# Patient Record
Sex: Female | Born: 1975 | Race: Black or African American | Hispanic: No | Marital: Single | Smoking: Current every day smoker
Health system: Southern US, Community
[De-identification: ages and names within clinical notes are randomized; demographics above are authoritative.]

## PROBLEM LIST (undated history)

## (undated) DIAGNOSIS — F99 Mental disorder, not otherwise specified: Secondary | ICD-10-CM

## (undated) DIAGNOSIS — F329 Major depressive disorder, single episode, unspecified: Secondary | ICD-10-CM

## (undated) DIAGNOSIS — I10 Essential (primary) hypertension: Secondary | ICD-10-CM

## (undated) DIAGNOSIS — F32A Depression, unspecified: Secondary | ICD-10-CM

## (undated) DIAGNOSIS — E7800 Pure hypercholesterolemia, unspecified: Secondary | ICD-10-CM

## (undated) DIAGNOSIS — E119 Type 2 diabetes mellitus without complications: Secondary | ICD-10-CM

## (undated) DIAGNOSIS — Z9119 Patient's noncompliance with other medical treatment and regimen: Secondary | ICD-10-CM

## (undated) DIAGNOSIS — Z91199 Patient's noncompliance with other medical treatment and regimen due to unspecified reason: Secondary | ICD-10-CM

---

## 2013-02-08 ENCOUNTER — Encounter (HOSPITAL_COMMUNITY): Payer: Self-pay | Admitting: *Deleted

## 2013-02-08 ENCOUNTER — Inpatient Hospital Stay (HOSPITAL_COMMUNITY)
Admission: AD | Admit: 2013-02-08 | Discharge: 2013-02-16 | DRG: 896 | Disposition: A | Discharge status: Home / Self Care | Payer: Self-pay | Source: Intra-hospital | Attending: Psychiatry | Admitting: Psychiatry

## 2013-02-08 ENCOUNTER — Emergency Department (HOSPITAL_COMMUNITY)
Admission: EM | Admit: 2013-02-08 | Discharge: 2013-02-08 | Disposition: A | Discharge status: Other Acute Inpatient Hospital | Payer: Self-pay | Attending: Emergency Medicine | Admitting: Emergency Medicine

## 2013-02-08 ENCOUNTER — Encounter (HOSPITAL_COMMUNITY): Payer: Self-pay | Admitting: Emergency Medicine

## 2013-02-08 DIAGNOSIS — F10939 Alcohol use, unspecified with withdrawal, unspecified: Principal | ICD-10-CM | POA: Diagnosis present

## 2013-02-08 DIAGNOSIS — Z5987 Material hardship due to limited financial resources, not elsewhere classified: Secondary | ICD-10-CM

## 2013-02-08 DIAGNOSIS — R4589 Other symptoms and signs involving emotional state: Secondary | ICD-10-CM | POA: Diagnosis present

## 2013-02-08 DIAGNOSIS — F17200 Nicotine dependence, unspecified, uncomplicated: Secondary | ICD-10-CM | POA: Insufficient documentation

## 2013-02-08 DIAGNOSIS — Z794 Long term (current) use of insulin: Secondary | ICD-10-CM | POA: Insufficient documentation

## 2013-02-08 DIAGNOSIS — F411 Generalized anxiety disorder: Secondary | ICD-10-CM | POA: Insufficient documentation

## 2013-02-08 DIAGNOSIS — F3289 Other specified depressive episodes: Secondary | ICD-10-CM | POA: Insufficient documentation

## 2013-02-08 DIAGNOSIS — I10 Essential (primary) hypertension: Secondary | ICD-10-CM | POA: Diagnosis present

## 2013-02-08 DIAGNOSIS — R450 Nervousness: Secondary | ICD-10-CM | POA: Insufficient documentation

## 2013-02-08 DIAGNOSIS — R079 Chest pain, unspecified: Secondary | ICD-10-CM | POA: Diagnosis present

## 2013-02-08 DIAGNOSIS — Z79899 Other long term (current) drug therapy: Secondary | ICD-10-CM | POA: Insufficient documentation

## 2013-02-08 DIAGNOSIS — Z598 Other problems related to housing and economic circumstances: Secondary | ICD-10-CM

## 2013-02-08 DIAGNOSIS — F1020 Alcohol dependence, uncomplicated: Secondary | ICD-10-CM | POA: Diagnosis present

## 2013-02-08 DIAGNOSIS — E1010 Type 1 diabetes mellitus with ketoacidosis without coma: Secondary | ICD-10-CM | POA: Diagnosis present

## 2013-02-08 DIAGNOSIS — R45851 Suicidal ideations: Secondary | ICD-10-CM | POA: Insufficient documentation

## 2013-02-08 DIAGNOSIS — R0602 Shortness of breath: Secondary | ICD-10-CM | POA: Insufficient documentation

## 2013-02-08 DIAGNOSIS — F332 Major depressive disorder, recurrent severe without psychotic features: Secondary | ICD-10-CM | POA: Diagnosis present

## 2013-02-08 DIAGNOSIS — F4310 Post-traumatic stress disorder, unspecified: Secondary | ICD-10-CM | POA: Diagnosis present

## 2013-02-08 DIAGNOSIS — F329 Major depressive disorder, single episode, unspecified: Secondary | ICD-10-CM | POA: Insufficient documentation

## 2013-02-08 DIAGNOSIS — F419 Anxiety disorder, unspecified: Secondary | ICD-10-CM | POA: Diagnosis present

## 2013-02-08 DIAGNOSIS — E119 Type 2 diabetes mellitus without complications: Secondary | ICD-10-CM | POA: Insufficient documentation

## 2013-02-08 DIAGNOSIS — F32A Depression, unspecified: Secondary | ICD-10-CM | POA: Diagnosis present

## 2013-02-08 DIAGNOSIS — F4312 Post-traumatic stress disorder, chronic: Secondary | ICD-10-CM | POA: Diagnosis present

## 2013-02-08 DIAGNOSIS — F609 Personality disorder, unspecified: Secondary | ICD-10-CM | POA: Diagnosis present

## 2013-02-08 DIAGNOSIS — R197 Diarrhea, unspecified: Secondary | ICD-10-CM | POA: Diagnosis present

## 2013-02-08 DIAGNOSIS — IMO0001 Reserved for inherently not codable concepts without codable children: Secondary | ICD-10-CM | POA: Insufficient documentation

## 2013-02-08 DIAGNOSIS — R0789 Other chest pain: Secondary | ICD-10-CM | POA: Insufficient documentation

## 2013-02-08 DIAGNOSIS — F10239 Alcohol dependence with withdrawal, unspecified: Principal | ICD-10-CM | POA: Diagnosis present

## 2013-02-08 DIAGNOSIS — R51 Headache: Secondary | ICD-10-CM | POA: Insufficient documentation

## 2013-02-08 DIAGNOSIS — F1994 Other psychoactive substance use, unspecified with psychoactive substance-induced mood disorder: Secondary | ICD-10-CM | POA: Diagnosis present

## 2013-02-08 DIAGNOSIS — N61 Mastitis without abscess: Secondary | ICD-10-CM | POA: Diagnosis present

## 2013-02-08 DIAGNOSIS — K044 Acute apical periodontitis of pulpal origin: Secondary | ICD-10-CM | POA: Diagnosis present

## 2013-02-08 DIAGNOSIS — G479 Sleep disorder, unspecified: Secondary | ICD-10-CM | POA: Insufficient documentation

## 2013-02-08 HISTORY — DX: Type 2 diabetes mellitus without complications: E11.9

## 2013-02-08 HISTORY — DX: Mental disorder, not otherwise specified: F99

## 2013-02-08 HISTORY — DX: Major depressive disorder, single episode, unspecified: F32.9

## 2013-02-08 HISTORY — DX: Depression, unspecified: F32.A

## 2013-02-08 HISTORY — DX: Essential (primary) hypertension: I10

## 2013-02-08 LAB — COMPREHENSIVE METABOLIC PANEL
ALT: 26 U/L (ref 0–35)
AST: 20 U/L (ref 0–37)
Albumin: 3.5 g/dL (ref 3.5–5.2)
Alkaline Phosphatase: 78 U/L (ref 39–117)
BUN: 5 mg/dL — ABNORMAL LOW (ref 6–23)
CO2: 20 mEq/L (ref 19–32)
Calcium: 9.3 mg/dL (ref 8.4–10.5)
Chloride: 101 mEq/L (ref 96–112)
Creatinine, Ser: 0.41 mg/dL — ABNORMAL LOW (ref 0.50–1.10)
GFR calc Af Amer: >90 mL/min (ref >90)
GFR calc non Af Amer: >90 mL/min (ref >90)
Glucose, Bld: 161 mg/dL — ABNORMAL HIGH (ref 70–99)
Potassium: 3.9 mEq/L (ref 3.5–5.1)
Sodium: 137 mEq/L (ref 135–145)
Total Bilirubin: <0.1 mg/dL — ABNORMAL LOW (ref 0.3–1.2)
Total Protein: 7.3 g/dL (ref 6.0–8.3)

## 2013-02-08 LAB — CBC WITH DIFFERENTIAL/PLATELET
Basophils Absolute: 0.1 10*3/uL (ref 0.0–0.1)
Basophils Relative: 2 % — ABNORMAL HIGH (ref 0–1)
Eosinophils Absolute: 0.2 10*3/uL (ref 0.0–0.7)
Eosinophils Relative: 3 % (ref 0–5)
HCT: 30.2 % — ABNORMAL LOW (ref 36.0–46.0)
Hemoglobin: 9.7 g/dL — ABNORMAL LOW (ref 12.0–15.0)
Lymphocytes Relative: 39 % (ref 12–46)
Lymphs Abs: 2.7 10*3/uL (ref 0.7–4.0)
MCH: 21.8 pg — ABNORMAL LOW (ref 26.0–34.0)
MCHC: 32.1 g/dL (ref 30.0–36.0)
MCV: 68 fL — ABNORMAL LOW (ref 78.0–100.0)
Monocytes Absolute: 0.5 10*3/uL (ref 0.1–1.0)
Monocytes Relative: 7 % (ref 3–12)
Neutro Abs: 3.3 10*3/uL (ref 1.7–7.7)
Neutrophils Relative %: 49 % (ref 43–77)
Platelets: 523 10*3/uL — ABNORMAL HIGH (ref 150–400)
RBC: 4.44 MIL/uL (ref 3.87–5.11)
RDW: 16.7 % — ABNORMAL HIGH (ref 11.5–15.5)
WBC: 6.8 10*3/uL (ref 4.0–10.5)

## 2013-02-08 LAB — ETHANOL: Alcohol, Ethyl (B): 96 mg/dL — ABNORMAL HIGH (ref 0–11)

## 2013-02-08 LAB — GLUCOSE, CAPILLARY
Glucose-Capillary: 156 mg/dL — ABNORMAL HIGH (ref 70–99)
Glucose-Capillary: 208 mg/dL — ABNORMAL HIGH (ref 70–99)
Glucose-Capillary: 278 mg/dL — ABNORMAL HIGH (ref 70–99)

## 2013-02-08 LAB — POCT I-STAT TROPONIN I: Troponin i, poc: 0.01 ng/mL (ref 0.00–0.08)

## 2013-02-08 MED ORDER — INSULIN GLARGINE 100 UNIT/ML ~~LOC~~ SOLN: 40.0000 [IU] | Freq: Two times a day (BID) | SUBCUTANEOUS | Status: DC

## 2013-02-08 MED ORDER — ONDANSETRON HCL 4 MG PO TABS: 4.0000 mg | ORAL_TABLET | Freq: Three times a day (TID) | ORAL | Status: DC | PRN

## 2013-02-08 MED ORDER — ONDANSETRON 4 MG PO TBDP: 4.0000 mg | ORAL_TABLET | Freq: Four times a day (QID) | ORAL | Status: AC | PRN

## 2013-02-08 MED ORDER — VITAMIN B-1 100 MG PO TABS
100.0000 mg | ORAL_TABLET | Freq: Every day | ORAL | Status: DC
  Administered 2013-02-09 – 2013-02-16 (×8): 100 mg via ORAL
  Filled 2013-02-08 (×9): qty 1

## 2013-02-08 MED ORDER — FLUOXETINE HCL 40 MG PO CAPS: 40.0000 mg | ORAL_CAPSULE | Freq: Every day | ORAL | Status: DC

## 2013-02-08 MED ORDER — CHLORDIAZEPOXIDE HCL 25 MG PO CAPS
50.0000 mg | ORAL_CAPSULE | Freq: Once | ORAL | Status: AC
  Administered 2013-02-08: 50 mg via ORAL
  Filled 2013-02-08: qty 2

## 2013-02-08 MED ORDER — CHLORDIAZEPOXIDE HCL 25 MG PO CAPS
25.0000 mg | ORAL_CAPSULE | Freq: Every day | ORAL | Status: AC
  Administered 2013-02-13: 25 mg via ORAL
  Filled 2013-02-08: qty 1

## 2013-02-08 MED ORDER — IBUPROFEN 600 MG PO TABS
600.0000 mg | ORAL_TABLET | ORAL | Status: DC | PRN
  Administered 2013-02-09: 600 mg via ORAL
  Filled 2013-02-08: qty 1

## 2013-02-08 MED ORDER — HYDROXYZINE HCL 25 MG PO TABS
25.0000 mg | ORAL_TABLET | Freq: Four times a day (QID) | ORAL | Status: AC | PRN
  Administered 2013-02-09 – 2013-02-11 (×6): 25 mg via ORAL

## 2013-02-08 MED ORDER — ZOLPIDEM TARTRATE 5 MG PO TABS: 5.0000 mg | ORAL_TABLET | Freq: Every evening | ORAL | Status: DC | PRN

## 2013-02-08 MED ORDER — INSULIN GLARGINE 100 UNIT/ML ~~LOC~~ SOLN
40.0000 [IU] | Freq: Every day | SUBCUTANEOUS | Status: DC
  Administered 2013-02-09 – 2013-02-13 (×4): 40 [IU] via SUBCUTANEOUS

## 2013-02-08 MED ORDER — SULFAMETHOXAZOLE-TMP DS 800-160 MG PO TABS
1.0000 | ORAL_TABLET | Freq: Two times a day (BID) | ORAL | Status: DC
Start: 2013-02-08 — End: 2013-02-09
  Administered 2013-02-08 – 2013-02-09 (×2): 1 via ORAL
  Filled 2013-02-08 (×7): qty 1

## 2013-02-08 MED ORDER — LOPERAMIDE HCL 2 MG PO CAPS
2.0000 mg | ORAL_CAPSULE | ORAL | Status: AC | PRN
  Administered 2013-02-09: 4 mg via ORAL
  Administered 2013-02-09: 2 mg via ORAL

## 2013-02-08 MED ORDER — LISINOPRIL 10 MG PO TABS
5.0000 mg | ORAL_TABLET | Freq: Every day | ORAL | Status: DC
  Administered 2013-02-08: 5 mg via ORAL
  Filled 2013-02-08: qty 0.5

## 2013-02-08 MED ORDER — CHLORDIAZEPOXIDE HCL 25 MG PO CAPS
25.0000 mg | ORAL_CAPSULE | Freq: Four times a day (QID) | ORAL | Status: AC
  Administered 2013-02-09 – 2013-02-10 (×6): 25 mg via ORAL
  Filled 2013-02-08 (×6): qty 1

## 2013-02-08 MED ORDER — CHLORDIAZEPOXIDE HCL 25 MG PO CAPS
25.0000 mg | ORAL_CAPSULE | Freq: Four times a day (QID) | ORAL | Status: AC | PRN
  Administered 2013-02-09 – 2013-02-11 (×4): 25 mg via ORAL
  Filled 2013-02-08 (×3): qty 1

## 2013-02-08 MED ORDER — CHLORDIAZEPOXIDE HCL 25 MG PO CAPS
25.0000 mg | ORAL_CAPSULE | Freq: Three times a day (TID) | ORAL | Status: AC
  Administered 2013-02-10 – 2013-02-11 (×3): 25 mg via ORAL
  Filled 2013-02-08 (×3): qty 1

## 2013-02-08 MED ORDER — TRAZODONE HCL 50 MG PO TABS
50.0000 mg | ORAL_TABLET | Freq: Every evening | ORAL | Status: DC | PRN
  Administered 2013-02-08 – 2013-02-09 (×2): 50 mg via ORAL
  Filled 2013-02-08 (×2): qty 1

## 2013-02-08 MED ORDER — INSULIN ASPART 100 UNIT/ML ~~LOC~~ SOLN
0.0000 [IU] | Freq: Three times a day (TID) | SUBCUTANEOUS | Status: DC
  Administered 2013-02-09: 11 [IU] via SUBCUTANEOUS
  Administered 2013-02-09: 4 [IU] via SUBCUTANEOUS
  Administered 2013-02-09: 11 [IU] via SUBCUTANEOUS
  Administered 2013-02-10: 7 [IU] via SUBCUTANEOUS
  Administered 2013-02-10: 11 [IU] via SUBCUTANEOUS
  Administered 2013-02-11: 3 [IU] via SUBCUTANEOUS
  Administered 2013-02-11: 15 [IU] via SUBCUTANEOUS
  Administered 2013-02-12: 11 [IU] via SUBCUTANEOUS
  Administered 2013-02-12: 7 [IU] via SUBCUTANEOUS
  Administered 2013-02-12: 3 [IU] via SUBCUTANEOUS
  Administered 2013-02-13: 10 [IU] via SUBCUTANEOUS
  Administered 2013-02-13 (×2): 4 [IU] via SUBCUTANEOUS
  Administered 2013-02-13 – 2013-02-14 (×2): 11 [IU] via SUBCUTANEOUS
  Administered 2013-02-14: 4 [IU] via SUBCUTANEOUS
  Administered 2013-02-14 – 2013-02-15 (×2): 11 [IU] via SUBCUTANEOUS
  Administered 2013-02-15: 15 [IU] via SUBCUTANEOUS
  Administered 2013-02-15: 11 [IU] via SUBCUTANEOUS
  Administered 2013-02-16: 7 [IU] via SUBCUTANEOUS
  Administered 2013-02-16: 11 [IU] via SUBCUTANEOUS
  Filled 2013-02-08: qty 0.2

## 2013-02-08 MED ORDER — GABAPENTIN 300 MG PO CAPS
900.0000 mg | ORAL_CAPSULE | Freq: Three times a day (TID) | ORAL | Status: DC
  Administered 2013-02-08 (×2): 900 mg via ORAL
  Filled 2013-02-08 (×3): qty 3

## 2013-02-08 MED ORDER — HYDROXYZINE HCL 25 MG PO TABS
50.0000 mg | ORAL_TABLET | Freq: Four times a day (QID) | ORAL | Status: DC | PRN
  Administered 2013-02-08: 50 mg via ORAL

## 2013-02-08 MED ORDER — MAGNESIUM HYDROXIDE 400 MG/5ML PO SUSP: 30.0000 mL | Freq: Every day | ORAL | Status: DC | PRN

## 2013-02-08 MED ORDER — HYDROXYZINE HCL 50 MG PO TABS
100.0000 mg | ORAL_TABLET | Freq: Two times a day (BID) | ORAL | Status: DC
  Administered 2013-02-08: 100 mg via ORAL
  Filled 2013-02-08 (×4): qty 2

## 2013-02-08 MED ORDER — INSULIN GLARGINE 100 UNIT/ML ~~LOC~~ SOLN
44.0000 [IU] | Freq: Every day | SUBCUTANEOUS | Status: DC
  Administered 2013-02-08 – 2013-02-15 (×8): 44 [IU] via SUBCUTANEOUS
  Filled 2013-02-08 (×2): qty 10

## 2013-02-08 MED ORDER — INSULIN GLARGINE 100 UNIT/ML ~~LOC~~ SOLN
44.0000 [IU] | Freq: Every day | SUBCUTANEOUS | Status: DC
  Filled 2013-02-08: qty 0.44

## 2013-02-08 MED ORDER — HYDROXYZINE HCL 50 MG PO TABS
50.0000 mg | ORAL_TABLET | ORAL | Status: DC | PRN
  Administered 2013-02-08: 50 mg via ORAL

## 2013-02-08 MED ORDER — GABAPENTIN 600 MG PO TABS: 900.0000 mg | ORAL_TABLET | Freq: Three times a day (TID) | ORAL | Status: DC

## 2013-02-08 MED ORDER — SULFAMETHOXAZOLE-TRIMETHOPRIM 800-160 MG PO TABS: 1.0000 | ORAL_TABLET | Freq: Two times a day (BID) | ORAL | Status: DC

## 2013-02-08 MED ORDER — ACETAMINOPHEN 325 MG PO TABS: 650.0000 mg | ORAL_TABLET | Freq: Four times a day (QID) | ORAL | Status: DC | PRN

## 2013-02-08 MED ORDER — LISINOPRIL 5 MG PO TABS
5.0000 mg | ORAL_TABLET | Freq: Every day | ORAL | Status: DC
  Administered 2013-02-09 – 2013-02-16 (×8): 5 mg via ORAL
  Filled 2013-02-08 (×9): qty 1

## 2013-02-08 MED ORDER — ALUM & MAG HYDROXIDE-SIMETH 200-200-20 MG/5ML PO SUSP
30.0000 mL | ORAL | Status: DC | PRN
  Administered 2013-02-11 – 2013-02-15 (×4): 30 mL via ORAL

## 2013-02-08 MED ORDER — THIAMINE HCL 100 MG/ML IJ SOLN: 100.0000 mg | Freq: Once | INTRAMUSCULAR | Status: DC

## 2013-02-08 MED ORDER — GABAPENTIN 300 MG PO CAPS
900.0000 mg | ORAL_CAPSULE | Freq: Three times a day (TID) | ORAL | Status: DC
  Administered 2013-02-08 – 2013-02-09 (×3): 900 mg via ORAL
  Filled 2013-02-08 (×8): qty 3

## 2013-02-08 MED ORDER — FLUOXETINE HCL 20 MG PO CAPS
40.0000 mg | ORAL_CAPSULE | Freq: Every day | ORAL | Status: AC
  Administered 2013-02-09 – 2013-02-15 (×7): 40 mg via ORAL
  Filled 2013-02-08 (×8): qty 2

## 2013-02-08 MED ORDER — ACETAMINOPHEN 325 MG PO TABS
650.0000 mg | ORAL_TABLET | ORAL | Status: DC | PRN
  Administered 2013-02-08: 650 mg via ORAL
  Filled 2013-02-08: qty 2

## 2013-02-08 MED ORDER — PNEUMOCOCCAL VAC POLYVALENT 25 MCG/0.5ML IJ INJ
0.5000 mL | INJECTION | INTRAMUSCULAR | Status: AC
  Administered 2013-02-09: 0.5 mL via INTRAMUSCULAR

## 2013-02-08 MED ORDER — ADULT MULTIVITAMIN W/MINERALS CH
1.0000 | ORAL_TABLET | Freq: Every day | ORAL | Status: DC
  Administered 2013-02-09 – 2013-02-16 (×8): 1 via ORAL
  Filled 2013-02-08 (×9): qty 1

## 2013-02-08 MED ORDER — INSULIN ASPART 100 UNIT/ML ~~LOC~~ SOLN: 40.0000 [IU] | Freq: Two times a day (BID) | SUBCUTANEOUS | Status: DC

## 2013-02-08 MED ORDER — NICOTINE 21 MG/24HR TD PT24
21.0000 mg | MEDICATED_PATCH | Freq: Every day | TRANSDERMAL | Status: DC
  Filled 2013-02-08: qty 14

## 2013-02-08 MED ORDER — METFORMIN HCL 500 MG PO TABS
1000.0000 mg | ORAL_TABLET | Freq: Two times a day (BID) | ORAL | Status: DC
  Administered 2013-02-09 – 2013-02-16 (×15): 1000 mg via ORAL
  Filled 2013-02-08 (×17): qty 2

## 2013-02-08 MED ORDER — METFORMIN HCL 500 MG PO TABS
1000.0000 mg | ORAL_TABLET | Freq: Two times a day (BID) | ORAL | Status: DC
  Administered 2013-02-08: 1000 mg via ORAL
  Filled 2013-02-08 (×2): qty 2

## 2013-02-08 MED ORDER — TRAMADOL HCL 50 MG PO TABS
50.0000 mg | ORAL_TABLET | Freq: Once | ORAL | Status: AC
  Administered 2013-02-08: 50 mg via ORAL
  Filled 2013-02-08: qty 1

## 2013-02-08 MED ORDER — CHLORDIAZEPOXIDE HCL 25 MG PO CAPS
25.0000 mg | ORAL_CAPSULE | ORAL | Status: AC
  Administered 2013-02-11 – 2013-02-12 (×2): 25 mg via ORAL
  Filled 2013-02-08 (×2): qty 1

## 2013-02-08 MED ORDER — INSULIN GLARGINE 100 UNIT/ML ~~LOC~~ SOLN
40.0000 [IU] | Freq: Every day | SUBCUTANEOUS | Status: DC
  Administered 2013-02-08: 40 [IU] via SUBCUTANEOUS
  Filled 2013-02-08: qty 0.4

## 2013-02-08 MED ORDER — LORAZEPAM 1 MG PO TABS
1.0000 mg | ORAL_TABLET | Freq: Three times a day (TID) | ORAL | Status: DC | PRN
  Administered 2013-02-08: 1 mg via ORAL
  Filled 2013-02-08: qty 1

## 2013-02-08 MED ORDER — FLUOXETINE HCL 20 MG PO CAPS
40.0000 mg | ORAL_CAPSULE | Freq: Every day | ORAL | Status: DC
  Administered 2013-02-08: 40 mg via ORAL
  Filled 2013-02-08 (×2): qty 2
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
## 2013-02-08 NOTE — Tx Team (Signed)
Initial Interdisciplinary Treatment Plan  PATIENT STRENGTHS: (choose at least two) Ability for insight Average or above average intelligence Capable of independent living Motivation for treatment/growth  PATIENT STRESSORS: Health problems Substance abuse   PROBLEM LIST: Problem List/Patient Goals Date to be addressed Date deferred Reason deferred Estimated date of resolution  Depression 02/08/13     Substance Abuse 02/08/13                                                DISCHARGE CRITERIA:  Ability to meet basic life and health needs Improved stabilization in mood, thinking, and/or behavior Motivation to continue treatment in a less acute level of care Withdrawal symptoms are absent or subacute and managed without 24-hour nursing intervention  PRELIMINARY DISCHARGE PLAN: Attend aftercare/continuing care group Placement in alternative living arrangements  PATIENT/FAMIILY INVOLVEMENT: This treatment plan has been presented to and reviewed with the patient, Carolyn Ayala, and/or family member, .  The patient and family have been given the opportunity to ask questions and make suggestions.  Chamaine Stankus, [place] 02/08/2013, 8:47 PM
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
## 2013-02-08 NOTE — ED Notes (Signed)
House Coverage aware of need for sitter.  

## 2013-02-08 NOTE — ED Notes (Addendum)
Tele Assessment completed at this time. Awaiting plan of care info from ACT team. Sitter remains at bedside.

## 2013-02-08 NOTE — Progress Notes (Signed)
Adult Psychoeducational Group Note  Date:  02/08/2013 Time:  10:00 PM  Group Topic/Focus:  NA  Participation Level:  Minimal  Participation Quality:  Appropriate  Affect:  Appropriate  Cognitive:  Alert  Insight: Appropriate  Engagement in Group:  Engaged  Modes of Intervention:  Discussion  Additional Comments:    Carolyn Ayala 02/08/2013, 10:00 PM

## 2013-02-08 NOTE — ED Notes (Signed)
Carb modified diet tray ordered.  

## 2013-02-08 NOTE — ED Provider Notes (Signed)
CSN: [id number]     Arrival date & time 02/08/13  0007 History   First MD Initiated Contact with Patient 02/08/13 0154     Chief Complaint  Patient presents with  . Chest Pain  . Suicidal   (Consider location/radiation/quality/duration/timing/severity/associated sxs/prior Treatment) HPI Comments: Patient with new diagnosis of diabetes, is very anxious, stating she doesn't know, what she's doing with this.  She also has suffered with depression for a while.  She's had no hospitalizations for depression.  Tonight, sense with vague chest discomfort, fleeting episode of shortness of breath.  Denies any nausea, diaphoresis at this, time.  She's complaining of headache, and increasing depression.  She, states she was switched from Zoloft and Wellbutrin to Prozac, but she does not feel that this is working.   He saw and within the last 1-2 months.  She has no local physician  Patient is a 37 y.o. female presenting with chest pain. The history is provided by the patient.  Chest Pain Pain location:  Substernal area Pain quality: dull   Pain radiates to:  Does not radiate Pain radiates to the back: no   Pain severity:  No pain Onset quality:  Unable to specify Timing:  Unable to specify Progression:  Unable to specify Chronicity:  Recurrent Relieved by:  Nothing Ineffective treatments:  None tried Associated symptoms: headache   Associated symptoms: no cough, no fever, no nausea, no shortness of breath and not vomiting     Past Medical History  Diagnosis Date  . Hypertension   . Diabetes mellitus without complication    Past Surgical History  Procedure Laterality Date  . Cesarean section     No family history on file. History  Substance Use Topics  . Smoking status: Current Every Day Smoker  . Smokeless tobacco: Not on file  . Alcohol Use: Yes   OB History   Grav Para Term Preterm Abortions TAB SAB Ect Mult Living                 Review of Systems  Constitutional: Negative for  fever and chills.  Respiratory: Negative for cough and shortness of breath.   Cardiovascular: Positive for chest pain.  Gastrointestinal: Negative for nausea and vomiting.  Genitourinary: Negative for dysuria.  Neurological: Positive for headaches.  Psychiatric/Behavioral: Positive for sleep disturbance. The patient is nervous/anxious.   All other systems reviewed and are negative.    Allergies  Review of patient's allergies indicates no known allergies.  Home Medications   Current Outpatient Rx  Name  Route  Sig  Dispense  Refill  . FLUoxetine (PROZAC) 40 MG capsule   Oral   Take 40 mg by mouth daily.         Marland Kitchen gabapentin (NEURONTIN) 600 MG tablet   Oral   Take 900 mg by mouth 3 (three) times daily.         . hydrOXYzine (VISTARIL) 100 MG capsule   Oral   Take 100 mg by mouth 2 (two) times daily.         . insulin glargine (LANTUS) 100 UNIT/ML injection   Subcutaneous   Inject 40-44 Units into the skin 2 (two) times daily. Injects 40 units every morning and injects 44 units every night at bedtime.         . insulin lispro (HUMALOG) 100 UNIT/ML injection   Subcutaneous   Inject into the skin 3 (three) times daily before meals.         Marland Kitchen lisinopril (  PRINIVIL,ZESTRIL) 5 MG tablet   Oral   Take 5 mg by mouth daily.         . metFORMIN (GLUCOPHAGE) 500 MG tablet   Oral   Take 1,000 mg by mouth 2 (two) times daily with a meal.         . traMADol (ULTRAM) 50 MG tablet   Oral   Take 50 mg by mouth every 6 (six) hours as needed for pain.          BP 118/84  Pulse 109  Temp(Src) 98.4 F (36.9 C) (Oral)  Resp 16  SpO2 100%  LMP 01/31/2013 Physical Exam  Nursing note and vitals reviewed. Constitutional: She is oriented to person, place, and time. She appears well-developed and well-nourished.  HENT:  Head: Normocephalic.  Eyes: Pupils are equal, round, and reactive to light.  Neck: Normal range of motion.  Cardiovascular: Normal rate and regular  rhythm.   Pulmonary/Chest: Effort normal and breath sounds normal.  Neurological: She is alert and oriented to person, place, and time.  Skin: Skin is warm.  Psychiatric: Her speech is normal and behavior is normal. Her mood appears anxious. Cognition and memory are normal. She expresses inappropriate judgment. She exhibits a depressed mood. She expresses suicidal ideation. She expresses no suicidal plans.    ED Course  Procedures (including critical care time) Labs Review Labs Reviewed  GLUCOSE, CAPILLARY - Abnormal; Notable for the following:    Glucose-Capillary 156 (*)    All other components within normal limits  ETHANOL - Abnormal; Notable for the following:    Alcohol, Ethyl (B) 96 (*)    All other components within normal limits  CBC WITH DIFFERENTIAL - Abnormal; Notable for the following:    Hemoglobin 9.7 (*)    HCT 30.2 (*)    MCV 68.0 (*)    MCH 21.8 (*)    RDW 16.7 (*)    Platelets 523 (*)    Basophils Relative 2 (*)    All other components within normal limits  COMPREHENSIVE METABOLIC PANEL - Abnormal; Notable for the following:    Glucose, Bld 161 (*)    BUN 5 (*)    Creatinine, Ser 0.41 (*)    Total Bilirubin <0.1 (*)    All other components within normal limits  URINE RAPID DRUG SCREEN (HOSP PERFORMED)  POCT I-STAT TROPONIN I   Imaging Review No results found.  MDM  No diagnosis found.   will have patient evaluated by psych   Arman Filter, NP 02/08/13 [phone]

## 2013-02-08 NOTE — ED Notes (Signed)
The pt c/o a headache med given

## 2013-02-08 NOTE — ED Provider Notes (Signed)
4:15 PM Pt accepted to [facility] by Dr. Dub Mikes. Pt continues to appear well. Will transfer.     Junius Argyle, MD 02/08/13 [phone]

## 2013-02-08 NOTE — ED Notes (Signed)
The pt is asleep and is hard to arrouse.  Angry when asked to get to the bed from the stretcher.  She was moved from pod d to rm 21 in pod c.  Not able to get very much information from here

## 2013-02-08 NOTE — ED Notes (Signed)
Patient states, " my father abandoned me when I was a child and I am having issues with it."  Patient also mentioned being newly diagnosed Diabetes and she is having trouble dealing with having to take insulin on daily basis.  Patient states, " don't want to take insulin for the rest of my life.  I was born here and this came here to die."  Patient mentions being admitted in a hospital recently for DKA.

## 2013-02-08 NOTE — ED Notes (Signed)
Pt is aware of the risks of not getting the EKG, pt still refused EKG

## 2013-02-08 NOTE — ED Notes (Signed)
PT. LEFT ER AFTER REGISTERING  STATED SHE WILL COME BACK AFTER GOING TO THE PARKING LOT.

## 2013-02-08 NOTE — BH Assessment (Signed)
[facility] Assessment Progress Note  Update:  Called [facility], spoke with Amy, pt's nurse @ [phone] and scheduled tele assessment for 0830.

## 2013-02-08 NOTE — BH Assessment (Signed)
[facility] Assessment Progress Note Update:  Pt accepted by Verne Spurr, PA, to Dr. Dub Mikes to bed at [facility] to bed 301-1.  Julieanne Cotton at [facility],  Updated as well as other Mining engineer.  Called pt's nurse, Melissa, @ [phone] to inform her.  [facility] staff to arrange transport to [facility] via security, as pt is voluntary.  [name] nurse is also going to update EDP Romeo Apple at [facility].

## 2013-02-08 NOTE — ED Provider Notes (Signed)
Medical screening examination/treatment/procedure(s) were performed by non-physician practitioner and as supervising physician I was immediately available for consultation/collaboration.  Sunnie Nielsen, MD 02/08/13 [phone]

## 2013-02-08 NOTE — ED Notes (Signed)
Tele-assessment ongoing at this time. 

## 2013-02-08 NOTE — ED Notes (Signed)
Pt up to the br sitter at there bedside

## 2013-02-08 NOTE — ED Notes (Signed)
PT. REPORTS MID CHEST PAIN ONSET THIS EVENING WITH SOB , DENIES NAUSEA OR DIAPHORESIS , PT. ALSO REPORTED SUICIDAL IDEATION -= PLANS TO OVER DOSE ON INSULIN . CHARGE NURSE NOTIFIED FOR SITTER . PAPER SCRUBS GIVEN TO PT. WILL CALL SECURITY TO WAND PT.

## 2013-02-08 NOTE — ED Notes (Signed)
Patient name called X2 w/ no answer.

## 2013-02-08 NOTE — ED Notes (Signed)
Patient received meal tray 

## 2013-02-08 NOTE — ED Notes (Signed)
CHARGE NURSE NOTIFIED BY NURSE FIRST FOR SITTER , PAPER SCRUBS GIVEN TO PT. SECURITY NOTIFIED TO WAND PT.

## 2013-02-09 DIAGNOSIS — F332 Major depressive disorder, recurrent severe without psychotic features: Secondary | ICD-10-CM

## 2013-02-09 DIAGNOSIS — F1994 Other psychoactive substance use, unspecified with psychoactive substance-induced mood disorder: Secondary | ICD-10-CM

## 2013-02-09 DIAGNOSIS — F4310 Post-traumatic stress disorder, unspecified: Secondary | ICD-10-CM

## 2013-02-09 DIAGNOSIS — F1010 Alcohol abuse, uncomplicated: Secondary | ICD-10-CM

## 2013-02-09 LAB — GLUCOSE, CAPILLARY
Glucose-Capillary: 277 mg/dL — ABNORMAL HIGH (ref 70–99)
Glucose-Capillary: 185 mg/dL — ABNORMAL HIGH (ref 70–99)
Glucose-Capillary: 300 mg/dL — ABNORMAL HIGH (ref 70–99)
Glucose-Capillary: 106 mg/dL — ABNORMAL HIGH (ref 70–99)

## 2013-02-09 LAB — HEMOGLOBIN A1C
Hgb A1c MFr Bld: 10 % — ABNORMAL HIGH (ref <5.7)
Mean Plasma Glucose: 240 mg/dL — ABNORMAL HIGH (ref <117)

## 2013-02-09 MED ORDER — HYDROCORTISONE 1 % EX CREA
TOPICAL_CREAM | Freq: Three times a day (TID) | CUTANEOUS | Status: DC
  Administered 2013-02-09 – 2013-02-10 (×3): 1 via TOPICAL
  Administered 2013-02-13 – 2013-02-16 (×6): via TOPICAL
  Filled 2013-02-09: qty 1.5
  Filled 2013-02-09: qty 28

## 2013-02-09 MED ORDER — GLUCERNA SHAKE PO LIQD
237.0000 mL | Freq: Three times a day (TID) | ORAL | Status: DC
  Administered 2013-02-09 – 2013-02-16 (×15): 237 mL via ORAL
  Filled 2013-02-09: qty 237

## 2013-02-09 MED ORDER — MIRTAZAPINE 15 MG PO TABS
15.0000 mg | ORAL_TABLET | Freq: Every day | ORAL | Status: DC
  Administered 2013-02-09 – 2013-02-10 (×2): 15 mg via ORAL
  Filled 2013-02-09 (×4): qty 1

## 2013-02-09 MED ORDER — GABAPENTIN 400 MG PO CAPS
1000.0000 mg | ORAL_CAPSULE | Freq: Three times a day (TID) | ORAL | Status: DC
  Administered 2013-02-09 – 2013-02-15 (×17): 1000 mg via ORAL
  Filled 2013-02-09 (×21): qty 2

## 2013-02-09 MED ORDER — AMOXICILLIN-POT CLAVULANATE 500-125 MG PO TABS
1.0000 | ORAL_TABLET | Freq: Two times a day (BID) | ORAL | Status: DC
  Administered 2013-02-09 – 2013-02-16 (×14): 500 mg via ORAL
  Filled 2013-02-09 (×8): qty 1
  Filled 2013-02-09: qty 8
  Filled 2013-02-09: qty 1
  Filled 2013-02-09: qty 8
  Filled 2013-02-09 (×6): qty 1

## 2013-02-09 MED ORDER — IBUPROFEN 600 MG PO TABS
600.0000 mg | ORAL_TABLET | Freq: Three times a day (TID) | ORAL | Status: DC
  Administered 2013-02-09 – 2013-02-10 (×4): 600 mg via ORAL
  Filled 2013-02-09 (×8): qty 1

## 2013-02-09 MED ORDER — DIPHENHYDRAMINE HCL 12.5 MG/5ML PO ELIX
12.5000 mg | ORAL_SOLUTION | Freq: Three times a day (TID) | ORAL | Status: DC | PRN
  Administered 2013-02-09 – 2013-02-14 (×8): 12.5 mg via ORAL
  Filled 2013-02-09 (×2): qty 5

## 2013-02-09 MED ORDER — BENZOCAINE 10 % MT GEL: Freq: Four times a day (QID) | OROMUCOSAL | Status: DC | PRN

## 2013-02-09 NOTE — H&P (Addendum)
  Pt was seen by me today and I agree with the key elements documented in H&P.    Will start augmentin for mastitis. Will increase neurontin for pain.

## 2013-02-09 NOTE — Progress Notes (Signed)
Patient ID: Carolyn Ayala, female   DOB: 1975-12-10, 37 y.o.   MRN: [id number] D)  Was rather irritable this evening after admission,  Multiple c/o's, feeling anxious, had gone all day taking only one ativan in the ED, began demanding something for her anxiety and detox.  Orders obtained and started on the librium protocol, stated her gums were sore and throbbing, refused tylenol, wanted tramadol or something stronger, but agreed to try motrin if order could be obtained. Became more pleasant once orders were obtained and librium protocol was started, with 50 mg loading dose. CBG was 208, had been eating cookies.  Asked if she was familiar with diet and nutrition with diabetes, stated she had never had any teaching.    Denies thoughts of self harm, able to contract for safety.   A)  Will continue to monitor for safety, continue POC, request consult for diabetic teaching R)  Safety maintained.   Addendum:  02:00  Pt woke up after being asleep and having a diarrhea stool in bed.   Bed was cleaned, pt took quick bath, imodium given as well as additional librium and repeat trazodone.  Pt requested clothing as she only had the paper scrubs she was wearing.  Some clothing obtained from the clothes closet, as pt refused to wear gowns.

## 2013-02-09 NOTE — Progress Notes (Signed)
D   Pt has been pleasant and appropriate  She was somewhat irritable first thing this morning but has calmed after taking detox medications   She reports feeling depressed since she has recently found out she has diabetes  She was eating ice cream for snack and it has been reported that she tends to eat lots of carbs  Pt said she does not know how to eat for her diabetes and requested information about the disease in general   She attended groups but mostly slept  She interacts well with others and can have loose boundaries at times   She has not had any more diarrhea A   Verbal support given  Medications administered and effectiveness monitored  Q 15 min checks  Provided handouts for pt regarding general information about her disease and dietary options   Ordered diabetes consult R   Pt safe at present
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
## 2013-02-09 NOTE — Progress Notes (Signed)
Adult Psychoeducational Group Note  Date:  02/09/2013 Time:  3:53 PM  Group Topic/Focus:  Healthy Communication:   The focus of this group is to discuss communication, barriers to communication, as well as healthy ways to communicate with others.  Participation Level:  Minimal  Participation Quality:  Attentive and Resistant  Affect:  Appropriate  Cognitive:  Appropriate  Insight: Improving  Engagement in Group:  Resistant  Modes of Intervention:  Activity and Discussion  Additional Comments:  Pt refused to participate in activity.   Tora Perches N 02/09/2013, 3:53 PM

## 2013-02-09 NOTE — [facility] Group Notes (Addendum)
[facility] Group Notes:  (Clinical Social Work)  02/09/2013     10-11AM  Summary of Progress/Problems:   The main focus of today's process group was for the patient to identify ways in which they have in the past sabotaged their own recovery. Motivational Interviewing was utilized to ask the group members what they get out of their substance use, and what reasons they may have for wanting to change.  The Stages of Change were explained using a handout, and patients identified where they currently are with regard to stages of change.  The patient expressed that she self sabotages with alcohol because she has such deep anger, and wants to forget her pain.  However, she believes that her 9yo son is suffering because of her addiction and she wants to get on medications and return to a normal life.  She is in the Preparation stage.  Type of Therapy:  Group Therapy - Process   Participation Level:  Active  Participation Quality:  Attentive and Sharing  Affect:  Appropriate  Cognitive:  Alert and Appropriate  Insight:  Developing/Improving  Engagement in Therapy:  Engaged  Modes of Intervention:  Education, Support and Processing, Motivational Interviewing  Ambrose Mantle, LCSW 02/09/2013, 12:27 PM
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
## 2013-02-09 NOTE — [facility] Group Notes (Signed)
[facility] Group Notes:  (Nursing/MHT/Case Management/Adjunct)  Date:  02/09/2013  Time:  1:06 PM  Type of Therapy:  Psychoeducational Skills  Participation Level:  Active  Participation Quality:  Appropriate  Affect:  Appropriate  Cognitive:  Appropriate  Insight:  Appropriate  Engagement in Group:  Engaged  Modes of Intervention:  Problem-solving  Summary of Progress/Problems: Pt attended self inventory group, and engaged in treatment. Pt reported in group that she was having a much better day, and that she had no concerns.   Jacquelyne Balint Shanta 02/09/2013, 1:06 PM
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
## 2013-02-09 NOTE — Progress Notes (Signed)
Psychoeducational Group Note  Date:  02/09/2013 Time:  0945 am  Group Topic/Focus:  Identifying Needs:   The focus of this group is to help patients identify their personal needs that have been historically problematic and identify healthy behaviors to address their needs.  Participation Level:  Minimal  Participation Quality:  Inattentive  Affect:  Flat  Cognitive:  Alert  Insight:  Limited  Engagement in Group:  Limited  Additional Comments:    Andrena Mews 02/09/2013,3:46 PM

## 2013-02-10 DIAGNOSIS — F331 Major depressive disorder, recurrent, moderate: Secondary | ICD-10-CM

## 2013-02-10 DIAGNOSIS — F411 Generalized anxiety disorder: Secondary | ICD-10-CM

## 2013-02-10 LAB — GLUCOSE, CAPILLARY
Glucose-Capillary: 217 mg/dL — ABNORMAL HIGH (ref 70–99)
Glucose-Capillary: 94 mg/dL (ref 70–99)
Glucose-Capillary: 283 mg/dL — ABNORMAL HIGH (ref 70–99)
Glucose-Capillary: 82 mg/dL (ref 70–99)

## 2013-02-10 MED ORDER — IBUPROFEN 800 MG PO TABS
800.0000 mg | ORAL_TABLET | Freq: Three times a day (TID) | ORAL | Status: DC
  Administered 2013-02-10 – 2013-02-16 (×19): 800 mg via ORAL
  Filled 2013-02-10 (×23): qty 1

## 2013-02-10 MED ORDER — NALTREXONE HCL 50 MG PO TABS
50.0000 mg | ORAL_TABLET | Freq: Every day | ORAL | Status: DC
  Administered 2013-02-10 – 2013-02-16 (×6): 50 mg via ORAL
  Filled 2013-02-10 (×6): qty 1
  Filled 2013-02-10: qty 14
  Filled 2013-02-10 (×2): qty 1

## 2013-02-10 NOTE — Progress Notes (Signed)
[facility] Group Notes:  (Nursing/MHT/Case Management/Adjunct)  Date:  02/09/2013   Time:  2100 Type of Therapy:  wrap up group  Participation Level:  Minimal  Participation Quality:  Appropriate and Supportive  Affect:  Appropriate  Cognitive:  Appropriate  Insight:  Appropriate  Engagement in Group:  Limited  Modes of Intervention:  Clarification, Education and Support  Summary of Progress/Problems:  Carolyn Ayala 02/10/2013, 3:37 AM

## 2013-02-10 NOTE — Progress Notes (Signed)
Patient ID: Carolyn Ayala, female   DOB: [birth year], 37 y.o.   MRN: [id number] D: Patient complaining of withdrawal symptoms.  Patient was admin. Librium prn X1.  She continues to have mouth pain due to missing teeth.  Patient has been pleasant and cooperative.  She exhibits med seeking behavior. She denies any SI/HI/AVH. A: Continue to monitor medication management and MD orders.  Safety checks completed every 15 minutes per protocol. R: Patient's behavior has been appropriate.
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
## 2013-02-10 NOTE — [facility] Group Notes (Signed)
[facility] Group Notes:  (Clinical Social Work)  02/10/2013  10:00-11:00AM  Summary of Progress/Problems:   The main focus of today's process group was to   identify the patient's current support system and decide on other supports that can be put in place.  The picture on workbook was used to discuss why additional supports are needed, and a hand-out was distributed with four definitions/levels of support, then used to talk about how patients have given and received all different kinds of support.  An emphasis was placed on using counselor, doctor, therapy groups, 12-step groups, and problem-specific support groups to expand supports.  The patient was present in group but did not contribute to discussion, appeared to not be listening, and even talked out loud as though to the door at one point.  Type of Therapy:  Process Group with Motivational Interviewing  Participation Level:  Minimal  Participation Quality:  Inattentive  Affect:  Depressed and Flat  Cognitive:  Alert  Insight:  Lacking  Engagement in Therapy:  Lacking  Modes of Intervention:   Education, Support and Processing, Activity  Ambrose Mantle, LCSW 02/10/2013, 12:59 PM

## 2013-02-10 NOTE — Progress Notes (Signed)
[facility] MD Progress Note  02/10/2013 10:12 AM Carolyn Ayala  MRN:  [id number] Subjective:  Carolyn Ayala reports that she is feeling much better today as she was able to sleep through the night on Remeron. She endorses continued withdrawal symptoms including tremors, as well as cravings for alcohol. She is willing to take a medication to help reduce cravings. She denies any suicidal or homicidal ideation. She denies any auditory or visual hallucinations.  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 Hypertension   . Diabetes mellitus without complication   . Mental disorder   . Depression     ADL's:  Intact  Sleep: Good  Appetite:  Good  Suicidal Ideation:  Patient denies any thought, plan, or intent Homicidal Ideation:  Patient denies any thought, plan, or intent AEB (as evidenced by):  Psychiatric Specialty Exam: Review of Systems  Constitutional: Negative.   HENT: Negative.   Eyes: Negative.   Respiratory: Negative.   Cardiovascular: Negative.   Gastrointestinal: Negative.   Genitourinary: Negative.   Musculoskeletal: Negative.   Neurological: Negative.   Endo/Heme/Allergies: Negative.     Blood pressure 115/78, pulse 111, temperature 97.8 F (36.6 C), temperature source Oral, resp. rate 18, height 5\' 2"  (1.575 m), weight 71.215 kg (157 lb), last menstrual period 01/31/2013, SpO2 100.00%.Body mass index is 28.71 kg/(m^2).  General Appearance: Casual  Eye Contact::  Good  Speech:  Clear and Coherent  Volume:  Normal  Mood:   Anxious and Dysphoric  Affect:  Congruent  Thought Process:  Linear  Orientation:  Full (Time, Place, and Person)  Thought Content:  WDL  Suicidal Thoughts:  No  Homicidal Thoughts:  No  Memory:  Immediate;   Fair Recent;   Fair Remote;   Fair  Judgement:  Impaired  Insight:  Lacking  Psychomotor Activity:  Normal  Concentration:  Good  Recall:  Good  Akathisia:  No  Handed:    AIMS (if indicated):     Assets:  Communication Skills Desire for Improvement  Sleep:  Number of Hours: 5   Current Medications: Current Facility-Administered Medications  Medication Dose Route Frequency Provider Last Rate Last Dose  . acetaminophen (TYLENOL) tablet 650 mg  650 mg Oral Q6H PRN Court Joy, PA-C      . alum & mag hydroxide-simeth (MAALOX/MYLANTA) 200-200-20 MG/5ML suspension 30 mL  30 mL Oral Q4H PRN Court Joy, PA-C      . amoxicillin-clavulanate (AUGMENTIN) 500-125 MG per tablet 500 mg  1 tablet Oral BID [name], MD   500 mg at 02/10/13 0753  . benzocaine (ORAJEL) 10 % mucosal gel   Mouth/Throat QID PRN Jorje Guild, PA-C      . chlordiazePOXIDE (LIBRIUM) capsule 25 mg  25 mg Oral Q6H PRN Court Joy, PA-C   25 mg at 02/10/13 1005  . chlordiazePOXIDE (LIBRIUM) capsule 25 mg  25 mg Oral TID Court Joy, PA-C       Followed by  . [START ON 02/11/2013] chlordiazePOXIDE (LIBRIUM) capsule 25 mg  25 mg Oral BH-qamhs Court Joy, PA-C       Followed by  . [START ON  02/13/2013] chlordiazePOXIDE (LIBRIUM) capsule 25 mg  25 mg Oral Daily Court Joy, PA-C      . diphenhydrAMINE (BENADRYL) 12.5 MG/5ML elixir 12.5 mg  12.5 mg Oral Q8H PRN Jorje Guild, PA-C   12.5 mg at 02/09/13 2321  . feeding supplement (GLUCERNA SHAKE) liquid 237 mL  237 mL Oral TID BM Jorje Guild, PA-C   237 mL at 02/10/13 1006  . FLUoxetine (PROZAC) capsule 40 mg  40 mg Oral Daily Court Joy, PA-C   40 mg at 02/10/13 0754  . gabapentin (NEURONTIN) capsule 1,000 mg  1,000 mg Oral TID [name], MD   1,000  mg at 02/10/13 0753  . hydrocortisone cream 1 %   Topical TID Jorje Guild, PA-C   1 application at 02/09/13 1715  . hydrOXYzine (ATARAX/VISTARIL) tablet 25 mg  25 mg Oral Q6H PRN Court Joy, PA-C   25 mg at 02/10/13 0755  . ibuprofen (ADVIL,MOTRIN) tablet 800 mg  800 mg Oral TID Jorje Guild, PA-C      . insulin aspart (novoLOG) injection 0-20 Units  0-20 Units Subcutaneous TID WC Court Joy, PA-C   7 Units at 02/10/13 0703  . insulin glargine (LANTUS) injection 40 Units  40 Units Subcutaneous Daily Court Joy, PA-C   40 Units at 02/10/13 0801   And  . insulin glargine (LANTUS) injection 44 Units  44 Units Subcutaneous QHS Court Joy, PA-C   44 Units at 02/09/13 2315  . lisinopril (PRINIVIL,ZESTRIL) tablet 5 mg  5 mg Oral Daily Court Joy, PA-C   5 mg at 02/10/13 0754  . loperamide (IMODIUM) capsule 2-4 mg  2-4 mg Oral PRN Court Joy, PA-C   2 mg at 02/09/13 1610  . magnesium hydroxide (MILK OF MAGNESIA) suspension 30 mL  30 mL Oral Daily PRN Court Joy, PA-C      . metFORMIN (GLUCOPHAGE) tablet 1,000 mg  1,000 mg Oral BID WC Court Joy, PA-C   1,000 mg at 02/10/13 0754  . mirtazapine (REMERON) tablet 15 mg  15 mg Oral QHS Jorje Guild, PA-C   15 mg at 02/09/13 2113  . multivitamin with minerals tablet 1 tablet  1 tablet Oral Daily Court Joy, PA-C   1 tablet at 02/10/13 9604  . naltrexone (DEPADE) tablet 50 mg  50 mg Oral Daily Jorje Guild, PA-C      . ondansetron (ZOFRAN-ODT) disintegrating tablet 4 mg  4 mg Oral Q6H PRN Court Joy, PA-C      . thiamine (B-1) injection 100 mg  100 mg Intramuscular Once Court Joy, PA-C      . thiamine (VITAMIN B-1) tablet 100 mg  100 mg Oral Daily Court Joy, PA-C   100 mg at 02/10/13 5409    Lab Results:  Results for orders placed during the hospital encounter of 02/08/13 (from the past 48 hour(s))  GLUCOSE, CAPILLARY     Status: Abnormal   Collection Time    02/08/13 10:36 PM      Result Value Range    Glucose-Capillary 208 (*) 70 - 99 mg/dL  GLUCOSE, CAPILLARY     Status: Abnormal   Collection Time    02/09/13  6:29 AM      Result Value Range   Glucose-Capillary 277 (*) 70 - 99 mg/dL  HEMOGLOBIN W1X     Status: Abnormal   Collection Time    02/09/13  6:30 AM      Result  Value Range   Hemoglobin A1C 10.0 (*) <5.7 %   Comment: (NOTE)                                                                               According to the ADA Clinical Practice Recommendations for 2011, when     HbA1c is used as a screening test:      >=6.5%   Diagnostic of Diabetes Mellitus               (if abnormal result is confirmed)     5.7-6.4%   Increased risk of developing Diabetes Mellitus     References:Diagnosis and Classification of Diabetes Mellitus,Diabetes     Care,2011,34(Suppl 1):S62-S69 and Standards of Medical Care in             Diabetes - 2011,Diabetes Care,2011,34 (Suppl 1):S11-S61.   Mean Plasma Glucose 240 (*) <117 mg/dL   Comment: Performed at [facility]  GLUCOSE, CAPILLARY     Status: Abnormal   Collection Time    02/09/13 11:57 AM      Result Value Range   Glucose-Capillary 185 (*) 70 - 99 mg/dL   Comment 1 Notify RN    GLUCOSE, CAPILLARY     Status: Abnormal   Collection Time    02/09/13  4:43 PM      Result Value Range   Glucose-Capillary 300 (*) 70 - 99 mg/dL   Comment 1 Notify RN     Comment 2 Documented in Chart    GLUCOSE, CAPILLARY     Status: Abnormal   Collection Time    02/09/13  9:32 PM      Result Value Range   Glucose-Capillary 106 (*) 70 - 99 mg/dL   Comment 1 Notify RN     Comment 2 Documented in Chart      Physical Findings: AIMS: Facial and Oral Movements Muscles of Facial Expression: None, normal Lips and Perioral Area: None, normal Jaw: None, normal Tongue: None, normal,Extremity Movements Upper (arms, wrists, hands, fingers): None, normal Lower (legs, knees, ankles, toes): None, normal, Trunk Movements Neck, shoulders, hips: None,  normal, Overall Severity Severity of abnormal movements (highest score from questions above): None, normal Incapacitation due to abnormal movements: None, normal Patient's awareness of abnormal movements (rate only patient's report): No Awareness, Dental Status Current problems with teeth and/or dentures?: Yes Does patient usually wear dentures?: No  CIWA:  CIWA-Ar Total: 11 COWS:     Treatment Plan Summary: Daily contact with patient to assess and evaluate symptoms and progress in treatment Medication management  Plan: We will continue her current plan of care and initiate naltrexone 50 mg daily to target her cravings for alcohol. She has a pending diabetic consult to discuss diet.  Medical Decision Making Problem Points:  Established problem, stable/improving (1) and Review of psycho-social stressors (1) Data Points:  Review or order clinical lab tests (1) Review of medication regiment & side effects (2) Review of new medications or change in dosage (2)  I certify that inpatient services furnished can reasonably be expected to improve the patient's condition.   Claxton Levitz 02/10/2013, 10:12 AM

## 2013-02-10 NOTE — Progress Notes (Signed)
NUTRITION ASSESSMENT  Pt identified as at risk on the Malnutrition Screen Tool  INTERVENTION: 1. Educated patient on the importance of nutrition and encouraged intake of food and beverages.   2. Discussed weight goals. 3. Supplements: MVI and thiamine daily, Glucerna shake tid 4.  Educated patient on DM diet and Consistent carbs.  Provided information on the plate method and cho counting.  Patient able to verbalize.  Teach back method used.  Patient would benefit from outpatient DM class.  NUTRITION DIAGNOSIS: Food and nutrition related knowledge deficit related to no prior need AEB newly dx diabetes.  Goal: Patient to verbalize guidelines for diabetic diet.  Monitor:  For questions.  Assessment:  Patient admitted with SI with plan to OD on her insulin. MDD, Personality disorder, PTSD.   Drinks 8 40 oz beers daily.  Separated, lives with niece, unemployed.    Met with patient who is overwhelmed with DM and other issues in her life.  Discussed how she took her insulin at home.  At another point in the conversation patient stated that someone told her that if she ate better and exercised a lot she could quit taking her insulin and diabetes meds.  Patient verbalized how sick she was on admit with new DM.  Then after discussion patient verbalized that she needed to continue taking all of her meds until a point that an MD tells her that she does not need to.    Fair intake of meals currently.   37 y.o. female  Height: Ht Readings from Last 1 Encounters:  02/08/13 5\' 2"  (1.575 m)    Weight: Wt Readings from Last 1 Encounters:  02/08/13 157 lb (71.215 kg)    Weight Hx: Wt Readings from Last 10 Encounters:  02/08/13 157 lb (71.215 kg)    BMI:  Body mass index is 28.71 kg/(m^2). Pt meets criteria for overweight based on current BMI.  Estimated Nutritional Needs: Kcal: 25-30 kcal/kg Protein: > 1 gram protein/kg Fluid: 1 ml/kcal  Diet Order: Carb Control Pt is also offered  choice of unit snacks mid-morning and mid-afternoon.  Pt is eating as desired.    Lab results and medications reviewed.   Oran Rein, RD, LDN Clinical Inpatient Dietitian Pager:  [phone] Weekend and after hours pager:  [phone]

## 2013-02-10 NOTE — [facility] Group Notes (Signed)
[facility] Group Notes:  (Nursing/MHT/Case Management/Adjunct)  Date:  02/10/2013  Time:  10:29 AM  Type of Therapy: Psychoeducational Skills  Participation Level: Active  Participation Quality: Appropriate  Affect: Appropriate  Cognitive: Appropriate  Insight: Appropriate  Engagement in Group: Engaged  Modes of Intervention: Problem-solving  Summary of Progress/Problems: Pt attended self inventory group, and engaged in treatment.  Avalie Oconnor Shanta 02/10/2013, 10:29 AM 

## 2013-02-10 NOTE — Progress Notes (Signed)
Patient ID: Carolyn Ayala, female   DOB: 07-10-1975, 37 y.o.   MRN: [id number] D)  Has been up to the med window several times this evening, med seeking, even though she seems drowsy.  Was eating corn chips and other snacks, offered her a salad instead, which she accepted.  Has been arguing with roommate, both of them seem somewhat irritable.  Pt had wanted to sleep in quiet room but someone was already sleeping there, went back to her room and trying to avoid interaction.  States is doing a little better with detox,  still feeling anxious, but no further diarrhea this evening.  Pain in gums seems to be usually around a 5 today, improving,  Antibiotic was changed to augmentin, order was obtained to d/c bactrim as pt had blamed it for the problems last night with diarrhea.  Hoping to get a better night's sleep with remeron tonight. A)  Will continue to monitor for safety, continue POC  R)  Safety maintained.

## 2013-02-11 DIAGNOSIS — F39 Unspecified mood [affective] disorder: Secondary | ICD-10-CM

## 2013-02-11 DIAGNOSIS — F411 Generalized anxiety disorder: Secondary | ICD-10-CM

## 2013-02-11 LAB — GLUCOSE, CAPILLARY
Glucose-Capillary: 71 mg/dL (ref 70–99)
Glucose-Capillary: 141 mg/dL — ABNORMAL HIGH (ref 70–99)
Glucose-Capillary: 342 mg/dL — ABNORMAL HIGH (ref 70–99)
Glucose-Capillary: 186 mg/dL — ABNORMAL HIGH (ref 70–99)

## 2013-02-11 MED ORDER — MIRTAZAPINE 30 MG PO TABS
30.0000 mg | ORAL_TABLET | Freq: Every day | ORAL | Status: DC
  Administered 2013-02-11 – 2013-02-15 (×5): 30 mg via ORAL
  Filled 2013-02-11 (×3): qty 1
  Filled 2013-02-11: qty 14
  Filled 2013-02-11 (×2): qty 1

## 2013-02-11 MED ORDER — HYDROCERIN EX CREA
TOPICAL_CREAM | Freq: Two times a day (BID) | CUTANEOUS | Status: DC
  Administered 2013-02-11 – 2013-02-16 (×4): via TOPICAL
  Filled 2013-02-11 (×2): qty 113

## 2013-02-11 NOTE — Progress Notes (Signed)
Patient ID: Carolyn Ayala, female   DOB: 1975/09/03, 37 y.o.   MRN: [id number] [facility] has been up and to group at times she is disruptive verbally in group and to peers. Peers do not want to be around her. She denies withdrawal symptoms. She refused her 8AM insulin d/t cbg being low.

## 2013-02-11 NOTE — Progress Notes (Signed)
Patient did attend the first half of the evening speaker AA meeting.  

## 2013-02-11 NOTE — Progress Notes (Signed)
Adult Psychoeducational Group Note  Date:  02/11/2013 Time:  6:35 PM  Group Topic/Focus:  Dimensions of Wellness:   The focus of this group is to introduce the topic of wellness and discuss the role each dimension of wellness plays in total health.  Participation Level:  Minimal  Participation Quality:  Attentive  Affect:  Appropriate  Cognitive:  Oriented  Insight: Lacking  Engagement in Group:  Improving  Modes of Intervention:  Education, Socialization and Support  Additional Comments:    Reynolds Bowl 02/11/2013, 6:35 PM
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
## 2013-02-12 LAB — GLUCOSE, CAPILLARY
Glucose-Capillary: 223 mg/dL — ABNORMAL HIGH (ref 70–99)
Glucose-Capillary: 131 mg/dL — ABNORMAL HIGH (ref 70–99)
Glucose-Capillary: 260 mg/dL — ABNORMAL HIGH (ref 70–99)
Glucose-Capillary: 232 mg/dL — ABNORMAL HIGH (ref 70–99)
Glucose-Capillary: 405 mg/dL — ABNORMAL HIGH (ref 70–99)

## 2013-02-12 MED ORDER — BACITRACIN-NEOMYCIN-POLYMYXIN OINTMENT TUBE
TOPICAL_OINTMENT | Freq: Two times a day (BID) | CUTANEOUS | Status: DC
  Administered 2013-02-13 – 2013-02-16 (×4): via TOPICAL
  Filled 2013-02-12: qty 15

## 2013-02-12 MED ORDER — HYDROXYZINE HCL 50 MG PO TABS
50.0000 mg | ORAL_TABLET | Freq: Four times a day (QID) | ORAL | Status: DC | PRN
  Administered 2013-02-12 – 2013-02-13 (×2): 50 mg via ORAL

## 2013-02-12 MED ORDER — INSULIN ASPART 100 UNIT/ML ~~LOC~~ SOLN
4.0000 [IU] | Freq: Three times a day (TID) | SUBCUTANEOUS | Status: DC
  Administered 2013-02-12 – 2013-02-16 (×12): 4 [IU] via SUBCUTANEOUS

## 2013-02-12 MED ORDER — INSULIN ASPART 100 UNIT/ML ~~LOC~~ SOLN
10.0000 [IU] | Freq: Once | SUBCUTANEOUS | Status: AC
  Administered 2013-02-12: 10 [IU] via SUBCUTANEOUS

## 2013-02-12 MED ORDER — HYDROXYZINE HCL 25 MG PO TABS
25.0000 mg | ORAL_TABLET | Freq: Four times a day (QID) | ORAL | Status: DC | PRN
  Administered 2013-02-12 (×2): 25 mg via ORAL

## 2013-02-12 NOTE — Progress Notes (Signed)
The focus of this group is to educate the patient on the purpose and policies of crisis stabilization and provide a format to answer questions about their admission.  The group details unit policies and expectations of patients while admitted. Patient attended the 0900 group and was engaging.

## 2013-02-12 NOTE — Progress Notes (Signed)
Patient resting quietly with eyes closed. Respirations even and unlabored. No distress noted, Q 15 minute check continues as ordered to maintain safety.  

## 2013-02-12 NOTE — Progress Notes (Signed)
Adult Psychoeducational Group Note  Date:  02/12/2013 Time:  2:36 PM  Group Topic/Focus:  Recovery Goals:   The focus of this group is to identify appropriate goals for recovery and establish a plan to achieve them.  Participation Level:  Did Not Attend  Cathlean Cower 02/12/2013, 2:36 PM

## 2013-02-12 NOTE — Progress Notes (Signed)
D:  Patient up and visible in the milieu most of the shift.  Has attended some groups, but is not always able to participate or pay attention to what is going on.  She has been argumentative and intrusive at times, especially during group time.  She continues to endorse high levels of depression and hopelessness.  She also endorses intermittent suicidal thoughts.  She is able to commit to seek out staff if feeling unsafe.   A:  Medications given as per schedule.  Encouraged patient to attend groups and to be respectful of others.   R:  Mostly cooperative, but argumentative at times.  Interacting with staff.  Smiling and joking with peers.  Declined Glucerna at 1400 stating she wanted a real snack like the rest of the patients.

## 2013-02-12 NOTE — Progress Notes (Addendum)
Inpatient Diabetes Program Recommendations  AACE/ADA: New Consensus Statement on Inpatient Glycemic Control (2013)  Target Ranges:  Prepandial:   less than 140 mg/dL      Peak postprandial:   less than 180 mg/dL (1-2 hours)      Critically ill patients:  140 - 180 mg/dL   Reason for Visit: Uncontrolled Diabetes  Results for JENTRY, MCQUEARY (MRN [id number]) as of 02/12/2013 12:11  Ref. Range 02/09/2013 06:30  Hemoglobin A1C Latest Range: <5.7 % 10.0 (H)  Results for BRIAH, NARY (MRN [id number]) as of 02/12/2013 12:11  Ref. Range 02/10/2013 06:02 02/10/2013 11:59 02/10/2013 17:02 02/10/2013 20:57 02/11/2013 06:08 02/11/2013 11:39 02/11/2013 16:49 02/11/2013 21:39 02/12/2013 06:04 02/12/2013 11:22  Glucose-Capillary Latest Range: 70-99 mg/dL 664 (H) 94 403 (H) 82 71 141 (H) 342 (H) 186 (H) 131 (H) 260 (H)  Results for ALANYS, GODINO (MRN [id number]) as of 02/12/2013 12:11  Ref. Range 02/08/2013 00:40  Sodium Latest Range: 135-145 mEq/L 137  Potassium Latest Range: 3.5-5.1 mEq/L 3.9  Chloride Latest Range: 96-112 mEq/L 101  CO2 Latest Range: 19-32 mEq/L 20  BUN Latest Range: 6-23 mg/dL 5 (L)  Creatinine Latest Range: 0.50-1.10 mg/dL 8.75 (L)  Calcium Latest Range: 8.4-10.5 mg/dL 9.3  GFR calc non Af Amer Latest Range: >90 mL/min >90  GFR calc Af Amer Latest Range: >90 mL/min >90  Glucose Latest Range: 70-99 mg/dL 643 (H)   Needs meal coverage insulin.  Inpatient Diabetes Program Recommendations Correction (SSI): Add HS coverage Insulin - Meal Coverage: Would benefit from addition of meal coverage insulin - Novolog 4 units tidwc  HgbA1C: 10.0% - uncontrolled Outpatient Referral: Will order OP Diabetes Education consult for uncontrolled DM  Note: Pt states she needs a glucose meter, but had just told me that she does check her blood sugars. Pt said she doesn't really know if she has Medicaid or not. When asked if she had trouble getting her insulin, she said "Its's $300." Told her that Medicaid only  charges 3 -4 dollar copay.  Pt was unhappy with me that I could not sit down and talk about diabetes in general. Told her that my focus was to make recommendations on her insulin regimen and I would gladly refer her to OP Diabetes Education at [facility]. Encouraged her to view diabetes videos on pt ed channel and stressed importance of f/u with PCP for management of DM.  Pt seems manipulative and was not happy with my response. Discussed with RN to give care notes on DM. RN to put in Case Management Consult for obtaining PCP for diabetes management and pt also requests explanation of insurance benefits. Discussed HgbA1C results, goal for HgbA1C and how diet affects her glucose levels.  Consider addition of meal coverage insulin to better control blood sugar excursions.  Thank you. Ailene Ards, RD, LDN, CDE Inpatient Diabetes Coordinator [phone]   Recommend [facility] and [facility] for PCP.
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
## 2013-02-12 NOTE — [facility] Group Notes (Signed)
[facility] LCSW Group Therapy  02/12/2013 1:45 PM  Type of Therapy:  Group Therapy  Participation Level:  Minimal  Participation Quality:  Inattentive  Affect:  Irritable  Cognitive:  Lacking  Insight:  Lacking  Engagement in Therapy:  Limited  Modes of Intervention:  Discussion, Education, Exploration, Socialization and Support  Summary of Progress/Problems: [facility] Speaker came to talk about his personal journey with substance abuse and addiction. The pt processed ways by which to relate to the speaker. MHA speaker provided handouts and educational information pertaining to groups and services offered by the [facility]. Carolyn Ayala was disengaged and inattentive throughout today's group. She whispered to another group member until asked to be quiet. Carolyn Ayala left group after about 15 minutes and did not return. She demonstrates limited insight and does not show progress in the group setting AEB her inability to focus and actively participate in group. Carolyn Ayala demonstrates limited social skills AEB her inability to identify when she is disturbing others.    Smart, Calani Gick 02/12/2013, 1:45 PM

## 2013-02-13 DIAGNOSIS — F411 Generalized anxiety disorder: Secondary | ICD-10-CM | POA: Diagnosis present

## 2013-02-13 DIAGNOSIS — F4312 Post-traumatic stress disorder, chronic: Secondary | ICD-10-CM | POA: Diagnosis present

## 2013-02-13 LAB — GLUCOSE, CAPILLARY
Glucose-Capillary: 226 mg/dL — ABNORMAL HIGH (ref 70–99)
Glucose-Capillary: 156 mg/dL — ABNORMAL HIGH (ref 70–99)
Glucose-Capillary: 170 mg/dL — ABNORMAL HIGH (ref 70–99)
Glucose-Capillary: 252 mg/dL — ABNORMAL HIGH (ref 70–99)
Glucose-Capillary: 314 mg/dL — ABNORMAL HIGH (ref 70–99)

## 2013-02-13 MED ORDER — LOPERAMIDE HCL 2 MG PO CAPS
2.0000 mg | ORAL_CAPSULE | ORAL | Status: DC | PRN
  Administered 2013-02-13 – 2013-02-14 (×2): 2 mg via ORAL

## 2013-02-13 MED ORDER — INSULIN GLARGINE 100 UNIT/ML ~~LOC~~ SOLN
46.0000 [IU] | Freq: Every day | SUBCUTANEOUS | Status: DC
  Administered 2013-02-14 – 2013-02-16 (×3): 46 [IU] via SUBCUTANEOUS

## 2013-02-13 MED ORDER — INSULIN ASPART 100 UNIT/ML ~~LOC~~ SOLN
10.0000 [IU] | Freq: Once | SUBCUTANEOUS | Status: AC
  Administered 2013-02-13: 10 [IU] via SUBCUTANEOUS

## 2013-02-13 MED ORDER — HYDROXYZINE HCL 50 MG PO TABS
75.0000 mg | ORAL_TABLET | Freq: Four times a day (QID) | ORAL | Status: DC | PRN
  Administered 2013-02-13 – 2013-02-14 (×3): 75 mg via ORAL
  Filled 2013-02-13: qty 1
  Filled 2013-02-13: qty 3

## 2013-02-13 MED ORDER — ACETAMINOPHEN 500 MG PO TABS
1000.0000 mg | ORAL_TABLET | Freq: Four times a day (QID) | ORAL | Status: DC | PRN
  Administered 2013-02-13 – 2013-02-15 (×2): 1000 mg via ORAL
  Filled 2013-02-13 (×2): qty 2

## 2013-02-13 NOTE — Progress Notes (Signed)
Recreation Therapy Notes  Date: 09.02.2014 Time: 3:00pm Location: 300 Hall Dayroom  Group Topic: Communication, Team Building, Problem Solving  Goal Area(s) Addresses:  Patient will effectively work with peer towards shared goal.  Patient will identify skill used to make activity successful.  Patient will identify how skills used during activity can be used to reach post d/c goals.   Behavioral Response: Engaged in discussion, Disengaged in activity, Disoriented, Attention Seeking.   Intervention: Problem Solving Activity  Activity: Landing Pad. Working as a team patients were given 12 plastic drinking straws and a length of masking tape. Using the supplies provided patients were asked to build a freestanding landing pad to catch a golf ball being dropped from approximately 4 feet.    Education: Psychiatric nurse, Building control surveyor.   Education Outcome: Needs additional education.   Clinical Observations/Feedback: Patient actively participated in opening discussion, openly talking about her alcoholism and how it has effected her life. As group transitioned into activity patient stated she would "like to sit out this one" and opted not to participate in building team landing pad. Patient remained in group space, filling out a form. Patient playfully spoke to female peers, in a childlike, attention seeking manner. Patient did not contribute to wrap up discussion and it is unclear if she was actively listening as she continued to fill out paperwork.  As group wrapped up and LRT was leaving patient stated she always enjoys her groups, however patient has not been seen by this LRT. LRT let patient know she has not previous seen her, however patient stated she was sure that she has participated in recreation therapy groups previously.   Marykay Lex Daleen Steinhaus, LRT/CTRS  Serene Kopf L 02/13/2013 8:15 AM

## 2013-02-13 NOTE — BHH Group Notes (Signed)
[facility] [employer] Group Therapy  02/13/2013 , 1:29 PM    Type of Therapy:  [employer] Presentation  Participation Level:  Active  Participation Quality:  Attentive  Affect:  Blunted  Cognitive:  Oriented  Insight:  Limited  Engagement in Therapy:  None  Modes of Intervention:  Discussion, Education and Socialization  Summary of Progress/Problems:  Onalee Hua from [employer] came to present his recovery story and play the guitar.  Stayed for the first 20 minutes.  Got up and left.  She later returned, and was very appreciative of the guitar playing, letting him know through her applause and her comments.  Daryel Gerald B 02/13/2013 , 1:29 PM

## 2013-02-13 NOTE — Progress Notes (Signed)
D: Pt denies SI/HI. Pt present with flat affect. Depressed mood. Pt tearful this morning. Pt stated that she often hear voices telling her to do bad things but she do not want meds for it. Pt c/o of withdrawal symptoms, anxiety and tremors. Pt given meds for symptoms. A: Medications administered as ordered per MD. Verbal support given. Pt encouraged to attend groups. 15 minute checks performed for safety. R: Pt receptive to treatment.

## 2013-02-13 NOTE — Progress Notes (Signed)
Brief Nutrition follow up  Chart reviewed.  Patient moved from 300 hall to 400 hall secondary continued verbal altercations with peers.  Patient reports not eating.  Currently not appropriate for further DM education.  Encouraged intake.  Oran Rein, RD, LDN Clinical Inpatient Dietitian Pager:  [phone] Weekend and after hours pager:  [phone]

## 2013-02-13 NOTE — BHH Group Notes (Addendum)
[facility] LCSW Aftercare Discharge Planning Group Note   02/13/2013 10:11 AM  Participation Quality:  Did not attend      [place]
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
## 2013-02-13 NOTE — Progress Notes (Signed)
Recreation Therapy Notes  Date: 09.03.2014 Time: 9:30am Location: 400 Hall Dayroom  Group Topic: Leisure Education  Goal Area(s) Addresses:  Patient will verbalize activity of interest by end of group session. Patient will verbalize the ability to use positive leisure/recreation as a coping mechanism.  Behavioral Response: Did not attend. Patient arrived to group session at approximately 9:35am. At 9:40am patient was asked to leave group session by RN to see physician. Patient did not return to group session.   Marykay Lex Ottis Sarnowski, LRT/CTRS  Hala Narula L 02/13/2013 11:57 AM
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
## 2013-02-13 NOTE — Clinical Social Work Note (Signed)
CSW met with pt individually this afternoon. CSW informed pt that she may not be good candidate for BATS-mental health issues/ARCA-difficult in group setting. Pt has [facility] Admission date of Sept. 9th. She was told that on this date, she must show up at the facility by 8AM with 30 day med supply, Id, and clothing. There is small chance that they may be full and it may just be a screening and she must be prepared for this. Pt hoping to stay at [facility] for as long as possible and stated that she is having some med changes and being monitored by the MD. CSW encouraged pt to continue to attend and participate in groups and avoid further confrontations with pts or staff. Pt was pleasant and attentive throughout this discussion and stated that she was grateful to have gotten and admission/screening date for [facility]. Pt reported that she does not have anywhere to go other than a shelter prior to [facility].

## 2013-02-13 NOTE — Progress Notes (Signed)
Adult Psychoeducational Group Note  Date:  02/13/2013 Time:  8:45 PM  Group Topic/Focus:  Wrap-Up Group:   The focus of this group is to help patients review their daily goal of treatment and discuss progress on daily workbooks.  Participation Level:  Active  Participation Quality:  Appropriate  Affect:  Appropriate  Cognitive:  Appropriate  Insight: Appropriate  Engagement in Group:  Engaged  Modes of Intervention:  Support  Additional Comments:  Pt stated that her goal was to stay positive and focused. She says that she was able to do this today and it resulted in her having a good day. She shared that she had gotten accepted to a program and that she is looking forward to the next step after she leaves the hospital   Pete Schnitzer 02/13/2013, 8:45 PM

## 2013-02-14 LAB — GLUCOSE, CAPILLARY
Glucose-Capillary: 113 mg/dL — ABNORMAL HIGH (ref 70–99)
Glucose-Capillary: 41 mg/dL — CL (ref 70–99)
Glucose-Capillary: 155 mg/dL — ABNORMAL HIGH (ref 70–99)
Glucose-Capillary: 277 mg/dL — ABNORMAL HIGH (ref 70–99)
Glucose-Capillary: 274 mg/dL — ABNORMAL HIGH (ref 70–99)
Glucose-Capillary: 206 mg/dL — ABNORMAL HIGH (ref 70–99)

## 2013-02-14 MED ORDER — HYDROXYZINE HCL 25 MG PO TABS
25.0000 mg | ORAL_TABLET | Freq: Four times a day (QID) | ORAL | Status: DC | PRN
  Administered 2013-02-14 – 2013-02-16 (×5): 25 mg via ORAL
  Filled 2013-02-14 (×2): qty 1

## 2013-02-14 NOTE — Progress Notes (Signed)
Hypoglycemic Event  CBG: 41  Treatment: 15 GM carbohydrate snack  Symptoms: Sweaty, Hungry and Nervous/irritable  Follow-up CBG: Time:2010 CBG Result:113  Possible Reasons for Event: Medication regimen: novolog added for 2nd night of increased cbg. HS was 314 and 405 previous night. Ten units of novolog added for coverage.  Comments/MD notified: Continue to monitor.  Writer instructed to order an endocrine consult.    Carolyn Ayala  Remember to initiate Hypoglycemia Order Set & complete

## 2013-02-14 NOTE — Clinical Social Work Note (Signed)
CSW met with pt individually this afternoon. Pt was lethargic and disoriented during meeting. CSW informed pt of likely Friday d/c. Pt became upset and stated that her medications are still making her feel poorly and she still is expriencing passive SI. CSW offered encouragement and support to pt and provided her with list of shelters to call in the morning to check for bed availability. Pt reluctantly agreed to make these calls as soon as she woke up tomorrow morning. CSW will meet with pt tomorrow morning in order to check on status of these calls/bed availability at shelters. [facility] and is aware that if [facility] is full, this day will just be for a screening. Per Shelle Iron, pt must bring ID, 30 day med supply, and clothes with her on this day.

## 2013-02-14 NOTE — [facility] Group Notes (Signed)
[facility] Group Notes:  (Counselor/Nursing/MHT/Case Management/Adjunct)  02/14/2013 1:15PM  Type of Therapy:  Group Therapy  Participation Level:  Slept through the entire group-over sedated    Summary of Progress/Problems: The topic for group was balance in life.  Pt participated in the discussion about when their life was in balance and out of balance and how this feels.  Pt discussed ways to get back in balance and short term goals they can work on to get where they want to be.    Carolyn Ayala 02/14/2013 2:15 PM

## 2013-02-14 NOTE — B[facility] Suicide Risk Assessment (Signed)
[facility] INPATIENT:  Family/Significant Other Suicide Prevention Education  Suicide Prevention Education:  Contact Attempts: Bethanne Ginger (pt's aunt)  has been identified by the patient as the family member/significant other with whom the patient will be residing, and identified as the person(s) who will aid the patient in the event of a mental health crisis.  With written consent from the patient, two attempts were made to provide suicide prevention education, prior to and/or following the patient's discharge.  We were unsuccessful in providing suicide prevention education.  A suicide education pamphlet was given to the patient to share with family/significant other.  Date and time of first attempt: 02/13/13 3:15PM (voicemail left) Date and time of second attempt: 02/14/13 3:30PM  Smart, Herbert Seta 02/14/2013, 3:39 PM

## 2013-02-14 NOTE — Progress Notes (Signed)
Pt was in the consult room asleep at the desk. Pt had wasted her coffee on herself and on the floor. Writer adm pt's insulin at that time and encouraged pt to go to bed. At appr 0200, pt awakened requesting food stating she felt as if her blood sugar was low. [name] cbg was 41 and was given 4 oz of gingerale, plus pt had purchased snacks which she was eating while in bed. Upon recheck pts cbg was 113.

## 2013-02-14 NOTE — Progress Notes (Signed)
Patient ID: Carolyn Ayala, female   DOB: 27-Oct-1975, 37 y.o.   MRN: [id number] D: Patient continues to have med seeking behavior.  Patient states that "I'm still having tremors and I am so anxious."  She requested librium from nurse knowing that it had been discontinued.  She had to be told twice that it was discontinued.  Patient requested her 75 mg of vistaril and benedryl.  She is cooperative and can be slightly irritable.  She will be a discharge for tomorrow.  Patient advised to start calling shelters.  She plans to follow up with [facility]. A: Continue to monitor MD orders and medication management.  Safety checks completed every 15 minutes per protocol. R: Patient is receptive to staff.

## 2013-02-14 NOTE — Progress Notes (Signed)
Patient ID: Carolyn Ayala, female   DOB: 11/23/1975, 37 y.o.   MRN: [id number]  D: Pt continues to be anxious, but affect is flat. Pt doesn't give much info about her day or discussions she's had with the Drs. Pt did inform the writer that she was experiencing shoulder pain and req ultram. Pt also expressed a need for immodium after having diarrhea this morning. [name] blood sugar was 314. Writer received an order to give 10 units of novolog as well as increase pt's morning dose of lantus from 40 units to 46 units.   A: Order was written for increased tylenol and immodium. Continue 15 min checks for safety.  R: Pt remains safe.
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
## 2013-02-15 LAB — GLUCOSE, CAPILLARY
Glucose-Capillary: 260 mg/dL — ABNORMAL HIGH (ref 70–99)
Glucose-Capillary: 258 mg/dL — ABNORMAL HIGH (ref 70–99)
Glucose-Capillary: 322 mg/dL — ABNORMAL HIGH (ref 70–99)
Glucose-Capillary: 138 mg/dL — ABNORMAL HIGH (ref 70–99)

## 2013-02-15 MED ORDER — GABAPENTIN 800 MG PO TABS
800.0000 mg | ORAL_TABLET | Freq: Three times a day (TID) | ORAL | Status: DC
  Administered 2013-02-15 – 2013-02-16 (×5): 800 mg via ORAL
  Filled 2013-02-15 (×2): qty 56
  Filled 2013-02-15 (×2): qty 1
  Filled 2013-02-15: qty 56
  Filled 2013-02-15: qty 1
  Filled 2013-02-15: qty 56
  Filled 2013-02-15: qty 1

## 2013-02-15 MED ORDER — NALTREXONE HCL 50 MG PO TABS: 50.0000 mg | ORAL_TABLET | Freq: Every day | ORAL | Status: DC

## 2013-02-15 MED ORDER — AMOXICILLIN-POT CLAVULANATE 500-125 MG PO TABS: 1.0000 | ORAL_TABLET | Freq: Two times a day (BID) | ORAL | Status: DC

## 2013-02-15 MED ORDER — LORATADINE 10 MG PO TABS
10.0000 mg | ORAL_TABLET | Freq: Once | ORAL | Status: AC
  Administered 2013-02-15: 10 mg via ORAL
  Filled 2013-02-15: qty 1

## 2013-02-15 MED ORDER — METFORMIN HCL 500 MG PO TABS: 1000.0000 mg | ORAL_TABLET | Freq: Two times a day (BID) | ORAL | Status: DC

## 2013-02-15 MED ORDER — MIRTAZAPINE 30 MG PO TABS: 30.0000 mg | ORAL_TABLET | Freq: Every day | ORAL | Status: DC

## 2013-02-15 MED ORDER — "BD GETTING STARTED TAKE HOME KIT: 1ML X 30 G SYRINGES, "
1.0000 | Freq: Once | Status: DC
  Filled 2013-02-15: qty 1

## 2013-02-15 MED ORDER — LISINOPRIL 5 MG PO TABS: 5.0000 mg | ORAL_TABLET | Freq: Every day | ORAL | Status: DC

## 2013-02-15 MED ORDER — INSULIN GLARGINE 100 UNIT/ML ~~LOC~~ SOLN: 40.0000 [IU] | Freq: Two times a day (BID) | SUBCUTANEOUS | Status: DC

## 2013-02-15 MED ORDER — GABAPENTIN 400 MG PO CAPS: 800.0000 mg | ORAL_CAPSULE | Freq: Three times a day (TID) | ORAL | Status: DC

## 2013-02-15 NOTE — [facility] Suicide Risk Assessment (Signed)
Suicide Risk Assessment  Discharge Assessment     Demographic Factors:  Low socioeconomic status, Unemployed and homeless  Mental Status Per Nursing Assessment::   On Admission:  NA  Current Mental Status by Physician: patient denies suicidal ideation, intent and plan  Loss Factors: Financial problems/change in socioeconomic status  Historical Factors: N/A  Risk Reduction Factors:   Desire to stop Alcohol abuse  Continued Clinical Symptoms:  Alcohol/Substance Abuse/Dependencies Previous Psychiatric Diagnoses and Treatments  Cognitive Features That Contribute To Risk:  Closed-mindedness    Suicide Risk:  Minimal: No identifiable suicidal ideation.  Patients presenting with no risk factors but with morbid ruminations; may be classified as minimal risk based on the severity of the depressive symptoms  Discharge Diagnoses:   AXIS I: PTSD.  Alcoholism with alcohol dependence  AXIS II:  Cluster B Traits AXIS III:   Past Medical History  Diagnosis Date  . Hypertension   . Diabetes mellitus without complication    AXIS IV:  economic problems, housing problems, problems related to social environment and problems with access to health care services AXIS V:  61-70 mild symptoms  Plan Of Care/Follow-up recommendations:  Activity:  as tolerated  Diet:  healthy Tests:  routine Other:  patient to keep her after care appointment  Is patient on multiple antipsychotic therapies at discharge:  No   Has Patient had three or more failed trials of antipsychotic monotherapy by history:  No  Recommended Plan for Multiple Antipsychotic Therapies: N/A  Gottlieb Zuercher,MD 02/15/2013, 10:22 AM

## 2013-02-15 NOTE — Progress Notes (Signed)
Seen and agreed. Catera Hankins, MD 

## 2013-02-15 NOTE — Progress Notes (Signed)
Report received at start of writer's shift that pt was to discharge with bus passes. When writer read d/c instructions to pt and began teaching process, pt informed writer that she had no insulin, syringes, oral blood pressure medication, oral diabetic medication or cbg monitor. Pt stated that she had called [facility] and there were no beds available. Pt reported that she had no where to stay at this time. Informed AC and received order for insulin kit with syringes and lantus insulin. Per [facility], there is no cbg monitors at [facility] for d/c patients. They must be obtained at [facility]. Informed to call Trinda Pascal NP for sample orders. Per NP, she can not give orders for adult patients. Reported to [facility] and received order to cancel discharge at this time.

## 2013-02-15 NOTE — Progress Notes (Signed)
Pt refused Lantus stating that her blood sugar dropped to 41 while in the hospital following taking Lantus at night. Reviewed blood sugar results with the pt since admission to hospital. Explained that Lantus is long acting. Reinforced the importance of taking Lantus. Pt continues to refuse and quickly changed the subject requesting a medication for gas/indigestion.
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
## 2013-02-15 NOTE — Progress Notes (Signed)
Adult Psychoeducational Group Note  Date:  02/15/2013 Time:  11:31 AM  Group Topic/Focus:  Early Warning Signs:   The focus of this group is to help patients identify signs or symptoms they exhibit before slipping into an unhealthy state or crisis.  Participation Level:  None  Participation Quality:  Pt did not speak in group.  Affect:  Appropriate  Cognitive:  Pt did not speak in group.  Insight: None  Engagement in Group:  None  Modes of Intervention:  Discussion, Socialization and Support  Additional Comments:  Pt did come to group, but did not speak.  Cathlean Cower 02/15/2013, 11:31 AM

## 2013-02-15 NOTE — Progress Notes (Addendum)
D: Pt is anxious in affect and mood. Pt was positive for flautus this evening. Pt refused to take any Maalox for her gas at this time. Pt attended group this evening. Pt observed interacting appropriately within the milieu.  A: Writer administered scheduled and prn medications to pt. [name] anxiety was managed with vistaril. Writer gave pt a salad with her insulin this evening to help prevent hypoglycemia throughout the night. Continued support and availability as needed was extended to this pt. Staff continue to monitor pt with q25min checks.  R: No adverse drug reactions noted. Pt receptive to treatment. Pt remains safe at this time.

## 2013-02-15 NOTE — Progress Notes (Signed)
Carolyn Ayala requested to meet with chaplain after spiritual theme group.   Concerned about discharge, expressing fear that she has no place to stay and that she is going to relapse d/t being around others who use.  Chaplain inquired as to whether SW or MD had spoken with pt about specific discharge plans.  Pt was not forthcoming with information about discharge.  Chaplain advised that pt become clear about discharge plans and then moved toward helping pt plan for discharge.  Worked to identify strengths and motivation that pt could draw upon and possible plans in order to keep from using ETOH.   Pt wished chaplain to speak with SW and MD and "convince them to let (her) stay until Monday."  This chaplain informed pt that he would communicate her concerns to SW and enter into chart for MD.  .  Chaplain informed SW, who advised that pt is scheduled to discharge today and SW has been encouraging pt to develop plan.     Belva Crome MDiv

## 2013-02-15 NOTE — BHH Group Notes (Signed)
Adult Psychoeducational Group Note  Date:  02/15/2013 Time:  9:23 PM  Group Topic/Focus:  Wrap-Up Group:   The focus of this group is to help patients review their daily goal of treatment and discuss progress on daily workbooks.  Participation Level:  Did Not Attend  Participation Quality:  None  Affect:  None  Cognitive:  None  Insight: None  Engagement in Group:  None  Modes of Intervention:  Discussion  Additional Comments:  Oriyah did not attend group.  Caroll Rancher A 02/15/2013, 9:23 PM

## 2013-02-15 NOTE — Progress Notes (Signed)
Recreation Therapy Notes  Date: 09.05.2014  Time: 9:30pm  Location: [address] Dayroom   Group Topic: Coping Skills   Goal Area(s) Addresses:  Patient will identify things they are grateful for.  Patient will identify how being grateful can influence decision making.   Behavioral Response: Appropriate  Education: Coping Skills, Discharge Planning   Education Outcome: Acknowledges understanding   Clinical Observations/Feedback: Patient left group at approximately 9:35pm. Patient returned as group was ending.   Marykay Lex Moksh Loomer, LRT/CTRS  Donato Studley L 02/15/2013 2:18 PM

## 2013-02-15 NOTE — Tx Team (Signed)
Interdisciplinary Treatment Plan Update (Adult)  Date: 02/15/2013   Time Reviewed: 12:31 PM  Progress in Treatment:  Attending groups: Yes Participating in groups:  Yes Taking medication as prescribed: Yes  Tolerating medication: Yes  Family/Significant othe contact made: voicemails left for pt's aunt. Completed SPE with pt.  Patient understands diagnosis: Yes, AEB seeking treatment for SI with plan, ETOH detox, depression, and bipolar disorder.  Discussing patient identified problems/goals with staff: Yes  Medical problems stabilized or resolved: Yes  Denies suicidal/homicidal ideation: Yes, self report.  Patient has not harmed self or Others: Yes  New problem(s) identified: n/a Discharge Plan or Barriers: Pt will follow[facility] at [facility] and [facility] (tues.) She was given several shelter numbers to call to check for bed availability, being that she does not have a place to go until admission into [facility]. Pt resistant to calling shelters but reports no family support at this time.  Additional comments:  Reason for Continuation of Hospitalization: d/c today Estimated length of stay: d/c today For review of initial/current patient goals, please see plan of care.  Attendees:  Patient:    Family:    Physician: Thedore Mins  02/15/2013 12:31 PM   Nursing:    Clinical Social Worker [employer], LCSWA  02/15/2013 12:31 PM   Other:    Other:    Other:     Other:    Scribe for Treatment Team:  Trula Slade LCSWA 02/15/2013 12:31 PM

## 2013-02-15 NOTE — Progress Notes (Signed)
[facility] Adult Case Management Discharge Plan :  Will you be returning to the same living situation after discharge: No.pt will stay [facility]. At discharge, do you have transportation home?:Yes,  bus pass provided Do you have the ability to pay for your medications:Yes,  mental health  Release of information consent forms completed and in the chart;  Patient's signature needed at discharge.  Patient to Follow up at: Follow-up Information   Follow up with [facility]. (Walk in Monday through Friday between 8AM-9AM for hospital followup/medication management. )    Contact information:   [address], [place] [address] Phone: [phone] Fax: [phone]      Follow up with [facility] On 02/19/2013. (Arrive by 8AM with ID, 30 day medication supply, and clothing. Be prepared that there may not be bed available in which case, this will be screening. Contact person: JEFF)    Contact information:   [address] [place], [place] [address] Phone: [phone] Fax: [phone]      Patient denies SI/HI:   Yes,  during group/self report    Safety Planning and Suicide Prevention discussed:  Yes,  SPE completed with pt. SPI pamphlet provided to pt and she was encouraged to share with her support system. Contact attempts made to pt's aunt. Voicemails left.   Smart, Eloina Ergle 02/15/2013, 12:38 PM

## 2013-02-16 DIAGNOSIS — F1020 Alcohol dependence, uncomplicated: Secondary | ICD-10-CM

## 2013-02-16 LAB — GLUCOSE, CAPILLARY
Glucose-Capillary: 208 mg/dL — ABNORMAL HIGH (ref 70–99)
Glucose-Capillary: 253 mg/dL — ABNORMAL HIGH (ref 70–99)

## 2013-02-16 NOTE — BHH Group Notes (Signed)
BHH Group Notes:  (Clinical Social Work)  02/16/2013  11:15-11:45AM  Summary of Progress/Problems:   The main focus of today's process group was for the patient to identify ways in which they have in the past sabotaged their own recovery and reasons they may have done this/what they received from doing it.  We then worked to identify a specific plan to avoid doing this when discharged from the hospital for this admission.  The patient expressed about 15 minutes into group that she really does not understand what "self-sabotage" means.  More time was spent explaining this in different ways, and she then expressed understanding.  She stated that she hears negative talking all the time, stating she will never get custody of her son back, and this hopelessness disables her emotionally.  When asked, she was unable to identify whether this is her own voice employing negative self-talk or an actual auditory hallucination.  Type of Therapy:  Group Therapy - Process  Participation Level:  Active  Participation Quality:  Attentive and Sharing  Affect:  Blunted and Depressed  Cognitive:  Confused  Insight:  Developing/Improving  Engagement in Therapy:  Developing/Improving  Modes of Intervention:  Clarification, Education, Exploration, Discussion  Ambrose Mantle, LCSW 02/16/2013, 1:00 PM

## 2013-02-16 NOTE — Progress Notes (Signed)
Nursing Discharge note: Patient denies SI/HI. This Clinical research associate and SA met with pt to discuss discharge options. [facility] . Discharge instructions went over with patient patient has a good understanding of information  Medications and prescriptions given to patient. Belongings given to pt. Along with bus tokens and money for transfer for shelter.  CBG was 263 was covered with 15 units of Nova log as per order at lunch time.

## 2013-02-16 NOTE — BHH Group Notes (Signed)
BHH Group Notes:  (Nursing/MHT/Case Management/Adjunct)  Date:  02/16/2013  Time:  11:32 AM  Type of Therapy:  Nurse Education  Participation Level:  Minimal  Participation Quality:  Sharing  Affect:  Appropriate  Cognitive:  Alert  Insight:  Appropriate  Engagement in Group:  Improving  Modes of Intervention:  Problem-solving  Summary of Progress/Problems:  Carolyn Ayala 02/16/2013, 11:32 AM

## 2013-02-16 NOTE — Progress Notes (Signed)
Patient ID: Carolyn Ayala, female   DOB: 05-09-1976, 37 y.o.   MRN: [id number]  D: Patient lying in bed with eyes closed. Up at shift change asking for pain med for feet and vistaril for anxiety. Also agreed to take lantus insulin that she refused at bedtime. Given  snacks due to patient feeling that blood sugar will be too low in the am.  A: Staff will continue to monitor on q 15 minute checks, follow treatment plan, and give meds as ordered. R: In bed at this time sleeping.

## 2013-02-16 NOTE — Progress Notes (Signed)
[facility] Adult Case Management Discharge Plan :  Will you be returning to the same living situation after discharge: No.  CSW called 3 shelters in [facility], but no beds were available.  The patient was given telephone number, address, and a map for how to get to [facility] in [place] At discharge, do you have transportation home?:No.  Was given 2 city bus passes, money for PART bus to [place], and a map with instructions for how to do all parts of the tripo Do you have the ability to pay for your medications:No.  Is going to [facility] [place] for assistance.  Release of information consent forms completed and in the chart;  Patient's signature needed at discharge.  Patient to Follow up at: Follow-up Information   Follow up with [facility]. (Walk in Monday through Friday between 8AM-9AM for hospital followup/medication management. )    Contact information:   [address], [place] [address] Phone: [phone] Fax: [phone]      Follow up with [facility] On 02/19/2013. (Arrive by 8AM with ID, 30 day medication supply, and clothing. Be prepared that there may not be bed available in which case, this will be screening. Contact person: JEFF)    Contact information:   [address] [place], [place] [address] Phone: [phone] Fax: [phone]      Patient denies SI/HI:   Yes,  denies    Safety Planning and Suicide Prevention discussed:  Yes,  with weekday staff  Sarina Ser 02/16/2013, 5:08 PM

## 2013-02-16 NOTE — BHH Suicide Risk Assessment (Signed)
Suicide Risk Assessment  Discharge Assessment     Demographic Factors:  Low socioeconomic status  Mental Status Per Nursing Assessment::   On Admission:  NA  Current Mental Status by Physician: Alert oriented with no suicidal toughts. No psychotic symptoms or delusions. Reasonable insight and memory.  Loss Factors: Decrease in vocational status  Historical Factors: Impulsivity  Risk Reduction Factors:   Sense of responsibility to family and Positive therapeutic relationship  Continued Clinical Symptoms:  Dysthymia  Cognitive Features That Contribute To Risk:  Closed-mindedness Polarized thinking    Suicide Risk:  Minimal: No identifiable suicidal ideation.  Patients presenting with no risk factors but with morbid ruminations; may be classified as minimal risk based on the severity of the depressive symptoms  Discharge Diagnoses:   AXIS I:  Alcohol Abuse and Post Traumatic Stress Disorder AXIS II:  Cluster B Traits AXIS III:   Past Medical History  Diagnosis Date  . Hypertension   . Diabetes mellitus without complication   . Mental disorder   . Depression    AXIS IV:  economic problems and other psychosocial or environmental problems AXIS V:  61-70 mild symptoms  Plan Of Care/Follow-up recommendations:  Activity:  as tolerated Diet:  Diabetic. Patient aware  Is patient on multiple antipsychotic therapies at discharge:  No   Has Patient had three or more failed trials of antipsychotic monotherapy by history:  No  Recommended Plan for Multiple Antipsychotic Therapies: NA  Follow [facility] with [facility] as scheduled. See Discharge note.  Alishea Beaudin 02/16/2013, 2:05 PM

## 2013-02-17 ENCOUNTER — Emergency Department (HOSPITAL_COMMUNITY)
Admission: EM | Admit: 2013-02-17 | Discharge: 2013-02-17 | Disposition: A | Discharge status: Home / Self Care | Payer: Self-pay | Attending: Emergency Medicine | Admitting: Emergency Medicine

## 2013-02-17 ENCOUNTER — Encounter (HOSPITAL_COMMUNITY): Payer: Self-pay | Admitting: Emergency Medicine

## 2013-02-17 DIAGNOSIS — E119 Type 2 diabetes mellitus without complications: Secondary | ICD-10-CM | POA: Insufficient documentation

## 2013-02-17 DIAGNOSIS — Z79899 Other long term (current) drug therapy: Secondary | ICD-10-CM | POA: Insufficient documentation

## 2013-02-17 DIAGNOSIS — F3289 Other specified depressive episodes: Secondary | ICD-10-CM | POA: Insufficient documentation

## 2013-02-17 DIAGNOSIS — F4312 Post-traumatic stress disorder, chronic: Secondary | ICD-10-CM

## 2013-02-17 DIAGNOSIS — F1994 Other psychoactive substance use, unspecified with psychoactive substance-induced mood disorder: Secondary | ICD-10-CM | POA: Diagnosis present

## 2013-02-17 DIAGNOSIS — F329 Major depressive disorder, single episode, unspecified: Secondary | ICD-10-CM | POA: Insufficient documentation

## 2013-02-17 DIAGNOSIS — I10 Essential (primary) hypertension: Secondary | ICD-10-CM | POA: Insufficient documentation

## 2013-02-17 DIAGNOSIS — F17200 Nicotine dependence, unspecified, uncomplicated: Secondary | ICD-10-CM | POA: Insufficient documentation

## 2013-02-17 DIAGNOSIS — F4310 Post-traumatic stress disorder, unspecified: Secondary | ICD-10-CM | POA: Insufficient documentation

## 2013-02-17 DIAGNOSIS — F1020 Alcohol dependence, uncomplicated: Secondary | ICD-10-CM | POA: Diagnosis present

## 2013-02-17 DIAGNOSIS — F411 Generalized anxiety disorder: Secondary | ICD-10-CM | POA: Insufficient documentation

## 2013-02-17 DIAGNOSIS — Z792 Long term (current) use of antibiotics: Secondary | ICD-10-CM | POA: Insufficient documentation

## 2013-02-17 DIAGNOSIS — F1010 Alcohol abuse, uncomplicated: Secondary | ICD-10-CM

## 2013-02-17 DIAGNOSIS — Z3202 Encounter for pregnancy test, result negative: Secondary | ICD-10-CM | POA: Insufficient documentation

## 2013-02-17 DIAGNOSIS — Z794 Long term (current) use of insulin: Secondary | ICD-10-CM | POA: Insufficient documentation

## 2013-02-17 DIAGNOSIS — R45851 Suicidal ideations: Secondary | ICD-10-CM | POA: Insufficient documentation

## 2013-02-17 LAB — ACETAMINOPHEN LEVEL: Acetaminophen (Tylenol), Serum: <15 ug/mL (ref 10–30)

## 2013-02-17 LAB — COMPREHENSIVE METABOLIC PANEL
ALT: 35 U/L (ref 0–35)
AST: 26 U/L (ref 0–37)
Albumin: 3.5 g/dL (ref 3.5–5.2)
Alkaline Phosphatase: 90 U/L (ref 39–117)
BUN: 8 mg/dL (ref 6–23)
CO2: 24 mEq/L (ref 19–32)
Calcium: 9.4 mg/dL (ref 8.4–10.5)
Chloride: 102 mEq/L (ref 96–112)
Creatinine, Ser: 0.43 mg/dL — ABNORMAL LOW (ref 0.50–1.10)
GFR calc Af Amer: >90 mL/min (ref >90)
GFR calc non Af Amer: >90 mL/min (ref >90)
Glucose, Bld: 190 mg/dL — ABNORMAL HIGH (ref 70–99)
Potassium: 4 mEq/L (ref 3.5–5.1)
Sodium: 137 mEq/L (ref 135–145)
Total Bilirubin: 0.1 mg/dL — ABNORMAL LOW (ref 0.3–1.2)
Total Protein: 7.7 g/dL (ref 6.0–8.3)

## 2013-02-17 LAB — RAPID URINE DRUG SCREEN, HOSP PERFORMED
Amphetamines: NOT DETECTED
Barbiturates: NOT DETECTED
Benzodiazepines: POSITIVE — AB
Cocaine: NOT DETECTED
Opiates: NOT DETECTED
Tetrahydrocannabinol: NOT DETECTED

## 2013-02-17 LAB — CBC
HCT: 30 % — ABNORMAL LOW (ref 36.0–46.0)
Hemoglobin: 9.8 g/dL — ABNORMAL LOW (ref 12.0–15.0)
MCH: 22.1 pg — ABNORMAL LOW (ref 26.0–34.0)
MCHC: 32.7 g/dL (ref 30.0–36.0)
MCV: 67.7 fL — ABNORMAL LOW (ref 78.0–100.0)
Platelets: 450 10*3/uL — ABNORMAL HIGH (ref 150–400)
RBC: 4.43 MIL/uL (ref 3.87–5.11)
RDW: 17.1 % — ABNORMAL HIGH (ref 11.5–15.5)
WBC: 8.9 10*3/uL (ref 4.0–10.5)

## 2013-02-17 LAB — ETHANOL: Alcohol, Ethyl (B): <11 mg/dL (ref 0–11)

## 2013-02-17 LAB — GLUCOSE, CAPILLARY
Glucose-Capillary: 186 mg/dL — ABNORMAL HIGH (ref 70–99)
Glucose-Capillary: 143 mg/dL — ABNORMAL HIGH (ref 70–99)
Glucose-Capillary: 202 mg/dL — ABNORMAL HIGH (ref 70–99)

## 2013-02-17 LAB — POCT PREGNANCY, URINE: Preg Test, Ur: NEGATIVE

## 2013-02-17 LAB — SALICYLATE LEVEL: Salicylate Lvl: <2 mg/dL — ABNORMAL LOW (ref 2.8–20.0)

## 2013-02-17 MED ORDER — FLUOXETINE HCL 20 MG PO CAPS
40.0000 mg | ORAL_CAPSULE | Freq: Every morning | ORAL | Status: DC
  Administered 2013-02-17: 40 mg via ORAL
  Filled 2013-02-17: qty 2

## 2013-02-17 MED ORDER — INSULIN GLARGINE 100 UNIT/ML ~~LOC~~ SOLN: 44.0000 [IU] | Freq: Two times a day (BID) | SUBCUTANEOUS | Status: DC

## 2013-02-17 MED ORDER — LISINOPRIL 5 MG PO TABS
5.0000 mg | ORAL_TABLET | Freq: Every day | ORAL | Status: DC
  Filled 2013-02-17: qty 1

## 2013-02-17 MED ORDER — INSULIN GLARGINE 100 UNIT/ML ~~LOC~~ SOLN
44.0000 [IU] | Freq: Every day | SUBCUTANEOUS | Status: DC
  Filled 2013-02-17: qty 0.44

## 2013-02-17 MED ORDER — HYDROXYZINE HCL 25 MG PO TABS: 100.0000 mg | ORAL_TABLET | Freq: Four times a day (QID) | ORAL | Status: DC | PRN

## 2013-02-17 MED ORDER — TRAMADOL HCL 50 MG PO TABS: 100.0000 mg | ORAL_TABLET | Freq: Four times a day (QID) | ORAL | Status: DC | PRN

## 2013-02-17 MED ORDER — GABAPENTIN 400 MG PO CAPS
1000.0000 mg | ORAL_CAPSULE | Freq: Three times a day (TID) | ORAL | Status: DC
  Administered 2013-02-17: 10:00:00 1000 mg via ORAL
  Filled 2013-02-17 (×3): qty 2

## 2013-02-17 MED ORDER — INSULIN ASPART 100 UNIT/ML ~~LOC~~ SOLN
0.0000 [IU] | SUBCUTANEOUS | Status: DC
  Administered 2013-02-17: 2 [IU] via SUBCUTANEOUS
  Filled 2013-02-17: qty 2

## 2013-02-17 MED ORDER — METFORMIN HCL 500 MG PO TABS
1000.0000 mg | ORAL_TABLET | Freq: Two times a day (BID) | ORAL | Status: DC
  Administered 2013-02-17: 1000 mg via ORAL
  Filled 2013-02-17 (×3): qty 2

## 2013-02-17 MED ORDER — INSULIN GLARGINE 100 UNIT/ML ~~LOC~~ SOLN
46.0000 [IU] | Freq: Every day | SUBCUTANEOUS | Status: DC
  Filled 2013-02-17: qty 0.46

## 2013-02-17 MED ORDER — MIRTAZAPINE 30 MG PO TABS: 30.0000 mg | ORAL_TABLET | Freq: Every day | ORAL | Status: DC

## 2013-02-17 MED ORDER — INSULIN ASPART 100 UNIT/ML ~~LOC~~ SOLN
0.0000 [IU] | Freq: Three times a day (TID) | SUBCUTANEOUS | Status: DC
  Administered 2013-02-17: 8 [IU] via SUBCUTANEOUS
  Filled 2013-02-17: qty 8

## 2013-02-17 NOTE — ED Notes (Signed)
Report given to [place], rn

## 2013-02-17 NOTE — ED Notes (Signed)
eatting breakfast 

## 2013-02-17 NOTE — Discharge Summary (Signed)
Physician Discharge Summary Note  Patient:  Carolyn Ayala is an 37 y.o., female MRN:  [id number] DOB:  December 24, 1975 Patient phone:  [phone] (home)  Patient address:   [address] [place] [address],   Date of Admission:  02/17/2013 Date of Discharge: 02/17/2013  Reason for Admission:  Alcohol dependency  Discharge Diagnoses: Principal Problem:   Substance induced mood disorder Active Problems:   Alcoholism with alcohol dependence  Review of Systems  Constitutional: Negative.   HENT: Negative.   Eyes: Negative.   Respiratory: Negative.   Cardiovascular: Negative.   Gastrointestinal: Negative.   Genitourinary: Negative.   Musculoskeletal: Negative.   Skin: Negative.   Neurological: Negative.   Endo/Heme/Allergies: Negative.   Psychiatric/Behavioral: Positive for substance abuse.    DSM5:  Substance/Addictive Disorders:  Alcohol Related Disorder - Moderate (303.90)   Axis Diagnosis:   AXIS I:  Alcohol Abuse AXIS II:  Deferred AXIS III:   Past Medical History  Diagnosis Date  . Hypertension   . Diabetes mellitus without complication   . Mental disorder   . Depression    AXIS IV:  economic problems, housing problems, occupational problems, other psychosocial or environmental problems, problems related to social environment and problems with primary support group AXIS V:  61-70 mild symptoms  Level of Care:  OP  Hospital Course:  Patient was released yesterday from [facility] for alcohol dependency and she does not know why but drank two beers.  She is suppose to go to [facility] for rehab on 9/9 and go to [place] for her care.  Ms. Bernardi refused to leave [facility] yesterday because she does not have a place to stay despite being able to go to a shelter.  The police had to come to [facility] for her to discharge.  Consults:  None  Significant Diagnostic Studies:  labs: completed and reviewed, stable  Discharge Vitals:   Blood pressure 128/89, pulse 120, temperature 98.5 F  (36.9 C), temperature source Oral, resp. rate 17, height 5\' 2"  (1.575 m), weight 68.04 kg (150 lb), last menstrual period 01/31/2013, SpO2 100.00%. Body mass index is 27.43 kg/(m^2). Lab Results:   Results for orders placed during the hospital encounter of 02/17/13 (from the past 72 hour(s))  URINE RAPID DRUG SCREEN (HOSP PERFORMED)     Status: Abnormal   Collection Time    02/17/13  5:58 AM      Result Value Range   Opiates NONE DETECTED  NONE DETECTED   Cocaine NONE DETECTED  NONE DETECTED   Benzodiazepines POSITIVE (*) NONE DETECTED   Amphetamines NONE DETECTED  NONE DETECTED   Tetrahydrocannabinol NONE DETECTED  NONE DETECTED   Barbiturates NONE DETECTED  NONE DETECTED   Comment:            DRUG SCREEN FOR MEDICAL PURPOSES     ONLY.  IF CONFIRMATION IS NEEDED     FOR ANY PURPOSE, NOTIFY LAB     WITHIN 5 DAYS.                LOWEST DETECTABLE LIMITS     FOR URINE DRUG SCREEN     Drug Class       Cutoff (ng/mL)     Amphetamine      1000     Barbiturate      200     Benzodiazepine   200     Tricyclics       300     Opiates  300     Cocaine          300     THC              50  POCT PREGNANCY, URINE     Status: None   Collection Time    02/17/13  6:02 AM      Result Value Range   Preg Test, Ur NEGATIVE  NEGATIVE   Comment:            THE SENSITIVITY OF THIS     METHODOLOGY IS >24 mIU/mL  ACETAMINOPHEN LEVEL     Status: None   Collection Time    02/17/13  6:20 AM      Result Value Range   Acetaminophen (Tylenol), Serum <15.0  10 - 30 ug/mL   Comment:            THERAPEUTIC CONCENTRATIONS VARY     SIGNIFICANTLY. A RANGE OF 10-30     ug/mL MAY BE AN EFFECTIVE     CONCENTRATION FOR MANY PATIENTS.     HOWEVER, SOME ARE BEST TREATED     AT CONCENTRATIONS OUTSIDE THIS     RANGE.     ACETAMINOPHEN CONCENTRATIONS     >150 ug/mL AT 4 HOURS AFTER     INGESTION AND >50 ug/mL AT 12     HOURS AFTER INGESTION ARE     OFTEN ASSOCIATED WITH TOXIC     REACTIONS.  CBC      Status: Abnormal   Collection Time    02/17/13  6:20 AM      Result Value Range   WBC 8.9  4.0 - 10.5 K/uL   RBC 4.43  3.87 - 5.11 MIL/uL   Hemoglobin 9.8 (*) 12.0 - 15.0 g/dL   HCT 16.1 (*) 09.6 - 04.5 %   MCV 67.7 (*) 78.0 - 100.0 fL   MCH 22.1 (*) 26.0 - 34.0 pg   MCHC 32.7  30.0 - 36.0 g/dL   RDW 40.9 (*) 81.1 - 91.4 %   Platelets 450 (*) 150 - 400 K/uL  COMPREHENSIVE METABOLIC PANEL     Status: Abnormal   Collection Time    02/17/13  6:20 AM      Result Value Range   Sodium 137  135 - 145 mEq/L   Potassium 4.0  3.5 - 5.1 mEq/L   Chloride 102  96 - 112 mEq/L   CO2 24  19 - 32 mEq/L   Glucose, Bld 190 (*) 70 - 99 mg/dL   BUN 8  6 - 23 mg/dL   Creatinine, Ser 7.82 (*) 0.50 - 1.10 mg/dL   Calcium 9.4  8.4 - 95.6 mg/dL   Total Protein 7.7  6.0 - 8.3 g/dL   Albumin 3.5  3.5 - 5.2 g/dL   AST 26  0 - 37 U/L   ALT 35  0 - 35 U/L   Alkaline Phosphatase 90  39 - 117 U/L   Total Bilirubin 0.1 (*) 0.3 - 1.2 mg/dL   GFR calc non Af Amer >90  >90 mL/min   GFR calc Af Amer >90  >90 mL/min   Comment: (NOTE)     The eGFR has been calculated using the CKD EPI equation.     This calculation has not been validated in all clinical situations.     eGFR's persistently <90 mL/min signify possible Chronic Kidney     Disease.  ETHANOL     Status: None   Collection Time  02/17/13  6:20 AM      Result Value Range   Alcohol, Ethyl (B) <11  0 - 11 mg/dL   Comment:            LOWEST DETECTABLE LIMIT FOR     SERUM ALCOHOL IS 11 mg/dL     FOR MEDICAL PURPOSES ONLY  SALICYLATE LEVEL     Status: Abnormal   Collection Time    02/17/13  6:20 AM      Result Value Range   Salicylate Lvl <2.0 (*) 2.8 - 20.0 mg/dL  GLUCOSE, CAPILLARY     Status: Abnormal   Collection Time    02/17/13  6:32 AM      Result Value Range   Glucose-Capillary 186 (*) 70 - 99 mg/dL   Comment 1 Notify RN    GLUCOSE, CAPILLARY     Status: Abnormal   Collection Time    02/17/13  8:04 AM      Result Value Range    Glucose-Capillary 143 (*) 70 - 99 mg/dL  GLUCOSE, CAPILLARY     Status: Abnormal   Collection Time    02/17/13 11:51 AM      Result Value Range   Glucose-Capillary 202 (*) 70 - 99 mg/dL    Physical Findings: AIMS:  , ,  ,  ,    CIWA:    COWS:     Psychiatric Specialty Exam: See Psychiatric Specialty Exam and Suicide Risk Assessment completed by Attending Physician prior to discharge.  Discharge destination:  Home  Is patient on multiple antipsychotic therapies at discharge:  No   Has Patient had three or more failed trials of antipsychotic monotherapy by history:  No  Recommended Plan for Multiple Antipsychotic Therapies:  NA     Medication List       Indication   amoxicillin-clavulanate 500-125 MG per tablet  Commonly known as:  AUGMENTIN  Take 1 tablet (500 mg total) by mouth 2 (two) times daily. Patient has eight doses of antibiotic therapy left. Take one tablet twice daily until finished for your dental infection.   Indication:  Dental Infection     FLUoxetine 40 MG capsule  Commonly known as:  PROZAC  Take 1 capsule (40 mg total) by mouth every morning.   Indication:  Depressive Phase of Manic-Depression     gabapentin 400 MG capsule  Commonly known as:  NEURONTIN  Take 3 capsules (1,200 mg total) by mouth 3 (three) times daily.   Indication:  Alcohol Withdrawal Syndrome     hydrOXYzine 50 MG tablet  Commonly known as:  ATARAX/VISTARIL  Take 100 mg by mouth every 6 (six) hours as needed for anxiety.      insulin glargine 100 UNIT/ML injection  Commonly known as:  LANTUS  Inject 0.44-0.46 mLs (44-46 Units total) into the skin 2 (two) times daily. Inject 46 units in the morning and 44 units at night   Indication:  Insulin-Dependent Diabetes     insulin regular 100 units/mL injection  Commonly known as:  NOVOLIN R,HUMULIN R  Inject 0.11-0.17 mLs (11-17 Units total) into the skin 3 (three) times daily before meals. Sliding scale; 50-no units, 200-17 units    Indication:  Insulin-Dependent Diabetes     lisinopril 5 MG tablet  Commonly known as:  PRINIVIL,ZESTRIL  Take 1 tablet (5 mg total) by mouth daily.   Indication:  High Blood Pressure     metFORMIN 500 MG tablet  Commonly known as:  GLUCOPHAGE  Take 2  tablets (1,000 mg total) by mouth 2 (two) times daily with a meal.   Indication:  Type 2 Diabetes     mirtazapine 30 MG tablet  Commonly known as:  REMERON  Take 1 tablet (30 mg total) by mouth at bedtime.   Indication:  Trouble Sleeping, Major Depressive Disorder     traMADol 50 MG tablet  Commonly known as:  ULTRAM  Take 100 mg by mouth every 6 (six) hours as needed for pain.      triamcinolone cream 0.1 %  Commonly known as:  KENALOG  Apply 1 application topically 2 (two) times daily as needed (rash).          Follow-up recommendations:  Activity:  As tolerated Diet:  Low-sodium heart healthy diet  Comments:  Patient will [facility] at [facility].   Total Discharge Time:  Greater than 30 minutes.  SignedNanine Means, PMH-NP 02/17/2013, 11:58 AM  I agreed with the findings, treatment and disposition plan of this patient. Kathryne Sharper, MD

## 2013-02-17 NOTE — ED Notes (Signed)
IVC papers have not been served as yet.  Magistrate contacted and they do not have the papers, [employer] contacted and they do not have them.  Receipt of papers confirmed w/ reporting nurse prior to patients transfer.  Pt is to be dc'd home and IVC papers recended by Dr Lolly Mustache

## 2013-02-17 NOTE — ED Notes (Signed)
Pt c/o depression, pt reports SI with plan of running in front of car. Pt initially gave false name to registration.

## 2013-02-17 NOTE — ED Notes (Signed)
Pt changed into scrubs, wanded by security. Pt had scripts for Mirtazapine, Naltrexone,Neurontin, Box of Insulin Syringes and CBG meter, bottle of Lantus Insulin, bottle of control, tub of Eucerin Cream, tube of triple antibiotic ointment, tube of hydrocortisone cream, Neurontin Tablets 800mg  (52), Augmentin 500/125mg  (11), Mirtazapine 30mg  (13), Naltrexone 50mg  (14). Blue lighter. Manson Passey purse with various items, no wallet money or ID found, paperwork from [place], [facility] and [employer] found.

## 2013-02-17 NOTE — ED Notes (Signed)
Pt seen by [employer] and registration outside front doors fumbling in pants then kicking item in bushes, [employer] investigated this and found napkin covered in feces.

## 2013-02-17 NOTE — ED Provider Notes (Signed)
CSN: [id number]     Arrival date & time 02/17/13  0518 History   First MD Initiated Contact with Patient 02/17/13 [phone]     Chief Complaint  Patient presents with  . Medical Clearance   (Consider location/radiation/quality/duration/timing/severity/associated sxs/prior Treatment) Patient is a 37 y.o. female presenting with mental health disorder. The history is provided by the patient.  Mental Health Problem Presenting symptoms: bizarre behavior, depression, suicidal thoughts and suicidal threats   Degree of incapacity (severity):  Moderate Onset quality:  Gradual Duration:  12 hours Timing:  Constant Progression:  Unchanged Chronicity:  Recurrent Context: alcohol use   Relieved by:  Nothing Worsened by:  Nothing tried Ineffective treatments:  None tried Associated symptoms: no abdominal pain, no chest pain, no fatigue and no headaches     Past Medical History  Diagnosis Date  . Hypertension   . Diabetes mellitus without complication   . Mental disorder   . Depression    Past Surgical History  Procedure Laterality Date  . Cesarean section     No family history on file. History  Substance Use Topics  . Smoking status: Current Every Day Smoker -- 0.25 packs/day  . Smokeless tobacco: Not on file  . Alcohol Use: Yes     Comment: daily   OB History   Grav Para Term Preterm Abortions TAB SAB Ect Mult Living                 Review of Systems  Constitutional: Negative for fever and fatigue.  HENT: Negative for congestion, drooling and neck pain.   Eyes: Negative for pain.  Respiratory: Negative for cough and shortness of breath.   Cardiovascular: Negative for chest pain.  Gastrointestinal: Negative for nausea, vomiting, abdominal pain and diarrhea.  Genitourinary: Negative for dysuria and hematuria.  Musculoskeletal: Negative for back pain and gait problem.  Skin: Negative for color change.  Neurological: Negative for dizziness and headaches.  Hematological: Negative  for adenopathy.  Psychiatric/Behavioral: Positive for suicidal ideas. Negative for behavioral problems.  All other systems reviewed and are negative.    Allergies  Review of patient's allergies indicates no known allergies.  Home Medications   Current Outpatient Rx  Name  Route  Sig  Dispense  Refill  . amoxicillin-clavulanate (AUGMENTIN) 500-125 MG per tablet   Oral   Take 1 tablet (500 mg total) by mouth 2 (two) times daily. Patient has eight doses of antibiotic therapy left. Take one tablet twice daily until finished for your dental infection.         . gabapentin (NEURONTIN) 400 MG capsule   Oral   Take 2 capsules (800 mg total) by mouth 4 (four) times daily -  before meals and at bedtime.   240 capsule   0   . insulin glargine (LANTUS) 100 UNIT/ML injection   Subcutaneous   Inject 0.4-0.44 mLs (40-44 Units total) into the skin 2 (two) times daily. Injects 40 units every morning and injects 44 units every night at bedtime.   10 mL   12   . lisinopril (PRINIVIL,ZESTRIL) 5 MG tablet   Oral   Take 1 tablet (5 mg total) by mouth daily.         . metFORMIN (GLUCOPHAGE) 500 MG tablet   Oral   Take 2 tablets (1,000 mg total) by mouth 2 (two) times daily with a meal.         . mirtazapine (REMERON) 30 MG tablet   Oral   Take 1 tablet (  30 mg total) by mouth at bedtime.   30 tablet   0   . naltrexone (DEPADE) 50 MG tablet   Oral   Take 1 tablet (50 mg total) by mouth daily.   30 tablet   0    BP 114/78  Pulse 110  Temp(Src) 98.2 F (36.8 C)  Resp 16  Ht 5\' 2"  (1.575 m)  Wt 150 lb (68.04 kg)  BMI 27.43 kg/m2  SpO2 97%  LMP 01/31/2013 Physical Exam  Nursing note and vitals reviewed. Constitutional: She is oriented to person, place, and time. She appears well-developed and well-nourished.  HENT:  Head: Normocephalic.  Mouth/Throat: No oropharyngeal exudate.  Eyes: Conjunctivae and EOM are normal. Pupils are equal, round, and reactive to light.  Neck:  Normal range of motion. Neck supple.  Cardiovascular: Normal rate, regular rhythm, normal heart sounds and intact distal pulses.  Exam reveals no gallop and no friction rub.   No murmur heard. Pulmonary/Chest: Effort normal and breath sounds normal. No respiratory distress. She has no wheezes.  Abdominal: Soft. Bowel sounds are normal. There is no tenderness. There is no rebound and no guarding.  Musculoskeletal: Normal range of motion. She exhibits no edema and no tenderness.  Neurological: She is alert and oriented to person, place, and time.  Skin: Skin is warm and dry.  Psychiatric: Her speech is not rapid and/or pressured. She expresses suicidal ideation.    ED Course  Procedures (including critical care time) Labs Review Labs Reviewed  CBC - Abnormal; Notable for the following:    Hemoglobin 9.8 (*)    HCT 30.0 (*)    MCV 67.7 (*)    MCH 22.1 (*)    RDW 17.1 (*)    Platelets 450 (*)    All other components within normal limits  COMPREHENSIVE METABOLIC PANEL - Abnormal; Notable for the following:    Glucose, Bld 190 (*)    Creatinine, Ser 0.43 (*)    Total Bilirubin 0.1 (*)    All other components within normal limits  SALICYLATE LEVEL - Abnormal; Notable for the following:    Salicylate Lvl <2.0 (*)    All other components within normal limits  URINE RAPID DRUG SCREEN (HOSP PERFORMED) - Abnormal; Notable for the following:    Benzodiazepines POSITIVE (*)    All other components within normal limits  GLUCOSE, CAPILLARY - Abnormal; Notable for the following:    Glucose-Capillary 186 (*)    All other components within normal limits  GLUCOSE, CAPILLARY - Abnormal; Notable for the following:    Glucose-Capillary 143 (*)    All other components within normal limits  GLUCOSE, CAPILLARY - Abnormal; Notable for the following:    Glucose-Capillary 202 (*)    All other components within normal limits  ACETAMINOPHEN LEVEL  ETHANOL  POCT PREGNANCY, URINE   Imaging Review No  results found.  MDM   1. Alcoholism with alcohol dependence   2. Chronic posttraumatic stress disorder   3. Generalized anxiety disorder   4. Substance induced mood disorder   5. IDDM (insulin dependent diabetes mellitus)    6:17 AM 37 y.o. female with recent discharge from behavioral health yesterday who presents with suicidal ideations. The patient states that if we discharge her she will run out in front of a moving vehicle. She also defecated on the sidewalk in front of the hospital. She is afebrile and vital signs are unremarkable here. She states that she had 2 alcoholic drinks several hours ago. She states  that she felt better when she was discharged but gradually throughout the day began feeling suicidal again. Will get lab work and consult psychiatry. Will fill out IVC paperwork.    Junius Argyle, MD 02/17/13 [phone]

## 2013-02-17 NOTE — ED Notes (Signed)
Pt eating lunch, still very sleepy.

## 2013-02-17 NOTE — ED Notes (Signed)
Dr Lolly Mustache and Alba PA intosee

## 2013-02-17 NOTE — Consult Note (Signed)
[facility] Face-to-Face Psychiatry Consult   Reason for Consult:  Suicidal ideations Referring Physician:  ED MD [name] is an 37 y.o. female.  Assessment: AXIS I:  Alcohol Abuse, Anxiety Disorder NOS and Substance Induced Mood Disorder AXIS II:  Cluster B Traits AXIS III:   Past Medical History  Diagnosis Date  . Hypertension   . Diabetes mellitus without complication   . Mental disorder   . Depression    AXIS IV:  housing problems, other psychosocial or environmental problems, problems related to social environment and problems with primary support group AXIS V:  61-70 mild symptoms  Plan:  No evidence of imminent risk to self or others at present.   Patient does not meet criteria for psychiatric inpatient admission.  Subjective:   Carolyn Ayala is a 37 y.o. female patient should be discharge.  HPI:  Patient difficult to arouse fully but every time the MD and NP were going to leave, she would become more alert.  She is alert and oriented, follows directions appropriately, mumbled at first, and initially stated she was doing "Ok".  When asked about her discharging yesterday, she said she did not have a safe place to go and she does not know why but she drank two 40 oz beers even though she stated she is suppose to go to [facility] on the 9th of September for rehab.  Ms. Sundeen stated she was having suicidal thoughts "on and off" and hears voices telling her that she is going to get her son, Carolyn Ayala, back.  Patient is malingering at this time and does not warrant admission.  Yesterday, she refused to be discharged from the unit due to being homeless (patient was given a list of shelters) and the police had to be called.  Ms. Flammer should return home and follow-up with her discharge appointments and her appointment at [facility] on the 9th.     Past Psychiatric History: Past Medical History  Diagnosis Date  . Hypertension   . Diabetes mellitus without complication   . Mental  disorder   . Depression     reports that she has been smoking.  She does not have any smokeless tobacco history on file. She reports that  drinks alcohol. She reports that she does not use illicit drugs. History reviewed. No pertinent family history.         Allergies:  No Known Allergies  ACT Assessment Complete:  No:   Past Psychiatric History: Diagnosis:  Alcohol dependency, depression, substance induced mood disorder, Cluster B personality traits  Hospitalizations:  [facility]  Outpatient Care:  [facility]  Substance Abuse Care:  [facility]  Self-Mutilation:  None  Suicidal Attempts:  None  Homicidal Behaviors:  None   Violent Behaviors:  None   Marital Status:  Single  Employed/Unemployed:  Unemployed  Objective: Blood pressure 105/72, pulse 97, temperature 98.2 F (36.8 C), resp. rate 15, height 5\' 2"  (1.575 m), weight 68.04 kg (150 lb), last menstrual period 01/31/2013, SpO2 100.00%.Body mass index is 27.43 kg/(m^2). Results for orders placed during the hospital encounter of 02/17/13 (from the past 72 hour(s))  URINE RAPID DRUG SCREEN (HOSP PERFORMED)     Status: Abnormal   Collection Time    02/17/13  5:58 AM      Result Value Range   Opiates NONE DETECTED  NONE DETECTED   Cocaine NONE DETECTED  NONE DETECTED   Benzodiazepines POSITIVE (*) NONE DETECTED   Amphetamines NONE DETECTED  NONE DETECTED   Tetrahydrocannabinol NONE DETECTED  NONE DETECTED   Barbiturates NONE DETECTED  NONE DETECTED   Comment:            DRUG SCREEN FOR MEDICAL PURPOSES     ONLY.  IF CONFIRMATION IS NEEDED     FOR ANY PURPOSE, NOTIFY LAB     WITHIN 5 DAYS.                LOWEST DETECTABLE LIMITS     FOR URINE DRUG SCREEN     Drug Class       Cutoff (ng/mL)     Amphetamine      1000     Barbiturate      200     Benzodiazepine   200     Tricyclics       300     Opiates          300     Cocaine          300     THC              50  POCT PREGNANCY, URINE     Status: None   Collection Time     02/17/13  6:02 AM      Result Value Range   Preg Test, Ur NEGATIVE  NEGATIVE   Comment:            THE SENSITIVITY OF THIS     METHODOLOGY IS >24 mIU/mL  ACETAMINOPHEN LEVEL     Status: None   Collection Time    02/17/13  6:20 AM      Result Value Range   Acetaminophen (Tylenol), Serum <15.0  10 - 30 ug/mL   Comment:            THERAPEUTIC CONCENTRATIONS VARY     SIGNIFICANTLY. A RANGE OF 10-30     ug/mL MAY BE AN EFFECTIVE     CONCENTRATION FOR MANY PATIENTS.     HOWEVER, SOME ARE BEST TREATED     AT CONCENTRATIONS OUTSIDE THIS     RANGE.     ACETAMINOPHEN CONCENTRATIONS     >150 ug/mL AT 4 HOURS AFTER     INGESTION AND >50 ug/mL AT 12     HOURS AFTER INGESTION ARE     OFTEN ASSOCIATED WITH TOXIC     REACTIONS.  CBC     Status: Abnormal   Collection Time    02/17/13  6:20 AM      Result Value Range   WBC 8.9  4.0 - 10.5 K/uL   RBC 4.43  3.87 - 5.11 MIL/uL   Hemoglobin 9.8 (*) 12.0 - 15.0 g/dL   HCT 46.9 (*) 62.9 - 52.8 %   MCV 67.7 (*) 78.0 - 100.0 fL   MCH 22.1 (*) 26.0 - 34.0 pg   MCHC 32.7  30.0 - 36.0 g/dL   RDW 41.3 (*) 24.4 - 01.0 %   Platelets 450 (*) 150 - 400 K/uL  COMPREHENSIVE METABOLIC PANEL     Status: Abnormal   Collection Time    02/17/13  6:20 AM      Result Value Range   Sodium 137  135 - 145 mEq/L   Potassium 4.0  3.5 - 5.1 mEq/L   Chloride 102  96 - 112 mEq/L   CO2 24  19 - 32 mEq/L   Glucose, Bld 190 (*) 70 - 99 mg/dL   BUN 8  6 - 23 mg/dL   Creatinine, Ser 2.72 (*) 0.50 -  1.10 mg/dL   Calcium 9.4  8.4 - 96.0 mg/dL   Total Protein 7.7  6.0 - 8.3 g/dL   Albumin 3.5  3.5 - 5.2 g/dL   AST 26  0 - 37 U/L   ALT 35  0 - 35 U/L   Alkaline Phosphatase 90  39 - 117 U/L   Total Bilirubin 0.1 (*) 0.3 - 1.2 mg/dL   GFR calc non Af Amer >90  >90 mL/min   GFR calc Af Amer >90  >90 mL/min   Comment: (NOTE)     The eGFR has been calculated using the CKD EPI equation.     This calculation has not been validated in all clinical situations.     eGFR's  persistently <90 mL/min signify possible Chronic Kidney     Disease.  ETHANOL     Status: None   Collection Time    02/17/13  6:20 AM      Result Value Range   Alcohol, Ethyl (B) <11  0 - 11 mg/dL   Comment:            LOWEST DETECTABLE LIMIT FOR     SERUM ALCOHOL IS 11 mg/dL     FOR MEDICAL PURPOSES ONLY  SALICYLATE LEVEL     Status: Abnormal   Collection Time    02/17/13  6:20 AM      Result Value Range   Salicylate Lvl <2.0 (*) 2.8 - 20.0 mg/dL  GLUCOSE, CAPILLARY     Status: Abnormal   Collection Time    02/17/13  6:32 AM      Result Value Range   Glucose-Capillary 186 (*) 70 - 99 mg/dL   Comment 1 Notify RN    GLUCOSE, CAPILLARY     Status: Abnormal   Collection Time    02/17/13  8:04 AM      Result Value Range   Glucose-Capillary 143 (*) 70 - 99 mg/dL   Labs are reviewed and are pertinent for no physical ailments.  Current Facility-Administered Medications  Medication Dose Route Frequency Provider Last Rate Last Dose  . FLUoxetine (PROZAC) capsule 40 mg  40 mg Oral q morning - 10a Junius Argyle, MD      . gabapentin (NEURONTIN) capsule 1,000 mg  1,000 mg Oral TID Junius Argyle, MD      . hydrOXYzine (ATARAX/VISTARIL) tablet 100 mg  100 mg Oral Q6H PRN Junius Argyle, MD      . insulin aspart (novoLOG) injection 0-24 Units  0-24 Units Subcutaneous TID WC Toy Baker, MD      . insulin glargine (LANTUS) injection 44 Units  44 Units Subcutaneous QHS Junius Argyle, MD      . insulin glargine (LANTUS) injection 46 Units  46 Units Subcutaneous Daily Junius Argyle, MD      . lisinopril (PRINIVIL,ZESTRIL) tablet 5 mg  5 mg Oral Daily Junius Argyle, MD      . metFORMIN (GLUCOPHAGE) tablet 1,000 mg  1,000 mg Oral BID WC Junius Argyle, MD      . mirtazapine (REMERON) tablet 30 mg  30 mg Oral QHS Junius Argyle, MD      . traMADol Janean Sark) tablet 100 mg  100 mg Oral Q6H PRN Junius Argyle, MD       Current Outpatient Prescriptions   Medication Sig Dispense Refill  . amoxicillin-clavulanate (AUGMENTIN) 500-125 MG per tablet Take 1 tablet (500 mg total) by mouth 2 (two) times daily. Patient  has eight doses of antibiotic therapy left. Take one tablet twice daily until finished for your dental infection.      Marland Kitchen FLUoxetine (PROZAC) 40 MG capsule Take 40 mg by mouth every morning.      . gabapentin (NEURONTIN) 400 MG capsule Take 1,000 mg by mouth 3 (three) times daily.      . hydrOXYzine (ATARAX/VISTARIL) 50 MG tablet Take 100 mg by mouth every 6 (six) hours as needed for anxiety.      . insulin glargine (LANTUS) 100 UNIT/ML injection Inject 44-46 Units into the skin 2 (two) times daily. Inject 46 units in the morning and 44 units at night      . insulin regular (NOVOLIN R,HUMULIN R) 100 units/mL injection Inject 11-17 Units into the skin 3 (three) times daily before meals. Sliding scale; 50-no units, 200-17 units      . lisinopril (PRINIVIL,ZESTRIL) 5 MG tablet Take 1 tablet (5 mg total) by mouth daily.      . metFORMIN (GLUCOPHAGE) 500 MG tablet Take 2 tablets (1,000 mg total) by mouth 2 (two) times daily with a meal.      . mirtazapine (REMERON) 30 MG tablet Take 1 tablet (30 mg total) by mouth at bedtime.  30 tablet  0  . traMADol (ULTRAM) 50 MG tablet Take 100 mg by mouth every 6 (six) hours as needed for pain.      Marland Kitchen triamcinolone cream (KENALOG) 0.1 % Apply 1 application topically 2 (two) times daily as needed (rash).        Psychiatric Specialty Exam:     Blood pressure 105/72, pulse 97, temperature 98.2 F (36.8 C), resp. rate 15, height 5\' 2"  (1.575 m), weight 68.04 kg (150 lb), last menstrual period 01/31/2013, SpO2 100.00%.Body mass index is 27.43 kg/(m^2).  General Appearance: Disheveled  Eye Solicitor::  Fair  Speech:  Normal Rate  Volume:  Normal  Mood:  Anxious  Affect:  Congruent  Thought Process:  Coherent  Orientation:  Full (Time, Place, and Person)  Thought Content:  WDL  Suicidal Thoughts:  No   Homicidal Thoughts:  No  Memory:  Immediate;   Fair Recent;   Fair Remote;   Fair  Judgement:  Fair  Insight:  Lacking  Psychomotor Activity:  Normal  Concentration:  Fair  Recall:  Fair  Akathisia:  No  Handed:  Right  AIMS (if indicated):     Assets:  Resilience  Sleep:      Treatment Plan Summary: Daily contact with patient to assess and evaluate symptoms and progress in treatment Medication management Recommend discharge to shelter with her follow-up appointments from discharge yesterday. Nanine Means, PMH-NP 02/17/2013 9:52 AM  I have personally seen the patient and agreed with the findings and involved in the treatment plan. Kathryne Sharper, MD

## 2013-02-17 NOTE — ED Notes (Addendum)
Written dc instructions reviewed w/ pt. Written  Homeless/shelter information given.  Pt encouraged to follow up and take her medications as directed and keep her appt on Tuesday at [facility].  Pt verbalized understanding.  Pt alert,  ambulatory w/o difficulty to dc window w/ mHt, belongings returned after leaving.  Bus pass given

## 2013-02-20 NOTE — Progress Notes (Signed)
Patient Discharge Instructions:  After Visit Summary (AVS):   Faxed to:  02/20/13 Discharge Summary Note:   Faxed to:  02/20/13 Psychiatric Admission Assessment Note:   Faxed to:  02/20/13 Suicide Risk Assessment - Discharge Assessment:   Faxed to:  02/20/13 Faxed/Sent to the Next Level Care provider:  02/20/13 Faxed to [facility] @ [phone] Faxed to [facility] @ [phone]  Jerelene Redden, 02/20/2013, 3:39 PM

## 2013-03-16 ENCOUNTER — Emergency Department (HOSPITAL_COMMUNITY): Payer: Self-pay

## 2013-03-16 ENCOUNTER — Encounter (HOSPITAL_COMMUNITY): Payer: Self-pay | Admitting: Emergency Medicine

## 2013-03-16 ENCOUNTER — Emergency Department (HOSPITAL_COMMUNITY)
Admission: EM | Admit: 2013-03-16 | Discharge: 2013-03-18 | Disposition: A | Discharge status: Other Acute Inpatient Hospital | Payer: Self-pay | Attending: Emergency Medicine | Admitting: Emergency Medicine

## 2013-03-16 DIAGNOSIS — F1210 Cannabis abuse, uncomplicated: Secondary | ICD-10-CM

## 2013-03-16 DIAGNOSIS — E119 Type 2 diabetes mellitus without complications: Secondary | ICD-10-CM | POA: Insufficient documentation

## 2013-03-16 DIAGNOSIS — S8000XA Contusion of unspecified knee, initial encounter: Secondary | ICD-10-CM | POA: Insufficient documentation

## 2013-03-16 DIAGNOSIS — T7491XA Unspecified adult maltreatment, confirmed, initial encounter: Secondary | ICD-10-CM | POA: Insufficient documentation

## 2013-03-16 DIAGNOSIS — T1491XA Suicide attempt, initial encounter: Secondary | ICD-10-CM

## 2013-03-16 DIAGNOSIS — F332 Major depressive disorder, recurrent severe without psychotic features: Secondary | ICD-10-CM

## 2013-03-16 DIAGNOSIS — S0993XA Unspecified injury of face, initial encounter: Secondary | ICD-10-CM | POA: Insufficient documentation

## 2013-03-16 DIAGNOSIS — IMO0002 Reserved for concepts with insufficient information to code with codable children: Secondary | ICD-10-CM | POA: Insufficient documentation

## 2013-03-16 DIAGNOSIS — Z3202 Encounter for pregnancy test, result negative: Secondary | ICD-10-CM | POA: Insufficient documentation

## 2013-03-16 DIAGNOSIS — S060X9A Concussion with loss of consciousness of unspecified duration, initial encounter: Secondary | ICD-10-CM | POA: Insufficient documentation

## 2013-03-16 DIAGNOSIS — I10 Essential (primary) hypertension: Secondary | ICD-10-CM | POA: Insufficient documentation

## 2013-03-16 DIAGNOSIS — R45851 Suicidal ideations: Secondary | ICD-10-CM | POA: Insufficient documentation

## 2013-03-16 DIAGNOSIS — F411 Generalized anxiety disorder: Secondary | ICD-10-CM

## 2013-03-16 DIAGNOSIS — T7492XA Unspecified child maltreatment, confirmed, initial encounter: Secondary | ICD-10-CM | POA: Insufficient documentation

## 2013-03-16 DIAGNOSIS — F1010 Alcohol abuse, uncomplicated: Secondary | ICD-10-CM

## 2013-03-16 LAB — GLUCOSE, CAPILLARY: Glucose-Capillary: 220 mg/dL — ABNORMAL HIGH (ref 70–99)

## 2013-03-16 MED ORDER — ACETAMINOPHEN 325 MG PO TABS
650.0000 mg | ORAL_TABLET | Freq: Once | ORAL | Status: AC
  Administered 2013-03-16: 650 mg via ORAL
  Filled 2013-03-16: qty 2
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
## 2013-03-17 ENCOUNTER — Encounter (HOSPITAL_COMMUNITY): Payer: Self-pay | Admitting: Registered Nurse

## 2013-03-17 ENCOUNTER — Other Ambulatory Visit: Payer: Self-pay

## 2013-03-17 DIAGNOSIS — F339 Major depressive disorder, recurrent, unspecified: Secondary | ICD-10-CM

## 2013-03-17 DIAGNOSIS — F4310 Post-traumatic stress disorder, unspecified: Secondary | ICD-10-CM

## 2013-03-17 DIAGNOSIS — F411 Generalized anxiety disorder: Secondary | ICD-10-CM

## 2013-03-17 LAB — GLUCOSE, CAPILLARY
Glucose-Capillary: 202 mg/dL — ABNORMAL HIGH (ref 70–99)
Glucose-Capillary: 370 mg/dL — ABNORMAL HIGH (ref 70–99)
Glucose-Capillary: 279 mg/dL — ABNORMAL HIGH (ref 70–99)
Glucose-Capillary: 193 mg/dL — ABNORMAL HIGH (ref 70–99)

## 2013-03-17 LAB — CBC WITH DIFFERENTIAL/PLATELET
Basophils Absolute: 0.1 10*3/uL (ref 0.0–0.1)
Basophils Relative: 1 % (ref 0–1)
Eosinophils Absolute: 0.1 10*3/uL (ref 0.0–0.7)
Eosinophils Relative: 2 % (ref 0–5)
HCT: 33.2 % — ABNORMAL LOW (ref 36.0–46.0)
Hemoglobin: 10.6 g/dL — ABNORMAL LOW (ref 12.0–15.0)
Lymphocytes Relative: 40 % (ref 12–46)
Lymphs Abs: 2.6 10*3/uL (ref 0.7–4.0)
MCH: 22.6 pg — ABNORMAL LOW (ref 26.0–34.0)
MCHC: 31.9 g/dL (ref 30.0–36.0)
MCV: 70.6 fL — ABNORMAL LOW (ref 78.0–100.0)
Monocytes Absolute: 0.8 10*3/uL (ref 0.1–1.0)
Monocytes Relative: 12 % (ref 3–12)
Neutro Abs: 2.9 10*3/uL (ref 1.7–7.7)
Neutrophils Relative %: 45 % (ref 43–77)
Platelets: 316 10*3/uL (ref 150–400)
RBC: 4.7 MIL/uL (ref 3.87–5.11)
RDW: 21.8 % — ABNORMAL HIGH (ref 11.5–15.5)
WBC: 6.5 10*3/uL (ref 4.0–10.5)

## 2013-03-17 LAB — BASIC METABOLIC PANEL
BUN: 9 mg/dL (ref 6–23)
CO2: 24 mEq/L (ref 19–32)
Calcium: 8.9 mg/dL (ref 8.4–10.5)
Chloride: 97 mEq/L (ref 96–112)
Creatinine, Ser: 0.65 mg/dL (ref 0.50–1.10)
GFR calc Af Amer: >90 mL/min (ref >90)
GFR calc non Af Amer: >90 mL/min (ref >90)
Glucose, Bld: 297 mg/dL — ABNORMAL HIGH (ref 70–99)
Potassium: 4.1 mEq/L (ref 3.5–5.1)
Sodium: 132 mEq/L — ABNORMAL LOW (ref 135–145)

## 2013-03-17 LAB — SALICYLATE LEVEL: Salicylate Lvl: <2 mg/dL — ABNORMAL LOW (ref 2.8–20.0)

## 2013-03-17 LAB — ACETAMINOPHEN LEVEL: Acetaminophen (Tylenol), Serum: <15 ug/mL (ref 10–30)

## 2013-03-17 LAB — ETHANOL
Alcohol, Ethyl (B): 61 mg/dL — ABNORMAL HIGH (ref 0–11)
Alcohol, Ethyl (B): <11 mg/dL (ref 0–11)

## 2013-03-17 LAB — RAPID URINE DRUG SCREEN, HOSP PERFORMED
Amphetamines: NOT DETECTED
Barbiturates: NOT DETECTED
Benzodiazepines: POSITIVE — AB
Cocaine: NOT DETECTED
Opiates: NOT DETECTED
Tetrahydrocannabinol: NOT DETECTED

## 2013-03-17 LAB — PREGNANCY, URINE: Preg Test, Ur: NEGATIVE

## 2013-03-17 MED ORDER — TRAZODONE HCL 50 MG PO TABS: 25.0000 mg | ORAL_TABLET | Freq: Every day | ORAL | Status: DC | PRN

## 2013-03-17 MED ORDER — METFORMIN HCL 500 MG PO TABS
1000.0000 mg | ORAL_TABLET | Freq: Two times a day (BID) | ORAL | Status: DC
  Administered 2013-03-17 – 2013-03-18 (×3): 1000 mg via ORAL
  Filled 2013-03-17 (×4): qty 2

## 2013-03-17 MED ORDER — ONDANSETRON HCL 4 MG PO TABS: 4.0000 mg | ORAL_TABLET | Freq: Three times a day (TID) | ORAL | Status: DC | PRN

## 2013-03-17 MED ORDER — LORAZEPAM 1 MG PO TABS
1.0000 mg | ORAL_TABLET | Freq: Three times a day (TID) | ORAL | Status: DC | PRN
  Administered 2013-03-17 – 2013-03-18 (×4): 1 mg via ORAL
  Filled 2013-03-17 (×4): qty 1

## 2013-03-17 MED ORDER — MIRTAZAPINE 30 MG PO TABS
30.0000 mg | ORAL_TABLET | Freq: Every day | ORAL | Status: DC | PRN
  Administered 2013-03-17: 30 mg via ORAL
  Filled 2013-03-17: qty 1

## 2013-03-17 MED ORDER — INSULIN ASPART 100 UNIT/ML ~~LOC~~ SOLN: 0.0000 [IU] | Freq: Three times a day (TID) | SUBCUTANEOUS | Status: DC

## 2013-03-17 MED ORDER — HYDROXYZINE HCL 25 MG PO TABS
50.0000 mg | ORAL_TABLET | Freq: Three times a day (TID) | ORAL | Status: DC | PRN
  Administered 2013-03-17 – 2013-03-18 (×2): 50 mg via ORAL
  Filled 2013-03-17 (×2): qty 2

## 2013-03-17 MED ORDER — INSULIN GLARGINE 100 UNIT/ML ~~LOC~~ SOLN
40.0000 [IU] | Freq: Every day | SUBCUTANEOUS | Status: DC
  Administered 2013-03-17 – 2013-03-18 (×2): 40 [IU] via SUBCUTANEOUS
  Filled 2013-03-17 (×3): qty 0.4

## 2013-03-17 MED ORDER — INSULIN GLARGINE 100 UNIT/ML ~~LOC~~ SOLN
44.0000 [IU] | Freq: Every day | SUBCUTANEOUS | Status: DC
  Administered 2013-03-17: 44 [IU] via SUBCUTANEOUS
  Filled 2013-03-17 (×2): qty 0.44

## 2013-03-17 MED ORDER — IBUPROFEN 200 MG PO TABS
600.0000 mg | ORAL_TABLET | Freq: Three times a day (TID) | ORAL | Status: DC | PRN
  Administered 2013-03-17 – 2013-03-18 (×3): 600 mg via ORAL
  Filled 2013-03-17 (×3): qty 3

## 2013-03-17 MED ORDER — LISINOPRIL 5 MG PO TABS
5.0000 mg | ORAL_TABLET | Freq: Every morning | ORAL | Status: DC
  Administered 2013-03-17 – 2013-03-18 (×2): 5 mg via ORAL
  Filled 2013-03-17 (×2): qty 1

## 2013-03-17 MED ORDER — LORATADINE 10 MG PO TABS
10.0000 mg | ORAL_TABLET | Freq: Every morning | ORAL | Status: DC
  Administered 2013-03-17 – 2013-03-18 (×2): 10 mg via ORAL
  Filled 2013-03-17 (×2): qty 1

## 2013-03-17 MED ORDER — ADULT MULTIVITAMIN W/MINERALS CH
1.0000 | ORAL_TABLET | Freq: Every morning | ORAL | Status: DC
  Administered 2013-03-17 – 2013-03-18 (×2): 1 via ORAL
  Filled 2013-03-17 (×2): qty 1

## 2013-03-17 MED ORDER — INSULIN GLARGINE 100 UNIT/ML ~~LOC~~ SOLN: 40.0000 [IU] | Freq: Every morning | SUBCUTANEOUS | Status: DC

## 2013-03-17 MED ORDER — FLUOXETINE HCL 20 MG PO CAPS
40.0000 mg | ORAL_CAPSULE | Freq: Every morning | ORAL | Status: DC
Start: 2013-03-17 — End: 2013-03-18
  Administered 2013-03-17 – 2013-03-18 (×2): 40 mg via ORAL
  Filled 2013-03-17 (×2): qty 2

## 2013-03-17 MED ORDER — ACETAMINOPHEN 325 MG PO TABS: 650.0000 mg | ORAL_TABLET | ORAL | Status: DC | PRN

## 2013-03-17 MED ORDER — INSULIN LISPRO 100 UNIT/ML ~~LOC~~ SOLN: 1.0000 [IU] | Freq: Three times a day (TID) | SUBCUTANEOUS | Status: DC | PRN

## 2013-03-17 MED ORDER — GABAPENTIN 400 MG PO CAPS
800.0000 mg | ORAL_CAPSULE | Freq: Four times a day (QID) | ORAL | Status: DC
  Administered 2013-03-17 – 2013-03-18 (×6): 800 mg via ORAL
  Filled 2013-03-17 (×7): qty 2

## 2013-03-17 MED ORDER — INSULIN ASPART 100 UNIT/ML ~~LOC~~ SOLN
0.0000 [IU] | Freq: Three times a day (TID) | SUBCUTANEOUS | Status: DC
  Administered 2013-03-17: 5 [IU] via SUBCUTANEOUS
  Administered 2013-03-18: 2 [IU] via SUBCUTANEOUS
  Administered 2013-03-18: 5 [IU] via SUBCUTANEOUS
  Filled 2013-03-17 (×4): qty 1
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
## 2013-03-17 NOTE — ED Notes (Signed)
Miss Carolyn Ayala from [employer] notified Nursing of a half full bottle of Old English Ale that was hidden in the bottom of the trash can underneath the can liners.  It is unknown if the pt had consumed any of the beverage or if she did how much.

## 2013-03-17 NOTE — ED Notes (Signed)
Pt belongings consisting of a bra, a black shirt , a purse with medications that have been sent to pharmacy, instructions for new diabetics, a dew rag, black a pair of blue jean slip on shoes, a blue jacket.  It the purse no id or money where found.  Pt has a bible several combs and a lighter.  Misc paperwork is also in the purse.

## 2013-03-17 NOTE — ED Notes (Addendum)
Beth from poison control called to check on patient status. Satisfied with labs and her behaviors. They have signed off on her case.

## 2013-03-17 NOTE — ED Notes (Signed)
Pt denies SI/HI/AH/VH/  Pt denies substance abuse

## 2013-03-17 NOTE — [facility] Counselor (Signed)
Writer attempted to assess the patient but was unsuccessful due to the patients slurred speech and her inability to stay awake.    Patient informed the nurse that the assessment could not be completed.  The nurse informed the writer that the patient has not received a UDS because of her inability to use the restroom.  Therefore,it is unclear what other substances she may be under the influence of.

## 2013-03-17 NOTE — ED Notes (Signed)
[name] true identity is in question.  Pt real name after search is Carolyn Ayala.  Several charts are in the process of being merged

## 2013-03-17 NOTE — ED Notes (Signed)
EDP notified of elevated BP. Stated he would review her chart and call back.

## 2013-03-17 NOTE — ED Notes (Signed)
Pt asked for urine sample.  Pt states she is unable to void at this time.  Pt given a ham sandwich, a ginger ale.

## 2013-03-17 NOTE — ED Notes (Signed)
Pt asked for a urine sample.  Pt states she is unable to void at the moment.

## 2013-03-17 NOTE — ED Notes (Signed)
Pt aaox3.  Dr Jodi Mourning made aware that patient took meds while waiting to be seen in the waiting room.  Poison control notified and ekg ordered

## 2013-03-17 NOTE — ED Notes (Signed)
Pt told writer that while in the waiting room she consumed 2 Prozac 40mg  each even though she took one this morning, she also took 2 Neurontin 800 mg, 4 Atarax 25 mg each, Remeron 30 mg and something else she couldn't remember but there were five total. She said she wasn't trying to hurt herself but she was having chest pains. She was advised by writer when at the hospital tell front desk if having chest pain. Pt says she has too much medicine and she can't control or manage it properly and needs assistance in doing so. Advised that maybe we can get her set up with an ACT team that can help her manage her medications. She doesn't want to go to [facility] for treatment, she actually said she doesn't want to go anywhere but did tell her in lieu of her recent events and her being overwhelmed she will more than likely require inpatient treatment for stabilization.

## 2013-03-17 NOTE — ED Notes (Signed)
Pt too sleepy to assess.  Pt falling asleep while attempting to assess.  Will continue to monitor
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
## 2013-03-17 NOTE — ED Notes (Signed)
Nursing was called to room by tech due to the pt drawing up novolog.  Pt had a full syringe of novolog and stated "this is 50 units of fast acting novolog,"  Charge nurse took possession of syringe and returned novolog to bottle. When asked she stated she didn't want to live like this and "I want to die."  Pt purse removed and medications listed under "Carolyn Ayala" where discovered.  Pt states this is her real name.  Dr Jodi Mourning informed of incidence

## 2013-03-17 NOTE — ED Notes (Signed)
Notified David at poison control about meds that patient reported taken:  Poison control had no recommendations for patient at this time.  EKG done, NSR.  Dr Jodi Mourning made aware

## 2013-03-18 ENCOUNTER — Telehealth (HOSPITAL_COMMUNITY): Payer: Self-pay | Admitting: Licensed Clinical Social Worker

## 2013-03-18 DIAGNOSIS — F332 Major depressive disorder, recurrent severe without psychotic features: Secondary | ICD-10-CM

## 2013-03-18 LAB — GLUCOSE, CAPILLARY
Glucose-Capillary: 198 mg/dL — ABNORMAL HIGH (ref 70–99)
Glucose-Capillary: 72 mg/dL (ref 70–99)
Glucose-Capillary: 171 mg/dL — ABNORMAL HIGH (ref 70–99)
Glucose-Capillary: 293 mg/dL — ABNORMAL HIGH (ref 70–99)

## 2013-03-18 NOTE — Progress Notes (Signed)
[facility] provided pt with a [facility] Orange Card application.  °

## 2013-03-18 NOTE — ED Notes (Signed)
Report to [place] at [facility].  Call to EDP for d/c.

## 2013-03-18 NOTE — Progress Notes (Signed)
Pt accepted to [facility] by Dr. Deneen Harts room 2254. Pt to be transported voluntarily by [facility] and taken to 1st floor admitting for direct admit to [facility].   .Catha Gosselin, LCSW [phone]  ED CSW 03/18/2013 1512pm
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
## 2013-03-18 NOTE — Progress Notes (Addendum)
Pt referred to [facility], pending review.  Pt referred to [facility] pending reivew.  CSW lm for [facility], and faxed referral.     .Frutoso Schatz [phone]  ED CSW .03/18/2013 11:58am   Pt referred to [facility] pending review.  Pt referred to [facility] pending review.  No beds available at [facility], [place], [facility], [place].   Catha Gosselin, LCSW [phone]  ED CSW .03/18/2013 1300pm

## 2013-03-18 NOTE — ED Provider Notes (Signed)
Carolyn Ayala Patient is a 37 y.o. female who is being evaluated for major depression.  She was seen earlier today by the house psychiatrist. She has an assisted to a psychiatric facility, [facility]. She will be transferred there under voluntary status.   Flint Melter, MD 03/18/13 [phone]

## 2013-03-18 NOTE — ED Notes (Signed)
C/O H/A at #8 and anxiety.  PRN medications requested and received.

## 2013-03-25 ENCOUNTER — Encounter (HOSPITAL_COMMUNITY): Payer: Self-pay | Admitting: Emergency Medicine

## 2015-04-16 ENCOUNTER — Other Ambulatory Visit: Payer: Self-pay

## 2015-04-16 ENCOUNTER — Emergency Department (HOSPITAL_COMMUNITY)
Admission: EM | Admit: 2015-04-16 | Discharge: 2015-04-19 | Disposition: A | Discharge status: Other Acute Inpatient Hospital | Payer: Federal, State, Local not specified - Other | Attending: Emergency Medicine | Admitting: Emergency Medicine

## 2015-04-16 ENCOUNTER — Encounter (HOSPITAL_COMMUNITY): Payer: Self-pay | Admitting: Emergency Medicine

## 2015-04-16 DIAGNOSIS — Z3202 Encounter for pregnancy test, result negative: Secondary | ICD-10-CM | POA: Insufficient documentation

## 2015-04-16 DIAGNOSIS — F99 Mental disorder, not otherwise specified: Secondary | ICD-10-CM | POA: Insufficient documentation

## 2015-04-16 DIAGNOSIS — Z794 Long term (current) use of insulin: Secondary | ICD-10-CM | POA: Insufficient documentation

## 2015-04-16 DIAGNOSIS — R110 Nausea: Secondary | ICD-10-CM | POA: Insufficient documentation

## 2015-04-16 DIAGNOSIS — F329 Major depressive disorder, single episode, unspecified: Secondary | ICD-10-CM | POA: Insufficient documentation

## 2015-04-16 DIAGNOSIS — Z79899 Other long term (current) drug therapy: Secondary | ICD-10-CM | POA: Insufficient documentation

## 2015-04-16 DIAGNOSIS — R45851 Suicidal ideations: Secondary | ICD-10-CM

## 2015-04-16 DIAGNOSIS — R079 Chest pain, unspecified: Secondary | ICD-10-CM | POA: Insufficient documentation

## 2015-04-16 DIAGNOSIS — I10 Essential (primary) hypertension: Secondary | ICD-10-CM | POA: Insufficient documentation

## 2015-04-16 DIAGNOSIS — E119 Type 2 diabetes mellitus without complications: Secondary | ICD-10-CM | POA: Insufficient documentation

## 2015-04-16 DIAGNOSIS — R000 Tachycardia, unspecified: Secondary | ICD-10-CM | POA: Insufficient documentation

## 2015-04-16 DIAGNOSIS — R0602 Shortness of breath: Secondary | ICD-10-CM | POA: Insufficient documentation

## 2015-04-16 DIAGNOSIS — Z634 Disappearance and death of family member: Secondary | ICD-10-CM

## 2015-04-16 NOTE — ED Notes (Signed)
Per [facility] pt c/o chest pain. States it raidiated to the L arm. Sudden onset of pain, sharp in description.  ETOH pt told EMS she drank a 24 oz coors light.  Pt currently resting with her eyes closed.  Pt aroused and states that chest pain is a 5 out of 10.    CBG 488. EMS states that she is a newly diagnosed diabetic, type 1. EMS also stated that she took her insulin last around 5 pm.   ST on [facility] monitor.

## 2015-04-16 NOTE — ED Provider Notes (Signed)
CSN: [id number]   Arrival date & time 04/16/15 2333  History  By signing my name below, I, Bethel Born, attest that this documentation has been prepared under the direction and in the presence of Tomasita Crumble, MD. Electronically Signed: Bethel Born, ED Scribe. 04/17/2015. 12:16 AM.  Chief Complaint  Patient presents with  . Chest Pain    HPI The history is provided by the patient. No language interpreter was used.   Brought in by EMS, Margret Moat is a 39 y.o. female with history of HTN and DM who presents to the Emergency Department complaining of new and constant central chest pain with onset 2 hours ago while walking. She describes the pain as sharp noting that it radiates down the left arm. Associated symptoms include SOB, nausea. Pt denies vomiting. She drank a 24 oz Coors Light and denies illicit drug use. No history of MI or DVT/PE.   Past Medical History  Diagnosis Date  . Mental disorder   . Depression   . Diabetes mellitus without complication (HCC)   . Hypertension     Past Surgical History  Procedure Laterality Date  . Cesarean section      Family History  Problem Relation Age of Onset  . Depression Mother     Social History  Substance Use Topics  . Smoking status: Never Smoker   . Smokeless tobacco: None  . Alcohol Use: Yes     Comment: daily     Review of Systems  Respiratory: Positive for shortness of breath.   Cardiovascular: Positive for chest pain.  Gastrointestinal: Positive for nausea. Negative for vomiting.  All other systems reviewed and are negative. 10 Systems reviewed and all are negative for acute change except as noted in the HPI. Home Medications   Prior to Admission medications   Medication Sig Start Date End Date Taking? Authorizing Provider  amoxicillin-clavulanate (AUGMENTIN) 500-125 MG per tablet Take 1 tablet (500 mg total) by mouth 2 (two) times daily. Patient has eight doses of antibiotic therapy left. Take one  tablet twice daily until finished for your dental infection. 02/15/13   Thermon Leyland, NP  FLUoxetine (PROZAC) 40 MG capsule Take 1 capsule (40 mg total) by mouth every morning. 02/17/13   Charm Rings, NP  FLUoxetine (PROZAC) 40 MG capsule Take 40 mg by mouth every morning.    Historical Provider, MD  gabapentin (NEURONTIN) 400 MG capsule Take 3 capsules (1,200 mg total) by mouth 3 (three) times daily. 02/17/13   Charm Rings, NP  gabapentin (NEURONTIN) 800 MG tablet Take 800 mg by mouth 4 (four) times daily.    Historical Provider, MD  hydrOXYzine (ATARAX/VISTARIL) 50 MG tablet Take 100 mg by mouth every 6 (six) hours as needed for anxiety.    Historical Provider, MD  hydrOXYzine (ATARAX/VISTARIL) 50 MG tablet Take 50 mg by mouth 3 (three) times daily as needed for itching.    Historical Provider, MD  insulin glargine (LANTUS) 100 UNIT/ML injection Inject 0.44-0.46 mLs (44-46 Units total) into the skin 2 (two) times daily. Inject 46 units in the morning and 44 units at night 02/17/13   Charm Rings, NP  insulin glargine (LANTUS) 100 UNIT/ML injection Inject 40-44 Units into the skin every morning. Take 40 units in the morning and 44 units at bedtime    Historical Provider, MD  insulin lispro (HUMALOG) 100 UNIT/ML injection Inject 3-15 Units into the skin 3 (three) times daily before meals. Blood sugar less than 200  0 units 200-250 3-5 units Greater than 250 10 -15 units    Historical Provider, MD  insulin regular (NOVOLIN R,HUMULIN R) 100 units/mL injection Inject 0.11-0.17 mLs (11-17 Units total) into the skin 3 (three) times daily before meals. Sliding scale; 50-no units, 200-17 units 02/17/13   Charm Rings, NP  lisinopril (PRINIVIL,ZESTRIL) 5 MG tablet Take 1 tablet (5 mg total) by mouth daily. 02/17/13   Charm Rings, NP  lisinopril (PRINIVIL,ZESTRIL) 5 MG tablet Take 5 mg by mouth every morning.    Historical Provider, MD  loratadine (CLARITIN) 10 MG tablet Take 10 mg by mouth every morning.     Historical Provider, MD  metFORMIN (GLUCOPHAGE) 500 MG tablet Take 2 tablets (1,000 mg total) by mouth 2 (two) times daily with a meal. 02/17/13   Charm Rings, NP  metFORMIN (GLUCOPHAGE) 500 MG tablet Take 1,000 mg by mouth 2 (two) times daily with a meal.    Historical Provider, MD  mirtazapine (REMERON) 30 MG tablet Take 1 tablet (30 mg total) by mouth at bedtime. 02/17/13   Charm Rings, NP  mirtazapine (REMERON) 30 MG tablet Take 30 mg by mouth daily as needed (sleep).    Historical Provider, MD  Multiple Vitamin (MULTIVITAMIN WITH MINERALS) TABS tablet Take 1 tablet by mouth every morning.    Historical Provider, MD  traMADol (ULTRAM) 50 MG tablet Take 100 mg by mouth every 6 (six) hours as needed for pain.    Historical Provider, MD  traZODone (DESYREL) 50 MG tablet Take 25-50 mg by mouth daily as needed for sleep.    Historical Provider, MD  triamcinolone cream (KENALOG) 0.1 % Apply 1 application topically 2 (two) times daily as needed (rash).    Historical Provider, MD    Allergies  Review of patient's allergies indicates no known allergies.  Triage Vitals: Ht  (1.575 m)  Wt 150 lb (68.04 kg)  BMI 27.43 kg/m2  SpO2 96%  LMP 03/25/2015 (Exact Date)  Physical Exam  Constitutional: She is oriented to person, place, and time. She appears well-developed and well-nourished. No distress.  Clinically intoxicated  HENT:  Head: Normocephalic and atraumatic.  Nose: Nose normal.  Mouth/Throat: Oropharynx is clear and moist. No oropharyngeal exudate.  Eyes: Conjunctivae and EOM are normal. Pupils are equal, round, and reactive to light. No scleral icterus.  Pupils 6 mm and sluggish to react  Neck: Normal range of motion. Neck supple. No JVD present. No tracheal deviation present. No thyromegaly present.  Cardiovascular: Regular rhythm and normal heart sounds.  Tachycardia present.  Exam reveals no gallop and no friction rub.   No murmur heard. Pulmonary/Chest: Effort normal and  breath sounds normal. No respiratory distress. She has no wheezes. She exhibits no tenderness.  Abdominal: Soft. Bowel sounds are normal. She exhibits no distension and no mass. There is no tenderness. There is no rebound and no guarding.  Musculoskeletal: Normal range of motion. She exhibits no edema or tenderness.  Lymphadenopathy:    She has no cervical adenopathy.  Neurological: She is alert and oriented to person, place, and time. No cranial nerve deficit. She exhibits normal muscle tone.  Skin: Skin is warm and dry. No rash noted. No erythema. No pallor.  Nursing note and vitals reviewed.   ED Course  Procedures   DIAGNOSTIC STUDIES: Oxygen Saturation is 96% on RA, normal by my interpretation.    COORDINATION OF CARE: 11:55 PM Discussed treatment plan which includes CXR, EKG, labs, and IVF with  pt at bedside and pt agreed to plan.  Labs Reviewed  BASIC METABOLIC PANEL - Abnormal; Notable for the following:    Sodium 133 (*)    Chloride 99 (*)    CO2 20 (*)    Glucose, Bld 412 (*)    All other components within normal limits  CBC - Abnormal; Notable for the following:    Hemoglobin 10.7 (*)    HCT 33.7 (*)    MCV 76.6 (*)    MCH 24.3 (*)    RDW 20.8 (*)    Platelets 441 (*)    All other components within normal limits  ETHANOL - Abnormal; Notable for the following:    Alcohol, Ethyl (B) 103 (*)    All other components within normal limits  CBG MONITORING, ED - Abnormal; Notable for the following:    Glucose-Capillary 405 (*)    All other components within normal limits  CBG MONITORING, ED - Abnormal; Notable for the following:    Glucose-Capillary 227 (*)    All other components within normal limits  URINE RAPID DRUG SCREEN, HOSP PERFORMED  PREGNANCY, URINE  I-STAT TROPOININ, ED    Imaging Review Dg Chest 2 View  04/17/2015  CLINICAL DATA:  Central chest pain onset 2 hours ago while walking. EXAM: CHEST  2 VIEW COMPARISON:  None. FINDINGS: The heart size and  mediastinal contours are within normal limits. Both lungs are clear. The visualized skeletal structures are unremarkable. IMPRESSION: No active cardiopulmonary disease. Electronically Signed   By: Ellery Plunkaniel R Mitchell M.D.   On: 04/17/2015 00:14    I personally reviewed and evaluated these images and lab results as a part of my medical decision-making.   EKG Interpretation  Date/Time:    Ventricular Rate:    PR Interval:    QRS Duration:   QT Interval:    QTC Calculation:   R Axis:     Text Interpretation:      MDM   Final diagnoses:  None    patient presents to the emergency department for evaluation of her chest pain. She admits to drinking one beer prior to arrival. She denies any other illicit drugs. Patient is tachycardic and was ordered IV fluids. She was also ordered IV insulin for her hyperglycemia.   I was informed by the nursing staff that the patient is now stating she is suicidal. Will place a call to TTS for disposition. At this time, patient is medically clear.   TTS consult it, they recommend for inpatient admission for SI.  Patient will need IVC if she attempts to leave.  Given valium for anxiety.  I personally performed the services described in this documentation, which was scribed in my presence. The recorded information has been reviewed and is accurate.     Tomasita CrumbleAdeleke Josseline Reddin, MD 04/17/15 0600

## 2015-04-17 ENCOUNTER — Other Ambulatory Visit: Payer: Self-pay

## 2015-04-17 ENCOUNTER — Emergency Department (HOSPITAL_COMMUNITY): Payer: Self-pay

## 2015-04-17 LAB — BASIC METABOLIC PANEL
Anion gap: 14 (ref 5–15)
BUN: 11 mg/dL (ref 6–20)
CO2: 20 mmol/L — ABNORMAL LOW (ref 22–32)
Calcium: 9.7 mg/dL (ref 8.9–10.3)
Chloride: 99 mmol/L — ABNORMAL LOW (ref 101–111)
Creatinine, Ser: 0.57 mg/dL (ref 0.44–1.00)
GFR calc Af Amer: >60 mL/min (ref >60)
GFR calc non Af Amer: >60 mL/min (ref >60)
Glucose, Bld: 412 mg/dL — ABNORMAL HIGH (ref 65–99)
Potassium: 4.4 mmol/L (ref 3.5–5.1)
Sodium: 133 mmol/L — ABNORMAL LOW (ref 135–145)

## 2015-04-17 LAB — CBG MONITORING, ED
Glucose-Capillary: 227 mg/dL — ABNORMAL HIGH (ref 65–99)
Glucose-Capillary: 245 mg/dL — ABNORMAL HIGH (ref 65–99)
Glucose-Capillary: 248 mg/dL — ABNORMAL HIGH (ref 65–99)
Glucose-Capillary: 269 mg/dL — ABNORMAL HIGH (ref 65–99)
Glucose-Capillary: 405 mg/dL — ABNORMAL HIGH (ref 65–99)
Glucose-Capillary: 419 mg/dL — ABNORMAL HIGH (ref 65–99)
Glucose-Capillary: 439 mg/dL — ABNORMAL HIGH (ref 65–99)
Glucose-Capillary: 505 mg/dL — ABNORMAL HIGH (ref 65–99)
Glucose-Capillary: 536 mg/dL — ABNORMAL HIGH (ref 65–99)

## 2015-04-17 LAB — I-STAT TROPONIN, ED: Troponin i, poc: 0 ng/mL (ref 0.00–0.08)

## 2015-04-17 LAB — ETHANOL: Alcohol, Ethyl (B): 103 mg/dL — ABNORMAL HIGH (ref <5)

## 2015-04-17 LAB — CBC
HCT: 33.7 % — ABNORMAL LOW (ref 36.0–46.0)
Hemoglobin: 10.7 g/dL — ABNORMAL LOW (ref 12.0–15.0)
MCH: 24.3 pg — ABNORMAL LOW (ref 26.0–34.0)
MCHC: 31.8 g/dL (ref 30.0–36.0)
MCV: 76.6 fL — ABNORMAL LOW (ref 78.0–100.0)
Platelets: 441 10*3/uL — ABNORMAL HIGH (ref 150–400)
RBC: 4.4 MIL/uL (ref 3.87–5.11)
RDW: 20.8 % — ABNORMAL HIGH (ref 11.5–15.5)
WBC: 7.3 10*3/uL (ref 4.0–10.5)

## 2015-04-17 LAB — RAPID URINE DRUG SCREEN, HOSP PERFORMED
Amphetamines: NOT DETECTED
Barbiturates: NOT DETECTED
Benzodiazepines: NOT DETECTED
Cocaine: NOT DETECTED
Opiates: NOT DETECTED
Tetrahydrocannabinol: NOT DETECTED

## 2015-04-17 LAB — PREGNANCY, URINE: Preg Test, Ur: NEGATIVE

## 2015-04-17 MED ORDER — LISINOPRIL 10 MG PO TABS
5.0000 mg | ORAL_TABLET | Freq: Every day | ORAL | Status: DC
  Administered 2015-04-17 – 2015-04-19 (×3): 5 mg via ORAL
  Filled 2015-04-17 (×3): qty 1

## 2015-04-17 MED ORDER — LORAZEPAM 0.5 MG PO TABS
0.5000 mg | ORAL_TABLET | Freq: Four times a day (QID) | ORAL | Status: DC | PRN
  Administered 2015-04-17 – 2015-04-18 (×4): 0.5 mg via ORAL
  Filled 2015-04-17 (×4): qty 1

## 2015-04-17 MED ORDER — HYDROXYZINE HCL 25 MG PO TABS
100.0000 mg | ORAL_TABLET | Freq: Four times a day (QID) | ORAL | Status: DC | PRN
Start: 2015-04-17 — End: 2015-04-19
  Administered 2015-04-17 – 2015-04-19 (×7): 100 mg via ORAL
  Filled 2015-04-17 (×7): qty 4

## 2015-04-17 MED ORDER — INSULIN GLARGINE 100 UNIT/ML ~~LOC~~ SOLN
40.0000 [IU] | Freq: Every morning | SUBCUTANEOUS | Status: DC
  Administered 2015-04-17 – 2015-04-19 (×3): 40 [IU] via SUBCUTANEOUS
  Filled 2015-04-17 (×5): qty 0.4

## 2015-04-17 MED ORDER — FLUOXETINE HCL 20 MG PO CAPS
40.0000 mg | ORAL_CAPSULE | Freq: Every morning | ORAL | Status: DC
  Administered 2015-04-17 – 2015-04-19 (×3): 40 mg via ORAL
  Filled 2015-04-17 (×3): qty 2

## 2015-04-17 MED ORDER — IBUPROFEN 400 MG PO TABS
400.0000 mg | ORAL_TABLET | Freq: Once | ORAL | Status: AC
  Administered 2015-04-17: 400 mg via ORAL
  Filled 2015-04-17: qty 1

## 2015-04-17 MED ORDER — INSULIN ASPART 100 UNIT/ML ~~LOC~~ SOLN
11.0000 [IU] | Freq: Once | SUBCUTANEOUS | Status: AC
  Administered 2015-04-17: 11 [IU] via SUBCUTANEOUS
  Filled 2015-04-17: qty 1

## 2015-04-17 MED ORDER — SODIUM CHLORIDE 0.9 % IV BOLUS (SEPSIS): 2000.0000 mL | Freq: Once | INTRAVENOUS | Status: DC

## 2015-04-17 MED ORDER — METFORMIN HCL 500 MG PO TABS
1000.0000 mg | ORAL_TABLET | Freq: Two times a day (BID) | ORAL | Status: DC
  Administered 2015-04-17 – 2015-04-19 (×5): 1000 mg via ORAL
  Filled 2015-04-17 (×5): qty 2

## 2015-04-17 MED ORDER — MIRTAZAPINE 30 MG PO TABS
30.0000 mg | ORAL_TABLET | Freq: Every day | ORAL | Status: DC
  Filled 2015-04-17 (×3): qty 1

## 2015-04-17 MED ORDER — GABAPENTIN 400 MG PO CAPS
1000.0000 mg | ORAL_CAPSULE | Freq: Three times a day (TID) | ORAL | Status: DC
  Administered 2015-04-17 – 2015-04-19 (×7): 1000 mg via ORAL
  Filled 2015-04-17 (×2): qty 1
  Filled 2015-04-17 (×3): qty 2
  Filled 2015-04-17 (×2): qty 1
  Filled 2015-04-17 (×2): qty 2
  Filled 2015-04-17 (×2): qty 1
  Filled 2015-04-17: qty 2
  Filled 2015-04-17: qty 1
  Filled 2015-04-17: qty 2

## 2015-04-17 MED ORDER — INSULIN ASPART 100 UNIT/ML ~~LOC~~ SOLN
0.0000 [IU] | Freq: Three times a day (TID) | SUBCUTANEOUS | Status: DC
  Administered 2015-04-17: 5 [IU] via SUBCUTANEOUS
  Administered 2015-04-17: 3 [IU] via SUBCUTANEOUS
  Administered 2015-04-17: 10 [IU] via SUBCUTANEOUS
  Administered 2015-04-18: 9 [IU] via SUBCUTANEOUS
  Filled 2015-04-17 (×4): qty 1

## 2015-04-17 MED ORDER — INSULIN ASPART 100 UNIT/ML ~~LOC~~ SOLN
10.0000 [IU] | Freq: Once | SUBCUTANEOUS | Status: AC
  Administered 2015-04-17: 10 [IU] via SUBCUTANEOUS
  Filled 2015-04-17: qty 1

## 2015-04-17 MED ORDER — ACETAMINOPHEN 325 MG PO TABS
650.0000 mg | ORAL_TABLET | Freq: Once | ORAL | Status: AC
  Administered 2015-04-17: 650 mg via ORAL
  Filled 2015-04-17: qty 2

## 2015-04-17 MED ORDER — DIAZEPAM 5 MG PO TABS
5.0000 mg | ORAL_TABLET | Freq: Once | ORAL | Status: AC
  Administered 2015-04-17: 5 mg via ORAL
  Filled 2015-04-17: qty 1

## 2015-04-17 MED ORDER — INSULIN GLARGINE 100 UNIT/ML ~~LOC~~ SOLN
44.0000 [IU] | Freq: Every day | SUBCUTANEOUS | Status: DC
  Administered 2015-04-17 – 2015-04-19 (×2): 44 [IU] via SUBCUTANEOUS
  Filled 2015-04-17 (×5): qty 0.44

## 2015-04-17 NOTE — ED Notes (Signed)
Notified Dr Criss AlvineGoldston of CBG reading 419 and He gave verbal order to give pt 10unites of Novolog.

## 2015-04-17 NOTE — ED Notes (Signed)
Snack and drink given to patient. Regular diet order taken for dinner. 
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
## 2015-04-17 NOTE — ED Notes (Signed)
Staffing notified of need for sitter- no one avail at this time

## 2015-04-17 NOTE — ED Notes (Signed)
Pt called out and notified this RN "I don't like my sitter and I want a new one" The sitter notified this RN that the pt stated "I want my door closed:" and the sitter and this RN informed the pt that she could not have the door closed per facility policy. Pt requested a new sitter. Marketing executiveCharge RN and Staffing office notified that another sitter in the department would switch with the present sitter and gave the okay.

## 2015-04-17 NOTE — ED Notes (Signed)
Spoke with RN at [facility] about patient's CBG levels. He stated that it is protocol for an individual who had a CBG > 350 mg/dl be held at the hospital until they have had at least 24 hours of CBGs under 350 mg/dl. Will continue to assess CBGs and proceed from there.  

## 2015-04-17 NOTE — ED Notes (Signed)
Pt ambulated to the bathroom with the sitter 

## 2015-04-17 NOTE — ED Notes (Signed)
Pt advised she wanted to kill herself. She stated she was going to pull a fake gun out on police or jump infront the fastest moving truck she sees.  She repeated several times she wanted to die and there was nothing we could do about it.  She also stated she was going to tell the person who came over the tele-phyc machine she was fine so they would discharge her. Pt ask sitter to step out while she talked on tele-phyc.

## 2015-04-17 NOTE — ED Notes (Signed)
Attempt to initiate IV x 2 with no success; Oni MD aware

## 2015-04-17 NOTE — ED Notes (Signed)
CBG 227 

## 2015-04-17 NOTE — ED Notes (Signed)
Patient was given a cup of coffee. 

## 2015-04-17 NOTE — ED Notes (Signed)
Patient cbg was [id number], Nurse was informed.

## 2015-04-17 NOTE — BH Assessment (Signed)
[facility]   Clinician talked with Dr. Mora Bellmanni.  Let him know that [facility] a patient's CBG needs to be <350 for a 24 hour period which may put it at about 23:00 on 11/04.  [facility] that [facility].  Patient does meet inpatient criteria and if she refused to sign in voluntary she needs to be made IVC.  Dr. Mora Bellmanni agrees with this.

## 2015-04-17 NOTE — ED Notes (Signed)
3 cups of ice water provided

## 2015-04-17 NOTE — ED Notes (Signed)
While at bedside, [facility] plan of overdosing on her insulin, or jumping in front of a car, or buying a toy gun and trying to get police to shoot her. MD notified, sitter to bedside, pt will be changed into scrubs, belongings removed and wanded by security.

## 2015-04-17 NOTE — ED Notes (Signed)
Patient was given a snack and drink, Regular diet order taken. 

## 2015-04-18 LAB — CBG MONITORING, ED
Glucose-Capillary: 190 mg/dL — ABNORMAL HIGH (ref 65–99)
Glucose-Capillary: 357 mg/dL — ABNORMAL HIGH (ref 65–99)
Glucose-Capillary: 377 mg/dL — ABNORMAL HIGH (ref 65–99)
Glucose-Capillary: 123 mg/dL — ABNORMAL HIGH (ref 65–99)
Glucose-Capillary: 149 mg/dL — ABNORMAL HIGH (ref 65–99)

## 2015-04-18 MED ORDER — ACETAMINOPHEN 500 MG PO TABS
1000.0000 mg | ORAL_TABLET | Freq: Once | ORAL | Status: AC
  Administered 2015-04-18: 1000 mg via ORAL
  Filled 2015-04-18: qty 2

## 2015-04-18 MED ORDER — INSULIN ASPART 100 UNIT/ML ~~LOC~~ SOLN
4.0000 [IU] | Freq: Three times a day (TID) | SUBCUTANEOUS | Status: DC
  Administered 2015-04-18 – 2015-04-19 (×4): 4 [IU] via SUBCUTANEOUS
  Filled 2015-04-18 (×4): qty 1

## 2015-04-18 MED ORDER — INSULIN ASPART 100 UNIT/ML ~~LOC~~ SOLN
0.0000 [IU] | Freq: Three times a day (TID) | SUBCUTANEOUS | Status: DC
Start: 2015-04-18 — End: 2015-04-19
  Administered 2015-04-18: 2 [IU] via SUBCUTANEOUS
  Administered 2015-04-18: 15 [IU] via SUBCUTANEOUS
  Administered 2015-04-19: 5 [IU] via SUBCUTANEOUS
  Filled 2015-04-18 (×3): qty 1

## 2015-04-18 MED ORDER — IBUPROFEN 800 MG PO TABS
800.0000 mg | ORAL_TABLET | Freq: Once | ORAL | Status: AC
  Administered 2015-04-18: 800 mg via ORAL
  Filled 2015-04-18: qty 1

## 2015-04-18 NOTE — ED Notes (Signed)
Gave pt ice water, per Selena BattenKim - RN and Kriste BasqueBecky - RN

## 2015-04-18 NOTE — ED Notes (Signed)
Dr. Broadus JohnPfieffer at bedside to discuss pt's refusal of insulin.

## 2015-04-18 NOTE — ED Notes (Signed)
Pharmacy aware of need for Lantus. 

## 2015-04-18 NOTE — ED Notes (Signed)
[name] stretcher switched out for hospital bed for comfort. Pt sitting on bed watching tv and working find-a-word puzzles printed for pt.

## 2015-04-18 NOTE — ED Notes (Signed)
Atarax given as requested.

## 2015-04-18 NOTE — ED Notes (Signed)
Pt given coffee as requested. Pt requested snack from NT. Advised of next snack time - 2100 - as discussed previously.

## 2015-04-18 NOTE — ED Notes (Signed)
Sitter gave pt cup of coffee. Encouraged pt and sitter to maintain snack times please. Voiced understanding.

## 2015-04-18 NOTE — ED Notes (Signed)
Ativan given as requested. 

## 2015-04-18 NOTE — ED Notes (Signed)
Pt called out and asked for her Vistaril

## 2015-04-18 NOTE — ED Notes (Signed)
Atarax given as requested. 

## 2015-04-18 NOTE — ED Notes (Signed)
Pt continues to request more food and graham crackers after being given her 21:00 snack of coffee, graham crackers and peanut butter - pt [facility] of the [facility] rules and regultions for [facility] patients as well as her plan of care to maintain her blood sugar <350, pt agitated that she can not be given additional food at this time. Rechecked pt's CBG and resulted of 149, pt currently refusing to take her nighttime dose of lantus and states "it will bottom me out" - this RN educated pt that lantus is a long acting insulin that does not quickly drop blood sugar and that staff would monitor her blood sugar closely to prevent hypoglycemia. Pt continues to be agitated and states she knows she has rights to refuse medications. Dr. Broadus JohnPfieffer made aware, as well as Elliot GurneyWoody, Consulting civil engineerCharge RN. Pt also refusing her remeron.

## 2015-04-19 ENCOUNTER — Inpatient Hospital Stay (HOSPITAL_COMMUNITY)
Admission: AD | Admit: 2015-04-19 | Discharge: 2015-05-01 | DRG: 885 | Disposition: A | Discharge status: Home / Self Care | Payer: Federal, State, Local not specified - Other | Source: Intra-hospital | Attending: Psychiatry | Admitting: Psychiatry

## 2015-04-19 ENCOUNTER — Encounter (HOSPITAL_COMMUNITY): Payer: Self-pay | Admitting: *Deleted

## 2015-04-19 DIAGNOSIS — F332 Major depressive disorder, recurrent severe without psychotic features: Secondary | ICD-10-CM | POA: Diagnosis present

## 2015-04-19 DIAGNOSIS — E1142 Type 2 diabetes mellitus with diabetic polyneuropathy: Secondary | ICD-10-CM | POA: Diagnosis present

## 2015-04-19 DIAGNOSIS — I10 Essential (primary) hypertension: Secondary | ICD-10-CM | POA: Diagnosis present

## 2015-04-19 DIAGNOSIS — E119 Type 2 diabetes mellitus without complications: Secondary | ICD-10-CM

## 2015-04-19 DIAGNOSIS — E1165 Type 2 diabetes mellitus with hyperglycemia: Secondary | ICD-10-CM | POA: Diagnosis present

## 2015-04-19 DIAGNOSIS — E11649 Type 2 diabetes mellitus with hypoglycemia without coma: Secondary | ICD-10-CM | POA: Diagnosis present

## 2015-04-19 DIAGNOSIS — Z794 Long term (current) use of insulin: Secondary | ICD-10-CM | POA: Diagnosis not present

## 2015-04-19 DIAGNOSIS — E1140 Type 2 diabetes mellitus with diabetic neuropathy, unspecified: Secondary | ICD-10-CM

## 2015-04-19 DIAGNOSIS — F3164 Bipolar disorder, current episode mixed, severe, with psychotic features: Secondary | ICD-10-CM | POA: Diagnosis present

## 2015-04-19 DIAGNOSIS — Z79899 Other long term (current) drug therapy: Secondary | ICD-10-CM

## 2015-04-19 DIAGNOSIS — F4310 Post-traumatic stress disorder, unspecified: Secondary | ICD-10-CM | POA: Diagnosis present

## 2015-04-19 DIAGNOSIS — Z818 Family history of other mental and behavioral disorders: Secondary | ICD-10-CM | POA: Diagnosis not present

## 2015-04-19 DIAGNOSIS — F10230 Alcohol dependence with withdrawal, uncomplicated: Secondary | ICD-10-CM | POA: Diagnosis present

## 2015-04-19 DIAGNOSIS — Z634 Disappearance and death of family member: Secondary | ICD-10-CM

## 2015-04-19 DIAGNOSIS — F17210 Nicotine dependence, cigarettes, uncomplicated: Secondary | ICD-10-CM | POA: Diagnosis present

## 2015-04-19 DIAGNOSIS — E1149 Type 2 diabetes mellitus with other diabetic neurological complication: Secondary | ICD-10-CM | POA: Insufficient documentation

## 2015-04-19 DIAGNOSIS — F329 Major depressive disorder, single episode, unspecified: Secondary | ICD-10-CM | POA: Diagnosis present

## 2015-04-19 DIAGNOSIS — R45851 Suicidal ideations: Secondary | ICD-10-CM | POA: Diagnosis present

## 2015-04-19 DIAGNOSIS — IMO0002 Reserved for concepts with insufficient information to code with codable children: Secondary | ICD-10-CM | POA: Insufficient documentation

## 2015-04-19 DIAGNOSIS — F10239 Alcohol dependence with withdrawal, unspecified: Secondary | ICD-10-CM | POA: Diagnosis present

## 2015-04-19 DIAGNOSIS — IMO0001 Reserved for inherently not codable concepts without codable children: Secondary | ICD-10-CM

## 2015-04-19 DIAGNOSIS — F1994 Other psychoactive substance use, unspecified with psychoactive substance-induced mood disorder: Secondary | ICD-10-CM | POA: Diagnosis present

## 2015-04-19 DIAGNOSIS — F32A Depression, unspecified: Secondary | ICD-10-CM | POA: Diagnosis present

## 2015-04-19 DIAGNOSIS — Z789 Other specified health status: Secondary | ICD-10-CM | POA: Diagnosis not present

## 2015-04-19 LAB — CBG MONITORING, ED
Glucose-Capillary: 57 mg/dL — ABNORMAL LOW (ref 65–99)
Glucose-Capillary: 87 mg/dL (ref 65–99)
Glucose-Capillary: 100 mg/dL — ABNORMAL HIGH (ref 65–99)
Glucose-Capillary: 213 mg/dL — ABNORMAL HIGH (ref 65–99)

## 2015-04-19 LAB — GLUCOSE, CAPILLARY: Glucose-Capillary: 286 mg/dL — ABNORMAL HIGH (ref 65–99)

## 2015-04-19 MED ORDER — ONDANSETRON 4 MG PO TBDP: 4.0000 mg | ORAL_TABLET | Freq: Four times a day (QID) | ORAL | Status: AC | PRN

## 2015-04-19 MED ORDER — HYDROXYZINE HCL 50 MG PO TABS
50.0000 mg | ORAL_TABLET | Freq: Four times a day (QID) | ORAL | Status: DC | PRN
  Administered 2015-04-19: 50 mg via ORAL
  Filled 2015-04-19: qty 1

## 2015-04-19 MED ORDER — LISINOPRIL 5 MG PO TABS
5.0000 mg | ORAL_TABLET | Freq: Every day | ORAL | Status: DC
  Administered 2015-04-20 – 2015-05-01 (×12): 5 mg via ORAL
  Filled 2015-04-19 (×9): qty 1
  Filled 2015-04-19: qty 7
  Filled 2015-04-19 (×5): qty 1

## 2015-04-19 MED ORDER — INFLUENZA VAC SPLIT QUAD 0.5 ML IM SUSY
0.5000 mL | PREFILLED_SYRINGE | INTRAMUSCULAR | Status: DC
  Filled 2015-04-19: qty 0.5

## 2015-04-19 MED ORDER — INSULIN GLARGINE 100 UNIT/ML ~~LOC~~ SOLN
40.0000 [IU] | Freq: Every morning | SUBCUTANEOUS | Status: DC
  Administered 2015-04-20: 40 [IU] via SUBCUTANEOUS

## 2015-04-19 MED ORDER — LOPERAMIDE HCL 2 MG PO CAPS: 2.0000 mg | ORAL_CAPSULE | ORAL | Status: AC | PRN

## 2015-04-19 MED ORDER — MIRTAZAPINE 30 MG PO TABS
30.0000 mg | ORAL_TABLET | Freq: Every day | ORAL | Status: DC
  Filled 2015-04-19 (×12): qty 1

## 2015-04-19 MED ORDER — INSULIN ASPART 100 UNIT/ML ~~LOC~~ SOLN
0.0000 [IU] | Freq: Three times a day (TID) | SUBCUTANEOUS | Status: DC
  Administered 2015-04-19: 8 [IU] via SUBCUTANEOUS
  Administered 2015-04-20: 15 [IU] via SUBCUTANEOUS
  Administered 2015-04-20: 11 [IU] via SUBCUTANEOUS
  Administered 2015-04-20: 3 [IU] via SUBCUTANEOUS
  Administered 2015-04-21 (×3): 11 [IU] via SUBCUTANEOUS
  Administered 2015-04-22 – 2015-04-23 (×2): 15 [IU] via SUBCUTANEOUS
  Administered 2015-04-23: 3 [IU] via SUBCUTANEOUS
  Administered 2015-04-23: 15 [IU] via SUBCUTANEOUS
  Administered 2015-04-24: 5 [IU] via SUBCUTANEOUS
  Administered 2015-04-24: 8 [IU] via SUBCUTANEOUS
  Administered 2015-04-24 – 2015-04-25 (×2): 15 [IU] via SUBCUTANEOUS
  Administered 2015-04-26: 5 [IU] via SUBCUTANEOUS
  Administered 2015-04-26: 15 [IU] via SUBCUTANEOUS
  Administered 2015-04-27 – 2015-04-28 (×3): 8 [IU] via SUBCUTANEOUS
  Administered 2015-04-29: 11 [IU] via SUBCUTANEOUS
  Administered 2015-04-29: 2 [IU] via SUBCUTANEOUS

## 2015-04-19 MED ORDER — LORAZEPAM 1 MG PO TABS: 1.0000 mg | ORAL_TABLET | Freq: Every day | ORAL | Status: DC

## 2015-04-19 MED ORDER — HYDROXYZINE HCL 25 MG PO TABS
25.0000 mg | ORAL_TABLET | Freq: Four times a day (QID) | ORAL | Status: DC | PRN
  Administered 2015-04-20: 25 mg via ORAL
  Filled 2015-04-19: qty 1

## 2015-04-19 MED ORDER — METFORMIN HCL 500 MG PO TABS
1000.0000 mg | ORAL_TABLET | Freq: Two times a day (BID) | ORAL | Status: DC
  Filled 2015-04-19 (×5): qty 2

## 2015-04-19 MED ORDER — VITAMIN B-1 100 MG PO TABS
100.0000 mg | ORAL_TABLET | Freq: Every day | ORAL | Status: DC
  Administered 2015-04-20 – 2015-05-01 (×12): 100 mg via ORAL
  Filled 2015-04-19 (×15): qty 1

## 2015-04-19 MED ORDER — THIAMINE HCL 100 MG/ML IJ SOLN: 100.0000 mg | Freq: Once | INTRAMUSCULAR | Status: DC

## 2015-04-19 MED ORDER — MAGNESIUM HYDROXIDE 400 MG/5ML PO SUSP: 30.0000 mL | Freq: Every day | ORAL | Status: DC | PRN

## 2015-04-19 MED ORDER — LORAZEPAM 1 MG PO TABS: 1.0000 mg | ORAL_TABLET | Freq: Two times a day (BID) | ORAL | Status: DC

## 2015-04-19 MED ORDER — INSULIN LISPRO 100 UNIT/ML ~~LOC~~ SOLN: 3.0000 [IU] | Freq: Three times a day (TID) | SUBCUTANEOUS | Status: DC

## 2015-04-19 MED ORDER — LORAZEPAM 1 MG PO TABS: 1.0000 mg | ORAL_TABLET | Freq: Three times a day (TID) | ORAL | Status: DC

## 2015-04-19 MED ORDER — PNEUMOCOCCAL VAC POLYVALENT 25 MCG/0.5ML IJ INJ: 0.5000 mL | INJECTION | INTRAMUSCULAR | Status: DC

## 2015-04-19 MED ORDER — LORATADINE 10 MG PO TABS
10.0000 mg | ORAL_TABLET | Freq: Every morning | ORAL | Status: DC
  Administered 2015-04-20 – 2015-05-01 (×12): 10 mg via ORAL
  Filled 2015-04-19: qty 7
  Filled 2015-04-19 (×18): qty 1

## 2015-04-19 MED ORDER — LORAZEPAM 1 MG PO TABS
1.0000 mg | ORAL_TABLET | Freq: Four times a day (QID) | ORAL | Status: DC | PRN
  Administered 2015-04-20: 1 mg via ORAL
  Filled 2015-04-19: qty 1

## 2015-04-19 MED ORDER — FLUOXETINE HCL 20 MG PO CAPS
40.0000 mg | ORAL_CAPSULE | Freq: Every day | ORAL | Status: DC
  Administered 2015-04-20 – 2015-04-22 (×3): 40 mg via ORAL
  Filled 2015-04-19 (×6): qty 2

## 2015-04-19 MED ORDER — INSULIN ASPART 100 UNIT/ML ~~LOC~~ SOLN
0.0000 [IU] | Freq: Every day | SUBCUTANEOUS | Status: DC
  Administered 2015-04-19: 3 [IU] via SUBCUTANEOUS
  Administered 2015-04-28: 5 [IU] via SUBCUTANEOUS

## 2015-04-19 MED ORDER — LORAZEPAM 1 MG PO TABS
ORAL_TABLET | ORAL | Status: AC
Start: 2015-04-19 — End: 2015-04-19
  Filled 2015-04-19: qty 1

## 2015-04-19 MED ORDER — ACETAMINOPHEN 325 MG PO TABS
650.0000 mg | ORAL_TABLET | Freq: Four times a day (QID) | ORAL | Status: DC | PRN
  Administered 2015-04-21: 650 mg via ORAL
  Filled 2015-04-19: qty 2

## 2015-04-19 MED ORDER — ADULT MULTIVITAMIN W/MINERALS CH
1.0000 | ORAL_TABLET | Freq: Every day | ORAL | Status: DC
  Administered 2015-04-20 – 2015-05-01 (×12): 1 via ORAL
  Filled 2015-04-19 (×15): qty 1

## 2015-04-19 MED ORDER — LORAZEPAM 1 MG PO TABS
1.0000 mg | ORAL_TABLET | Freq: Four times a day (QID) | ORAL | Status: DC
  Administered 2015-04-19 – 2015-04-20 (×2): 1 mg via ORAL
  Filled 2015-04-19: qty 1

## 2015-04-19 MED ORDER — INSULIN GLARGINE 100 UNIT/ML ~~LOC~~ SOLN
44.0000 [IU] | Freq: Every day | SUBCUTANEOUS | Status: DC
  Administered 2015-04-19: 44 [IU] via SUBCUTANEOUS

## 2015-04-19 MED ORDER — ALUM & MAG HYDROXIDE-SIMETH 200-200-20 MG/5ML PO SUSP
30.0000 mL | ORAL | Status: DC | PRN
Start: 2015-04-19 — End: 2015-05-01

## 2015-04-19 MED ORDER — GABAPENTIN 400 MG PO CAPS
800.0000 mg | ORAL_CAPSULE | Freq: Once | ORAL | Status: AC
  Administered 2015-04-19: 800 mg via ORAL
  Filled 2015-04-19 (×2): qty 2

## 2015-04-19 NOTE — ED Notes (Signed)
Pt concerned re: being given Lantus and not another snack d/t CBG "dropped last night". Advised pt will recheck CBG in approx 30 minutes. Pt began speaking in loud voice that she wanted the snack now. Advised pt will give a snack if needed after rechecking CBG.

## 2015-04-19 NOTE — ED Notes (Signed)
Discussed again plan of care with patient - assured the patient that staff can recheck her blood sugar more frequently to prevent hypoglycemia. Pt states she will take her medications however continues to decline her remeron.

## 2015-04-19 NOTE — ED Notes (Signed)
Pharmacy aware of need for Lantus.

## 2015-04-19 NOTE — ED Notes (Signed)
Advised pt [facility] should be arriving shortly. Pt noted to be cursing - stating she wants her lunch now. Advised pt if it arrives before [place], she may eat.

## 2015-04-19 NOTE — Progress Notes (Signed)
Nursing Note : 39 y/o, b/f admitted to having thoughts of killing self by injecting self with insulin or getting a gun or shooting self. Pt has been increasing depressed since mother died 1 month ago causing her to drink more. Pt states she drinks approx. 2 -40 oz beers daily. Pt has HTN and was dx with IDDM in Sept. Her 38y/o son is currently living with his father due to pt unable to care for him at this time.Pt denies A/V  hall, pt contracts for safety. " I want to feel safe ."Oriented to the unit, Education provided about safety on the unit, including fall prevention. Nutrition offered, safety checks initiated every 15 minutes. Search completed.

## 2015-04-19 NOTE — ED Notes (Signed)
Dr Anitra LauthPlunkett [facility].

## 2015-04-19 NOTE — Tx Team (Addendum)
Initial Interdisciplinary Treatment Plan   PATIENT STRESSORS: Loss of Mother  Medical Problems  PATIENT STRENGTHS: Ability for insight General fund of knowledge   PROBLEM LIST: Problem List/Patient Goals Date to be addressed Date deferred Reason deferred Estimated date of resolution  " I want to be safe."  04/19/2015   04/25/2015  " I miss my mom" 04/19/2015   04/25/2015  Depression 04/19/15     Suicidal Ideation 04/19/15     Substance abuse (ETOH) 04/19/15                              DISCHARGE CRITERIA:  Ability to meet basic life and health needs Improved stabilization in mood, thinking, and/or behavior  PRELIMINARY DISCHARGE PLAN: Return to previous living arrangement  PATIENT/FAMIILY INVOLVEMENT: This treatment plan has been presented to and reviewed with the patient, Carolyn Ayala, and/or family member,.  The patient and family have been given the opportunity to ask questions and make suggestions.  Jimmey Ralpherez, Donna M 04/19/2015, 4:17 PM

## 2015-04-19 NOTE — ED Notes (Signed)
Atarax given as requested. Pt eating breakfast.

## 2015-04-19 NOTE — Progress Notes (Signed)
D.  Pt is a 39 year old AAF admitted to the services of Dr. Dub MikesLugo for treatment of depression, suicidal ideation, as well as alcohol abuse.  Pt did attend evening AA group tonight, and is interacting appropriately with peers on the unit.  She denies SI/HI/halluicnations at this time.  Pt is hopeful for medication adjustment and detox.  She is experiencing some s/s of withdrawal at the moment including anxiety, sweating, restlessness.  A.  Support and encouragement offered, NP notified of [name] withdrawal symptoms and new orders received.  R.  Pt remains safe on unit, will continue to monitor.

## 2015-04-19 NOTE — ED Notes (Signed)
Telepsych being performed. 

## 2015-04-19 NOTE — ED Notes (Signed)
Carolyn Ayala, [facility], aware of CBG 213 - not covering d/t pt refusing d/t has not received lunch tray.

## 2015-04-19 NOTE — ED Provider Notes (Signed)
I was advised that the patient had refused to take her Lantus. She stated she would not take her Lantus because her blood sugar normalized and she did take it by morning she would be hypoglycemic. I counseled the patient and explained to her that she would not be hypoglycemic by morning and encouraged her to take her Lantus. She had initially refused but subsequently with nursing staff determined she would take her medications.  Carolyn BarretteMarcy Matisyn Cabeza, MD 05/19/15 [phone]

## 2015-04-19 NOTE — ED Notes (Signed)
Pt now apologizing to sitter for her behavior earlier.

## 2015-04-19 NOTE — BH Assessment (Signed)
04/19/15 0914.  [facility] with Becky RN [facility] about reassessment of this pt and was informed that Vernona RiegerLaura, the [facility] PA, is reassessing at this time.  TTS reassessment not needed. Daleen SquibbGreg Piers Baade, LCSW

## 2015-04-19 NOTE — ED Notes (Signed)
Per Tanna SavoyEric, AC, [facility] - [facility] - Dr Dub [facility]. May arrive around 12. Pt aware.

## 2015-04-19 NOTE — ED Notes (Addendum)
When RN went in to introduce self to pt, she reported that she felt that her sugar level was low.  RN checked CBG and found it to be 57.  Pt was given 1 OJ and a pack of gram crackers.  Notified Dr. Mora Bellmanni.  Will check CBG in 20 minutes.

## 2015-04-19 NOTE — ED Notes (Signed)
RN found that pt was cooperative once she understood that the rules of the unit were going to be followed. While she did attempt to "bend the rules" she responded positively to redirection and re-enforcement of the rules.  It should be noted that the pt only slept about two hours during the night.

## 2015-04-19 NOTE — Progress Notes (Signed)
Patient did attend the evening speaker AA meeting.  

## 2015-04-19 NOTE — ED Notes (Signed)
Pt signed consent forms - [facility], copy sent to medical records and [facility] to [facility].

## 2015-04-19 NOTE — Consult Note (Signed)
Telepsych Consultation   Reason for Consult:  Suicidal Ideation Referring Physician[facility]  [facility] Patient Identification: Carolyn Ayala MRN:  [id number] Principal Diagnosis: Suicidal ideation Diagnosis:   Patient Active Problem List   Diagnosis Date Noted  . Suicidal ideation [R45.851] 04/19/2015  . Recent bereavement [Z78.9] 04/19/2015  . Generalized anxiety disorder [F41.1] 02/13/2013  . Chronic posttraumatic stress disorder [F43.12] 02/13/2013  . Alcoholism with alcohol dependence (HCC) [F10.20] 02/08/2013  . Substance induced mood disorder (HCC) [F19.94] 02/08/2013  . IDDM (insulin dependent diabetes mellitus) (HCC) [E11.9, Z79.4] 02/08/2013    Total Time spent with patient: 30 minutes  Subjective:   Carolyn Ayala is a 39 y.o. female patient admitted with thoughts of killing herself with multiple plans triggered by recent death of her mother  HPI:    Carolyn Ayala is an 39 y.o. Female who is having thoughts of killing herself since the death of her mother a month ago. Patient states today during assessment "I don't feel a reason to go on. My mother was my main support. My depression is a ten. I am still so suicidal. I was never like this before my mother passed. I thought she was going home but ended up dying in the hospital. I could do several things like get a BB gun from [employer] to make the cops think I had a gun, inject myself with a massive dose of insulin, and step in front of a large truck. I have been drinking more to deal with the loss but when I sober up it is still there." The patient also reported additional stressor of being diagnosed as Diabetic in September of this year. Her blood sugars have been elevated while in the ED. However, this morning she had a hypoglycemic incident and feels worse physically because of this. Patient is at high risk for self harm and will need inpatient admission.   HPI Elements:   Location:  depression,  suicidal ideation. Quality:  Has active SI with multiple plans that are lethal. Severity:  Severe. Timing:  Started one month ago. Duration:  Acute. Context:  recent diagnoses of diabetes, loss of mother.  Past Medical History:  Past Medical History  Diagnosis Date  . Mental disorder   . Depression   . Diabetes mellitus without complication (HCC)   . Hypertension     Past Surgical History  Procedure Laterality Date  . Cesarean section     Family History:  Family History  Problem Relation Age of Onset  . Depression Mother    Social History:  History  Alcohol Use  . Yes    Comment: daily     History  Drug Use No    Social History   Social History  . Marital Status: Single    Spouse Name: N/A  . Number of Children: N/A  . Years of Education: N/A   Social History Main Topics  . Smoking status: Never Smoker   . Smokeless tobacco: None  . Alcohol Use: Yes     Comment: daily  . Drug Use: No  . Sexual Activity: Not Asked   Other Topics Concern  . None   Social History Narrative   ** Merged History Encounter **       Additional Social History:    Pain Medications: None Prescriptions: Insulin Levamir 60 units 2/D, Novalog befoe each meal sliding scale, Lisinopril 10mg  once daily, Gabapention 3x/D prn Over the Counter: None History of alcohol / drug use?: Yes Withdrawal Symptoms:  Patient aware of relationship between substance abuse and physical/medical complications, Weakness, Tremors, Tingling, Sweats, Nausea / Vomiting Name of Substance 1: ETOH 1 - Age of First Use: 39 years of age 26 - Amount (size/oz): Five 40 oz "Olde English" malt liquor 1 - Frequency: Daily for the last month 1 - Duration: Monthly 1 - Last Use / Amount: 11/02                   Allergies:  No Known Allergies  Labs:  Results for orders placed or performed during the hospital encounter of 04/16/15 (from the past 48 hour(s))  CBG monitoring, ED     Status: Abnormal    Collection Time: 04/17/15 11:59 AM  Result Value Ref Range   Glucose-Capillary 248 (H) 65 - 99 mg/dL  CBG monitoring, ED     Status: Abnormal   Collection Time: 04/17/15  1:12 PM  Result Value Ref Range   Glucose-Capillary 245 (H) 65 - 99 mg/dL  CBG monitoring, ED     Status: Abnormal   Collection Time: 04/17/15  6:32 PM  Result Value Ref Range   Glucose-Capillary 536 (H) 65 - 99 mg/dL  CBG monitoring, ED     Status: Abnormal   Collection Time: 04/17/15  7:15 PM  Result Value Ref Range   Glucose-Capillary 505 (H) 65 - 99 mg/dL  CBG monitoring, ED     Status: Abnormal   Collection Time: 04/17/15  9:12 PM  Result Value Ref Range   Glucose-Capillary 419 (H) 65 - 99 mg/dL  CBG monitoring, ED     Status: Abnormal   Collection Time: 04/17/15 10:35 PM  Result Value Ref Range   Glucose-Capillary 439 (H) 65 - 99 mg/dL  CBG monitoring, ED     Status: Abnormal   Collection Time: 04/18/15  1:07 AM  Result Value Ref Range   Glucose-Capillary 190 (H) 65 - 99 mg/dL  CBG monitoring, ED     Status: Abnormal   Collection Time: 04/18/15  6:45 AM  Result Value Ref Range   Glucose-Capillary 357 (H) 65 - 99 mg/dL  CBG monitoring, ED     Status: Abnormal   Collection Time: 04/18/15 11:52 AM  Result Value Ref Range   Glucose-Capillary 377 (H) 65 - 99 mg/dL   Comment 1 Notify RN   CBG monitoring, ED     Status: Abnormal   Collection Time: 04/18/15  6:05 PM  Result Value Ref Range   Glucose-Capillary 123 (H) 65 - 99 mg/dL  CBG monitoring, ED     Status: Abnormal   Collection Time: 04/18/15 10:35 PM  Result Value Ref Range   Glucose-Capillary 149 (H) 65 - 99 mg/dL  CBG monitoring, ED     Status: Abnormal   Collection Time: 04/19/15  2:36 AM  Result Value Ref Range   Glucose-Capillary 57 (L) 65 - 99 mg/dL  CBG monitoring, ED     Status: None   Collection Time: 04/19/15  3:12 AM  Result Value Ref Range   Glucose-Capillary 87 65 - 99 mg/dL   Comment 1 Notify RN    Comment 2 Document in Chart    CBG monitoring, ED     Status: Abnormal   Collection Time: 04/19/15  7:10 AM  Result Value Ref Range   Glucose-Capillary 100 (H) 65 - 99 mg/dL    Vitals: Blood pressure 113/85, pulse 65, temperature 98.3 F (36.8 C), temperature source Oral, resp. rate 16, height 5\' 2"  (1.575 m), weight 68.04 kg (  150 lb), last menstrual period 03/25/2015, SpO2 100 %.  Risk to Self: Suicidal Ideation: Yes-Currently Present Suicidal Intent: Yes-Currently Present Is patient at risk for suicide?: Yes Suicidal Plan?: Yes-Currently Present Specify Current Suicidal Plan: Overdose on insulin; suicide by cop Access to Means: Yes Specify Access to Suicidal Means: Insulin What has been your use of drugs/alcohol within the last 12 months?: ETOH use daily How many times?: 0 Other Self Harm Risks: None Triggers for Past Attempts: None known Intentional Self Injurious Behavior: None Risk to Others: Homicidal Ideation: No Thoughts of Harm to Others: No Current Homicidal Intent: No Current Homicidal Plan: No Access to Homicidal Means: No Identified Victim: No one History of harm to others?: No Assessment of Violence: None Noted Violent Behavior Description: None noted Does patient have access to weapons?: No Criminal Charges Pending?: No Does patient have a court date: No Prior Inpatient Therapy: Prior Inpatient Therapy: Yes Prior Therapy Dates: 03/2013 Prior Therapy Facilty/Provider(s): [facility]? Reason for Treatment: detox Prior Outpatient Therapy: Prior Outpatient Therapy: No Prior Therapy Dates: None Prior Therapy Facilty/Provider(s): None Reason for Treatment: None Does patient have an ACCT team?: No Does patient have Intensive In-House Services?  : No Does patient have [facility] services? : No Does patient have P4CC services?: No  Current Facility-Administered Medications  Medication Dose Route Frequency Provider Last Rate Last Dose  . FLUoxetine (PROZAC) capsule 40 mg  40 mg Oral q morning - 10a  Azalia Bilis, MD   40 mg at 04/18/15 1026  . gabapentin (NEURONTIN) capsule 1,000 mg  1,000 mg Oral TID Azalia Bilis, MD   1,000 mg at 04/19/15 0015  . hydrOXYzine (ATARAX/VISTARIL) tablet 100 mg  100 mg Oral Q6H PRN Azalia Bilis, MD   100 mg at 04/19/15 0739  . insulin aspart (novoLOG) injection 0-15 Units  0-15 Units Subcutaneous TID WC Gwyneth Sprout, MD   2 Units at 04/18/15 1814  . insulin aspart (novoLOG) injection 4 Units  4 Units Subcutaneous TID WC Gwyneth Sprout, MD   4 Units at 04/19/15 0732  . insulin glargine (LANTUS) injection 40 Units  40 Units Subcutaneous q morning - 10a Azalia Bilis, MD   40 Units at 04/18/15 1025  . insulin glargine (LANTUS) injection 44 Units  44 Units Subcutaneous QHS Tomasita Crumble, MD   44 Units at 04/19/15 0014  . lisinopril (PRINIVIL,ZESTRIL) tablet 5 mg  5 mg Oral Daily Azalia Bilis, MD   5 mg at 04/18/15 1026  . LORazepam (ATIVAN) tablet 0.5 mg  0.5 mg Oral Q6H PRN Blane Ohara, MD   0.5 mg at 04/18/15 2007  . metFORMIN (GLUCOPHAGE) tablet 1,000 mg  1,000 mg Oral BID WC Azalia Bilis, MD   1,000 mg at 04/19/15 0732  . mirtazapine (REMERON) tablet 30 mg  30 mg Oral QHS Azalia Bilis, MD   Stopped at 04/17/15 2215  . sodium chloride 0.9 % bolus 2,000 mL  2,000 mL Intravenous Once Tomasita Crumble, MD   2,000 mL at 04/17/15 0145   Current Outpatient Prescriptions  Medication Sig Dispense Refill  . FLUoxetine (PROZAC) 40 MG capsule Take 1 capsule (40 mg total) by mouth every morning.    . gabapentin (NEURONTIN) 800 MG tablet Take 800 mg by mouth 4 (four) times daily.    . hydrOXYzine (ATARAX/VISTARIL) 50 MG tablet Take 100 mg by mouth every 6 (six) hours as needed for anxiety.    . insulin glargine (LANTUS) 100 UNIT/ML injection Inject 40-44 Units into the skin every morning. Take 40 units  in the morning and 44 units at bedtime    . insulin lispro (HUMALOG) 100 UNIT/ML injection Inject 3-15 Units into the skin 3 (three) times daily before meals. Blood sugar less  than 200  0 units 200-250 3-5 units Greater than 250 10 -15 units    . lisinopril (PRINIVIL,ZESTRIL) 5 MG tablet Take 1 tablet (5 mg total) by mouth daily.    Marland Kitchen loratadine (CLARITIN) 10 MG tablet Take 10 mg by mouth every morning.    . metFORMIN (GLUCOPHAGE) 500 MG tablet Take 2 tablets (1,000 mg total) by mouth 2 (two) times daily with a meal.    . mirtazapine (REMERON) 30 MG tablet Take 1 tablet (30 mg total) by mouth at bedtime. 30 tablet 0  . Multiple Vitamin (MULTIVITAMIN WITH MINERALS) TABS tablet Take 1 tablet by mouth every morning.    . traZODone (DESYREL) 50 MG tablet Take 25-50 mg by mouth daily as needed for sleep.    . traMADol (ULTRAM) 50 MG tablet Take 100 mg by mouth every 6 (six) hours as needed for pain.    Marland Kitchen triamcinolone cream (KENALOG) 0.1 % Apply 1 application topically 2 (two) times daily as needed (rash).      Musculoskeletal: Strength & Muscle Tone: within normal limits Gait & Station: normal Patient leans: N/A  Psychiatric Specialty Exam: Physical Exam  Review of Systems  Neurological: Positive for tingling (In extremeties likely from untreated Diabetes ).  Psychiatric/Behavioral: Positive for depression, suicidal ideas and substance abuse. Negative for hallucinations and memory loss. The patient is nervous/anxious and has insomnia.     Blood pressure 113/85, pulse 65, temperature 98.3 F (36.8 C), temperature source Oral, resp. rate 16, height  (1.575 m), weight 68.04 kg (150 lb), last menstrual period 03/25/2015, SpO2 100 %.Body mass index is 27.43 kg/(m^2).  General Appearance: Casual  Eye Contact::  Good  Speech:  Clear and Coherent  Volume:  Normal  Mood:  Dysphoric  Affect:  Tearful  Thought Process:  Goal Directed and Intact  Orientation:  Full (Time, Place, and Person)  Thought Content:  Symptoms, worries  Suicidal Thoughts:  Yes.  with intent/plan  Homicidal Thoughts:  No  Memory:  Immediate;   Good Recent;   Good Remote;   Good   Judgement:  Fair  Insight:  Present  Psychomotor Activity:  Normal  Concentration:  Good  Recall:  Good  Fund of Knowledge:Good  Language: Good  Akathisia:  No  Handed:  Right  AIMS (if indicated):     Assets:  Communication Skills Desire for Improvement Leisure Time Resilience  ADL's:  Intact  Cognition: WNL  Sleep:      Medical Decision Making: New problem, with additional work up planned, Review of Psycho-Social Stressors (1), Review or order clinical lab tests (1) and Review of Medication Regimen & Side Effects (2)  Plan:  Recommend psychiatric Inpatient admission when medically cleared. Supportive therapy provided about ongoing stressors. Discussed crisis plan, support from social network, calling 911, coming to the Emergency Department, and calling Suicide Hotline. Disposition: Recommend inpatient treatment for severe depression, grief symptoms, and active suicidal ideation  Karleen Seebeck, NP-C 04/19/2015 9:42 AM

## 2015-04-20 ENCOUNTER — Encounter (HOSPITAL_COMMUNITY): Payer: Self-pay | Admitting: Psychiatry

## 2015-04-20 DIAGNOSIS — F4310 Post-traumatic stress disorder, unspecified: Secondary | ICD-10-CM | POA: Diagnosis present

## 2015-04-20 DIAGNOSIS — F10230 Alcohol dependence with withdrawal, uncomplicated: Secondary | ICD-10-CM | POA: Diagnosis present

## 2015-04-20 DIAGNOSIS — F332 Major depressive disorder, recurrent severe without psychotic features: Secondary | ICD-10-CM

## 2015-04-20 LAB — GLUCOSE, CAPILLARY
Glucose-Capillary: 251 mg/dL — ABNORMAL HIGH (ref 65–99)
Glucose-Capillary: 156 mg/dL — ABNORMAL HIGH (ref 65–99)
Glucose-Capillary: 417 mg/dL — ABNORMAL HIGH (ref 65–99)
Glucose-Capillary: 302 mg/dL — ABNORMAL HIGH (ref 65–99)
Glucose-Capillary: 188 mg/dL — ABNORMAL HIGH (ref 65–99)

## 2015-04-20 MED ORDER — CHLORDIAZEPOXIDE HCL 25 MG PO CAPS
25.0000 mg | ORAL_CAPSULE | Freq: Three times a day (TID) | ORAL | Status: AC
  Administered 2015-04-21 (×3): 25 mg via ORAL
  Filled 2015-04-20 (×3): qty 1

## 2015-04-20 MED ORDER — CARISOPRODOL 350 MG PO TABS
350.0000 mg | ORAL_TABLET | Freq: Four times a day (QID) | ORAL | Status: DC | PRN
Start: 2015-04-20 — End: 2015-05-01
  Administered 2015-04-20 – 2015-05-01 (×27): 350 mg via ORAL
  Filled 2015-04-20 (×27): qty 1

## 2015-04-20 MED ORDER — CHLORDIAZEPOXIDE HCL 25 MG PO CAPS
25.0000 mg | ORAL_CAPSULE | Freq: Every day | ORAL | Status: AC
Start: 2015-04-23 — End: 2015-04-23
  Administered 2015-04-23: 25 mg via ORAL
  Filled 2015-04-20: qty 1

## 2015-04-20 MED ORDER — INSULIN GLARGINE 100 UNIT/ML ~~LOC~~ SOLN
22.0000 [IU] | Freq: Every day | SUBCUTANEOUS | Status: DC
  Administered 2015-04-20: 22 [IU] via SUBCUTANEOUS

## 2015-04-20 MED ORDER — CHLORDIAZEPOXIDE HCL 25 MG PO CAPS
25.0000 mg | ORAL_CAPSULE | Freq: Four times a day (QID) | ORAL | Status: AC | PRN
  Administered 2015-04-20 – 2015-04-21 (×2): 25 mg via ORAL
  Filled 2015-04-20 (×3): qty 1

## 2015-04-20 MED ORDER — HYDROXYZINE HCL 50 MG PO TABS
50.0000 mg | ORAL_TABLET | Freq: Four times a day (QID) | ORAL | Status: AC | PRN
  Administered 2015-04-20 – 2015-04-22 (×7): 50 mg via ORAL
  Filled 2015-04-20 (×7): qty 1

## 2015-04-20 MED ORDER — CHLORDIAZEPOXIDE HCL 25 MG PO CAPS
25.0000 mg | ORAL_CAPSULE | Freq: Four times a day (QID) | ORAL | Status: AC
  Administered 2015-04-20 (×3): 25 mg via ORAL
  Filled 2015-04-20 (×3): qty 1

## 2015-04-20 MED ORDER — CHLORDIAZEPOXIDE HCL 25 MG PO CAPS
25.0000 mg | ORAL_CAPSULE | ORAL | Status: AC
  Administered 2015-04-22 (×2): 25 mg via ORAL
  Filled 2015-04-20 (×2): qty 1

## 2015-04-20 MED ORDER — GLUCERNA SHAKE PO LIQD
237.0000 mL | Freq: Two times a day (BID) | ORAL | Status: DC
  Administered 2015-04-20 – 2015-05-01 (×20): 237 mL via ORAL

## 2015-04-20 MED ORDER — METFORMIN HCL 500 MG PO TABS
1000.0000 mg | ORAL_TABLET | Freq: Two times a day (BID) | ORAL | Status: DC
  Administered 2015-04-20 – 2015-05-01 (×13): 1000 mg via ORAL
  Filled 2015-04-20 (×6): qty 2
  Filled 2015-04-20: qty 28
  Filled 2015-04-20 (×10): qty 2
  Filled 2015-04-20: qty 28
  Filled 2015-04-20 (×8): qty 2

## 2015-04-20 MED ORDER — GABAPENTIN 300 MG PO CAPS
1000.0000 mg | ORAL_CAPSULE | Freq: Three times a day (TID) | ORAL | Status: DC
  Administered 2015-04-20 – 2015-05-01 (×35): 1000 mg via ORAL
  Filled 2015-04-20 (×9): qty 2
  Filled 2015-04-20 (×2): qty 1
  Filled 2015-04-20 (×3): qty 2
  Filled 2015-04-20 (×2): qty 1
  Filled 2015-04-20 (×15): qty 2
  Filled 2015-04-20: qty 1
  Filled 2015-04-20 (×2): qty 2
  Filled 2015-04-20: qty 1
  Filled 2015-04-20 (×6): qty 2
  Filled 2015-04-20: qty 1
  Filled 2015-04-20 (×14): qty 2

## 2015-04-20 NOTE — [facility] Suicide Risk Assessment (Signed)
[facility] INPATIENT:  Family/Significant Other Suicide Prevention Education  Suicide Prevention Education:  Patient Refusal for Family/Significant Other Suicide Prevention Education: The patient Carolyn Ayala AntiguaDenise Robitaille has refused to provide written consent for family/significant other to be provided Family/Significant Other Suicide Prevention Education during admission and/or prior to discharge.  Physician notified.  SPE completed with pt, as pt refused to consent to family contact. SPI pamphlet provided to pt and pt was encouraged to share information with support network, ask questions, and talk about any concerns relating to SPE. Pt denies access to guns/firearms and verbalized understanding of information provided. Mobile Crisis information also provided to pt.   Smart, Tyger Oka LCSWA  04/20/2015, 12:52 PM

## 2015-04-20 NOTE — Clinical Social Work Note (Signed)
[facility] referral sent per pt request.  Trula SladeHeather Smart, MSW, LCSW Clinical Social Worker 04/20/2015 12:50 PM
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
## 2015-04-20 NOTE — Progress Notes (Signed)
Patient ID: Carolyn Ayala, female   DOB: 06-Feb-1976, 39 y.o.   MRN: [id number] D: Patient presents with flat affect, depressed mood.  She rates her depression, hopelessness and anxiety as a 10.  She c/o pain down her calves and her head.  She reports withdrawal symptoms as tremors, cravings, runny nose, nausea and irritability.  She has been placed on the librium protocol.  Her goal today is "to try and get on the right medication; my neurontin and vistaril.  She also has a diabetic consult.  Her CBG at 1200 was 417.  Patient is requesting glucerna.  She reports poor sleep and low energy.  She endorses passive SI with no specific plan.  Patient is attending groups and participating. A: Continue to monitor medication management and MD orders.  Safety checks completed every 15 minutes per protocol.  Offer support and encouragement as needed. R: Patient's behavior is appropriate to situation.

## 2015-04-20 NOTE — [facility] Group Notes (Signed)
[facility]   04/20/2015 9:37 AM  Participation Quality:  Appropriate   Mood/Affect:  Appropriate  Depression Rating:  8  Anxiety Rating:  8  Thoughts of Suicide:  No Will you contract for safety?   NA  Current AVH:  No  Plan for Discharge/Comments:  Pt reports that she has no current providers and wants help "with depression and stuff." Pt reports poor sleep and no current court dates.   Transportation Means: unknown at this time.   Supports: none   Smart, Conservation officer, natureHeather LCSW

## 2015-04-20 NOTE — Plan of Care (Signed)
Problem: Diagnosis: Increased Risk For Suicide Attempt Goal: STG-Patient Will Attend All Groups On The Unit Outcome: Progressing Pt did attend evening AA group     

## 2015-04-20 NOTE — [facility] Group Notes (Signed)
[facility] LCSW Group Therapy  04/20/2015 1:09 PM  Type of Therapy:  Group Therapy  Participation Level:  Active  Participation Quality:  Attentive  Affect:  Appropriate  Cognitive:  Alert and Oriented  Insight:  Engaged  Engagement in Therapy:  Engaged  Modes of Intervention:  Confrontation, Discussion, Education, Exploration, Problem-solving, Rapport Building, Socialization and Support  Summary of Progress/Problems: Today's Topic: Overcoming Obstacles. Patients identified one short term goal and potential obstacles in reaching this goal. Patients processed barriers involved in overcoming these obstacles. Patients identified steps necessary for overcoming these obstacles and explored motivation (internal and external) for facing these difficulties head on. Carolyn Ayala was attentive and engaged during today's processing group. She shared that she is hoping to get into [facility] and get help with both substance abuse and depression. She shared that a major trigger/obstacle for her was her mother's recent death. Pt tearful when talking about this life event and was receptive to being provided with Hospice Grief Counseling information. Pt hopeful about phone screening with [facility] Tuesday at 9am and continues to show progress in the group setting and improving insight.   Smart, Brice Potteiger LCSW 04/20/2015, 1:09 PM

## 2015-04-20 NOTE — Progress Notes (Signed)
NUTRITION NOTE  Consult from MD stating that pt is diabetic and would be appreciative of Glucerna order. Will order Glucerna Shake BID. Each supplement provides 220 kcal and 10 grams of protein.    Trenton GammonJessica Ameliana Brashear, RD, LDN Inpatient Clinical Dietitian Pager # [phone] After hours/weekend pager # [phone]

## 2015-04-20 NOTE — Progress Notes (Signed)
Pt was encouraged to attend evening AA group, but chose not to attend.

## 2015-04-20 NOTE — Progress Notes (Signed)
Recreation Therapy Notes  Date: 11.07.2016 Time: 9:30am Location: [address] Dayroom  Group Topic: Stress Management  Goal Area(s) Addresses:  Patient will actively participate in stress management techniques presented during session.   Behavioral Response: Did not attend.   Karinda Cabriales L Geoge Lawrance, LRT/CTRS        Cheyenne Schumm L 04/20/2015 10:21 AM 

## 2015-04-20 NOTE — Tx Team (Addendum)
Interdisciplinary Treatment Plan Update (Adult)  Date:  04/20/2015  Time Reviewed:  8:36 AM   Progress in Treatment: Attending groups: Yes. Participating in groups:  Yes. Taking medication as prescribed:  Yes. Tolerating medication:  Yes. Family/Significant othe contact made:  SPE completed with pt, She refused to consent to family contact.  Patient understands diagnosis:  Yes. and As evidenced by:  seeking treatment for alcohol abuse, depression, medication stabilization, and for passive SI upon admisson.  Discussing patient identified problems/goals with staff:  Yes. Medical problems stabilized or resolved:  Yes. Denies suicidal/homicidal ideation: Yes. Issues/concerns per patient self-inventory:  Other:  Discharge Plan or Barriers: Pt interested in referral to [facility]-she is homeless in [place] currently. Pt also provided with hospice grief counseling info through [facility] (death of mother last month). Pt signed release for ADS as well for o/p services.   Reason for Continuation of Hospitalization: Depression Medication stabilization Withdrawal symptoms  Comments:  Patient said that she is having thoughts of killing herself. Patient cites the death of her mother a month ago as the reason she is so depressed and wanting to kill herself. Patient lives by herself and is unclear about whether she is able to go back to her residence. She says "I haven't thought of that because I am going to kill myself." Patient has diabetes and is insulin dependent. She asked this clinician how much insulin she would need to overdose on to die in her sleep. Patient also wants to try to get hit by a car or get a toy gun and point it at a cop to commit "suicide by cop." Patient does not want to go in voluntarily to a psychiatry hospital Patient reports that she drinks five 40oz malt liquors daily for the last month. She drank before her mother died but this has increased since mother died.  Patient last drank on 04-27-2023 and reports some tingling on skin and some tremors in hands. Patient denies other drug use.Patient is a risk to try to harm herself if discharged. She needs inpatient care. Patient did go to [facility] in August of 2014. Has no current outpatient care.Diagnosis: Axis 1: Substance induced mood d/o; ETOH use d/o severe  Estimated length of stay:  3-5 days   New goal(s): to develop effective aftercare plan.   Additional Comments:  Patient and CSW reviewed pt's identified goals and treatment plan. Patient verbalized understanding and agreed to treatment plan. CSW reviewed [facility] "Discharge Process and Patient Involvement" Form. Pt verbalized understanding of information provided and signed form.    Review of initial/current patient goals per problem list:  1. Goal(s): Patient will participate in aftercare plan  Met: No.   Target date: at discharge  As evidenced by: Patient will participate within aftercare plan AEB aftercare provider and housing plan at discharge being identified.  11/7: CSW assessing for appropriate referrals. [facility] referral sent. ADS for o/p med manamgent and counseling.   2. Goal (s): Patient will exhibit decreased depressive symptoms and suicidal ideations.  Met: No.    Target date: at discharge  As evidenced by: Patient will utilize self rating of depression at 3 or below and demonstrate decreased signs of depression or be deemed stable for discharge by MD.  11/7: Pt rates depression as 8/10 and presents with depression/flat affect. Denies SI/HI/AVH.    3. Goal(s): Patient will demonstrate decreased signs of withdrawal due to substance abuse  Met:No.   Target date:at discharge   As evidenced by: Patient will produce a  CIWA/COWS score of 0, have stable vitals signs, and no symptoms of withdrawal.  11/7: Pt reports mild withdrawals with CIWA score of 5 and high pulse.    Attendees: Patient:   04/20/2015 8:36 AM   Family:    04/20/2015 8:36 AM   Physician:  Dr. Carlton Adam, MD 04/20/2015 8:36 AM   Nursing:   Clarise Cruz RN  04/20/2015 8:36 AM   Clinical Social Worker: Maxie Better, [place]  04/20/2015 8:36 AM   Clinical Social Worker: Erasmo Downer Drinkard LCSWA; Peri Maris LCSWA 04/20/2015 8:36 AM   Other:  04/20/2015 8:36 AM   Other:  Lucinda Dell; [facility]  04/20/2015 8:36 AM   Other:   04/20/2015 8:36 AM   Other:  04/20/2015 8:36 AM   Other:  04/20/2015 8:36 AM   Other:  04/20/2015 8:36 AM    04/20/2015 8:36 AM    04/20/2015 8:36 AM    04/20/2015 8:36 AM    04/20/2015 8:36 AM    Scribe for Treatment Team:   Maxie Better, LCSW 04/20/2015 8:36 AM
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
## 2015-04-20 NOTE — Progress Notes (Signed)
Pt attended spiritual care group on grief and loss facilitated by chaplain Burnis KingfisherMatthew Stalnaker and counseling intern [facility] Temperence Zenor. Group opened with brief discussion and psycho-social ed around grief and loss in relationships and in relation to self - identifying life patterns, circumstances, changes that cause losses. Established group norm of speaking from own life experience. Group goal of establishing open and affirming space for members to share loss and experience with grief, normalize grief experience and provide psycho social education and grief support.  Carolyn Ayala was present during group but did not participate verbally. She appeared to be actively engaged by listening to other group members. She stayed throughout the entirety of the group and appeared alert and oriented.  Carolyn HusbandsKathryn Petar Ayala Counseling Intern

## 2015-04-20 NOTE — Progress Notes (Signed)
Patient's CBG 471.  Notified Dr. Dub MikesLugo and Claudette Headonrad Withrow, NP.  Administered 15 units novalog per Renata Capriceonrad.  Will continue to monitor.

## 2015-04-20 NOTE — Progress Notes (Signed)
NUTRITION ASSESSMENT  Pt identified as at risk on the Malnutrition Screen Tool  INTERVENTION: 1. Educated patient on the importance of nutrition and encouraged intake of food and beverages. 2. Discussed weight goals. 3. Supplements: none  NUTRITION DIAGNOSIS: None at this time.  Goal: Pt to meet >/= 90% of their estimated nutrition needs.  Monitor:  PO intake  Assessment:  Pt seen for MST. She was admitted for depression, SI, and alcohol abuse (2-40 oz beers/day). Pt unsure of UBW PTA. Question accuracy of weight hx listed below; shows pt weighed 150 lbs on 04/16/15 and gained 21 lbs to weigh 171 lbs on 04/19/15.  Supplements not needed at this time but please order as appropriate should they be needed later in admission.  39 y.o. female  Height: Ht Readings from Last 1 Encounters:  04/19/15 5' 2.01" (1.575 m)    Weight: Wt Readings from Last 1 Encounters:  04/19/15 171 lb (77.565 kg)    Weight Hx: Wt Readings from Last 10 Encounters:  04/19/15 171 lb (77.565 kg)  04/16/15 150 lb (68.04 kg)  03/16/13 140 lb (63.504 kg)  02/17/13 150 lb (68.04 kg)  02/08/13 157 lb (71.215 kg)    BMI:  Body mass index is 31.27 kg/(m^2). Pt meets criteria for obesity based on current BMI.  Estimated Nutritional Needs: Kcal: 25-30 kcal/kg Protein: > 1 gram protein/kg Fluid: 1 ml/kcal  Diet Order: Diet Carb Modified Fluid consistency:: Thin; Room service appropriate?: Yes Pt is also offered choice of unit snacks mid-morning and mid-afternoon.  Pt is eating as desired.   Lab results and medications reviewed.       Trenton GammonJessica Jamee Pacholski, RD, LDN Inpatient Clinical Dietitian Pager # [phone] After hours/weekend pager # [phone]

## 2015-04-21 DIAGNOSIS — F329 Major depressive disorder, single episode, unspecified: Secondary | ICD-10-CM

## 2015-04-21 DIAGNOSIS — E1165 Type 2 diabetes mellitus with hyperglycemia: Secondary | ICD-10-CM

## 2015-04-21 DIAGNOSIS — Z794 Long term (current) use of insulin: Secondary | ICD-10-CM

## 2015-04-21 DIAGNOSIS — E1140 Type 2 diabetes mellitus with diabetic neuropathy, unspecified: Secondary | ICD-10-CM

## 2015-04-21 DIAGNOSIS — IMO0002 Reserved for concepts with insufficient information to code with codable children: Secondary | ICD-10-CM

## 2015-04-21 LAB — GLUCOSE, CAPILLARY
Glucose-Capillary: 316 mg/dL — ABNORMAL HIGH (ref 65–99)
Glucose-Capillary: 316 mg/dL — ABNORMAL HIGH (ref 65–99)
Glucose-Capillary: 306 mg/dL — ABNORMAL HIGH (ref 65–99)
Glucose-Capillary: 114 mg/dL — ABNORMAL HIGH (ref 65–99)

## 2015-04-21 LAB — HEMOGLOBIN A1C
Hgb A1c MFr Bld: 11.5 % — ABNORMAL HIGH (ref 4.8–5.6)
Mean Plasma Glucose: 283 mg/dL

## 2015-04-21 MED ORDER — INSULIN ASPART PROT & ASPART (70-30 MIX) 100 UNIT/ML ~~LOC~~ SUSP
40.0000 [IU] | Freq: Two times a day (BID) | SUBCUTANEOUS | Status: DC
  Administered 2015-04-21 – 2015-04-22 (×2): 40 [IU] via SUBCUTANEOUS

## 2015-04-21 MED ORDER — INSULIN ASPART 100 UNIT/ML ~~LOC~~ SOLN: 7.0000 [IU] | Freq: Three times a day (TID) | SUBCUTANEOUS | Status: DC

## 2015-04-21 MED ORDER — TRAMADOL HCL 50 MG PO TABS
50.0000 mg | ORAL_TABLET | Freq: Four times a day (QID) | ORAL | Status: DC | PRN
  Administered 2015-04-21 – 2015-04-28 (×19): 50 mg via ORAL
  Filled 2015-04-21 (×20): qty 1

## 2015-04-21 NOTE — [facility] Group Notes (Signed)
[facility] LCSW Group Therapy  04/21/2015 1:07 PM  Type of Therapy:  Group Therapy  Participation Level:  Minimal  Participation Quality:  Drowsy  Affect:  Blunted  Cognitive:  Lacking  Insight:  Lacking  Engagement in Therapy:  Limited  Modes of Intervention:  Confrontation, Discussion, Education, Problem-solving, Rapport Building, Socialization and Support  Summary of Progress/Problems:  MHA Speaker absent today. CSW provided pts with Mental Health Association pamphlet and discussed services provided. The pt processed ways by which to relate to the speaker. MHA speaker provided handouts and educational information pertaining to groups and services offered by the [facility]. Carolyn Ayala was attentive during group and receptive to information provided but did not actively participate in group discussion. She was resistant to talking in the group setting.   Carolyn Ayala, Mohamud Mrozek LCSW 04/21/2015, 1:07 PM

## 2015-04-21 NOTE — Progress Notes (Signed)
Recreation Therapy Notes  Animal-Assisted Activity (AAA) Program Checklist/Progress Notes Patient Eligibility Criteria Checklist & Daily Group note for Rec Tx Intervention  Date: 11.08.2016 Time: 2:45pm [facility]    AAA/T Program Assumption of Risk Form signed by Patient/ or Parent Legal Guardian yes  Patient is free of allergies or sever asthma yes  Patient reports no fear of animals yes  Patient reports no history of cruelty to animals yes  Patient understands his/her participation is voluntary yes  Behavioral Response: Did not attend.    Marykay Lexenise L Whitni Pasquini, LRT/CTRS       Verdie Barrows L 04/21/2015 3:10 PM

## 2015-04-21 NOTE — Progress Notes (Signed)
Spoke with Dr. Elisabeth Pigeonevine about patient's insulin needs.  Relayed my concern that Lantus 22 units QHS will likely not be enough basal insulin for this patient.  Dr. Elisabeth Pigeonevine communicated to me that she would like to switch patient to 70/30 insulin bidwc, Continue Novolog Moderate SSI, and Continue Metformin.  Dr. Elisabeth Pigeonevine gave me telephone orders to discontinue Lantus 22 units QHS and to also discontinue Novolog 7 units tidwc (Meal Coverage).  Orders also given to me to start 70/30 insulin 40 units bidwc (start tonight with supper).  New orders placed and [facility].  70/30 insulin bid with meals will be an easier and more affordable insulin regimen for this patient at time of d/c.    --Will follow patient during hospitalization--  Ambrose FinlandJeannine Johnston Nivea Wojdyla RN, MSN, CDE Diabetes Coordinator Inpatient Glycemic Control Team Team Pager: [phone] (8a-5p)

## 2015-04-21 NOTE — Progress Notes (Signed)
D: Pt is alert and oriented x4. Pt with flat affects complained of severe depression. She states, "I still can get over my mom's death; it is hard." Pt also complained of moderate stomach craping of 5 on a 0-10 pain scale. Pt however denies anxiety, SI, HI and AVH. Pt was calm and cooperative through the assessment.  A: Pt was encouraged to attend group. Medications offered as prescribed.  Support, encouragement, and safe environment provided.  15-minute safety checks continue.  R: Pt was med compliant.  Pt did not attend group. Safety checks continue

## 2015-04-21 NOTE — Plan of Care (Signed)
Problem: Consults Goal: Depression Patient Education See Patient Education Module for education specifics.  Outcome: Progressing Nurse discussed depression/coping skills with patient.        

## 2015-04-21 NOTE — Progress Notes (Signed)
[facility] MD Progress Note  04/21/2015 6:02 PM Carolyn Ayala  MRN:  [id number] Subjective:  Carolyn Ayala continues to have a hard time. She is complaining of tooth pain that affected her sleep last night. She is still wanting to go to a residential treatment program but is concerned about his blood sugar as they will not accept her until it is under control. States that she is upset about her diabetes as she was just diagnosed and she does not want to have the complications of Diabetes.  Principal Problem: MDD (major depressive disorder), recurrent severe, without psychosis (HCC) Diagnosis:   Patient Active Problem List   Diagnosis Date Noted  . Uncontrolled diabetes mellitus with diabetic neuropathy, with long-term current use of insulin (HCC) [E11.40, Z79.4, E11.65] 04/21/2015  . PTSD (post-traumatic stress disorder) [F43.10] 04/20/2015  . Alcohol dependence with withdrawal, uncomplicated (HCC) [F10.230] 04/20/2015  . Suicidal ideation [R45.851] 04/19/2015  . Recent bereavement [Z78.9] 04/19/2015  . MDD (major depressive disorder), recurrent severe, without psychosis (HCC) [F33.2] 04/19/2015  . Generalized anxiety disorder [F41.1] 02/13/2013  . Chronic posttraumatic stress disorder [F43.12] 02/13/2013  . Alcoholism with alcohol dependence (HCC) [F10.20] 02/08/2013  . Substance induced mood disorder (HCC) [F19.94] 02/08/2013  . IDDM (insulin dependent diabetes mellitus) (HCC) [E11.9, Z79.4] 02/08/2013   Total Time spent with patient: 30 minutes  Past Psychiatric History: see admission H and P  Past Medical History:  Past Medical History  Diagnosis Date  . Mental disorder   . Depression   . Diabetes mellitus without complication (HCC)   . Hypertension     Past Surgical History  Procedure Laterality Date  . Cesarean section     Family History:  Family History  Problem Relation Age of Onset  . Depression Mother    Family Psychiatric  History: see admission H and P Social  History:  History  Alcohol Use  . Yes    Comment: daily     History  Drug Use No    Social History   Social History  . Marital Status: Single    Spouse Name: N/A  . Number of Children: N/A  . Years of Education: N/A   Social History Main Topics  . Smoking status: Current Every Day Smoker -- 1.00 packs/day    Types: Cigarettes  . Smokeless tobacco: Current User  . Alcohol Use: Yes     Comment: daily  . Drug Use: No  . Sexual Activity: No   Other Topics Concern  . None   Social History Narrative   ** Merged History Encounter **       Additional Social History:    Pain Medications: flexerilfor leg neuropathy 2 days ago Prescriptions: Insulin Levamir 60 units 2/D, Novalog befoe each meal sliding scale, Lisinopril  once daily, Gabapention 3x/D prn History of alcohol / drug use?: Yes Name of Substance 1: ETOH 1 - Age of First Use: 39 years of age 77 - Amount (size/oz): 2 40 oz beers daily 1 - Frequency: daily x 1 month 1 - Duration: since mother died a month ago 1 - Last Use / Amount: 2 days ago                  Sleep: Poor  Appetite:  Fair  Current Medications: Current Facility-Administered Medications  Medication Dose Route Frequency Provider Last Rate Last Dose  . acetaminophen (TYLENOL) tablet 650 mg  650 mg Oral Q6H PRN Oneta Rack, NP   650 mg at 04/21/15 0804  .  alum & mag hydroxide-simeth (MAALOX/MYLANTA) 200-200-20 MG/5ML suspension 30 mL  30 mL Oral Q4H PRN Oneta Rack, NP      . carisoprodol (SOMA) tablet 350 mg  350 mg Oral QID PRN Rachael Fee, MD   350 mg at 04/21/15 2841  . chlordiazePOXIDE (LIBRIUM) capsule 25 mg  25 mg Oral Q6H PRN Rachael Fee, MD   25 mg at 04/20/15 1512  . [START ON 04/22/2015] chlordiazePOXIDE (LIBRIUM) capsule 25 mg  25 mg Oral BH-qamhs Rachael Fee, MD       Followed by  . [START ON 04/23/2015] chlordiazePOXIDE (LIBRIUM) capsule 25 mg  25 mg Oral Daily Rachael Fee, MD      . feeding supplement (GLUCERNA  SHAKE) (GLUCERNA SHAKE) liquid 237 mL  237 mL Oral BID BM Anderson Malta [place], RD   237 mL at 04/21/15 1419  . FLUoxetine (PROZAC) capsule 40 mg  40 mg Oral Daily Oneta Rack, NP   40 mg at 04/21/15 0756  . gabapentin (NEURONTIN) capsule 1,000 mg  1,000 mg Oral TID Rachael Fee, MD   1,000 mg at 04/21/15 1705  . hydrOXYzine (ATARAX/VISTARIL) tablet 50 mg  50 mg Oral Q6H PRN Rachael Fee, MD   50 mg at 04/21/15 1300  . Influenza vac split quadrivalent PF (FLUARIX) injection 0.5 mL  0.5 mL Intramuscular Tomorrow-1000 Beau Fanny, FNP   0.5 mL at 04/20/15 1346  . insulin aspart (novoLOG) injection 0-15 Units  0-15 Units Subcutaneous TID WC Oneta Rack, NP   11 Units at 04/21/15 1713  . insulin aspart (novoLOG) injection 0-5 Units  0-5 Units Subcutaneous QHS Oneta Rack, NP   3 Units at 04/19/15 2143  . insulin aspart protamine- aspart (NOVOLOG MIX 70/30) injection 40 Units  40 Units Subcutaneous BID WC Alison Murray, MD   40 Units at 04/21/15 1719  . lisinopril (PRINIVIL,ZESTRIL) tablet 5 mg  5 mg Oral Daily Oneta Rack, NP   5 mg at 04/21/15 0757  . loperamide (IMODIUM) capsule 2-4 mg  2-4 mg Oral PRN Worthy Flank, NP      . loratadine (CLARITIN) tablet 10 mg  10 mg Oral q morning - 10a Oneta Rack, NP   10 mg at 04/21/15 1034  . magnesium hydroxide (MILK OF MAGNESIA) suspension 30 mL  30 mL Oral Daily PRN Oneta Rack, NP      . metFORMIN (GLUCOPHAGE) tablet 1,000 mg  1,000 mg Oral BID WC Rachael Fee, MD   1,000 mg at 04/21/15 1708  . mirtazapine (REMERON) tablet 30 mg  30 mg Oral QHS Oneta Rack, NP   30 mg at 04/19/15 2200  . multivitamin with minerals tablet 1 tablet  1 tablet Oral Daily Worthy Flank, NP   1 tablet at 04/21/15 0757  . ondansetron (ZOFRAN-ODT) disintegrating tablet 4 mg  4 mg Oral Q6H PRN Worthy Flank, NP      . pneumococcal 23 valent vaccine (PNU-IMMUNE) injection 0.5 mL  0.5 mL Intramuscular Tomorrow-1000 Beau Fanny, FNP   0.5 mL at 04/20/15  1346  . thiamine (B-1) injection 100 mg  100 mg Intramuscular Once Worthy Flank, NP   100 mg at 04/19/15 2145  . thiamine (VITAMIN B-1) tablet 100 mg  100 mg Oral Daily Worthy Flank, NP   100 mg at 04/21/15 0757  . traMADol (ULTRAM) tablet 50 mg  50 mg Oral Q6H PRN Madie Reno  A Tally Mckinnon, MD   50 mg at 04/21/15 1255    Lab Results:  Results for orders placed or performed during the hospital encountJorja Loaer of 04/19/15 (from the past 48 hour(s))  Hemoglobin A1c     Status: Abnormal   Collection Time: 04/19/15  6:14 PM  Result Value Ref Range   Hgb A1c MFr Bld 11.5 (H) 4.8 - 5.6 %    Comment: (NOTE)         Pre-diabetes: 5.7 - 6.4         Diabetes: >6.4         Glycemic control for adults with diabetes: <7.0    Mean Plasma Glucose 283 mg/dL    Comment: (NOTE) Performed At: [facility] [address] [place], [place] [id number] Mila HomerHancock William F MD JX:[id number]:[phone] Performed at [facility]   Glucose, capillary     Status: Abnormal   Collection Time: 04/19/15 10:28 PM  Result Value Ref Range   Glucose-Capillary 251 (H) 65 - 99 mg/dL   Comment 1 Notify RN   Glucose, capillary     Status: Abnormal   Collection Time: 04/20/15  6:14 AM  Result Value Ref Range   Glucose-Capillary 156 (H) 65 - 99 mg/dL  Glucose, capillary     Status: Abnormal   Collection Time: 04/20/15 11:52 AM  Result Value Ref Range   Glucose-Capillary 417 (H) 65 - 99 mg/dL   Comment 1 Notify RN    Comment 2 Document in Chart   Glucose, capillary     Status: Abnormal   Collection Time: 04/20/15  5:08 PM  Result Value Ref Range   Glucose-Capillary 302 (H) 65 - 99 mg/dL  Glucose, capillary     Status: Abnormal   Collection Time: 04/20/15  9:17 PM  Result Value Ref Range   Glucose-Capillary 188 (H) 65 - 99 mg/dL  Glucose, capillary     Status: Abnormal   Collection Time: 04/21/15  6:22 AM  Result Value Ref Range   Glucose-Capillary 316 (H) 65 - 99 mg/dL  Glucose, capillary     Status: Abnormal    Collection Time: 04/21/15  4:53 PM  Result Value Ref Range   Glucose-Capillary 306 (H) 65 - 99 mg/dL   Comment 1 Notify RN    Comment 2 Document in Chart     Physical Findings: AIMS: Facial and Oral Movements Muscles of Facial Expression: None, normal Lips and Perioral Area: None, normal Jaw: None, normal Tongue: None, normal,Extremity Movements Upper (arms, wrists, hands, fingers): None, normal Lower (legs, knees, ankles, toes): None, normal, Trunk Movements Neck, shoulders, hips: None, normal, Overall Severity Severity of abnormal movements (highest score from questions above): None, normal Incapacitation due to abnormal movements: None, normal Patient's awareness of abnormal movements (rate only patient's report): No Awareness, Dental Status Current problems with teeth and/or dentures?: No Does patient usually wear dentures?: No  CIWA:  CIWA-Ar Total: 1 COWS:  COWS Total Score: 3  Musculoskeletal: Strength & Muscle Tone: within normal limits Gait & Station: normal Patient leans: normal  Psychiatric Specialty Exam: Review of Systems  Constitutional: Positive for malaise/fatigue.  HENT: Negative.   Eyes: Negative.   Respiratory: Negative.   Cardiovascular: Negative.   Gastrointestinal: Negative.   Genitourinary: Negative.   Musculoskeletal: Negative.   Skin: Negative.   Neurological: Positive for weakness.  Endo/Heme/Allergies: Negative.   Psychiatric/Behavioral: Positive for depression and substance abuse. The patient is nervous/anxious and has insomnia.     Blood pressure 113/76, pulse 105, temperature  98.3 F (36.8 C), temperature source Oral, resp. rate 16, height 5' 2.01" (1.575 m), weight 77.565 kg (171 lb), last menstrual period 03/25/2015.Body mass index is 31.27 kg/(m^2).  General Appearance: Fairly Groomed  Patent attorney::  Good  Speech:  Clear and Coherent  Volume:  Decreased  Mood:  Anxious and Depressed  Affect:  Restricted  Thought Process:   Coherent and Goal Directed  Orientation:  Full (Time, Place, and Person)  Thought Content:  symptoms events worries concerns  Suicidal Thoughts:  No  Homicidal Thoughts:  No  Memory:  Immediate;   Fair Recent;   Fair Remote;   Fair  Judgement:  Fair  Insight:  Present and Shallow  Psychomotor Activity:  Restlessness  Concentration:  Fair  Recall:  [employer] of Knowledge:Fair  Language: Fair  Akathisia:  No  Handed:  Right  AIMS (if indicated):     Assets:  Desire for Improvement  ADL's:  Intact  Cognition: WNL  Sleep:  Number of Hours: 4.25   Treatment Plan Summary: Daily contact with patient to assess and evaluate symptoms and progress in treatment and Medication management Supportive approach/coping skills Alcohol dependence: continue the Librium detox protocol/work a relapse prevention plan Neuropathy: use Neurontin 1000 mg TID Depression; continue the Prozac 40 mg daily Tooth pain; will use Tramadol 50 mg Q 6 PRN Glucose control; will get a diabetes consult Explore residential treatment options Glennette Galster A 04/21/2015, 6:02 PM

## 2015-04-21 NOTE — Consult Note (Signed)
[facility] Medical Consultation  Carolyn Ayala WUJ:[id number] DOB: Dec 02, 1975 DOA: 04/19/2015 PCP: No primary care provider on file.   Requesting physician: Psychiatry ([place] Alcario Drought) Date of consultation: 04/21/2015 Reason for consultation: hyperglycemia   Impression/Recommendations Principal Problem:   MDD (major depressive disorder), recurrent severe, without psychosis (HCC) Active Problems:   Uncontrolled diabetes mellitus without complication, with long-term current use of insulin (HCC)   Substance induced mood disorder (HCC)   IDDM (insulin dependent diabetes mellitus) (HCC)   PTSD (post-traumatic stress disorder)   Alcohol dependence with withdrawal, uncomplicated (HCC)  Chief Complaint: hyperglycemia   HPI:  39 year old female with past medical history of IDDM, hypertension, depression who presented to [facility] for treatment of MDD. TR[facility] consulted for input on hyperglycemia. Pt is not compliant with insulin and reports she asked to be on oral hyperglycemics. She does not want to use insulin. She says at this time she has no complaints but when her CBG's run high she usually gets blurred vision. No neuropathy issues at this time. No chest pain, palpations. No abdominal pain, nausea or vomiting.    Assessment and Plan:  Principal Problem:   MDD (major depressive disorder), recurrent severe, without psychosis (HCC) - Per psych  Active Problems:   Uncontrolled diabetes mellitus with diabetic neuropathy, with long-term current use of insulin (HCC) - A1c on this admission around 11 - Currently takes Lantus 22 units at bedtime - Add novolog 7 units TID - Check CBG's TID AC - Once we know CBG's in 24 hours period then we will adjust insulin accordingly  - DM coordinator consulted       Essential hypertension  - Continue lisinopril    Will follow up again tomorrow Thank you  Manson Passey [facility] [phone]    Review of Systems:   Constitutional: Negative for  fever, chills, diaphoresis, activity change, appetite change and fatigue.  HENT: Negative for ear pain, nosebleeds, congestion, facial swelling, rhinorrhea, neck pain, neck stiffness and ear discharge.   Eyes: Negative for pain, discharge, redness, itching and visual disturbance.  Respiratory: Negative for cough, choking, chest tightness, shortness of breath, wheezing and stridor.   Cardiovascular: Negative for chest pain, palpitations and leg swelling.  Gastrointestinal: Negative for abdominal distention.  Genitourinary: Negative for dysuria, urgency, frequency, hematuria, flank pain, decreased urine volume, difficulty urinating and dyspareunia.  Musculoskeletal: Negative for back pain, joint swelling, arthralgias and gait problem.  Neurological: Negative for dizziness, tremors, seizures, syncope, facial asymmetry, speech difficulty, weakness, light-headedness, numbness and headaches.  Hematological: Negative for adenopathy. Does not bruise/bleed easily.  Psychiatric/Behavioral: Negative for hallucinations, behavioral problems, confusion, dysphoric mood, decreased concentration and agitation.     Past Medical History  Diagnosis Date  . Mental disorder   . Depression   . Diabetes mellitus without complication (HCC)   . Hypertension    Past Surgical History  Procedure Laterality Date  . Cesarean section     Social History:  reports that she has been smoking Cigarettes.  She has been smoking about 1.00 pack per day. She uses smokeless tobacco. She reports that she drinks alcohol. She reports that she does not use illicit drugs.  No Known Allergies Family History  Problem Relation Age of Onset  . Depression Mother     Prior to Admission medications   Medication Sig Start Date End Date Taking? Authorizing Provider  cyclobenzaprine (FLEXERIL) 10 MG tablet Take 10 mg by mouth 3 (three) times daily as needed for muscle spasms.   Yes Historical Provider,  MD  FLUoxetine (PROZAC) 40 MG  capsule Take 1 capsule (40 mg total) by mouth every morning. 02/17/13  Yes Charm RingsJamison Y Lord, NP  gabapentin (NEURONTIN) 800 MG tablet Take 800 mg by mouth 4 (four) times daily.   Yes Historical Provider, MD  hydrOXYzine (ATARAX/VISTARIL) 50 MG tablet Take 100 mg by mouth every 6 (six) hours as needed for anxiety.   Yes Historical Provider, MD  insulin glargine (LANTUS) 100 UNIT/ML injection Inject 40-44 Units into the skin every morning. Take 40 units in the morning and 44 units at bedtime   Yes Historical Provider, MD  insulin lispro (HUMALOG) 100 UNIT/ML injection Inject 3-15 Units into the skin 3 (three) times daily before meals. Blood sugar less than 200  0 units 200-250 3-5 units Greater than 250 10 -15 units   Yes Historical Provider, MD  lisinopril (PRINIVIL,ZESTRIL) 5 MG tablet Take 1 tablet (5 mg total) by mouth daily. 02/17/13  Yes Charm RingsJamison Y Lord, NP  mirtazapine (REMERON) 30 MG tablet Take 1 tablet (30 mg total) by mouth at bedtime. 02/17/13  Yes Charm RingsJamison Y Lord, NP  traZODone (DESYREL) 50 MG tablet Take 25-50 mg by mouth daily as needed for sleep.   Yes Historical Provider, MD  loratadine (CLARITIN) 10 MG tablet Take 10 mg by mouth every morning.    Historical Provider, MD  metFORMIN (GLUCOPHAGE) 500 MG tablet Take 2 tablets (1,000 mg total) by mouth 2 (two) times daily with a meal. 02/17/13   Charm RingsJamison Y Lord, NP  Multiple Vitamin (MULTIVITAMIN WITH MINERALS) TABS tablet Take 1 tablet by mouth every morning.    Historical Provider, MD  traMADol (ULTRAM) 50 MG tablet Take 100 mg by mouth every 6 (six) hours as needed for pain.    Historical Provider, MD  triamcinolone cream (KENALOG) 0.1 % Apply 1 application topically 2 (two) times daily as needed (rash).    Historical Provider, MD   Physical Exam: Blood pressure 113/76, pulse 105, temperature 98.3 F (36.8 C), temperature source Oral, resp. rate 16, height 5' 2.01" (1.575 m), weight 77.565 kg (171 lb), last menstrual period 03/25/2015. Filed  Vitals:   04/21/15 1101  BP: 113/76  Pulse: 105  Temp:   Resp:    Physical Exam  Constitutional: Appears well-developed and well-nourished. No distress.  HENT: Normocephalic. External right and left ear normal. Oropharynx is clear and moist.  Eyes: Conjunctivae are normal. No scleral icterus.  Neck: Normal ROM. Neck supple. No JVD. No tracheal deviation. No thyromegaly.  CVS: RRR, S1/S2 +, no murmurs, no gallops, no carotid bruit.  Pulmonary: Effort and breath sounds normal, no stridor, rhonchi, wheezes, rales.  Abdominal: Soft. BS +,  no distension, tenderness, rebound or guarding.  Musculoskeletal: Normal range of motion. No edema and no tenderness.  Lymphadenopathy: No lymphadenopathy noted, cervical, inguinal. Neuro: Alert. Normal reflexes, muscle tone coordination. No cranial nerve deficit. Skin: Skin is warm and dry. No rash noted. Not diaphoretic. No erythema. No pallor.  Psychiatric: Normal mood and affect. Behavior, judgment, thought content normal.     Labs on Admission:  Basic Metabolic Panel:  Recent Labs Lab 04/17/15 0050  NA 133*  K 4.4  CL 99*  CO2 20*  GLUCOSE 412*  BUN 11  CREATININE 0.57  CALCIUM 9.7   Liver Function Tests: No results for input(s): AST, ALT, ALKPHOS, BILITOT, PROT, ALBUMIN in the last 168 hours. No results for input(s): LIPASE, AMYLASE in the last 168 hours. No results for input(s): AMMONIA in the last 168  hours. CBC:  Recent Labs Lab 04/17/15 0050  WBC 7.3  HGB 10.7*  HCT 33.7*  MCV 76.6*  PLT 441*   Cardiac Enzymes: No results for input(s): CKTOTAL, CKMB, CKMBINDEX, TROPONINI in the last 168 hours. BNP: Invalid input(s): POCBNP CBG:  Recent Labs Lab 04/20/15 0614 04/20/15 1152 04/20/15 1708 04/20/15 2117 04/21/15 0622  GLUCAP 156* 417* 302* 188* 316*    Radiological Exams on Admission: No results found.   Time spent: 55 minutes   Manson Passey [facility] Pager [phone]  If 7PM-7AM, please contact  night-coverage [email or web address] Password [facility] 04/21/2015, 12:23 PM

## 2015-04-21 NOTE — [facility] Group Notes (Addendum)

## 2015-04-21 NOTE — Progress Notes (Signed)
Jenine, diabetic nurse, pager [phone], called and stated MD simplified directions with affordable medication to help pt after discharge.  Discontinued novolog 5 units at 1700.  Discontinued lantus 22 unit.  New plan to administered novolog 70/30 bid, 40 units before breakfast and 40 units before supper.  Also sliding scale and metformin 1000 mg will continue.  Nurse discussed new directions with patient.

## 2015-04-21 NOTE — Progress Notes (Addendum)
Inpatient Diabetes Program Recommendations  AACE/ADA: New Consensus Statement on Inpatient Glycemic Control (2015)  Target Ranges:  Prepandial:   less than 140 mg/dL      Peak postprandial:   less than 180 mg/dL (1-2 hours)      Critically ill patients:  140 - 180 mg/dL   Results for Carolyn Ayala, Carolyn Ayala (MRN [id number]) as of 04/21/2015 13:19  Ref. Range 04/18/2015 06:45 04/18/2015 11:52 04/18/2015 18:05 04/18/2015 22:35  Glucose-Capillary Latest Ref Range: 65-99 mg/dL 725357 (H) 366377 (H) 440123 (H) 149 (H)   Results for Carolyn Ayala, Carolyn Ayala (MRN [id number]) as of 04/21/2015 13:19  Ref. Range 04/19/2015 02:36 04/19/2015 03:12 04/19/2015 07:10 04/19/2015 11:38 04/19/2015 17:51 04/19/2015 22:28  Glucose-Capillary Latest Ref Range: 65-99 mg/dL 57 (L) 87 387100 (H) 564213 (H) 286 (H) 251 (H)   Results for Carolyn Ayala, Carolyn Ayala (MRN [id number]) as of 04/21/2015 13:19  Ref. Range 04/20/2015 06:14 04/20/2015 11:52 04/20/2015 17:08 04/20/2015 21:17  Glucose-Capillary Latest Ref Range: 65-99 mg/dL 166156 (H) 063417 (H) 016302 (H) 188 (H)   Results for Carolyn Ayala, Carolyn Ayala (MRN [id number]) as of 04/21/2015 13:19  Ref. Range 04/21/2015 06:22  Glucose-Capillary Latest Ref Range: 65-99 mg/dL 732316 (H)   Results for Carolyn Ayala, Carolyn Ayala (MRN [id number]) as of 04/21/2015 13:19  Ref. Range 04/19/2015 18:14  Hemoglobin A1C Latest Ref Range: 4.8-5.6 % 11.5 (H)    Admit with: Depression  History: DM, HTN, ETOH  Home DM Meds: Lantus 40 units AM/ 44 units PM       Humalog 3-15 units tid per SSI       Metformin 1000 mg bid  Current Insulin Orders: Lantus 22 units QHS      Novolog Moderate SSI (0-15 units) TID AC + HS      Novolog 7 units tidwc (Meal Coverage)        Metformin 1000 mg bid    -Note patient had Hypoglycemia at 2:30 am on 11/06 after receiving a total of 84 units Lantus the day before on 11/05 (40 units in the AM and 44 units at bedtime).  -Has not had Hypoglycemia since that morning.  -Note that Novolog  Meal Coverage to resume this evening with supper- Novolog 7 units tid with meals.  Agree with Meal Coverage orders.  -Concern that patient likely needs more Lantus insulin.  She received a total of 84 units Lantus on 11/06 and her AM CBG on 11/07 was 156 mg/dl.  Patient only received a total of 62 units Lantus yesterday (11/07) and her AM CBG today was 316 mg/dl.     MD- Please consider increasing patient's dose of Lantus to 35 units bid   Note per MD notes that patient stated she does not want to take insulin at home.  Would prefer oral DM meds.  Based on her A1c and her insulin needs here in the hospital, I do not think patient could be adequately managed with oral DM meds alone at home.     --Will follow patient during hospitalization--  Ambrose FinlandJeannine Johnston [name] RN, MSN, CDE Diabetes Coordinator Inpatient Glycemic Control Team Team Pager: [phone] (8a-5p)

## 2015-04-21 NOTE — Progress Notes (Signed)
D: Pt presents anxious in affect and mood. Pt reports symptoms of anxiety and shakiness> Pt reports recent stressors of the loss of her mom and being newly diagnosed with Diabetes. Writer reviewed pt's Insulin orders with pt. Pt was informed of the difference between her short acting Novolog and 70/30 mix insulin. Pt was excited to have an improved CBG of 114. Pt was encouraged by staff to go to group, but did not attend. Pt denied any suicidal ideation.  A: Writer administered scheduled and prn medications to pt, per MD orders. Continued support and availability as needed was extended to this pt. Staff continue to monitor pt with q9115min checks.  R: No adverse drug reactions noted. Pt receptive to treatment. Pt remains safe at this time.

## 2015-04-21 NOTE — Progress Notes (Signed)
Pt was encouraged, however did not attend.

## 2015-04-21 NOTE — Progress Notes (Signed)
D:  Patient's self inventory sheet, patient has fair sleep, sleep medication is helpful.  Good appetite, hyper energy level, poor concentration.  Rated depression, hopeless and anxiety #10.  Withdrawals, tremors, diarrhea, chilling, cravings, cramping, agitation, nausea, runny nose, irritability.  SI, contracts for safety.  Feels shaky.  Requested tramadol or tooth ache.  Worst pain in past 24 hours is #7, headache.   Goal is to believe that she can do it with faith in God.  Plans to pray.  Thank you for working with me.  No discharge plans.  May have problems with diabetes after discharge. A:  Medications administered per MD orders.  Emotional support and encouragement given patient. R:  Patient stated she is SI off/on, contracts for safety, no plan.  Denied HI.  Denied A/V hallucinations.  Safety maintained with 15 minute checks.

## 2015-04-22 LAB — GLUCOSE, CAPILLARY
Glucose-Capillary: 388 mg/dL — ABNORMAL HIGH (ref 65–99)
Glucose-Capillary: 101 mg/dL — ABNORMAL HIGH (ref 65–99)
Glucose-Capillary: 41 mg/dL — CL (ref 65–99)
Glucose-Capillary: 45 mg/dL — ABNORMAL LOW (ref 65–99)
Glucose-Capillary: 462 mg/dL — ABNORMAL HIGH (ref 65–99)

## 2015-04-22 MED ORDER — INSULIN ASPART 100 UNIT/ML ~~LOC~~ SOLN
20.0000 [IU] | Freq: Once | SUBCUTANEOUS | Status: AC
  Administered 2015-04-22: 20 [IU] via SUBCUTANEOUS

## 2015-04-22 MED ORDER — FLUOXETINE HCL 20 MG PO CAPS
60.0000 mg | ORAL_CAPSULE | Freq: Every day | ORAL | Status: DC
  Administered 2015-04-23 – 2015-04-28 (×6): 60 mg via ORAL
  Filled 2015-04-22 (×7): qty 3

## 2015-04-22 MED ORDER — IBUPROFEN 800 MG PO TABS
800.0000 mg | ORAL_TABLET | Freq: Four times a day (QID) | ORAL | Status: DC | PRN
Start: 2015-04-22 — End: 2015-05-01
  Administered 2015-04-23 – 2015-04-29 (×4): 800 mg via ORAL
  Filled 2015-04-22 (×4): qty 1

## 2015-04-22 MED ORDER — GABAPENTIN 300 MG PO CAPS
600.0000 mg | ORAL_CAPSULE | Freq: Every day | ORAL | Status: DC
  Administered 2015-04-22 – 2015-04-27 (×6): 600 mg via ORAL
  Filled 2015-04-22 (×8): qty 2

## 2015-04-22 MED ORDER — FLUOXETINE HCL 20 MG PO CAPS
20.0000 mg | ORAL_CAPSULE | Freq: Every day | ORAL | Status: AC
  Administered 2015-04-22: 20 mg via ORAL
  Filled 2015-04-22: qty 1

## 2015-04-22 MED ORDER — INSULIN ASPART PROT & ASPART (70-30 MIX) 100 UNIT/ML ~~LOC~~ SUSP
35.0000 [IU] | Freq: Two times a day (BID) | SUBCUTANEOUS | Status: DC
  Administered 2015-04-22 – 2015-04-25 (×7): 35 [IU] via SUBCUTANEOUS

## 2015-04-22 NOTE — [facility] Group Notes (Signed)
[facility]   04/22/2015 11:36 AM  Participation Quality:  Appropriate   Mood/Affect:  Appropriate  Depression Rating:  9  Anxiety Rating:  10  Thoughts of Suicide:  No Will you contract for safety?   NA  Current AVH:  No  Plan for Discharge/Comments:  Pt reports that she is feeling better but her blood glucose levels are still high. [facility] Pt hopeful that she will be accepted later this week. She reports mild withdrawals and "pretty good sleep." Pt would like antidepressants increased and was encouraged to speak with Dr. Dub MikesLugo about this.   Transportation Means: unknown at this time.   Supports: boyfriend   Counselling psychologistmart, Conservation officer, natureHeather LCSW

## 2015-04-22 NOTE — Progress Notes (Signed)
This nurse tried to educate pt in regards to DM, pt not interested, poor eye contact, shrugging shoulders. Reports "I just want to eat" This nurse walked with individual to dining room, in dining room eating dinner, assigned MHT monitoring pt meal.

## 2015-04-22 NOTE — Progress Notes (Signed)
Hypoglycemic Event  CBG: 45  Treatment: 1 tube instant glucose  Symptoms: Shaky  Follow-up CBG: Time:2103 CBG Result:101  Possible Reasons for Event: Medication regimen: recent medication changes. Newly dx with diabetes  Comments/MD notified: Recheck in the morning. Current orders remain.   *Pt instructed to inform staff of any symptoms of hypoglycemia. Symptoms verbalized to pt. Pt verbally agrees to do such.     Carolyn Ayala A

## 2015-04-22 NOTE — [facility] Group Notes (Signed)
[facility] LCSW Group Therapy  04/22/2015 12:43 PM  Type of Therapy:  Group Therapy  Participation Level:  Did Not Attend-invited. Chose to remain in bed.   Modes of Intervention:  Confrontation, Discussion, Education, Exploration, Problem-solving, Rapport Building, Socialization and Support  Summary of Progress/Problems: Today's Topic: Overcoming Obstacles. Patients identified one short term goal and potential obstacles in reaching this goal. Patients processed barriers involved in overcoming these obstacles. Patients identified steps necessary for overcoming these obstacles and explored motivation (internal and external) for facing these difficulties head on.   Smart, Anastasiya Gowin LCSW 04/22/2015, 12:43 PM

## 2015-04-22 NOTE — Progress Notes (Signed)
CBG obtained 462 mg/dl. Dr. Dub MikesLugo notified, instructed to notify MD managing DM. Dr. Danie BinderIskra Myers paged, page returned new order: Give Novolog 20 units x 1 dose for CBG 462 mg/dl. Continue 70/30 Insulin as ordered. Will continue to monitor.

## 2015-04-22 NOTE — Progress Notes (Signed)
D:Per patient self inventory form pt reports she slept fair last night. Pt reports a fair appetite, low energy level,poor concentration. She rates depression 10/10, hopelessness 10/10, anxiety 10/10- all on 0-10 scale, 10 being the worse. Pt c/o withdrawal symptoms- anxiety. Pt c/o generalized pain. Pt reports her goal is "to get well and remain positive" and that she will "try harder" to help meet her goal."I would like to go up on my antidepressant" Pt reports passive SI, but verbally contracts for safety . Pt denies HI. Denies AVH. Observed socializing with peers on the unit.  A:Special checks q 15mins in place for safety. Medication administered per MD order (see eMAR) Encouragement and support provided.  R:safety maintained. Complaint with medication regimen. Will continue to monitor.

## 2015-04-22 NOTE — Progress Notes (Signed)
[facility] MD Progress Note  04/22/2015 3:27 PM Carolyn Ayala  MRN:  [id number] Subjective:  Carolyn Ayala continues to have a hard time.  Her blood sugar is still unstable. She admits she worries as she does not want to go back to her old habits. States she really needs to go to a residential treatment program. States that he mother dying and her being diagnosed with diabetes came at the same time. She has not been able to get over either of them Principal Problem: MDD (major depressive disorder), recurrent severe, without psychosis (HCC) Diagnosis:   Patient Active Problem List   Diagnosis Date Noted  . Uncontrolled diabetes mellitus with diabetic neuropathy, with long-term current use of insulin (HCC) [E11.40, Z79.4, E11.65] 04/21/2015  . PTSD (post-traumatic stress disorder) [F43.10] 04/20/2015  . Alcohol dependence with withdrawal, uncomplicated (HCC) [F10.230] 04/20/2015  . Suicidal ideation [R45.851] 04/19/2015  . Recent bereavement [Z78.9] 04/19/2015  . MDD (major depressive disorder), recurrent severe, without psychosis (HCC) [F33.2] 04/19/2015  . Generalized anxiety disorder [F41.1] 02/13/2013  . Chronic posttraumatic stress disorder [F43.12] 02/13/2013  . Alcoholism with alcohol dependence (HCC) [F10.20] 02/08/2013  . Substance induced mood disorder (HCC) [F19.94] 02/08/2013  . IDDM (insulin dependent diabetes mellitus) (HCC) [E11.9, Z79.4] 02/08/2013   Total Time spent with patient: 30 minutes  Past Psychiatric History: see admission H and P  Past Medical History:  Past Medical History  Diagnosis Date  . Mental disorder   . Depression   . Diabetes mellitus without complication (HCC)   . Hypertension     Past Surgical History  Procedure Laterality Date  . Cesarean section     Family History:  Family History  Problem Relation Age of Onset  . Depression Mother    Family Psychiatric  History: see Admission H and P Social History:  History  Alcohol Use  . Yes     Comment: daily     History  Drug Use No    Social History   Social History  . Marital Status: Single    Spouse Name: N/A  . Number of Children: N/A  . Years of Education: N/A   Social History Main Topics  . Smoking status: Current Every Day Smoker -- 1.00 packs/day    Types: Cigarettes  . Smokeless tobacco: Current User  . Alcohol Use: Yes     Comment: daily  . Drug Use: No  . Sexual Activity: No   Other Topics Concern  . None   Social History Narrative   ** Merged History Encounter **       Additional Social History:    Pain Medications: flexerilfor leg neuropathy 2 days ago Prescriptions: Insulin Levamir 60 units 2/D, Novalog befoe each meal sliding scale, Lisinopril 10mg  once daily, Gabapention 3x/D prn History of alcohol / drug use?: Yes Name of Substance 1: ETOH 1 - Age of First Use: 39 years of age 13 - Amount (size/oz): 2 40 oz beers daily 1 - Frequency: daily x 1 month 1 - Duration: since mother died a month ago 1 - Last Use / Amount: 2 days ago                  Sleep: Fair  Appetite:  Fair  Current Medications: Current Facility-Administered Medications  Medication Dose Route Frequency Provider Last Rate Last Dose  . acetaminophen (TYLENOL) tablet 650 mg  650 mg Oral Q6H PRN Oneta Rack, NP   650 mg at 04/21/15 0804  . alum & mag hydroxide-simeth (MAALOX/MYLANTA)  200-200-20 MG/5ML suspension 30 mL  30 mL Oral Q4H PRN Oneta Rack, NP      . carisoprodol (SOMA) tablet 350 mg  350 mg Oral QID PRN Rachael Fee, MD   350 mg at 04/22/15 0612  . chlordiazePOXIDE (LIBRIUM) capsule 25 mg  25 mg Oral Q6H PRN Rachael Fee, MD   25 mg at 04/21/15 2137  . chlordiazePOXIDE (LIBRIUM) capsule 25 mg  25 mg Oral BH-qamhs Rachael Fee, MD   25 mg at 04/22/15 0843   Followed by  . [START ON 04/23/2015] chlordiazePOXIDE (LIBRIUM) capsule 25 mg  25 mg Oral Daily Rachael Fee, MD      . feeding supplement (GLUCERNA SHAKE) (GLUCERNA SHAKE) liquid 237 mL  237 mL  Oral BID BM Anderson Malta [place], RD   237 mL at 04/22/15 1434  . [START ON 04/23/2015] FLUoxetine (PROZAC) capsule 60 mg  60 mg Oral Daily Rachael Fee, MD      . gabapentin (NEURONTIN) capsule 1,000 mg  1,000 mg Oral TID Rachael Fee, MD   1,000 mg at 04/22/15 1145  . gabapentin (NEURONTIN) capsule 600 mg  600 mg Oral QHS Rachael Fee, MD      . hydrOXYzine (ATARAX/VISTARIL) tablet 50 mg  50 mg Oral Q6H PRN Rachael Fee, MD   50 mg at 04/22/15 1436  . ibuprofen (ADVIL,MOTRIN) tablet 800 mg  800 mg Oral Q6H PRN Rachael Fee, MD      . Influenza vac split quadrivalent PF (FLUARIX) injection 0.5 mL  0.5 mL Intramuscular Tomorrow-1000 Beau Fanny, FNP   0.5 mL at 04/20/15 1346  . insulin aspart (novoLOG) injection 0-15 Units  0-15 Units Subcutaneous TID WC Oneta Rack, NP   15 Units at 04/22/15 0631  . insulin aspart (novoLOG) injection 0-5 Units  0-5 Units Subcutaneous QHS Oneta Rack, NP   3 Units at 04/19/15 2143  . insulin aspart protamine- aspart (NOVOLOG MIX 70/30) injection 35 Units  35 Units Subcutaneous BID WC Dorothea Ogle, MD      . lisinopril (PRINIVIL,ZESTRIL) tablet 5 mg  5 mg Oral Daily Oneta Rack, NP   5 mg at 04/22/15 0843  . loperamide (IMODIUM) capsule 2-4 mg  2-4 mg Oral PRN Worthy Flank, NP      . loratadine (CLARITIN) tablet 10 mg  10 mg Oral q morning - 10a Oneta Rack, NP   10 mg at 04/22/15 0946  . magnesium hydroxide (MILK OF MAGNESIA) suspension 30 mL  30 mL Oral Daily PRN Oneta Rack, NP      . metFORMIN (GLUCOPHAGE) tablet 1,000 mg  1,000 mg Oral BID WC Rachael Fee, MD   1,000 mg at 04/22/15 0843  . mirtazapine (REMERON) tablet 30 mg  30 mg Oral QHS Oneta Rack, NP   30 mg at 04/19/15 2200  . multivitamin with minerals tablet 1 tablet  1 tablet Oral Daily Worthy Flank, NP   1 tablet at 04/22/15 0843  . ondansetron (ZOFRAN-ODT) disintegrating tablet 4 mg  4 mg Oral Q6H PRN Worthy Flank, NP      . pneumococcal 23 valent vaccine  (PNU-IMMUNE) injection 0.5 mL  0.5 mL Intramuscular Tomorrow-1000 Beau Fanny, FNP   0.5 mL at 04/20/15 1346  . thiamine (B-1) injection 100 mg  100 mg Intramuscular Once Worthy Flank, NP   100 mg at 04/19/15 2145  . thiamine (VITAMIN B-1)  tablet 100 mg  100 mg Oral Daily Worthy FlankIjeoma E Nwaeze, NP   100 mg at 04/22/15 0843  . traMADol (ULTRAM) tablet 50 mg  50 mg Oral Q6H PRN Rachael FeeIrving A Cherokee Boccio, MD   50 mg at 04/22/15 [id number]    Lab Results:  Results for orders placed or performed during the hospital encounter of 04/19/15 (from the past 48 hour(s))  Glucose, capillary     Status: Abnormal   Collection Time: 04/20/15  5:08 PM  Result Value Ref Range   Glucose-Capillary 302 (H) 65 - 99 mg/dL  Glucose, capillary     Status: Abnormal   Collection Time: 04/20/15  9:17 PM  Result Value Ref Range   Glucose-Capillary 188 (H) 65 - 99 mg/dL  Glucose, capillary     Status: Abnormal   Collection Time: 04/21/15  6:22 AM  Result Value Ref Range   Glucose-Capillary 316 (H) 65 - 99 mg/dL  Glucose, capillary     Status: Abnormal   Collection Time: 04/21/15 11:57 AM  Result Value Ref Range   Glucose-Capillary 316 (H) 65 - 99 mg/dL  Glucose, capillary     Status: Abnormal   Collection Time: 04/21/15  4:53 PM  Result Value Ref Range   Glucose-Capillary 306 (H) 65 - 99 mg/dL   Comment 1 Notify RN    Comment 2 Document in Chart   Glucose, capillary     Status: Abnormal   Collection Time: 04/21/15  8:22 PM  Result Value Ref Range   Glucose-Capillary 114 (H) 65 - 99 mg/dL  Glucose, capillary     Status: Abnormal   Collection Time: 04/22/15  5:52 AM  Result Value Ref Range   Glucose-Capillary 388 (H) 65 - 99 mg/dL  Glucose, capillary     Status: Abnormal   Collection Time: 04/22/15 11:40 AM  Result Value Ref Range   Glucose-Capillary 41 (LL) 65 - 99 mg/dL  Glucose, capillary     Status: Abnormal   Collection Time: 04/22/15 11:53 AM  Result Value Ref Range   Glucose-Capillary 45 (L) 65 - 99 mg/dL   Glucose, capillary     Status: Abnormal   Collection Time: 04/22/15 12:08 PM  Result Value Ref Range   Glucose-Capillary 101 (H) 65 - 99 mg/dL    Physical Findings: AIMS: Facial and Oral Movements Muscles of Facial Expression: None, normal Lips and Perioral Area: None, normal Jaw: None, normal Tongue: None, normal,Extremity Movements Upper (arms, wrists, hands, fingers): None, normal Lower (legs, knees, ankles, toes): None, normal, Trunk Movements Neck, shoulders, hips: None, normal, Overall Severity Severity of abnormal movements (highest score from questions above): None, normal Incapacitation due to abnormal movements: None, normal Patient's awareness of abnormal movements (rate only patient's report): No Awareness, Dental Status Current problems with teeth and/or dentures?: No Does patient usually wear dentures?: No  CIWA:  CIWA-Ar Total: 4 COWS:  COWS Total Score: 3  Musculoskeletal: Strength & Muscle Tone: within normal limits Gait & Station: normal Patient leans: normal  Psychiatric Specialty Exam: Review of Systems  Constitutional: Negative.   HENT:       Tooth ache  Eyes: Negative.   Respiratory: Negative.   Cardiovascular: Negative.   Gastrointestinal: Negative.   Genitourinary: Negative.   Musculoskeletal: Negative.        Leg pain, spasms  Skin: Negative.   Neurological: Negative.   Endo/Heme/Allergies: Negative.   Psychiatric/Behavioral: Positive for depression and substance abuse. The patient is nervous/anxious.     Blood pressure 138/91, pulse 107, temperature  97.7 F (36.5 C), temperature source Oral, resp. rate 16, height 5' 2.01" (1.575 m), weight 77.565 kg (171 lb), last menstrual period 03/25/2015.Body mass index is 31.27 kg/(m^2).  General Appearance: Fairly Groomed  Patent attorney::  Fair  Speech:  Clear and Coherent  Volume:  fluctuates  Mood:  Anxious, Depressed and Dysphoric  Affect:  Depressed and Tearful  Thought Process:  Coherent and  Goal Directed  Orientation:  Full (Time, Place, and Person)  Thought Content:  symptoms events worries concerns  Suicidal Thoughts:  No  Homicidal Thoughts:  No  Memory:  Immediate;   Fair Recent;   Fair Remote;   Fair  Judgement:  Fair  Insight:  Present and Shallow  Psychomotor Activity:  Restlessness  Concentration:  Fair  Recall:  [employer] of Knowledge:Fair  Language: Fair  Akathisia:  No  Handed:  Right  AIMS (if indicated):     Assets:  Desire for Improvement  ADL's:  Intact  Cognition: WNL  Sleep:  Number of Hours: 4.25   Treatment Plan Summary: Daily contact with patient to assess and evaluate symptoms and progress in treatment and Medication management Supportive approach/coping skills Alcohol dependence:  Work a relapse prevention plan Depression: has been on Prozac 40 mg for more than 4 weeks now, will increase to 60 mg and reassess Diabetes; will continue to follow int med recommendations Pain-neuropathy: Will increase the Neurontin to max dose: adding 600 mg at HS Insomnia; will continue the Remeron 30 mg in combination with Neurontin 600 mg at HS might be more effective for sleep Use CBT/mindfulness Will explore other residential treatment options Kamron Portee A 04/22/2015, 3:27 PM

## 2015-04-22 NOTE — Progress Notes (Signed)
Patient ID: Carolyn Ayala, female   DOB: [birth year], 39 y.o.   MRN: [id number]  [facility] PROGRESS NOTE  Carolyn Ayala VQQ:[id number] DOB: 1976-05-24 DOA: 04/19/2015 PCP: No primary care provider on file.   Brief narrative:    39 year old female with past medical history of IDDM, hypertension, depression who presented to [facility] for treatment of MDD. [facility] consulted for input on hyperglycemia. Pt is not compliant with insulin and reports she asked to be on oral hyperglycemics. She does not want to use insulin. She says at this time she has no complaints but when her CBG's run high she usually gets blurred vision. No neuropathy issues at this time. No chest pain, palpations. No abdominal pain, nausea or vomiting.   Assessment/Plan:    Principal Problem:  MDD (major depressive disorder), recurrent severe, without psychosis (HCC) - Per psych  Active Problems:  Uncontrolled diabetes mellitus with diabetic neuropathy, with long-term current use of insulin (HCC) - A1c on this admission around 11 - changed insulin to 70/30, will lower the dose from 40 U to 35 U BID due to hypoglycemic events    Essential hypertension  - Continue lisinopril   DVT prophylaxis - ambulation   Code Status: Full.  Family Communication:  plan of care discussed with the patient Disposition Plan: Home when stable.   IV access:  Peripheral IV  Procedures and diagnostic studies:    Dg Chest 2 View  04/17/2015  CLINICAL DATA:  Central chest pain onset 2 hours ago while walking. EXAM: CHEST  2 VIEW COMPARISON:  None. FINDINGS: The heart size and mediastinal contours are within normal limits. Both lungs are clear. The visualized skeletal structures are unremarkable. IMPRESSION: No active cardiopulmonary disease. Electronically Signed   By: Ellery Plunk M.D.   On: 04/17/2015 00:14    Medical Consultants:  [facility]  Other Consultants:  None  IAnti-Infectives:   None  Debbora Presto, MD  [facility] Pager [phone]  If 7PM-7AM, please contact night-coverage [email or web address] Password [email or web address] 04/22/2015, 2:13 PM   LOS: 3 days   HPI/Subjective: No events overnight.   Objective: Filed Vitals:   04/22/15 0608 04/22/15 0609 04/22/15 0843 04/22/15 1200  BP: 115/76 110/73 115/76 138/91  Pulse: 102 102  107  Temp: 97.7 F (36.5 C)     TempSrc:      Resp: 16     Height:      Weight:       No intake or output data in the 24 hours ending 04/22/15 1413  Exam:   General:  Pt is alert, follows commands appropriately, not in acute distress  Data Reviewed: Basic Metabolic Panel:  Recent Labs Lab 04/17/15 0050  NA 133*  K 4.4  CL 99*  CO2 20*  GLUCOSE 412*  BUN 11  CREATININE 0.57  CALCIUM 9.7   Liver Function Tests: No results for input(s): AST, ALT, ALKPHOS, BILITOT, PROT, ALBUMIN in the last 168 hours. No results for input(s): LIPASE, AMYLASE in the last 168 hours. No results for input(s): AMMONIA in the last 168 hours. CBC:  Recent Labs Lab 04/17/15 0050  WBC 7.3  HGB 10.7*  HCT 33.7*  MCV 76.6*  PLT 441*   Cardiac Enzymes: No results for input(s): CKTOTAL, CKMB, CKMBINDEX, TROPONINI in the last 168 hours. BNP: Invalid input(s): POCBNP CBG:  Recent Labs Lab 04/21/15 2022 04/22/15 0552 04/22/15 1140 04/22/15 1153 04/22/15 1208  GLUCAP 114* 388* 41* 45* 101*    No results found for this  or any previous visit (from the past 240 hour(s)).   Scheduled Meds: . chlordiazePOXIDE  25 mg Oral BH-qamhs   Followed by  . [START ON 04/23/2015] chlordiazePOXIDE  25 mg Oral Daily  . feeding supplement (GLUCERNA SHAKE)  237 mL Oral BID BM  . [START ON 04/23/2015] FLUoxetine  60 mg Oral Daily  . gabapentin  1,000 mg Oral TID  . Influenza vac split quadrivalent PF  0.5 mL Intramuscular Tomorrow-1000  . insulin aspart  0-15 Units Subcutaneous TID WC  . insulin aspart  0-5 Units Subcutaneous QHS  . insulin aspart protamine- aspart  35 Units  Subcutaneous BID WC  . lisinopril  5 mg Oral Daily  . loratadine  10 mg Oral q morning - 10a  . metFORMIN  1,000 mg Oral BID WC  . mirtazapine  30 mg Oral QHS  . multivitamin with minerals  1 tablet Oral Daily  . pneumococcal 23 valent vaccine  0.5 mL Intramuscular Tomorrow-1000  . thiamine  100 mg Intramuscular Once  . thiamine  100 mg Oral Daily   Continuous Infusions:

## 2015-04-22 NOTE — Progress Notes (Signed)
This nurse sat down with pt after dinner for pt education in regards to meal choices . Pt more receptive to healthy eating education. Will continue to monitor.

## 2015-04-22 NOTE — Progress Notes (Signed)
Recreation Therapy Notes  Date: 04/22/15 Time: 930 Location: 300 Hall Dayroom  Group Topic: Stress Management  Goal Area(s) Addresses:  Patient will verbalize importance of using healthy stress management.  Patient will identify positive emotions associated with healthy stress management.   Intervention: Stress Management  Activity :  Progressive Muscle Relaxation.  LRT introduced and educated patients on stress management technique of progressive muscle relaxation.  A script was used to deliver the technique to patients and patients were asked to follow script read allowed by LRT to engage in practicing the stress management technique.    Education:  Stress Management, Discharge Planning.   Education Outcome: Acknowledges edcuation/In group clarification offered/Needs additional education  Clinical Observations/Feedback:  Patient did not attend group.    Caroll RancherMarjette Carlin Mamone, LRT/CTRS         Caroll RancherLindsay, Atley Neubert A 04/22/2015 4:12 PM

## 2015-04-22 NOTE — Progress Notes (Signed)
Pt did not attend wrap up group this evening.  

## 2015-04-22 NOTE — Progress Notes (Addendum)
Hypoglycemic Event  CBG: 1130: 41 mg/dl  Treatment: 15 GM carbohydrate snack  Symptoms: Sweaty and Shaky  Follow-up CBG: Time:1145: CBG Result:45 mg/dl  Treatment: 15 GM carbohydrate snack  Symptoms: sweaty  Follow -up CBG: Time: 1200 CBG result: 101 mg/dl  Possible Reasons for Event: Unknown  Comments/MD notified:MD notified; no new orders given.     Dara HoyerAshley N Armany Mano

## 2015-04-23 LAB — GLUCOSE, CAPILLARY
Glucose-Capillary: 101 mg/dL — ABNORMAL HIGH (ref 65–99)
Glucose-Capillary: 45 mg/dL — ABNORMAL LOW (ref 65–99)
Glucose-Capillary: 388 mg/dL — ABNORMAL HIGH (ref 65–99)
Glucose-Capillary: 158 mg/dL — ABNORMAL HIGH (ref 65–99)
Glucose-Capillary: 352 mg/dL — ABNORMAL HIGH (ref 65–99)
Glucose-Capillary: 198 mg/dL — ABNORMAL HIGH (ref 65–99)

## 2015-04-23 MED ORDER — HYDROXYZINE HCL 50 MG PO TABS
50.0000 mg | ORAL_TABLET | ORAL | Status: DC | PRN
  Administered 2015-04-23 – 2015-04-28 (×20): 50 mg via ORAL
  Filled 2015-04-23 (×21): qty 1

## 2015-04-23 MED ORDER — LOPERAMIDE HCL 2 MG PO CAPS
4.0000 mg | ORAL_CAPSULE | ORAL | Status: DC | PRN
  Administered 2015-04-23 – 2015-04-30 (×7): 4 mg via ORAL
  Filled 2015-04-23 (×7): qty 2

## 2015-04-23 NOTE — Progress Notes (Signed)
D: Per patient self inventory form pt reports she slept fair last night. She reports a good appetite, low energy level. Pt rates depression 10/10 on 0-10 scale, 10 being the worse. Pt denies SI/HI. Denies AVH. Pt observed socializing with peers on the unit. Behavior calm and cooperative.  A:Special checks q 15 mins in place for safety. Medication administered per MD order(See eMAR). Encouragement and support provided.  R:Safety maintained. Compliant with medication regimen. More receptive to treatment. Will continue to monitor.

## 2015-04-23 NOTE — Progress Notes (Signed)
D: Pt presents flat, depressed, and anxious this evening.  Pt reminisces on the past holidays with her mother. Pt inquired about any available doses of librium for her withdrawals. Pt informed that her daily dose of librium was completed. Pt endorses body aches, anxiety, and tremors. Tremors not felt or visible to Clinical research associatewriter. Pt denies any suicidal ideation at this time. Pt attended karaoke.  A: Writer administered scheduled and prn medications to pt, per MD orders. Continued support and availability as needed was extended to this pt. Staff continues to monitor pt with q9515min checks.  R: No adverse drug reactions noted. Pt receptive to treatment. Pt remains safe at this time.

## 2015-04-23 NOTE — Progress Notes (Addendum)
Inpatient Diabetes Program Recommendations  AACE/ADA: New Consensus Statement on Inpatient Glycemic Control (2015)  Target Ranges:  Prepandial:   less than 140 mg/dL      Peak postprandial:   less than 180 mg/dL (1-2 hours)      Critically ill patients:  140 - 180 mg/dL    Results for [name], Carolyn Ayala (MRN [id number]) as of 04/23/2015 08:03  Ref. Range 04/22/2015 05:52 04/22/2015 11:40 04/22/2015 11:53 04/22/2015 12:08 04/22/2015 16:54 04/22/2015 20:45 04/22/2015 21:03  Glucose-Capillary Latest Ref Range: 65-99 mg/dL 409388 (H) 41 (LL) 45 (L) 101 (H) 462 (H) 45 (L) 101 (H)    Results for [name], Carolyn Ayala (MRN [id number]) as of 04/23/2015 08:03  Ref. Range 04/23/2015 05:55  Glucose-Capillary Latest Ref Range: 65-99 mg/dL 956388 (H)    Current Insulin Orders: 70/30 insulin- 35 units bidwc      Novolog Moderate SSI (0-15 units) TID AC + HS      Metformin 1000 mg bid     -Note patient switched to 70/30 insulin bid with meals on the evening of 11/08.  This was done to make patient's insulin regimen more affordable and easier for patient to do at home (bid dosing versus QID dosing with Lantus and Humalog that she was taking previously).  -Note patient had severe Hypoglycemia yesterday at lunch time and again at bedtime.  These Hypoglycemic events were likely due to the fact that patient received a large dose of Novolog SSI along with her 70/30 insulin dose which contains a fair amount of Novolog as well.  Patient received 15 units Novolog SSI + 12 units Novolog in her 70/30 dose yesterday morning with breakfast for a total of 27 units rapid-acting insulin.  CBG at 12pm dropped to 41 mg/dl.  Patient then received 20 units Novolog SSI at supper time + 11 units Novolog in her suppertime dose of 70/30 insulin for a total of 31 units rapid-acting insulin.  CBG by bedtime dropped to 45 mg/dl.  -Note 21/3070/30 insulin dose reduced to 35 units bidwc yesterday evening.  -I think we probably need to  reduce patient's Novolog SSI to a more Sensitive scale so that she won't receive such a large dose of Novolog at any given time.     MD- Please reduce patient's Novolog SSI to Sensitive scale (0-9 units) TID AC + HS     --Will follow patient during hospitalization--  Ambrose FinlandJeannine Johnston Arlander Gillen RN, MSN, CDE Diabetes Coordinator Inpatient Glycemic Control Team Team Pager: [phone] (8a-5p)

## 2015-04-23 NOTE — [facility] Group Notes (Signed)
[facility] LCSW Group Therapy  04/23/2015 12:45 PM  Type of Therapy:  Group Therapy  Participation Level:  Minimal  Participation Quality:  Drowsy  Affect:  Blunted  Cognitive:  Oriented  Insight:  Limited  Engagement in Therapy:  Limited  Modes of Intervention:  Confrontation, Discussion, Education, Exploration, Problem-solving, Rapport Building, Socialization and Support  Summary of Progress/Problems: Emotion Regulation: This group focused on both positive and negative emotion identification and allowed group members to process ways to identify feelings, regulate negative emotions, and find healthy ways to manage internal/external emotions. Group members were asked to reflect on a time when their reaction to an emotion led to a negative outcome and explored how alternative responses using emotion regulation would have benefited them. Group members were also asked to discuss a time when emotion regulation was utilized when a negative emotion was experienced. Liliauna was attentive during group but was resistant to active participation. She asked to speak with CSW privately. Ayse continues to struggle with participation in the group setting and appears anxious in group but is able to speak to CSW effectively. She remains attentive when others are speaking.   Smart, Latrell Potempa LCSW 04/23/2015, 12:45 PM

## 2015-04-23 NOTE — Progress Notes (Addendum)
D:  Pt up interacting with peers especially with roommate. Denies HI/SI/AVH. Marland Kitchen.Request PRN for muscle aches.Patient sates goal for today is to try to stabilize her blood sugar.  A:Soma given for muscle spasms.  Will continue to monitor for safety.Discussed healthy eating habits with patient.:   R: States relief form pain.  States she is going to try to eat better and excerise..Marland Kitchen

## 2015-04-23 NOTE — Progress Notes (Signed)
[facility] MD Progress Note  04/23/2015 5:46 PM Carolyn Ayala  MRN:  [id number] Subjective:  Carolyn Ayala continues to worry over her glucose control and her being able to go to a residential program if it does not get any better. Continues to worry and ruminate about the long term complications of diabetes Principal Problem: MDD (major depressive disorder), recurrent severe, without psychosis (HCC) Diagnosis:   Patient Active Problem List   Diagnosis Date Noted  . Uncontrolled diabetes mellitus with diabetic neuropathy, with long-term current use of insulin (HCC) [E11.40, Z79.4, E11.65] 04/21/2015  . PTSD (post-traumatic stress disorder) [F43.10] 04/20/2015  . Alcohol dependence with withdrawal, uncomplicated (HCC) [F10.230] 04/20/2015  . Suicidal ideation [R45.851] 04/19/2015  . Recent bereavement [Z78.9] 04/19/2015  . MDD (major depressive disorder), recurrent severe, without psychosis (HCC) [F33.2] 04/19/2015  . Generalized anxiety disorder [F41.1] 02/13/2013  . Chronic posttraumatic stress disorder [F43.12] 02/13/2013  . Alcoholism with alcohol dependence (HCC) [F10.20] 02/08/2013  . Substance induced mood disorder (HCC) [F19.94] 02/08/2013  . IDDM (insulin dependent diabetes mellitus) (HCC) [E11.9, Z79.4] 02/08/2013   Total Time spent with patient: 20 minutes  Past Psychiatric History: see Admission H and P  Past Medical History:  Past Medical History  Diagnosis Date  . Mental disorder   . Depression   . Diabetes mellitus without complication (HCC)   . Hypertension     Past Surgical History  Procedure Laterality Date  . Cesarean section     Family History:  Family History  Problem Relation Age of Onset  . Depression Mother    Family Psychiatric  History: see Admission H and P Social History:  History  Alcohol Use  . Yes    Comment: daily     History  Drug Use No    Social History   Social History  . Marital Status: Single    Spouse Name: N/A  . Number of  Children: N/A  . Years of Education: N/A   Social History Main Topics  . Smoking status: Current Every Day Smoker -- 1.00 packs/day    Types: Cigarettes  . Smokeless tobacco: Current User  . Alcohol Use: Yes     Comment: daily  . Drug Use: No  . Sexual Activity: No   Other Topics Concern  . None   Social History Narrative   ** Merged History Encounter **       Additional Social History:    Pain Medications: flexerilfor leg neuropathy 2 days ago Prescriptions: Insulin Levamir 60 units 2/D, Novalog befoe each meal sliding scale, Lisinopril  once daily, Gabapention 3x/D prn History of alcohol / drug use?: Yes Name of Substance 1: ETOH 1 - Age of First Use: 39 years of age 45 - Amount (size/oz): 2 40 oz beers daily 1 - Frequency: daily x 1 month 1 - Duration: since mother died a month ago 1 - Last Use / Amount: 2 days ago                  Sleep: Fair  Appetite:  Fair  Current Medications: Current Facility-Administered Medications  Medication Dose Route Frequency Provider Last Rate Last Dose  . acetaminophen (TYLENOL) tablet 650 mg  650 mg Oral Q6H PRN Oneta Rack, NP   650 mg at 04/21/15 0804  . alum & mag hydroxide-simeth (MAALOX/MYLANTA) 200-200-20 MG/5ML suspension 30 mL  30 mL Oral Q4H PRN Oneta Rack, NP      . carisoprodol (SOMA) tablet 350 mg  350 mg Oral QID  PRN Rachael FeeIrving A Asenath Balash, MD   350 mg at 04/23/15 0607  . feeding supplement (GLUCERNA SHAKE) (GLUCERNA SHAKE) liquid 237 mL  237 mL Oral BID BM Anderson MaltaJessica M Ostheim, RD   237 mL at 04/23/15 1345  . FLUoxetine (PROZAC) capsule 60 mg  60 mg Oral Daily Rachael FeeIrving A Kasra Melvin, MD   60 mg at 04/23/15 0818  . gabapentin (NEURONTIN) capsule 1,000 mg  1,000 mg Oral TID Rachael FeeIrving A Venecia Mehl, MD   1,000 mg at 04/23/15 1702  . gabapentin (NEURONTIN) capsule 600 mg  600 mg Oral QHS Rachael FeeIrving A Jameyah Fennewald, MD   600 mg at 04/22/15 2157  . hydrOXYzine (ATARAX/VISTARIL) tablet 50 mg  50 mg Oral Q4H PRN Rachael FeeIrving A Jaiven Graveline, MD   50 mg at 04/23/15 1345   . ibuprofen (ADVIL,MOTRIN) tablet 800 mg  800 mg Oral Q6H PRN Rachael FeeIrving A Sherika Kubicki, MD      . Influenza vac split quadrivalent PF (FLUARIX) injection 0.5 mL  0.5 mL Intramuscular Tomorrow-1000 Beau FannyJohn C Withrow, FNP   0.5 mL at 04/20/15 1346  . insulin aspart (novoLOG) injection 0-15 Units  0-15 Units Subcutaneous TID WC Oneta Rackanika N Lewis, NP   15 Units at 04/23/15 1704  . insulin aspart (novoLOG) injection 0-5 Units  0-5 Units Subcutaneous QHS Oneta Rackanika N Lewis, NP   3 Units at 04/19/15 2143  . insulin aspart protamine- aspart (NOVOLOG MIX 70/30) injection 35 Units  35 Units Subcutaneous BID WC Dorothea OgleIskra M Myers, MD   35 Units at 04/23/15 1704  . lisinopril (PRINIVIL,ZESTRIL) tablet 5 mg  5 mg Oral Daily Oneta Rackanika N Lewis, NP   5 mg at 04/23/15 0818  . loperamide (IMODIUM) capsule 4 mg  4 mg Oral PRN Rachael FeeIrving A Almena Hokenson, MD   4 mg at 04/23/15 1345  . loratadine (CLARITIN) tablet 10 mg  10 mg Oral q morning - 10a Oneta Rackanika N Lewis, NP   10 mg at 04/23/15 0818  . magnesium hydroxide (MILK OF MAGNESIA) suspension 30 mL  30 mL Oral Daily PRN Oneta Rackanika N Lewis, NP      . metFORMIN (GLUCOPHAGE) tablet 1,000 mg  1,000 mg Oral BID WC Rachael FeeIrving A Mariella Blackwelder, MD   1,000 mg at 04/23/15 1703  . mirtazapine (REMERON) tablet 30 mg  30 mg Oral QHS Oneta Rackanika N Lewis, NP   30 mg at 04/19/15 2200  . multivitamin with minerals tablet 1 tablet  1 tablet Oral Daily Worthy FlankIjeoma E Nwaeze, NP   1 tablet at 04/23/15 0819  . pneumococcal 23 valent vaccine (PNU-IMMUNE) injection 0.5 mL  0.5 mL Intramuscular Tomorrow-1000 Beau FannyJohn C Withrow, FNP   0.5 mL at 04/20/15 1346  . thiamine (B-1) injection 100 mg  100 mg Intramuscular Once Worthy FlankIjeoma E Nwaeze, NP   100 mg at 04/19/15 2145  . thiamine (VITAMIN B-1) tablet 100 mg  100 mg Oral Daily Worthy FlankIjeoma E Nwaeze, NP   100 mg at 04/23/15 0819  . traMADol (ULTRAM) tablet 50 mg  50 mg Oral Q6H PRN Rachael FeeIrving A Tysha Grismore, MD   50 mg at 04/23/15 1347    Lab Results:  Results for orders placed or performed during the hospital encounter of 04/19/15 (from  the past 48 hour(s))  Glucose, capillary     Status: Abnormal   Collection Time: 04/21/15  8:22 PM  Result Value Ref Range   Glucose-Capillary 114 (H) 65 - 99 mg/dL  Glucose, capillary     Status: Abnormal   Collection Time: 04/22/15  5:52 AM  Result Value Ref  Range   Glucose-Capillary 388 (H) 65 - 99 mg/dL  Glucose, capillary     Status: Abnormal   Collection Time: 04/22/15 11:40 AM  Result Value Ref Range   Glucose-Capillary 41 (LL) 65 - 99 mg/dL  Glucose, capillary     Status: Abnormal   Collection Time: 04/22/15 11:53 AM  Result Value Ref Range   Glucose-Capillary 45 (L) 65 - 99 mg/dL  Glucose, capillary     Status: Abnormal   Collection Time: 04/22/15 12:08 PM  Result Value Ref Range   Glucose-Capillary 101 (H) 65 - 99 mg/dL  Glucose, capillary     Status: Abnormal   Collection Time: 04/22/15  4:54 PM  Result Value Ref Range   Glucose-Capillary 462 (H) 65 - 99 mg/dL  Glucose, capillary     Status: Abnormal   Collection Time: 04/22/15  8:45 PM  Result Value Ref Range   Glucose-Capillary 45 (L) 65 - 99 mg/dL  Glucose, capillary     Status: Abnormal   Collection Time: 04/22/15  9:03 PM  Result Value Ref Range   Glucose-Capillary 101 (H) 65 - 99 mg/dL  Glucose, capillary     Status: Abnormal   Collection Time: 04/23/15  5:55 AM  Result Value Ref Range   Glucose-Capillary 388 (H) 65 - 99 mg/dL  Glucose, capillary     Status: Abnormal   Collection Time: 04/23/15 11:56 AM  Result Value Ref Range   Glucose-Capillary 158 (H) 65 - 99 mg/dL  Glucose, capillary     Status: Abnormal   Collection Time: 04/23/15  4:43 PM  Result Value Ref Range   Glucose-Capillary 352 (H) 65 - 99 mg/dL    Physical Findings: AIMS: Facial and Oral Movements Muscles of Facial Expression: None, normal Lips and Perioral Area: None, normal Jaw: None, normal Tongue: None, normal,Extremity Movements Upper (arms, wrists, hands, fingers): None, normal Lower (legs, knees, ankles, toes): None, normal,  Trunk Movements Neck, shoulders, hips: None, normal, Overall Severity Severity of abnormal movements (highest score from questions above): None, normal Incapacitation due to abnormal movements: None, normal Patient's awareness of abnormal movements (rate only patient's report): No Awareness, Dental Status Current problems with teeth and/or dentures?: No Does patient usually wear dentures?: No  CIWA:  CIWA-Ar Total: 1 COWS:  COWS Total Score: 3  Musculoskeletal: Strength & Muscle Tone: within normal limits Gait & Station: normal Patient leans: normal  Psychiatric Specialty Exam: Review of Systems  Constitutional: Negative.   HENT:       Tooth pain  Eyes: Negative.   Respiratory: Negative.   Cardiovascular: Negative.   Gastrointestinal: Negative.   Genitourinary: Negative.   Musculoskeletal: Negative.        Leg pain  Skin: Negative.   Neurological: Negative.   Endo/Heme/Allergies: Negative.   Psychiatric/Behavioral: Positive for depression and substance abuse. The patient is nervous/anxious.     Blood pressure 116/66, pulse 105, temperature 98.2 F (36.8 C), temperature source Oral, resp. rate 16, height 5' 2.01" (1.575 m), weight 77.565 kg (171 lb), last menstrual period 03/25/2015.Body mass index is 31.27 kg/(m^2).  General Appearance: Fairly Groomed  Patent attorney::  Fair  Speech:  Clear and Coherent  Volume:  Decreased  Mood:  Anxious and worried  Affect:  anxious worried  Thought Process:  Coherent and Goal Directed  Orientation:  Full (Time, Place, and Person)  Thought Content:  symptoms events worries concerns  Suicidal Thoughts:  No  Homicidal Thoughts:  No  Memory:  Immediate;   Fair Recent;  Fair Remote;   Fair  Judgement:  Fair  Insight:  Present and Shallow  Psychomotor Activity:  Restlessness  Concentration:  Fair  Recall:  [employer] of Knowledge:Fair  Language: Fair  Akathisia:  No  Handed:  Right  AIMS (if indicated):     Assets:  Desire for  Improvement  ADL's:  Intact  Cognition: WNL  Sleep:  Number of Hours: 4.25   Treatment Plan Summary: Daily contact with patient to assess and evaluate symptoms and progress in treatment and Medication management Supportive approach/coping skills Alcohol dependence: continue to work a relapse prevention plan Depression; continue the Prozac at 60 mg daily Insomnia; continue the Remeron 30 mg HS Pain; continue the higher dose of Neurontin; 3600 mg daily Diabetes; continue to follow Int Med recommendations Use CBT/mindfulness Explore residential treatment options Qiana Landgrebe A 04/23/2015, 5:46 PM

## 2015-04-24 LAB — GLUCOSE, CAPILLARY: Glucose-Capillary: 460 mg/dL — ABNORMAL HIGH (ref 65–99)

## 2015-04-24 MED ORDER — LIVING WELL WITH DIABETES BOOK
Freq: Once | Status: AC
  Administered 2015-04-24: 1
  Filled 2015-04-24: qty 1

## 2015-04-24 NOTE — Progress Notes (Signed)
CBG of 460  On-call provider contacted:  Christen BameIjeoma, NP 15 units Novolog administered, per On-call provider Follow-up CBG post-breakfast

## 2015-04-24 NOTE — Progress Notes (Signed)
[facility] MD Progress Note  04/24/2015 2:34 PM Carolyn Ayala  MRN:  [id number] Subjective:  Porfiria is still worried about his glucose control. States she is still having a hard time. States it hit her too hard. States she had been dealing with the death of her mother when she was diagnosed with Diabetes. Feeling very overwhelmed. Afraid of relapsing if out there trying to deal with the way she is feeling. States she really needs and wants to go to rehab Principal Problem: MDD (major depressive disorder), recurrent severe, without psychosis (HCC) Diagnosis:   Patient Active Problem List   Diagnosis Date Noted  . Uncontrolled diabetes mellitus with diabetic neuropathy, with long-term current use of insulin (HCC) [E11.40, Z79.4, E11.65] 04/21/2015  . PTSD (post-traumatic stress disorder) [F43.10] 04/20/2015  . Alcohol dependence with withdrawal, uncomplicated (HCC) [F10.230] 04/20/2015  . Suicidal ideation [R45.851] 04/19/2015  . Recent bereavement [Z78.9] 04/19/2015  . MDD (major depressive disorder), recurrent severe, without psychosis (HCC) [F33.2] 04/19/2015  . Generalized anxiety disorder [F41.1] 02/13/2013  . Chronic posttraumatic stress disorder [F43.12] 02/13/2013  . Alcoholism with alcohol dependence (HCC) [F10.20] 02/08/2013  . Substance induced mood disorder (HCC) [F19.94] 02/08/2013  . IDDM (insulin dependent diabetes mellitus) (HCC) [E11.9, Z79.4] 02/08/2013   Total Time spent with patient: 30 minutes  Past Psychiatric History: see Admission H and P  Past Medical History:  Past Medical History  Diagnosis Date  . Mental disorder   . Depression   . Diabetes mellitus without complication (HCC)   . Hypertension     Past Surgical History  Procedure Laterality Date  . Cesarean section     Family History:  Family History  Problem Relation Age of Onset  . Depression Mother    Family Psychiatric  History: see Admission H and P Social History:  History  Alcohol Use   . Yes    Comment: daily     History  Drug Use No    Social History   Social History  . Marital Status: Single    Spouse Name: N/A  . Number of Children: N/A  . Years of Education: N/A   Social History Main Topics  . Smoking status: Current Every Day Smoker -- 1.00 packs/day    Types: Cigarettes  . Smokeless tobacco: Current User  . Alcohol Use: Yes     Comment: daily  . Drug Use: No  . Sexual Activity: No   Other Topics Concern  . None   Social History Narrative   ** Merged History Encounter **       Additional Social History:    Pain Medications: flexerilfor leg neuropathy 2 days ago Prescriptions: Insulin Levamir 60 units 2/D, Novalog befoe each meal sliding scale, Lisinopril  once daily, Gabapention 3x/D prn History of alcohol / drug use?: Yes Name of Substance 1: ETOH 1 - Age of First Use: 39 years of age 15 - Amount (size/oz): 2 40 oz beers daily 1 - Frequency: daily x 1 month 1 - Duration: since mother died a month ago 1 - Last Use / Amount: 2 days ago                  Sleep: Fair  Appetite:  Fair  Current Medications: Current Facility-Administered Medications  Medication Dose Route Frequency Provider Last Rate Last Dose  . acetaminophen (TYLENOL) tablet 650 mg  650 mg Oral Q6H PRN Oneta Rack, NP   650 mg at 04/21/15 0804  . alum & mag hydroxide-simeth (MAALOX/MYLANTA) 200-200-20  MG/5ML suspension 30 mL  30 mL Oral Q4H PRN Oneta Rack, NP      . carisoprodol (SOMA) tablet 350 mg  350 mg Oral QID PRN Rachael Fee, MD   350 mg at 04/24/15 0644  . feeding supplement (GLUCERNA SHAKE) (GLUCERNA SHAKE) liquid 237 mL  237 mL Oral BID BM Anderson Malta [place], RD   237 mL at 04/24/15 1400  . FLUoxetine (PROZAC) capsule 60 mg  60 mg Oral Daily Rachael Fee, MD   60 mg at 04/24/15 0859  . gabapentin (NEURONTIN) capsule 1,000 mg  1,000 mg Oral TID Rachael Fee, MD   1,000 mg at 04/24/15 1111  . gabapentin (NEURONTIN) capsule 600 mg  600 mg Oral QHS  Rachael Fee, MD   600 mg at 04/23/15 2224  . hydrOXYzine (ATARAX/VISTARIL) tablet 50 mg  50 mg Oral Q4H PRN Rachael Fee, MD   50 mg at 04/24/15 1114  . ibuprofen (ADVIL,MOTRIN) tablet 800 mg  800 mg Oral Q6H PRN Rachael Fee, MD   800 mg at 04/23/15 1837  . Influenza vac split quadrivalent PF (FLUARIX) injection 0.5 mL  0.5 mL Intramuscular Tomorrow-1000 Beau Fanny, FNP   0.5 mL at 04/20/15 1346  . insulin aspart (novoLOG) injection 0-15 Units  0-15 Units Subcutaneous TID WC Oneta Rack, NP   5 Units at 04/24/15 1214  . insulin aspart (novoLOG) injection 0-5 Units  0-5 Units Subcutaneous QHS Oneta Rack, NP   3 Units at 04/19/15 2143  . insulin aspart protamine- aspart (NOVOLOG MIX 70/30) injection 35 Units  35 Units Subcutaneous BID WC Dorothea Ogle, MD   35 Units at 04/24/15 0907  . lisinopril (PRINIVIL,ZESTRIL) tablet 5 mg  5 mg Oral Daily Oneta Rack, NP   5 mg at 04/24/15 0859  . loperamide (IMODIUM) capsule 4 mg  4 mg Oral PRN Rachael Fee, MD   4 mg at 04/24/15 0904  . loratadine (CLARITIN) tablet 10 mg  10 mg Oral q morning - 10a Oneta Rack, NP   10 mg at 04/24/15 1111  . magnesium hydroxide (MILK OF MAGNESIA) suspension 30 mL  30 mL Oral Daily PRN Oneta Rack, NP      . metFORMIN (GLUCOPHAGE) tablet 1,000 mg  1,000 mg Oral BID WC Rachael Fee, MD   1,000 mg at 04/24/15 0858  . mirtazapine (REMERON) tablet 30 mg  30 mg Oral QHS Oneta Rack, NP   30 mg at 04/19/15 2200  . multivitamin with minerals tablet 1 tablet  1 tablet Oral Daily Worthy Flank, NP   1 tablet at 04/24/15 0858  . pneumococcal 23 valent vaccine (PNU-IMMUNE) injection 0.5 mL  0.5 mL Intramuscular Tomorrow-1000 Beau Fanny, FNP   0.5 mL at 04/20/15 1346  . thiamine (B-1) injection 100 mg  100 mg Intramuscular Once Worthy Flank, NP   100 mg at 04/19/15 2145  . thiamine (VITAMIN B-1) tablet 100 mg  100 mg Oral Daily Worthy Flank, NP   100 mg at 04/24/15 0858  . traMADol (ULTRAM) tablet  50 mg  50 mg Oral Q6H PRN Rachael Fee, MD   50 mg at 04/24/15 4098    Lab Results:  Results for orders placed or performed during the hospital encounter of 04/19/15 (from the past 48 hour(s))  Glucose, capillary     Status: Abnormal   Collection Time: 04/22/15  4:54 PM  Result  Value Ref Range   Glucose-Capillary 462 (H) 65 - 99 mg/dL  Glucose, capillary     Status: Abnormal   Collection Time: 04/22/15  8:45 PM  Result Value Ref Range   Glucose-Capillary 45 (L) 65 - 99 mg/dL  Glucose, capillary     Status: Abnormal   Collection Time: 04/22/15  9:03 PM  Result Value Ref Range   Glucose-Capillary 101 (H) 65 - 99 mg/dL  Glucose, capillary     Status: Abnormal   Collection Time: 04/23/15  5:55 AM  Result Value Ref Range   Glucose-Capillary 388 (H) 65 - 99 mg/dL  Glucose, capillary     Status: Abnormal   Collection Time: 04/23/15 11:56 AM  Result Value Ref Range   Glucose-Capillary 158 (H) 65 - 99 mg/dL  Glucose, capillary     Status: Abnormal   Collection Time: 04/23/15  4:43 PM  Result Value Ref Range   Glucose-Capillary 352 (H) 65 - 99 mg/dL  Glucose, capillary     Status: Abnormal   Collection Time: 04/23/15  8:01 PM  Result Value Ref Range   Glucose-Capillary 198 (H) 65 - 99 mg/dL  Glucose, capillary     Status: Abnormal   Collection Time: 04/24/15  6:17 AM  Result Value Ref Range   Glucose-Capillary 460 (H) 65 - 99 mg/dL    Physical Findings: AIMS: Facial and Oral Movements Muscles of Facial Expression: None, normal Lips and Perioral Area: None, normal Jaw: None, normal Tongue: None, normal,Extremity Movements Upper (arms, wrists, hands, fingers): None, normal Lower (legs, knees, ankles, toes): None, normal, Trunk Movements Neck, shoulders, hips: None, normal, Overall Severity Severity of abnormal movements (highest score from questions above): None, normal Incapacitation due to abnormal movements: None, normal Patient's awareness of abnormal movements (rate only  patient's report): No Awareness, Dental Status Current problems with teeth and/or dentures?: No Does patient usually wear dentures?: No  CIWA:  CIWA-Ar Total: 10 COWS:  COWS Total Score: 3  Musculoskeletal: Strength & Muscle Tone: within normal limits Gait & Station: normal Patient leans: normal  Psychiatric Specialty Exam: Review of Systems  Constitutional: Positive for weight loss and malaise/fatigue.  HENT: Negative.   Eyes: Negative.   Respiratory: Negative.   Cardiovascular: Negative.   Gastrointestinal: Negative.   Genitourinary: Negative.   Musculoskeletal:       Aches pains, legs pains  Skin: Negative.   Neurological: Negative.   Endo/Heme/Allergies: Negative.   Psychiatric/Behavioral: Positive for depression and substance abuse. The patient is nervous/anxious.     Blood pressure 112/78, pulse 108, temperature 97.9 F (36.6 C), temperature source Oral, resp. rate 16, height 5' 2.01" (1.575 m), weight 77.565 kg (171 lb), last menstrual period 03/25/2015.Body mass index is 31.27 kg/(m^2).  General Appearance: Fairly Groomed  Patent attorneyye Contact::  Fair  Speech:  Clear and Coherent  Volume:  Decreased  Mood:  Anxious, Depressed and worried  Affect:  anxious depressed worried  Thought Process:  Coherent and Goal Directed  Orientation:  Full (Time, Place, and Person)  Thought Content:  symptoms events worries concerns  Suicidal Thoughts:  No  Homicidal Thoughts:  No  Memory:  Immediate;   Fair Recent;   Fair Remote;   Fair  Judgement:  Fair  Insight:  Present and Shallow  Psychomotor Activity:  Restlessness  Concentration:  Fair  Recall:  FiservFair  Fund of Knowledge:Fair  Language: Fair  Akathisia:  No  Handed:  Right  AIMS (if indicated):     Assets:  Desire for Improvement  ADL's:  Intact  Cognition: WNL  Sleep:  Number of Hours: 4.25   Treatment Plan Summary: Daily contact with patient to assess and evaluate symptoms and progress in treatment and Medication  management Supportive approach/coping skills Alcohol dependence; continue to work a relapse prevention plan Depression; continue to work with the Prozac 60 mg daily Insomnia; continue to work with the Remeron 30 mg  Neuropathy; continue to work with the Neurontin 3600 daily Diabetes: continue to follow int med recommendations Continue to explore residential treatment options once medically stable Kennett Symes A 04/24/2015, 2:34 PM

## 2015-04-24 NOTE — Progress Notes (Signed)
Patient attend AA group meeting tonight.  

## 2015-04-24 NOTE — [facility] Group Notes (Signed)
[facility] LCSW Group Therapy  04/24/2015 3:23 PM  Type of Therapy:  Group Therapy  Participation Level:  Active  Participation Quality:  Attentive  Affect:  Appropriate  Cognitive:  Alert and Oriented  Insight:  Engaged  Engagement in Therapy:  Engaged  Modes of Intervention:  Confrontation, Discussion, Education, Exploration, Problem-solving, Rapport Building, Socialization and Support  Summary of Progress/Problems: Feelings around Relapse. Group members discussed the meaning of relapse and shared personal stories of relapse, how it affected them and others, and how they perceived themselves during this time. Group members were encouraged to identify triggers, warning signs and coping skills used when facing the possibility of relapse. Social supports were discussed and explored in detail. Post Acute Withdrawal Syndrome (handout provided) was introduced and examined. [name] were encouraged to ask questions, talk about key points associated with PAWS, and process this information in terms of relapse prevention. [name] was attentive and engaged during today's processing group. She was receptive to information provided and stated that she plans to share information with supportive friends and family. Carolyn Ayala shared that the recent death of her mother sparked her recent relapse and she wants to get sober in order to make her mother proud and get herself well.  Smart, Brenley Priore LCSW 04/24/2015, 3:23 PM

## 2015-04-24 NOTE — [facility] Group Notes (Signed)
[facility]   04/24/2015 10:42 AM  Participation Quality:  Invited. DID NOT ATTEND. Chose to remain in room.   Smart, Canna Nickelson LCSW

## 2015-04-24 NOTE — Progress Notes (Signed)
Inpatient Diabetes Program Recommendations  AACE/ADA: New Consensus Statement on Inpatient Glycemic Control (2015)  Target Ranges:  Prepandial:   less than 140 mg/dL      Peak postprandial:   less than 180 mg/dL (1-2 hours)      Critically ill patients:  140 - 180 mg/dL   Results for Carolyn Ayala, Carolyn Ayala (MRN [id number]) as of 04/24/2015 11:07  Ref. Range 04/23/2015 05:55 04/23/2015 11:56 04/23/2015 16:43 04/23/2015 20:01  Glucose-Capillary Latest Ref Range: 65-99 mg/dL 409388 (H) 811158 (H) 914352 (H) 198 (H)    Results for Carolyn Ayala, Carolyn Ayala (MRN [id number]) as of 04/24/2015 11:07  Ref. Range 04/24/2015 06:17  Glucose-Capillary Latest Ref Range: 65-99 mg/dL 086460 (H)    Admit with: Depression  History: DM, HTN, ETOH  Home DM Meds: Lantus 40 units AM/ 44 units PM  Humalog 3-15 units tid per SSI  Metformin 1000 mg bid  Current Insulin Orders: 70/30 insulin- 35 units bidwc  Novolog Moderate SSI (0-15 units) TID AC + HS  Metformin 1000 mg bid     -Received consult from RN stating that patient has newly diagnosed DM.  This is incorrect.  Patient has History of DM and was taking insulin prior to admission.  -Note CBGs are extremely labile.  Question if patient is eating food prior to her AM CBG being taken today??  -Note patient had Hypoglycemia on 11/09 X 2 events.  No Hypoglycemia noted since 11/09.     MD- Please consider the following in-hospital insulin adjustments:  Please increase 70/30 insulin to 45 units bid with meals (breakfast and supper)    --Will follow patient during hospitalization--  Ambrose FinlandJeannine Johnston Jaimere Feutz RN, MSN, CDE Diabetes Coordinator Inpatient Glycemic Control Team Team Pager: [phone] (8a-5p)

## 2015-04-24 NOTE — Tx Team (Signed)
Date:  04/24/2015  Time Reviewed:  8:36 AM   Progress in Treatment: Attending groups: Yes. Participating in groups:  Yes. Taking medication as prescribed:  Yes. Tolerating medication:  Yes. Family/Significant othe contact made:  SPE completed with pt, She refused to consent to family contact.  Patient understands diagnosis:  Yes. and As evidenced by:  seeking treatment for alcohol abuse, depression, medication stabilization, and for passive SI upon admisson.  Discussing patient identified problems/goals with staff:  Yes. Medical problems stabilized or resolved:  Yes. Denies suicidal/homicidal ideation: Yes. Issues/concerns per patient self-inventory:  Other:  Discharge Plan or Barriers: Pt interested in referral to [facility]-she is homeless in [place] currently. Pt also provided with hospice grief counseling info through [facility] (death of mother last month). Pt signed release for [facility] as well for o/p services. [facility] referral pending. [name] blood sugar is still high and [facility] will not accept her at this time.   Reason for Continuation of Hospitalization: Depression Medication stabilization Withdrawal symptoms  Comments:  Patient said that she is having thoughts of killing herself. Patient cites the death of her mother a month ago as the reason she is so depressed and wanting to kill herself. Patient lives by herself and is unclear about whether she is able to go back to her residence. She says "I haven't thought of that because I am going to kill myself." Patient has diabetes and is insulin dependent. She asked this clinician how much insulin she would need to overdose on to die in her sleep. Patient also wants to try to get hit by a car or get a toy gun and point it at a cop to commit "suicide by cop." Patient does not want to go in voluntarily to a psychiatry hospital Patient reports that she drinks five 40oz malt liquors daily for the last month. She drank before her mother  died but this has increased since mother died. Patient last drank on 05/10/2023 and reports some tingling on skin and some tremors in hands. Patient denies other drug use.Patient is a risk to try to harm herself if discharged. She needs inpatient care. Patient did go to [facility] in August of 2014. Has no current outpatient care.Diagnosis: Axis 1: Substance induced mood d/o; ETOH use d/o severe  Estimated length of stay:  3-5 days   New goal(s): to develop effective aftercare plan.   Additional Comments:  Patient and CSW reviewed pt's identified goals and treatment plan. Patient verbalized understanding and agreed to treatment plan. CSW reviewed [facility] "Discharge Process and Patient Involvement" Form. Pt verbalized understanding of information provided and signed form.    Review of initial/current patient goals per problem list:  1. Goal(s): Patient will participate in aftercare plan  Met: No.   Target date: at discharge  As evidenced by: Patient will participate within aftercare plan AEB aftercare provider and housing plan at discharge being identified.  11/7: CSW assessing for appropriate referrals. [facility] referral sent. [facility] for o/p med manamgent and counseling.   11/11: [facility] referral made- pt 's blood sugar too high at this time.   2. Goal (s): Patient will exhibit decreased depressive symptoms and suicidal ideations.  Met: No.    Target date: at discharge  As evidenced by: Patient will utilize self rating of depression at 3 or below and demonstrate decreased signs of depression or be deemed stable for discharge by MD.  11/7: Pt rates depression as 8/10 and presents with depression/flat affect. Denies SI/HI/AVH.   11/11: Pt rates  depression as 6/10. Goal progressing.   3. Goal(s): Patient will demonstrate decreased signs of withdrawal due to substance abuse  Met:No.   Target date:at discharge   As evidenced by: Patient will produce a CIWA/COWS score of 0, have stable vitals  signs, and no symptoms of withdrawal.  11/7: Pt reports mild withdrawals with CIWA score of 5 and high pulse.   11/11: Pt reports mild withdrawals with CIWA score of 4 and high pulse.    Attendees: Patient:   04/24/2015 10:42 AM   Family:   04/24/2015 10:42 AM   Physician:  Dr. Carlton Adam, MD 04/24/2015 10:42 AM   Nursing:   Nell Range RN 04/24/2015 10:42 AM   Clinical Social Worker: Maxie Better, [place]  04/24/2015 10:42 AM   Clinical Social Worker: Erasmo Downer Drinkard LCSWA; Peri Maris LCSWA 04/24/2015 10:42 AM   Other:  04/24/2015 10:42 AM   Other:  Lucinda Dell; [facility]  04/24/2015 10:42 AM   Other:   04/24/2015 10:42 AM   Other:  04/24/2015 10:42 AM   Other:  04/24/2015 10:42 AM   Other:  04/24/2015 10:42 AM    04/24/2015 10:42 AM    04/24/2015 10:42 AM    04/24/2015 10:42 AM    04/24/2015 10:42 AM    Scribe for Treatment Team:   Maxie Better, LCSW 04/24/2015 10:42 AM

## 2015-04-24 NOTE — Progress Notes (Signed)
RD Education Note   RD consulted for nutrition education regarding diabetes.   Lab Results  Component Value Date   HGBA1C 11.5* 04/19/2015    RD provided "Carbohydrate Counting for People with Diabetes" handout from the Academy of Nutrition and Dietetics. Discussed different food groups and their effects on blood sugar, emphasizing carbohydrate-containing foods. Provided list of carbohydrates and recommended serving sizes of common foods.  Discussed importance of controlled and consistent carbohydrate intake throughout the day. Provided examples of ways to balance meals/snacks and encouraged intake of high-fiber, whole grain complex carbohydrates. Teach back method used.  Spoke with pt briefly but she stated she was about to take her insulin and was not interested in speaking with me. Provided her with "MyPlate for Diabetes" handout, and briefly went through making half your plate vegetables, 1/4 Protein, and 1/4 starch.    Expect poor compliance.  Body mass index is 31.27 kg/(m^2). Pt meets criteria for obese based on current BMI.  Current diet order is carb modified, patient is consuming approximately unknown % of meals at this time. Labs and medications reviewed. No further nutrition interventions warranted at this time. RD contact information provided. If additional nutrition issues arise, please re-consult RD.  Carolyn AnoWilliam M. Kerrington Sova, MS, RD LDN After Hours/Weekend Pager [phone]

## 2015-04-24 NOTE — Progress Notes (Signed)
Pt attended karaoke group this evening.  

## 2015-04-24 NOTE — Progress Notes (Signed)
Recreation Therapy Notes  Date: 04/24/15 Time: 930 Location: 300 Group Room  Group Topic: Stress Management  Goal Area(s) Addresses:  Patient will verbalize importance of using healthy stress management.  Patient will identify positive emotions associated with healthy stress management.   Intervention: Stress Management  Activity :  Guided Imagery.  LRT introduced the concept of guided imagery to the patients.  Patients were asked to follow along as LRT read the script to engage in the activity of guided imagery.  Education:  Stress Management, Discharge Planning.   Education Outcome: Acknowledges edcuation/In group clarification offered/Needs additional education  Clinical Observations/Feedback: Patient did not attend group.   Carolyn Ayala, LRT/CTRS         Carolyn Ayala 04/24/2015 1:46 PM 

## 2015-04-24 NOTE — Progress Notes (Signed)
D-pt c/o high depression and anxiety, pt sts her vision is blurry & headache A-pt took her medications, educated pt on her newly dx diabetes, gave her a book on living with diabetes and ordered a diabetic educator consult, checking blood sugars ac/hs R-cont to monitor for safety

## 2015-04-25 LAB — GLUCOSE, CAPILLARY
Glucose-Capillary: 435 mg/dL — ABNORMAL HIGH (ref 65–99)
Glucose-Capillary: 231 mg/dL — ABNORMAL HIGH (ref 65–99)
Glucose-Capillary: 286 mg/dL — ABNORMAL HIGH (ref 65–99)
Glucose-Capillary: 177 mg/dL — ABNORMAL HIGH (ref 65–99)
Glucose-Capillary: 446 mg/dL — ABNORMAL HIGH (ref 65–99)
Glucose-Capillary: 414 mg/dL — ABNORMAL HIGH (ref 65–99)
Glucose-Capillary: 102 mg/dL — ABNORMAL HIGH (ref 65–99)
Glucose-Capillary: 439 mg/dL — ABNORMAL HIGH (ref 65–99)

## 2015-04-25 MED ORDER — INSULIN ASPART 100 UNIT/ML ~~LOC~~ SOLN
20.0000 [IU] | Freq: Once | SUBCUTANEOUS | Status: AC
  Administered 2015-04-25: 20 [IU] via SUBCUTANEOUS

## 2015-04-25 MED ORDER — BENZOCAINE 10 % MT GEL: Freq: Three times a day (TID) | OROMUCOSAL | Status: DC | PRN

## 2015-04-25 NOTE — Progress Notes (Signed)
Psychoeducational Group Note  Date:  04/25/2015 Time: 2100 Group Topic/Focus:  wrap up group  Participation Level: Did Not Attend  Participation Quality:  Not Applicable  Affect:  Not Applicable  Cognitive:  Not Applicable  Insight:  Not Applicable  Engagement in Group: Not Applicable  Additional Comments:  Pt was sleeping and was not woken up for group.   Shelah LewandowskySquires, Shareece Bultman Carol 04/25/2015, 10:57 PM

## 2015-04-25 NOTE — Progress Notes (Signed)
D.  Pt pleasant but shakey on approach.  Pt did not feel able to attend evening wrap up group, blood sugar checked and it was 64.  NP notified and juice/snacks given to patient.  Pt complained of a headache and given appropriate medication for this.  Pt re-checked and blood sugar 136.  Pt denies SI/HI/hallucinations at this time.  Interacting appropriately with peers on the unit.  A.  Support and encouragement offered, medications given as ordered.  R.  Pt remains safe on the unit, will continue to monitor.

## 2015-04-25 NOTE — [facility] Group Notes (Signed)
[facility] Group Notes:  (Clinical Social Work)   04/11/2015     10:00-11:00AM  Summary of Progress/Problems:   In today's process group a decisional balance exercise was used to explore in depth the perceived benefits and costs of alcohol and drugs, as well as the  benefits and costs of replacing these with healthy coping skills.  Patients listed healthy and unhealthy coping techniques, determining with CSW guidance that unhealthy coping techniques work initially, but eventually become harmful.  Motivational Interviewing and the whiteboard were utilized for the exercises.  The patient expressed that the group was very helpful to her.  Type of Therapy:  Group Therapy - Process   Participation Level:  Active  Participation Quality:  Attentive, Sharing and Supportive  Affect:  Depressed and Tearful  Cognitive:  Alert, Appropriate and Oriented  Insight:  Engaged  Engagement in Therapy:  Engaged  Modes of Intervention:  Education, Motivational Interviewing  Ambrose MantleMareida Grossman-Orr, LCSW 04/25/2015, 12:41 PM

## 2015-04-25 NOTE — Progress Notes (Signed)
Before dinner CBG collected, 439 mg/dl . Hillery Jacksanika Lewis FNP notified. Give 20 units Novolog Insulin x 1 order to cover CBG 439 mg/dl ordered. Continue with Novolog 70/30 order. Will continue to monitor.

## 2015-04-25 NOTE — Plan of Care (Signed)
Problem: Diagnosis: Increased Risk For Suicide Attempt Goal: STG-Patient Will Comply With Medication Regime Outcome: Progressing Pt has been compliant with medication

## 2015-04-25 NOTE — Progress Notes (Signed)
[facility] Progress Note  04/25/2015 12:50 PM Jacklynn Ganongomekia Denise Weatherbee  MRN:  [id number]   Subjective:  "not feeling myself due to high blood sugars"  Objective:Clarise is awake and alert, denies suicidal or homicidal ideation. Patient is attending group sessions.Reports learning new coping skills and that "you don't need a pill for all of life's problems."    patient has concerns regarding glucose control and discharge. Reports not feeling her self today due to "high blood sugar this morning of 400." states she monitoring her intake and denies mediation side effects. Reports is hopeful to be accepted into a long term treatment facility and will due all that she can to stay on top of her blood sugar. Support, encouragement and reassurance was provided.    Principal Problem: MDD (major depressive disorder), recurrent severe, without psychosis (HCC) Diagnosis:   Patient Active Problem List   Diagnosis Date Noted  . MDD (major depressive disorder), recurrent severe, without psychosis (HCC) [F33.2] 04/19/2015    Priority: High  . IDDM (insulin dependent diabetes mellitus) (HCC) [E11.9, Z79.4] 02/08/2013    Priority: Medium  . Uncontrolled diabetes mellitus with diabetic neuropathy, with long-term current use of insulin (HCC) [E11.40, Z79.4, E11.65] 04/21/2015  . PTSD (post-traumatic stress disorder) [F43.10] 04/20/2015  . Alcohol dependence with withdrawal, uncomplicated (HCC) [F10.230] 04/20/2015  . Suicidal ideation [R45.851] 04/19/2015  . Recent bereavement [Z78.9] 04/19/2015  . Generalized anxiety disorder [F41.1] 02/13/2013  . Chronic posttraumatic stress disorder [F43.12] 02/13/2013  . Alcoholism with alcohol dependence (HCC) [F10.20] 02/08/2013  . Substance induced mood disorder (HCC) [F19.94] 02/08/2013   Total Time spent with patient: 30 minutes  Past Psychiatric History: see Admission H and P  Past Medical History:  Past Medical History  Diagnosis Date  . Mental disorder   .  Depression   . Diabetes mellitus without complication (HCC)   . Hypertension     Past Surgical History  Procedure Laterality Date  . Cesarean section     Family History:  Family History  Problem Relation Age of Onset  . Depression Mother    Family Psychiatric  History: see Admission H and P Social History:  History  Alcohol Use  . Yes    Comment: daily     History  Drug Use No    Social History   Social History  . Marital Status: Single    Spouse Name: N/A  . Number of Children: N/A  . Years of Education: N/A   Social History Main Topics  . Smoking status: Current Every Day Smoker -- 1.00 packs/day    Types: Cigarettes  . Smokeless tobacco: Current User  . Alcohol Use: Yes     Comment: daily  . Drug Use: No  . Sexual Activity: No   Other Topics Concern  . None   Social History Narrative   ** Merged History Encounter **       Additional Social History:    Pain Medications: flexerilfor leg neuropathy 2 days ago Prescriptions: Insulin Levamir 60 units 2/D, Novalog befoe each meal sliding scale, Lisinopril 10mg  once daily, Gabapention 3x/D prn History of alcohol / drug use?: Yes Name of Substance 1: ETOH 1 - Age of First Use: 39 years of age 34 - Amount (size/oz): 2 40 oz beers daily 1 - Frequency: daily x 1 month 1 - Duration: since mother died a month ago 1 - Last Use / Amount: 2 days ago          Sleep: Fair  Appetite:  Fair  Current Medications: Current Facility-Administered Medications  Medication Dose Route Frequency Provider Last Rate Last Dose  . acetaminophen (TYLENOL) tablet 650 mg  650 mg Oral Q6H PRN Oneta Rack, NP   650 mg at 04/21/15 0804  . alum & mag hydroxide-simeth (MAALOX/MYLANTA) 200-200-20 MG/5ML suspension 30 mL  30 mL Oral Q4H PRN Oneta Rack, NP      . benzocaine (ORAJEL) 10 % mucosal gel   Mouth/Throat TID PRN Oneta Rack, NP      . carisoprodol (SOMA) tablet 350 mg  350 mg Oral QID PRN Rachael Fee, MD   350 mg  at 04/25/15 5621  . feeding supplement (GLUCERNA SHAKE) (GLUCERNA SHAKE) liquid 237 mL  237 mL Oral BID BM Anderson [place], RD   237 mL at 04/25/15 1113  . FLUoxetine (PROZAC) capsule 60 mg  60 mg Oral Daily Rachael Fee, MD   60 mg at 04/25/15 0803  . gabapentin (NEURONTIN) capsule 1,000 mg  1,000 mg Oral TID Rachael Fee, MD   1,000 mg at 04/25/15 1114  . gabapentin (NEURONTIN) capsule 600 mg  600 mg Oral QHS Rachael Fee, MD   600 mg at 04/24/15 2134  . hydrOXYzine (ATARAX/VISTARIL) tablet 50 mg  50 mg Oral Q4H PRN Rachael Fee, MD   50 mg at 04/25/15 1115  . ibuprofen (ADVIL,MOTRIN) tablet 800 mg  800 mg Oral Q6H PRN Rachael Fee, MD   800 mg at 04/24/15 1522  . Influenza vac split quadrivalent PF (FLUARIX) injection 0.5 mL  0.5 mL Intramuscular Tomorrow-1000 Beau Fanny, FNP   0.5 mL at 04/20/15 1346  . insulin aspart (novoLOG) injection 0-15 Units  0-15 Units Subcutaneous TID WC Oneta Rack, NP   15 Units at 04/25/15 0617  . insulin aspart (novoLOG) injection 0-5 Units  0-5 Units Subcutaneous QHS Oneta Rack, NP   3 Units at 04/19/15 2143  . insulin aspart protamine- aspart (NOVOLOG MIX 70/30) injection 35 Units  35 Units Subcutaneous BID WC Dorothea Ogle, MD   35 Units at 04/25/15 [phone]  . lisinopril (PRINIVIL,ZESTRIL) tablet 5 mg  5 mg Oral Daily Oneta Rack, NP   5 mg at 04/25/15 0803  . loperamide (IMODIUM) capsule 4 mg  4 mg Oral PRN Rachael Fee, MD   4 mg at 04/24/15 0904  . loratadine (CLARITIN) tablet 10 mg  10 mg Oral q morning - 10a Oneta Rack, NP   10 mg at 04/25/15 1114  . magnesium hydroxide (MILK OF MAGNESIA) suspension 30 mL  30 mL Oral Daily PRN Oneta Rack, NP      . metFORMIN (GLUCOPHAGE) tablet 1,000 mg  1,000 mg Oral BID WC Rachael Fee, MD   1,000 mg at 04/25/15 0803  . mirtazapine (REMERON) tablet 30 mg  30 mg Oral QHS Oneta Rack, NP   30 mg at 04/19/15 2200  . multivitamin with minerals tablet 1 tablet  1 tablet Oral Daily Worthy Flank,  NP   1 tablet at 04/25/15 0803  . pneumococcal 23 valent vaccine (PNU-IMMUNE) injection 0.5 mL  0.5 mL Intramuscular Tomorrow-1000 Beau Fanny, FNP   0.5 mL at 04/20/15 1346  . thiamine (B-1) injection 100 mg  100 mg Intramuscular Once Worthy Flank, NP   100 mg at 04/19/15 2145  . thiamine (VITAMIN B-1) tablet 100 mg  100 mg Oral Daily Worthy Flank, NP   100  mg at 04/25/15 0803  . traMADol (ULTRAM) tablet 50 mg  50 mg Oral Q6H PRN Rachael Fee, MD   50 mg at 04/25/15 1610    Lab Results:  Results for orders placed or performed during the hospital encounter of 04/19/15 (from the past 48 hour(s))  Glucose, capillary     Status: Abnormal   Collection Time: 04/23/15  4:43 PM  Result Value Ref Range   Glucose-Capillary 352 (H) 65 - 99 mg/dL  Glucose, capillary     Status: Abnormal   Collection Time: 04/23/15  8:01 PM  Result Value Ref Range   Glucose-Capillary 198 (H) 65 - 99 mg/dL  Glucose, capillary     Status: Abnormal   Collection Time: 04/24/15  6:17 AM  Result Value Ref Range   Glucose-Capillary 460 (H) 65 - 99 mg/dL  Glucose, capillary     Status: Abnormal   Collection Time: 04/24/15  7:25 AM  Result Value Ref Range   Glucose-Capillary 435 (H) 65 - 99 mg/dL   Comment 1 Notify RN   Glucose, capillary     Status: Abnormal   Collection Time: 04/24/15 12:00 PM  Result Value Ref Range   Glucose-Capillary 231 (H) 65 - 99 mg/dL  Glucose, capillary     Status: Abnormal   Collection Time: 04/24/15  5:12 PM  Result Value Ref Range   Glucose-Capillary 286 (H) 65 - 99 mg/dL  Glucose, capillary     Status: Abnormal   Collection Time: 04/24/15  9:15 PM  Result Value Ref Range   Glucose-Capillary 177 (H) 65 - 99 mg/dL  Glucose, capillary     Status: Abnormal   Collection Time: 04/25/15  6:09 AM  Result Value Ref Range   Glucose-Capillary 446 (H) 65 - 99 mg/dL  Glucose, capillary     Status: Abnormal   Collection Time: 04/25/15  6:48 AM  Result Value Ref Range    Glucose-Capillary 414 (H) 65 - 99 mg/dL  Glucose, capillary     Status: Abnormal   Collection Time: 04/25/15 11:57 AM  Result Value Ref Range   Glucose-Capillary 102 (H) 65 - 99 mg/dL    Physical Findings: AIMS: Facial and Oral Movements Muscles of Facial Expression: None, normal Lips and Perioral Area: None, normal Jaw: None, normal Tongue: None, normal,Extremity Movements Upper (arms, wrists, hands, fingers): None, normal Lower (legs, knees, ankles, toes): None, normal, Trunk Movements Neck, shoulders, hips: None, normal, Overall Severity Severity of abnormal movements (highest score from questions above): None, normal Incapacitation due to abnormal movements: None, normal Patient's awareness of abnormal movements (rate only patient's report): No Awareness, Dental Status Current problems with teeth and/or dentures?: No Does patient usually wear dentures?: No  CIWA:  CIWA-Ar Total: 2 COWS:  COWS Total Score: 3  Musculoskeletal: Strength & Muscle Tone: within normal limits Gait & Station: normal Patient leans: normal  Psychiatric Specialty Exam: Review of Systems  Constitutional: Positive for malaise/fatigue.  HENT: Negative.   Eyes: Negative.  Negative for blurred vision.  Respiratory: Negative.   Cardiovascular: Negative.   Gastrointestinal: Negative.   Genitourinary: Negative.   Musculoskeletal: Positive for joint pain.       Aches pains, legs pains  Skin: Negative.   Neurological: Negative.  Negative for headaches.  Endo/Heme/Allergies: Negative.   Psychiatric/Behavioral: Positive for depression and substance abuse. The patient is nervous/anxious.     Blood pressure 119/78, pulse 112, temperature 98 F (36.7 C), temperature source Oral, resp. rate 16, height 5' 2.01" (1.575 m), weight  77.565 kg (171 lb), last menstrual period 03/25/2015.Body mass index is 31.27 kg/(m^2).  General Appearance: Casual and Guarded  Eye Contact::  Good  Speech:  Clear and Coherent   Volume:  Normal  Mood:  Anxious, Depressed and worried  Affect:  anxious depressed worried  Thought Process:  Coherent and Goal Directed  Orientation:  Full (Time, Place, and Person)  Thought Content:  Hallucinations: None and symptoms events worries concerns  Suicidal Thoughts:  No  Homicidal Thoughts:  No  Memory:  Immediate;   Fair Recent;   Fair Remote;   Fair  Judgement:  Fair  Insight:  Lacking and Present  Psychomotor Activity:  Normal  Concentration:  Fair  Recall:  [employer] of Knowledge:Fair  Language: Fair  Akathisia:  No  Handed:  Right  AIMS (if indicated):     Assets:  Desire for Improvement Social Support Vocational/Educational  ADL's:  Intact  Cognition: WNL  Sleep:  Number of Hours: 4.25    I agree with current treatment plan 04/25/2015  Treatment Plan Summary: Daily contact with patient to assess and evaluate symptoms and progress in treatment and Medication management Supportive approach/coping skills -Patient will log food intake./ calories diary   Alcohol dependence; continue to work a relapse prevention plan Depression; continue to work with the Prozac 60 mg daily Insomnia; continue to work with the Remeron 30 mg  Neuropathy; continue to work with the Neurontin 3600 daily Diabetes: continue to follow int med recommendations Continue to explore residential treatment options once medically stable  Oneta Rack FNP- [facility] 04/25/2015, 12:50 PM

## 2015-04-25 NOTE — Progress Notes (Addendum)
D: Per patient self inventory form pt reports she slept fair last night. She reports a fair appetite, low energy level, poor concentration. Pt rates depression 9/10, hopelessness 9/10, anxiety 10/10- all on 0-10 scale, 10 being the worse. Pt endorses passive SI. Denies HI. Pt reports she feels safe and that she has no plan to hurt herself. Pt c/o generalizes pain. Pt reports her goal for the day is "trying to get my diabetes under control" and that she will "eat small portions of meals" to help meet her goal. Pt attending nursing group on the unit. Noted socializing with peers on the unit. Behavior pleasant on approach.  A:Special checks q 15 mins in place for safety. Medication administerd per MD order (see eMAR). Encouragement and support provided. Education provided in regards to healthy food choices at lunch time.  R:Safety maintained. Compliant with medication regimen. Pt verbally contracts for safety. Will continue to monitor.

## 2015-04-25 NOTE — Progress Notes (Signed)
Patient ID: Carolyn Ayala, female   DOB: 02-Dec-1975, 39 y.o.   MRN: [id number] D: Patient in dayroom on approach. Pt mood and affect appeared depressed and flat. Pt reports plans to follow up with grieve counseling. Pt reports she is tolerating medication well. Pt denies SI/HI/AVH. Pt attended and participated in evening wrap up group. Cooperative with assessment.   A: Met with pt 1:1. Medications administered as prescribed. Support and encouragement provided to engage in milieu. Pt encouraged to discuss feelings and come to staff with any question or concerns.   R: Patient remains safe and complaint with medications.

## 2015-04-25 NOTE — [facility] Group Notes (Signed)
[facility] Group Notes:  (Nursing/MHT/Case Management/Adjunct)  Date:  04/25/2015  Time: 0930  Type of Therapy:  Nurse Education  Participation Level:  Minimal  Participation Quality:  Appropriate  Affect:  Depressed  Cognitive:  Alert  Insight:  Improving  Engagement in Group:  Improving  Modes of Intervention:  Activity, Clarification, Socialization and Support  Summary of Progress/Problems:  Dara Hoyershley N Kathey Simer 04/25/2015, 12:57 PM

## 2015-04-26 LAB — GLUCOSE, CAPILLARY
Glucose-Capillary: 64 mg/dL — ABNORMAL LOW (ref 65–99)
Glucose-Capillary: 136 mg/dL — ABNORMAL HIGH (ref 65–99)
Glucose-Capillary: 528 mg/dL — ABNORMAL HIGH (ref 65–99)
Glucose-Capillary: 226 mg/dL — ABNORMAL HIGH (ref 65–99)
Glucose-Capillary: 363 mg/dL — ABNORMAL HIGH (ref 65–99)
Glucose-Capillary: 204 mg/dL — ABNORMAL HIGH (ref 65–99)

## 2015-04-26 MED ORDER — INSULIN ASPART 100 UNIT/ML ~~LOC~~ SOLN
20.0000 [IU] | Freq: Once | SUBCUTANEOUS | Status: AC
  Administered 2015-04-26: 20 [IU] via SUBCUTANEOUS

## 2015-04-26 MED ORDER — INSULIN ASPART PROT & ASPART (70-30 MIX) 100 UNIT/ML ~~LOC~~ SUSP
45.0000 [IU] | Freq: Two times a day (BID) | SUBCUTANEOUS | Status: DC
  Administered 2015-04-26 – 2015-04-27 (×3): 45 [IU] via SUBCUTANEOUS

## 2015-04-26 NOTE — [facility] Group Notes (Signed)
[facility] Group Notes: (Clinical Social Work)   04/26/2015      Type of Therapy:  Group Therapy   Participation Level:  Did Not Attend despite MHT prompting   Carolyn MantleMareida Grossman-Orr, LCSW 04/26/2015, 12:32 PM

## 2015-04-26 NOTE — Progress Notes (Signed)
[name] blood sugar 528 this AM, NP notified and order received for 20 units Novolog.  See new orders written by NP.  Pt pleasant, no s/s of distress, sitting in dayroom with peers waiting to go to breakfast.

## 2015-04-26 NOTE — Progress Notes (Signed)
[facility] Progress Note  04/26/2015 1:39 PM Carolyn Ayala  MRN:  [id number]   Subjective:  "I have a lot of issues today, I just want to jump off a cliff"  Objective:Carolyn Ayala is awake and alert, attending group sessions. State she would like to discuss a lot of issues that she has been dealing with. Report I just want to jump off a cliff because of all that is going on with me. Sates she feels like the RN does not like her and is apart of the "[facility]". States she does not feel safe with current RN staff "cause she has to give me insuline injections". Reports they don't know what they are doing with my blood sugars because they are all over the place. Reports eating Breakfast, Lunch and Dinner with several snack in between however is not her fault that her level are elevated it is because the staff's keep giving more and more. Reports that her roommate is doing things to make her think she is going crazy. Patient denies homicidal ideation. Denies auditory or visual hallucination. Patient still has rumination regarding glucose control and discharge.  states she monitoring her intake and denies mediation side effects. Patient is still hopeful to be accepted into a long term treatment facility even with uncontrolled  blood sugar. Support, encouragement and reassurance was provided.    Principal Problem: MDD (major depressive disorder), recurrent severe, without psychosis (HCC) Diagnosis:   Patient Active Problem List   Diagnosis Date Noted  . MDD (major depressive disorder), recurrent severe, without psychosis (HCC) [F33.2] 04/19/2015    Priority: High  . IDDM (insulin dependent diabetes mellitus) (HCC) [E11.9, Z79.4] 02/08/2013    Priority: Medium  . Uncontrolled diabetes mellitus with diabetic neuropathy, with long-term current use of insulin (HCC) [E11.40, Z79.4, E11.65] 04/21/2015  . PTSD (post-traumatic stress disorder) [F43.10] 04/20/2015  . Alcohol dependence with withdrawal, uncomplicated  (HCC) [F10.230] 04/20/2015  . Suicidal ideation [R45.851] 04/19/2015  . Recent bereavement [Z78.9] 04/19/2015  . Generalized anxiety disorder [F41.1] 02/13/2013  . Chronic posttraumatic stress disorder [F43.12] 02/13/2013  . Alcoholism with alcohol dependence (HCC) [F10.20] 02/08/2013  . Substance induced mood disorder (HCC) [F19.94] 02/08/2013   Total Time spent with patient: 30 minutes  Past Psychiatric History: see Admission H and P  Past Medical History:  Past Medical History  Diagnosis Date  . Mental disorder   . Depression   . Diabetes mellitus without complication (HCC)   . Hypertension     Past Surgical History  Procedure Laterality Date  . Cesarean section     Family History:  Family History  Problem Relation Age of Onset  . Depression Mother    Family Psychiatric  History: see Admission H and P Social History:  History  Alcohol Use  . Yes    Comment: daily     History  Drug Use No    Social History   Social History  . Marital Status: Single    Spouse Name: N/A  . Number of Children: N/A  . Years of Education: N/A   Social History Main Topics  . Smoking status: Current Every Day Smoker -- 1.00 packs/day    Types: Cigarettes  . Smokeless tobacco: Current User  . Alcohol Use: Yes     Comment: daily  . Drug Use: No  . Sexual Activity: No   Other Topics Concern  . None   Social History Narrative   ** Merged History Encounter **       Additional  Social History:    Pain Medications: flexerilfor leg neuropathy 2 days ago Prescriptions: Insulin Levamir 60 units 2/D, Novalog befoe each meal sliding scale, Lisinopril  once daily, Gabapention 3x/D prn History of alcohol / drug use?: Yes Name of Substance 1: ETOH 1 - Age of First Use: 39 years of age 70 - Amount (size/oz): 2 40 oz beers daily 1 - Frequency: daily x 1 month 1 - Duration: since mother died a month ago 1 - Last Use / Amount: 2 days ago          Sleep: Fair  Appetite:   Fair  Current Medications: Current Facility-Administered Medications  Medication Dose Route Frequency Provider Last Rate Last Dose  . acetaminophen (TYLENOL) tablet 650 mg  650 mg Oral Q6H PRN Oneta Rack, NP   650 mg at 04/21/15 0804  . alum & mag hydroxide-simeth (MAALOX/MYLANTA) 200-200-20 MG/5ML suspension 30 mL  30 mL Oral Q4H PRN Oneta Rack, NP      . benzocaine (ORAJEL) 10 % mucosal gel   Mouth/Throat TID PRN Oneta Rack, NP      . carisoprodol (SOMA) tablet 350 mg  350 mg Oral QID PRN Rachael Fee, MD   350 mg at 04/26/15 1127  . feeding supplement (GLUCERNA SHAKE) (GLUCERNA SHAKE) liquid 237 mL  237 mL Oral BID BM Anderson Malta [place], RD   237 mL at 04/25/15 1358  . FLUoxetine (PROZAC) capsule 60 mg  60 mg Oral Daily Rachael Fee, MD   60 mg at 04/26/15 0753  . gabapentin (NEURONTIN) capsule 1,000 mg  1,000 mg Oral TID Rachael Fee, MD   1,000 mg at 04/26/15 1122  . gabapentin (NEURONTIN) capsule 600 mg  600 mg Oral QHS Rachael Fee, MD   600 mg at 04/25/15 2203  . hydrOXYzine (ATARAX/VISTARIL) tablet 50 mg  50 mg Oral Q4H PRN Rachael Fee, MD   50 mg at 04/26/15 1124  . ibuprofen (ADVIL,MOTRIN) tablet 800 mg  800 mg Oral Q6H PRN Rachael Fee, MD   800 mg at 04/24/15 1522  . Influenza vac split quadrivalent PF (FLUARIX) injection 0.5 mL  0.5 mL Intramuscular Tomorrow-1000 Beau Fanny, FNP   0.5 mL at 04/20/15 1346  . insulin aspart (novoLOG) injection 0-15 Units  0-15 Units Subcutaneous TID WC Oneta Rack, NP   5 Units at 04/26/15 1209  . insulin aspart (novoLOG) injection 0-5 Units  0-5 Units Subcutaneous QHS Oneta Rack, NP   3 Units at 04/19/15 2143  . insulin aspart protamine- aspart (NOVOLOG MIX 70/30) injection 45 Units  45 Units Subcutaneous BID WC Kristeen Mans, NP   45 Units at 04/26/15 0759  . lisinopril (PRINIVIL,ZESTRIL) tablet 5 mg  5 mg Oral Daily Oneta Rack, NP   5 mg at 04/26/15 0754  . loperamide (IMODIUM) capsule 4 mg  4 mg Oral PRN Rachael Fee, MD   4 mg at 04/25/15 1400  . loratadine (CLARITIN) tablet 10 mg  10 mg Oral q morning - 10a Oneta Rack, NP   10 mg at 04/26/15 0755  . magnesium hydroxide (MILK OF MAGNESIA) suspension 30 mL  30 mL Oral Daily PRN Oneta Rack, NP      . metFORMIN (GLUCOPHAGE) tablet 1,000 mg  1,000 mg Oral BID WC Rachael Fee, MD   1,000 mg at 04/26/15 0753  . mirtazapine (REMERON) tablet 30 mg  30 mg Oral QHS Jaquitta Dupriest N  Kaena Santori, NP   30 mg at 04/19/15 2200  . multivitamin with minerals tablet 1 tablet  1 tablet Oral Daily Worthy Flank, NP   1 tablet at 04/26/15 0753  . pneumococcal 23 valent vaccine (PNU-IMMUNE) injection 0.5 mL  0.5 mL Intramuscular Tomorrow-1000 Beau Fanny, FNP   0.5 mL at 04/20/15 1346  . thiamine (B-1) injection 100 mg  100 mg Intramuscular Once Worthy Flank, NP   100 mg at 04/19/15 2145  . thiamine (VITAMIN B-1) tablet 100 mg  100 mg Oral Daily Worthy Flank, NP   100 mg at 04/26/15 0753  . traMADol (ULTRAM) tablet 50 mg  50 mg Oral Q6H PRN Rachael Fee, MD   50 mg at 04/26/15 4098    Lab Results:  Results for orders placed or performed during the hospital encounter of 04/19/15 (from the past 48 hour(s))  Glucose, capillary     Status: Abnormal   Collection Time: 04/24/15  5:12 PM  Result Value Ref Range   Glucose-Capillary 286 (H) 65 - 99 mg/dL  Glucose, capillary     Status: Abnormal   Collection Time: 04/24/15  9:15 PM  Result Value Ref Range   Glucose-Capillary 177 (H) 65 - 99 mg/dL  Glucose, capillary     Status: Abnormal   Collection Time: 04/25/15  6:09 AM  Result Value Ref Range   Glucose-Capillary 446 (H) 65 - 99 mg/dL  Glucose, capillary     Status: Abnormal   Collection Time: 04/25/15  6:48 AM  Result Value Ref Range   Glucose-Capillary 414 (H) 65 - 99 mg/dL  Glucose, capillary     Status: Abnormal   Collection Time: 04/25/15 11:57 AM  Result Value Ref Range   Glucose-Capillary 102 (H) 65 - 99 mg/dL  Glucose, capillary     Status: Abnormal    Collection Time: 04/25/15  4:55 PM  Result Value Ref Range   Glucose-Capillary 439 (H) 65 - 99 mg/dL  Glucose, capillary     Status: Abnormal   Collection Time: 04/25/15  9:00 PM  Result Value Ref Range   Glucose-Capillary 64 (L) 65 - 99 mg/dL   Comment 1 Notify RN    Comment 2 Document in Chart   Glucose, capillary     Status: Abnormal   Collection Time: 04/25/15  9:36 PM  Result Value Ref Range   Glucose-Capillary 136 (H) 65 - 99 mg/dL   Comment 1 Document in Chart    Comment 2 Repeat Test   Glucose, capillary     Status: Abnormal   Collection Time: 04/26/15  6:20 AM  Result Value Ref Range   Glucose-Capillary 528 (H) 65 - 99 mg/dL   Comment 1 Notify RN    Comment 2 Document in Chart     Physical Findings: AIMS: Facial and Oral Movements Muscles of Facial Expression: None, normal Lips and Perioral Area: None, normal Jaw: None, normal Tongue: None, normal,Extremity Movements Upper (arms, wrists, hands, fingers): None, normal Lower (legs, knees, ankles, toes): None, normal, Trunk Movements Neck, shoulders, hips: None, normal, Overall Severity Severity of abnormal movements (highest score from questions above): None, normal Incapacitation due to abnormal movements: None, normal Patient's awareness of abnormal movements (rate only patient's report): No Awareness, Dental Status Current problems with teeth and/or dentures?: No Does patient usually wear dentures?: No  CIWA:  CIWA-Ar Total: 2 COWS:  COWS Total Score: 3  Musculoskeletal: Strength & Muscle Tone: within normal limits Gait & Station: normal Patient leans:  normal  Psychiatric Specialty Exam: Review of Systems  Constitutional: Malaise/fatigue: deneis fatigue.  HENT: Negative.   Eyes: Negative.   Respiratory: Negative.   Cardiovascular: Negative.   Gastrointestinal: Negative.   Genitourinary: Negative.   Musculoskeletal: Positive for joint pain (denies joint aches/or pain today).       Aches pains, legs  pains  Skin: Negative.   Neurological: Negative.  Negative for headaches.  Endo/Heme/Allergies: Negative.   Psychiatric/Behavioral: Positive for depression, suicidal ideas and substance abuse. The patient is nervous/anxious.     Blood pressure 123/82, pulse 99, temperature 97.7 F (36.5 C), temperature source Oral, resp. rate 20, height 5' 2.01" (1.575 m), weight 77.565 kg (171 lb), last menstrual period 03/25/2015.Body mass index is 31.27 kg/(m^2).  General Appearance: Casual and Guarded  Eye Contact::  Good  Speech:  Normal Rate  Volume:  Normal  Mood:  Anxious, Depressed and worried  Affect:  anxious depressed worried Paranoid ideations that staff and others are after her.  Thought Process:  Intact  Orientation:  Full (Time, Place, and Person)  Thought Content:  Hallucinations: None and symptoms events worries concerns  Suicidal Thoughts:  No  Homicidal Thoughts:  No  Memory:  Immediate;   Fair Recent;   Fair Remote;   Fair  Judgement:  Fair  Insight:  Lacking and Present  Psychomotor Activity:  Normal  Concentration:  Fair  Recall:  [employer] of Knowledge:Fair  Language: Fair  Akathisia:  No  Handed:  Right  AIMS (if indicated):     Assets:  Desire for Improvement Social Support Vocational/Educational  ADL's:  Intact  Cognition: WNL  Sleep:  Number of Hours: 2.5    I agree with current treatment plan 04/26/2015  Treatment Plan Summary: Daily contact with patient to assess and evaluate symptoms and progress in treatment and Medication management Supportive approach/coping skills -Patient will log food intake./ calories diary  -Charge RN/AC is considering room/hall change(500) Alcohol dependence; continue to work a relapse prevention plan Depression; continue to work with the Prozac 60 mg daily Insomnia; continue to work with the Remeron 30 mg  Neuropathy; continue to work with the Neurontin 3600 daily Diabetes: continue to follow int med recommendations Continue  to explore residential treatment options once medically stable  Oneta Rack FNP- [facility] 04/26/2015, 1:39 PM

## 2015-04-26 NOTE — Progress Notes (Signed)
Dar Note:  Patient ate 100% of her meals with staff encouragement.  Received sliding scale and scheduled insulins as prescribed.  T. Melvyn NethLewis, NP made aware.

## 2015-04-26 NOTE — Progress Notes (Signed)
1:1 Note: Patient maintained on constant supervision for safety.  Patient is in the dayroom watching TV and interacting with staff.  Blood glucose level of 363 obtained.  Patient refused Metformin 1000 mg and refused to eat after several encouragement and education.  Patient continues to states that she is not hungry.  T. Melvyn NethLewis, NP made aware of behavior and medication refusal.

## 2015-04-26 NOTE — Progress Notes (Signed)
This nurse and charge nurse Christa, RN met with pt in her assigned room in regards to pt complaints to other members of the team. Offered pt room change to promote optimal care. Pt reports she is receiving good care and does not want a room change. Pt has no insight in regards to her behavior at this time. Refusing to change rooms. Will continue to monitor.

## 2015-04-26 NOTE — Progress Notes (Signed)
D:Per patient self inventory form pt reports she slept fair last night. She reports a good appetite, low energy level, poor concentration. She rates depression 10/10, hopelessness 10/10, anxiety 10/10, all on 0-10 scale, 10 being the worse. Pt reports a physical problem "high blood sugar" and c/o generalized body pain. Pt reports passive SI, denies HI. Denies AVH. Pt reports her goal for the day is "To remain positive because it's hard" and that she will "pray" to help meet her goal today.   A:Special checks q 15 mins in place for safety. Medication administered per MD order (See eMAR). Encouragement and support provided.  R:Safety maintained. Pt verbally contract for safety. Reports she feels safe in the hospital and comfortable talking to nursing staff. Will continue to monitor.

## 2015-04-26 NOTE — Progress Notes (Addendum)
During room change nursing staff found multiple snacks/sugar packets/empty juice cups in patients pillow case. Pt is a diabetic. On admission per report pt asked clinician how much insulin she would need to overdose on to die in sleep. Dr. Mariam DollarEapen notified of situation. Pt placed on 1:1 @ 1515 for safety. Transfer of care report given to nurse Lanora ManisElizabeth, RN d/t pt room change.

## 2015-04-27 LAB — GLUCOSE, CAPILLARY
Glucose-Capillary: 115 mg/dL — ABNORMAL HIGH (ref 65–99)
Glucose-Capillary: 182 mg/dL — ABNORMAL HIGH (ref 65–99)
Glucose-Capillary: 211 mg/dL — ABNORMAL HIGH (ref 65–99)
Glucose-Capillary: 212 mg/dL — ABNORMAL HIGH (ref 65–99)
Glucose-Capillary: 295 mg/dL — ABNORMAL HIGH (ref 65–99)
Glucose-Capillary: 366 mg/dL — ABNORMAL HIGH (ref 65–99)
Glucose-Capillary: 55 mg/dL — ABNORMAL LOW (ref 65–99)
Glucose-Capillary: 58 mg/dL — ABNORMAL LOW (ref 65–99)

## 2015-04-27 MED ORDER — INSULIN ASPART PROT & ASPART (70-30 MIX) 100 UNIT/ML ~~LOC~~ SUSP
35.0000 [IU] | Freq: Two times a day (BID) | SUBCUTANEOUS | Status: DC
  Administered 2015-04-27 – 2015-04-28 (×2): 35 [IU] via SUBCUTANEOUS

## 2015-04-27 NOTE — Progress Notes (Signed)
Hypoglycemic Event  CBG: 55  Treatment: 15 GM carbohydrate snack  Symptoms: None  Follow-up CBG: Time:2326 and 0001 CBG Result:58 and 115  Possible Reasons for Event: Other: extreme wings in CBG levels  Comments/MD notified: NP notified    Carolyn Ayala [name]

## 2015-04-27 NOTE — Progress Notes (Signed)
Adult Psychoeducational Group Note  Date:  04/27/2015 Time:  12:23 AM  Group Topic/Focus:  Wrap-Up Group:   The focus of this group is to help patients review their daily goal of treatment and discuss progress on daily workbooks.  Participation Level:  Active  Participation Quality:  Attentive  Affect:  Appropriate  Cognitive:  Appropriate  Insight: Appropriate  Engagement in Group:  Engaged  Modes of Intervention:  Education  Additional Comments:  Pt overall had a good day. Pt is getting more comfortable with the program schedule.   Merlinda FrederickKeshia S Mertis Mosher 04/27/2015, 12:23 AM

## 2015-04-27 NOTE — Progress Notes (Signed)
Did not attend group 

## 2015-04-27 NOTE — Progress Notes (Signed)
Nursing Note: 1600 Safety note:   D:  Mood is anxious, affect is animated and anxious  "To be truthful, I take like 60 units of insulin and then drink beer the rest of the day."  "Some people say that you can lose your feet and stuff, but that is NOT true, that is only if you have cancer, not diabetes.."  "I have been in a bad place, my mother died and this has been hard for me."  A:  Encouraged to verbalize needs and concerns, active listening and support provided.  Continued Q 15 minute safety checks.  Observed active participation in group settings. Information given regarding diabetic diet to patient and briefly reviewed.  Pt needs significant ongoing education.  R:  Pt. denies A/V hallucinations and is able to verbally contract for safety. Pt. is pleasant and verbalizes that she does want to "get better...but it's hard."

## 2015-04-27 NOTE — Progress Notes (Signed)
Pt at this time is sitting quietly in bed. Pt does not look to be in any distress at this time. Sitter in close proximity to patient. 1: 1 monitoring continues for [name] and other's safety. 15-minute safety checks also continues at this time

## 2015-04-27 NOTE — Progress Notes (Signed)
D: Pt presents flat in affect and anxious in mood. Per pt, she is aiming to keep her cbg's below 200 in order to be accepted into [facility]. Pt is requesting educational materials for her to manage her diabetes. Pt requires constant reminders of appropriate food choices. Pt denies SI , but reports feeling anxious this evening. Pt is visible and active within the milieu. Pt verbally contracts for safety.  A: Writer administered scheduled and prn medications to pt, per MD orders. Continued support and availability as needed was extended to this pt. Staff continues to monitor pt with q4315min checks.  R: No adverse drug reactions noted. Pt receptive to treatment. Pt remains safe at this time.

## 2015-04-27 NOTE — Progress Notes (Signed)
1:1 Safety note: Pt in room with safety attendant.  Late breakfast eaten, pt concerned about insulin coverage and potential of becoming hypoglycemic.  Pt. spoke with this RN about recent loss of mother and also about her son Riki RuskJeremy that is 4511 has Autism.  "He is staying with his father right now."  Pt states, " I really think I need to go home very soon."  Talked with pt about taking this time to grieve and to think about what she needs at this time..the patient reluctantly listened to this RN and said thank-you before leaving the room.  Continued Q 15 minute safety checks.  Pt tells this RN, I checked off in my sheet that I am doing really well, but that is not true..I really am not doing well."

## 2015-04-27 NOTE — Progress Notes (Addendum)
1:1 Note  Pt is alert and oriented x4. Pt complain of moderate anxiety and depression; however, Pt looks calm and relaxed. Pt could be very demanding. Pt with uncontrolled DM continuously ask for snacks and drinks that are inappropriate for a diabetic. Pt does not look to be in any distress at this time. 1:1 staff is present in room with Pt at this time. 1:1 monitoring continues for [name] safety. 15-minute safety checks also continues at this time.

## 2015-04-27 NOTE — Progress Notes (Signed)
1800 Safety Note: Pt sitting in Dayroom about to eat dinner dinner.  Received new order to decrease 70/30 to 35units BID instead of 45 units per PNP. Will administer when pt eats her food.  1845: Pt ate 100% of meal, medication given as ordered.

## 2015-04-27 NOTE — Progress Notes (Signed)
D: Pt in room speaking with Clinical research associatewriter about her day.  A: [facility] Post 1:1 Observation Documentation  For the first (8) hours following discontinuation of 1:1 precautions, a progress note entry by nursing staff should be documented at least every 2 hours, reflecting the patient's behavior, condition, mood, and conversation.  Use the progress notes for additional entries.  Time 1:1 discontinued:  1406  Patient's Behavior:  Pt is in the room sitting on the bench.   Patient's Condition:  Pt appears to be in no signs of distress at this time.  Patient's Conversation:  Pt is discussing her day and her motivation for change.  Rylyn Zawistowski A 04/27/2015, 9:32 PM

## 2015-04-27 NOTE — Progress Notes (Signed)
1400:  Nursing Safety 1:1 note:   Pt. observed in SW Group with attendant at side.  After meeting with PNP, new order obtained to DC 1:1 safety sitting.  Pt verbally contracts to remain safe and if anything changes, she will alert staff.  Discussed the importance of sticking to diabetic diet and this RN has asked that she not take any snacks into her room until further notice.  1:1 officially DC'd at 1415 and Q 15 minute safety checks maintained.

## 2015-04-27 NOTE — Progress Notes (Signed)
NUTRITION NOTE  Pt was seen for DM education 04/24/15. RN reports that pt is anxious about controlling DM upon d/c, which RN states is likely today. Pt was attending AM group so unable to hold full conversation with her. Provided pt with handouts from [employer] website and the Academy [employer] Diet[employer].      Trenton GammonJessica Yasiel Goyne, RD, LDN Inpatient Clinical Dietitian Pager # [phone] After hours/weekend pager # [phone]

## 2015-04-27 NOTE — Progress Notes (Signed)
[facility] MD Progress Note  04/27/2015 2:27 PM Carolyn Ayala  MRN:  [id number]   Subjective:  "I am having a better day I just wanna be discharged."  Objective:Carolyn Ayala is awake and alert, Patient was found on 1:1 attending group sessions. Patient denies suicidal/homicidal ideation. State she is feeling better than yesterday and was asking about discharge.  Sates she feels like the RN/staff was out to get her, however doesn't  feel that way any longer. Patient states she is safe and has concerns regarding discharge. Reports doing better with her food choices.  Denies auditory or visual hallucination. Patient still has rumination regarding glucose control and new discharge date. Reports she is monitoring her intake and denies any mediation side effects. Patient is future oriented about being  accepted into a long term treatment facility even with uncontrolled blood sugar. Support, encouragement and reassurance was provided.    Principal Problem: MDD (major depressive disorder), recurrent severe, without psychosis (HCC) Diagnosis:   Patient Active Problem List   Diagnosis Date Noted  . MDD (major depressive disorder), recurrent severe, without psychosis (HCC) [F33.2] 04/19/2015    Priority: High  . IDDM (insulin dependent diabetes mellitus) (HCC) [E11.9, Z79.4] 02/08/2013    Priority: Medium  . Uncontrolled diabetes mellitus with diabetic neuropathy, with long-term current use of insulin (HCC) [E11.40, Z79.4, E11.65] 04/21/2015  . PTSD (post-traumatic stress disorder) [F43.10] 04/20/2015  . Alcohol dependence with withdrawal, uncomplicated (HCC) [F10.230] 04/20/2015  . Suicidal ideation [R45.851] 04/19/2015  . Recent bereavement [Z78.9] 04/19/2015  . Generalized anxiety disorder [F41.1] 02/13/2013  . Chronic posttraumatic stress disorder [F43.12] 02/13/2013  . Alcoholism with alcohol dependence (HCC) [F10.20] 02/08/2013  . Substance induced mood disorder (HCC) [F19.94] 02/08/2013   Total  Time spent with patient: 30 minutes  Past Psychiatric History: see Admission H and P  Past Medical History:  Past Medical History  Diagnosis Date  . Mental disorder   . Depression   . Diabetes mellitus without complication (HCC)   . Hypertension     Past Surgical History  Procedure Laterality Date  . Cesarean section     Family History:  Family History  Problem Relation Age of Onset  . Depression Mother    Family Psychiatric  History: see Admission H and P Social History:  History  Alcohol Use  . Yes    Comment: daily     History  Drug Use No    Social History   Social History  . Marital Status: Single    Spouse Name: N/A  . Number of Children: N/A  . Years of Education: N/A   Social History Main Topics  . Smoking status: Current Every Day Smoker -- 1.00 packs/day    Types: Cigarettes  . Smokeless tobacco: Current User  . Alcohol Use: Yes     Comment: daily  . Drug Use: No  . Sexual Activity: No   Other Topics Concern  . None   Social History Narrative   ** Merged History Encounter **       Additional Social History:    Pain Medications: flexerilfor leg neuropathy 2 days ago Prescriptions: Insulin Levamir 60 units 2/D, Novalog befoe each meal sliding scale, Lisinopril  once daily, Gabapention 3x/D prn History of alcohol / drug use?: Yes Name of Substance 1: ETOH 1 - Age of First Use: 39 years of age 68 - Amount (size/oz): 2 40 oz beers daily 1 - Frequency: daily x 1 month 1 - Duration: since mother died a month ago  1 - Last Use / Amount: 2 days ago    Sleep: Fair  Appetite:  Fair  Current Medications: Current Facility-Administered Medications  Medication Dose Route Frequency Provider Last Rate Last Dose  . acetaminophen (TYLENOL) tablet 650 mg  650 mg Oral Q6H PRN Oneta Rack, NP   650 mg at 04/21/15 0804  . alum & mag hydroxide-simeth (MAALOX/MYLANTA) 200-200-20 MG/5ML suspension 30 mL  30 mL Oral Q4H PRN Oneta Rack, NP      .  benzocaine (ORAJEL) 10 % mucosal gel   Mouth/Throat TID PRN Oneta Rack, NP      . carisoprodol (SOMA) tablet 350 mg  350 mg Oral QID PRN Rachael Fee, MD   350 mg at 04/27/15 1148  . feeding supplement (GLUCERNA SHAKE) (GLUCERNA SHAKE) liquid 237 mL  237 mL Oral BID BM Anderson Malta [place], RD   237 mL at 04/27/15 1333  . FLUoxetine (PROZAC) capsule 60 mg  60 mg Oral Daily Rachael Fee, MD   60 mg at 04/27/15 0843  . gabapentin (NEURONTIN) capsule 1,000 mg  1,000 mg Oral TID Rachael Fee, MD   1,000 mg at 04/27/15 1146  . gabapentin (NEURONTIN) capsule 600 mg  600 mg Oral QHS Rachael Fee, MD   600 mg at 04/26/15 2154  . hydrOXYzine (ATARAX/VISTARIL) tablet 50 mg  50 mg Oral Q4H PRN Rachael Fee, MD   50 mg at 04/27/15 1148  . ibuprofen (ADVIL,MOTRIN) tablet 800 mg  800 mg Oral Q6H PRN Rachael Fee, MD   800 mg at 04/27/15 1150  . Influenza vac split quadrivalent PF (FLUARIX) injection 0.5 mL  0.5 mL Intramuscular Tomorrow-1000 Beau Fanny, FNP   0.5 mL at 04/20/15 1346  . insulin aspart (novoLOG) injection 0-15 Units  0-15 Units Subcutaneous TID WC Oneta Rack, NP   8 Units at 04/27/15 1328  . insulin aspart (novoLOG) injection 0-5 Units  0-5 Units Subcutaneous QHS Oneta Rack, NP   Stopped at 04/26/15 2146  . insulin aspart protamine- aspart (NOVOLOG MIX 70/30) injection 45 Units  45 Units Subcutaneous BID WC Kristeen Mans, NP   45 Units at 04/27/15 0931  . lisinopril (PRINIVIL,ZESTRIL) tablet 5 mg  5 mg Oral Daily Oneta Rack, NP   5 mg at 04/27/15 0845  . loperamide (IMODIUM) capsule 4 mg  4 mg Oral PRN Rachael Fee, MD   4 mg at 04/26/15 2156  . loratadine (CLARITIN) tablet 10 mg  10 mg Oral q morning - 10a Oneta Rack, NP   10 mg at 04/27/15 0848  . magnesium hydroxide (MILK OF MAGNESIA) suspension 30 mL  30 mL Oral Daily PRN Oneta Rack, NP      . metFORMIN (GLUCOPHAGE) tablet 1,000 mg  1,000 mg Oral BID WC Rachael Fee, MD   1,000 mg at 04/26/15 0753  . mirtazapine  (REMERON) tablet 30 mg  30 mg Oral QHS Oneta Rack, NP   30 mg at 04/19/15 2200  . multivitamin with minerals tablet 1 tablet  1 tablet Oral Daily Worthy Flank, NP   1 tablet at 04/27/15 0844  . pneumococcal 23 valent vaccine (PNU-IMMUNE) injection 0.5 mL  0.5 mL Intramuscular Tomorrow-1000 Beau Fanny, FNP   0.5 mL at 04/20/15 1346  . thiamine (B-1) injection 100 mg  100 mg Intramuscular Once Worthy Flank, NP   100 mg at 04/19/15 2145  . thiamine (VITAMIN  B-1) tablet 100 mg  100 mg Oral Daily Worthy Flank, NP   100 mg at 04/27/15 0845  . traMADol (ULTRAM) tablet 50 mg  50 mg Oral Q6H PRN Rachael Fee, MD   50 mg at 04/27/15 1332    Lab Results:  Results for orders placed or performed during the hospital encounter of 04/19/15 (from the past 48 hour(s))  Glucose, capillary     Status: Abnormal   Collection Time: 04/25/15  4:55 PM  Result Value Ref Range   Glucose-Capillary 439 (H) 65 - 99 mg/dL  Glucose, capillary     Status: Abnormal   Collection Time: 04/25/15  9:00 PM  Result Value Ref Range   Glucose-Capillary 64 (L) 65 - 99 mg/dL   Comment 1 Notify RN    Comment 2 Document in Chart   Glucose, capillary     Status: Abnormal   Collection Time: 04/25/15  9:36 PM  Result Value Ref Range   Glucose-Capillary 136 (H) 65 - 99 mg/dL   Comment 1 Document in Chart    Comment 2 Repeat Test   Glucose, capillary     Status: Abnormal   Collection Time: 04/26/15  6:20 AM  Result Value Ref Range   Glucose-Capillary 528 (H) 65 - 99 mg/dL   Comment 1 Notify RN    Comment 2 Document in Chart   Glucose, capillary     Status: Abnormal   Collection Time: 04/26/15 11:31 AM  Result Value Ref Range   Glucose-Capillary 226 (H) 65 - 99 mg/dL  Glucose, capillary     Status: Abnormal   Collection Time: 04/26/15  4:45 PM  Result Value Ref Range   Glucose-Capillary 363 (H) 65 - 99 mg/dL  Glucose, capillary     Status: Abnormal   Collection Time: 04/26/15  8:22 PM  Result Value Ref Range    Glucose-Capillary 204 (H) 65 - 99 mg/dL  Glucose, capillary     Status: Abnormal   Collection Time: 04/26/15 11:26 PM  Result Value Ref Range   Glucose-Capillary 55 (L) 65 - 99 mg/dL  Glucose, capillary     Status: Abnormal   Collection Time: 04/26/15 11:43 PM  Result Value Ref Range   Glucose-Capillary 58 (L) 65 - 99 mg/dL  Glucose, capillary     Status: Abnormal   Collection Time: 04/27/15 12:01 AM  Result Value Ref Range   Glucose-Capillary 115 (H) 65 - 99 mg/dL  Glucose, capillary     Status: Abnormal   Collection Time: 04/27/15  6:01 AM  Result Value Ref Range   Glucose-Capillary 212 (H) 65 - 99 mg/dL   Comment 1 Notify RN    Comment 2 Document in Chart   Glucose, capillary     Status: Abnormal   Collection Time: 04/27/15  9:00 AM  Result Value Ref Range   Glucose-Capillary 366 (H) 65 - 99 mg/dL  Glucose, capillary     Status: Abnormal   Collection Time: 04/27/15 11:39 AM  Result Value Ref Range   Glucose-Capillary 295 (H) 65 - 99 mg/dL   Comment 1 Notify RN    Comment 2 Document in Chart     Physical Findings: AIMS: Facial and Oral Movements Muscles of Facial Expression: None, normal Lips and Perioral Area: None, normal Jaw: None, normal Tongue: None, normal,Extremity Movements Upper (arms, wrists, hands, fingers): None, normal Lower (legs, knees, ankles, toes): None, normal, Trunk Movements Neck, shoulders, hips: None, normal, Overall Severity Severity of abnormal movements (highest score from  questions above): None, normal Incapacitation due to abnormal movements: None, normal Patient's awareness of abnormal movements (rate only patient's report): No Awareness, Dental Status Current problems with teeth and/or dentures?: No Does patient usually wear dentures?: No  CIWA:  CIWA-Ar Total: 2 COWS:  COWS Total Score: 3  Musculoskeletal: Strength & Muscle Tone: within normal limits Gait & Station: normal Patient leans: normal  Psychiatric Specialty  Exam: Review of Systems  Constitutional: Malaise/fatigue: deneis fatigue.  HENT: Negative.   Eyes: Negative.   Respiratory: Negative.   Cardiovascular: Negative.   Gastrointestinal: Negative.   Genitourinary: Negative.   Musculoskeletal: Negative for joint pain (denies joint aches/or pain today).       Aches pains, legs pains  Skin: Negative.   Neurological: Negative.  Negative for headaches.  Endo/Heme/Allergies: Negative.   Psychiatric/Behavioral: Positive for depression and substance abuse. Negative for suicidal ideas. The patient is nervous/anxious.     Blood pressure 110/82, pulse 105, temperature 98.6 F (37 C), temperature source Oral, resp. rate 16, height 5' 2.01" (1.575 m), weight 77.565 kg (171 lb), last menstrual period 03/25/2015.Body mass index is 31.27 kg/(m^2).  General Appearance: Casual  Eye Contact::  Good  Speech:  Normal Rate  Volume:  Normal  Mood:  Anxious  Affect:  anxious depressed worried Paranoid ideations that staff and others are after her.  Thought Process:  Intact  Orientation:  Full (Time, Place, and Person)  Thought Content:  Hallucinations: None and symptoms events worries concerns  Suicidal Thoughts:  No  Homicidal Thoughts:  No  Memory:  Immediate;   Fair Recent;   Fair Remote;   Fair  Judgement:  Fair  Insight:  Lacking  Psychomotor Activity:  Normal  Concentration:  Fair  Recall:  [employer]Fair  Fund of Knowledge:Fair  Language: Fair  Akathisia:  No  Handed:  Right  AIMS (if indicated):     Assets:  Desire for Improvement Social Support Vocational/Educational  ADL's:  Intact  Cognition: WNL  Sleep:  Number of Hours: 3    I agree with current treatment plan 04/27/2015  Treatment Plan Summary: Daily contact with patient to assess and evaluate symptoms and progress in treatment and Medication management Supportive approach/coping skills -Patient will log food intake./ calories diary  -Charge RN/AC is considering room/hall  change discontinued patients 1:1 order 04/27/15 Alcohol dependence; continue to work a relapse prevention plan Depression; continue to work with the Prozac 60 mg daily Insomnia; continue to work with the Remeron 30 mg  Neuropathy; continue to work with the Neurontin 3600 daily Diabetes: continue to follow int med recommendations Continue to explore residential treatment options once medically stable  Oneta Rackanika N Lewis FNP- [facility] 04/27/2015, 2:27 PM I agree with assessment and plan Reymundo PollIrving A. Dub MikesLugo, M.D.

## 2015-04-27 NOTE — Progress Notes (Signed)
1:1 Note  Pt at this time is in bed resting. Pt does not look to be in any distress at this time. 1:1 staff is present in room with Pt at this time. 1:1 monitoring continues for [name] safety. 15-minute safety checks also continues at this time. 

## 2015-04-27 NOTE — [facility] Group Notes (Signed)
[facility] Group Notes:  (Counselor/Nursing/MHT/Case Management/Adjunct)  04/27/2015 1:15PM  Type of Therapy:  Group Therapy  Participation Level:  Active  Participation Quality:  Appropriate  Affect:  Flat  Cognitive:  Oriented  Insight:  Improving  Engagement in Group:  Limited  Engagement in Therapy:  Limited  Modes of Intervention:  Discussion, Exploration and Socialization  Summary of Progress/Problems: The topic for group was balance in life.  Pt participated in the discussion about when their life was in balance and out of balance and how this feels.  Pt discussed ways to get back in balance and short term goals they can work on to get where they want to be. Stayed the entire time.  Minimal participation.  In attendance with her 1:1.  Was working on a word finding puzzle book much of the time, and cited it as a source of stress relief and diversion "to help me ignore all the nonsense."  Went to sleep sometime in the second half of group.   Daryel Geraldorth, Danta Baumgardner B 04/27/2015 3:15 PM

## 2015-04-27 NOTE — Progress Notes (Signed)
[facility] Post 1:1 Observation Documentation  For the first (8) hours following discontinuation of 1:1 precautions, a progress note entry by nursing staff should be documented at least every 2 hours, reflecting the patient's behavior, condition, mood, and conversation.  Use the progress notes for additional entries.  Time 1:1 discontinued:  1406  Patient's Behavior: Pt observed interacting with another patient while watching tv in the dayroom.   Patient's Condition:  Pt appeared to be in no signs of distress at this time.   Patient's Conversation:  Carolyn Luzn/a  Carolyn Ayala A 04/27/2015, 10:20 PM

## 2015-04-28 DIAGNOSIS — R45851 Suicidal ideations: Secondary | ICD-10-CM

## 2015-04-28 LAB — GLUCOSE, CAPILLARY
Glucose-Capillary: 269 mg/dL — ABNORMAL HIGH (ref 65–99)
Glucose-Capillary: 270 mg/dL — ABNORMAL HIGH (ref 65–99)
Glucose-Capillary: 285 mg/dL — ABNORMAL HIGH (ref 65–99)
Glucose-Capillary: 303 mg/dL — ABNORMAL HIGH (ref 65–99)
Glucose-Capillary: 463 mg/dL — ABNORMAL HIGH (ref 65–99)

## 2015-04-28 MED ORDER — LIDOCAINE 5 % EX PTCH
1.0000 | MEDICATED_PATCH | Freq: Every day | CUTANEOUS | Status: DC
  Administered 2015-04-29 – 2015-05-01 (×3): 1 via TRANSDERMAL
  Filled 2015-04-28 (×3): qty 1
  Filled 2015-04-28: qty 7
  Filled 2015-04-28 (×3): qty 1

## 2015-04-28 MED ORDER — ARIPIPRAZOLE 5 MG PO TABS
2.5000 mg | ORAL_TABLET | ORAL | Status: DC
  Administered 2015-04-28 – 2015-04-29 (×3): 2.5 mg via ORAL
  Filled 2015-04-28 (×7): qty 1

## 2015-04-28 MED ORDER — GABAPENTIN 400 MG PO CAPS
800.0000 mg | ORAL_CAPSULE | Freq: Every day | ORAL | Status: DC
  Administered 2015-04-28 – 2015-04-30 (×3): 800 mg via ORAL
  Filled 2015-04-28 (×5): qty 2

## 2015-04-28 MED ORDER — MIRTAZAPINE 7.5 MG PO TABS
7.5000 mg | ORAL_TABLET | Freq: Every day | ORAL | Status: DC
  Filled 2015-04-28 (×3): qty 1
  Filled 2015-04-28: qty 7
  Filled 2015-04-28: qty 1

## 2015-04-28 MED ORDER — HYDROXYZINE HCL 50 MG PO TABS
50.0000 mg | ORAL_TABLET | Freq: Three times a day (TID) | ORAL | Status: DC | PRN
  Administered 2015-04-28 – 2015-05-01 (×8): 50 mg via ORAL
  Filled 2015-04-28 (×8): qty 1
  Filled 2015-04-28: qty 10
  Filled 2015-04-28 (×2): qty 1

## 2015-04-28 MED ORDER — INSULIN ASPART PROT & ASPART (70-30 MIX) 100 UNIT/ML ~~LOC~~ SUSP
40.0000 [IU] | Freq: Two times a day (BID) | SUBCUTANEOUS | Status: DC
  Administered 2015-04-28 – 2015-04-29 (×2): 40 [IU] via SUBCUTANEOUS

## 2015-04-28 MED ORDER — FLUOXETINE HCL 20 MG PO CAPS
20.0000 mg | ORAL_CAPSULE | Freq: Every day | ORAL | Status: DC
  Administered 2015-04-29 – 2015-05-01 (×3): 20 mg via ORAL
  Filled 2015-04-28 (×4): qty 1
  Filled 2015-04-28: qty 7

## 2015-04-28 MED ORDER — CAPSAICIN 0.025 % EX CREA
TOPICAL_CREAM | Freq: Two times a day (BID) | CUTANEOUS | Status: DC
  Administered 2015-04-30 – 2015-05-01 (×3): via TOPICAL
  Filled 2015-04-28: qty 56.6

## 2015-04-28 MED ORDER — DIVALPROEX SODIUM 250 MG PO DR TAB
250.0000 mg | DELAYED_RELEASE_TABLET | Freq: Three times a day (TID) | ORAL | Status: DC
  Administered 2015-04-28 – 2015-05-01 (×10): 250 mg via ORAL
  Filled 2015-04-28 (×2): qty 1
  Filled 2015-04-28: qty 21
  Filled 2015-04-28: qty 1
  Filled 2015-04-28: qty 21
  Filled 2015-04-28 (×6): qty 1
  Filled 2015-04-28: qty 21
  Filled 2015-04-28 (×5): qty 1

## 2015-04-28 NOTE — Progress Notes (Signed)
Inpatient Diabetes Program Recommendations  AACE/ADA: New Consensus Statement on Inpatient Glycemic Control (2015)  Target Ranges:  Prepandial:   less than 140 mg/dL      Peak postprandial:   less than 180 mg/dL (1-2 hours)      Critically ill patients:  140 - 180 mg/dL   Review of Glycemic Control  Received consult order to speak with pt about her diabetes. Also question was raised as to whether pt could go home on OHAs instead of insulin. Coordinator spoke with pt and states she only wants demonstration of insulin administration with syringe and vial. Fixated on technique and refused to discuss glucose monitoring, diabetes complications, importance of obtaining a PCP to manage diabetes. Pt was upset and stated, "You wasted my time." Explained to pt my role as Diabetes Coordinator and RN would be happy to teach insulin administration, however pt was upset and walked out of room. RN states pt was obsessing over technique since using insulin to OD. Will give care notes and RN to discuss and demonstrate correct technique with insulin administration.  Blood sugars in 200s.  Inpatient Diabetes Program Recommendations:    Increase 70/30 to 40 units bid Needs PCP to manage DM in outpatient setting.  Will continue to follow.  Thank you. Carolyn Ayala, RD, LDN, CDE Inpatient Diabetes Coordinator [phone]

## 2015-04-28 NOTE — Progress Notes (Signed)
  RD consulted for nutrition education regarding diabetes.   Lab Results  Component Value Date   HGBA1C 11.5* 04/19/2015    RD provided "Carbohydrate Counting for People with Diabetes" handout from the Academy of Nutrition and Dietetics. Discussed different food groups and their effects on blood sugar, emphasizing carbohydrate-containing foods. Provided list of carbohydrates and recommended serving sizes of common foods.  Discussed importance of controlled and consistent carbohydrate intake throughout the day. Provided examples of ways to balance meals/snacks and encouraged intake of high-fiber, whole grain complex carbohydrates. Teach back method used.  Expect poor compliance.  Body mass index is 31.27 kg/(m^2). Pt meets criteria for Obese based on current BMI.  Current diet order is regular, patient is consuming approximately unknown% of meals at this time. Labs and medications reviewed. No further nutrition interventions warranted at this time. RD contact information provided. If additional nutrition issues arise, please re-consult RD.  Dionne AnoWilliam M. Sameul Tagle, MS, RD LDN After Hours/Weekend Pager [phone]

## 2015-04-28 NOTE — Progress Notes (Addendum)
[facility] MD Progress Note  04/28/2015 2:54 PM Carolyn Ayala  MRN:  [id number]   Subjective:  "I am having ups and downs, I feel so restless and anxious and I feel like I am kind of manic. I have had this feeling in the past , and some times I feel like I cannot shut down my body . I have had periods when I will be up all night because I cannot sleep , and I have had periods when I feel like I have high energy.'    Objective:Carolyn Ayala is a 39 y.o.AA Female who was admitted to [facility] due to having thoughts of killing herself since the death of her mother a month ago. Patient per initial notes in EHR " was feeling depressed since the death of her mother .Pt reported several plans on admission like - "  I could do several things like get a BB gun from [employer] to make the cops think I had a gun, inject myself with a massive dose of insulin, and step in front of a large truck. I have been drinking more to deal with the loss but when I sober up it is still there."  Patient seen today and chart reviewed.Discussed patient with treatment team. Pt today reports that she does not feel the Prozac is helping. Pt reports that she feel as though she is "manic.' Pt reports that she has had severe mood swings prior to admission. Pt reports having had periods when she felt like she was restless and would do reckless things due to having a lot of energy , including not sleeping at night. Pt reports that she continues to feel restless and anxious. Pt also has been dealing with severe burning pain 2/2 her neuropathy.Pt is already on a high dose of gabapentin , wants it increased. Pt continues to struggle with depression , mostly due to her mother's death. Pt denies SI today . Pt denies any psychosis. Pt continues to have CBGs that are unstable - hence will reconsult Diabetic coordinator. Pt would also want to talk directly to a dietician , and wants some one to sit down and explain everything to her. Will  reconsult Dietician.  Per nursing pt continues to be anxious , restless and has CBGs that are not controlled . Will continue to need support .    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 Uncontrolled diabetes mellitus with diabetic neuropathy, with long-term current use of insulin (HCC) [E11.40, Z79.4, E11.65] 04/21/2015  . PTSD (post-traumatic stress disorder) [F43.10] 04/20/2015  . Alcohol dependence with withdrawal, uncomplicated (HCC) [F10.230] 04/20/2015  . Suicidal ideation [R45.851] 04/19/2015  . Recent bereavement [Z78.9] 04/19/2015  . MDD (major depressive disorder), recurrent severe, without psychosis (HCC) [F33.2] 04/19/2015  . Generalized anxiety disorder [F41.1] 02/13/2013  . Chronic posttraumatic stress disorder [F43.12] 02/13/2013  . Alcoholism with alcohol dependence (HCC) [F10.20] 02/08/2013  . Substance induced mood disorder (HCC) [F19.94] 02/08/2013  . IDDM (insulin dependent diabetes mellitus) (HCC) [E11.9, Z79.4] 02/08/2013   Total Time spent with patient: 30 minutes  Past Psychiatric History: see Admission H and P  Past Medical History:  Past Medical History  Diagnosis Date  . Mental disorder   . Depression   . Diabetes mellitus without complication (HCC)   . Hypertension     Past  Surgical History  Procedure Laterality Date  . Cesarean section     Family History:  Family History  Problem Relation Age of Onset  . Depression Mother    Family Psychiatric  History: see Admission H and P Social History:  History  Alcohol Use  . Yes    Comment: daily     History  Drug Use No    Social History   Social History  . Marital Status: Single    Spouse Name: N/A  . Number of Children: N/A  . Years of Education: N/A   Social History Main Topics  .  Smoking status: Current Every Day Smoker -- 1.00 packs/day    Types: Cigarettes  . Smokeless tobacco: Current User  . Alcohol Use: Yes     Comment: daily  . Drug Use: No  . Sexual Activity: No   Other Topics Concern  . None   Social History Narrative   ** Merged History Encounter **       Additional Social History:    Pain Medications: flexerilfor leg neuropathy 2 days ago Prescriptions: Insulin Levamir 60 units 2/D, Novalog befoe each meal sliding scale, Lisinopril  once daily, Gabapention 3x/D prn History of alcohol / drug use?: Yes Name of Substance 1: ETOH 1 - Age of First Use: 39 years of age 53 - Amount (size/oz): 2 40 oz beers daily 1 - Frequency: daily x 1 month 1 - Duration: since mother died a month ago 1 - Last Use / Amount: 2 days ago    Sleep: Fair  Appetite:  Fair  Current Medications: Current Facility-Administered Medications  Medication Dose Route Frequency Provider Last Rate Last Dose  . acetaminophen (TYLENOL) tablet 650 mg  650 mg Oral Q6H PRN Oneta Rack, NP   650 mg at 04/21/15 0804  . alum & mag hydroxide-simeth (MAALOX/MYLANTA) 200-200-20 MG/5ML suspension 30 mL  30 mL Oral Q4H PRN Oneta Rack, NP      . ARIPiprazole (ABILIFY) tablet 2.5 mg  2.5 mg Oral BH-qamhs Ande Therrell, MD   2.5 mg at 04/28/15 1300  . benzocaine (ORAJEL) 10 % mucosal gel   Mouth/Throat TID PRN Oneta Rack, NP      . capsaicin (ZOSTRIX) 0.025 % cream   Topical BID Jomarie Longs, MD      . carisoprodol (SOMA) tablet 350 mg  350 mg Oral QID PRN Rachael Fee, MD   350 mg at 04/28/15 1610  . divalproex (DEPAKOTE) DR tablet 250 mg  250 mg Oral 3 times per day Jomarie Longs, MD   250 mg at 04/28/15 1302  . feeding supplement (GLUCERNA SHAKE) (GLUCERNA SHAKE) liquid 237 mL  237 mL Oral BID BM Anderson Malta [place], RD   237 mL at 04/28/15 1006  . [START ON 04/29/2015] FLUoxetine (PROZAC) capsule 20 mg  20 mg Oral Daily Riyaan Heroux, MD      . gabapentin (NEURONTIN)  capsule 1,000 mg  1,000 mg Oral TID Rachael Fee, MD   1,000 mg at 04/28/15 1301  . gabapentin (NEURONTIN) capsule 800 mg  800 mg Oral QHS Sahra Converse, MD      . hydrOXYzine (ATARAX/VISTARIL) tablet 50 mg  50 mg Oral TID PRN Jomarie Longs, MD      . ibuprofen (ADVIL,MOTRIN) tablet 800 mg  800 mg Oral Q6H PRN Rachael Fee, MD   800 mg at 04/27/15 1150  . Influenza vac split quadrivalent PF (FLUARIX) injection 0.5 mL  0.5 mL Intramuscular  Tomorrow-1000 Beau Fanny, FNP   0.5 mL at 04/20/15 1346  . insulin aspart (novoLOG) injection 0-15 Units  0-15 Units Subcutaneous TID WC Oneta Rack, NP   8 Units at 04/28/15 1306  . insulin aspart (novoLOG) injection 0-5 Units  0-5 Units Subcutaneous QHS Oneta Rack, NP   Stopped at 04/26/15 2146  . insulin aspart protamine- aspart (NOVOLOG MIX 70/30) injection 35 Units  35 Units Subcutaneous BID WC Oneta Rack, NP   35 Units at 04/28/15 0824  . lidocaine (LIDODERM) 5 % 1 patch  1 patch Transdermal Daily Jomarie Longs, MD   1 patch at 04/28/15 1302  . lisinopril (PRINIVIL,ZESTRIL) tablet 5 mg  5 mg Oral Daily Oneta Rack, NP   5 mg at 04/28/15 1610  . loperamide (IMODIUM) capsule 4 mg  4 mg Oral PRN Rachael Fee, MD   4 mg at 04/26/15 2156  . loratadine (CLARITIN) tablet 10 mg  10 mg Oral q morning - 10a Oneta Rack, NP   10 mg at 04/28/15 1008  . magnesium hydroxide (MILK OF MAGNESIA) suspension 30 mL  30 mL Oral Daily PRN Oneta Rack, NP      . metFORMIN (GLUCOPHAGE) tablet 1,000 mg  1,000 mg Oral BID WC Rachael Fee, MD   1,000 mg at 04/26/15 0753  . mirtazapine (REMERON) tablet 7.5 mg  7.5 mg Oral QHS Maezie Justin, MD      . multivitamin with minerals tablet 1 tablet  1 tablet Oral Daily Worthy Flank, NP   1 tablet at 04/28/15 9604  . pneumococcal 23 valent vaccine (PNU-IMMUNE) injection 0.5 mL  0.5 mL Intramuscular Tomorrow-1000 Beau Fanny, FNP   0.5 mL at 04/20/15 1346  . thiamine (B-1) injection 100 mg  100 mg  Intramuscular Once Worthy Flank, NP   100 mg at 04/19/15 2145  . thiamine (VITAMIN B-1) tablet 100 mg  100 mg Oral Daily Worthy Flank, NP   100 mg at 04/28/15 5409    Lab Results:  Results for orders placed or performed during the hospital encounter of 04/19/15 (from the past 48 hour(s))  Glucose, capillary     Status: Abnormal   Collection Time: 04/26/15  4:45 PM  Result Value Ref Range   Glucose-Capillary 363 (H) 65 - 99 mg/dL  Glucose, capillary     Status: Abnormal   Collection Time: 04/26/15  8:22 PM  Result Value Ref Range   Glucose-Capillary 204 (H) 65 - 99 mg/dL  Glucose, capillary     Status: Abnormal   Collection Time: 04/26/15 11:26 PM  Result Value Ref Range   Glucose-Capillary 55 (L) 65 - 99 mg/dL  Glucose, capillary     Status: Abnormal   Collection Time: 04/26/15 11:43 PM  Result Value Ref Range   Glucose-Capillary 58 (L) 65 - 99 mg/dL  Glucose, capillary     Status: Abnormal   Collection Time: 04/27/15 12:01 AM  Result Value Ref Range   Glucose-Capillary 115 (H) 65 - 99 mg/dL  Glucose, capillary     Status: Abnormal   Collection Time: 04/27/15  6:01 AM  Result Value Ref Range   Glucose-Capillary 212 (H) 65 - 99 mg/dL   Comment 1 Notify RN    Comment 2 Document in Chart   Glucose, capillary     Status: Abnormal   Collection Time: 04/27/15  9:00 AM  Result Value Ref Range   Glucose-Capillary 366 (H) 65 - 99  mg/dL  Glucose, capillary     Status: Abnormal   Collection Time: 04/27/15 11:39 AM  Result Value Ref Range   Glucose-Capillary 295 (H) 65 - 99 mg/dL   Comment 1 Notify RN    Comment 2 Document in Chart   Glucose, capillary     Status: Abnormal   Collection Time: 04/27/15  4:57 PM  Result Value Ref Range   Glucose-Capillary 211 (H) 65 - 99 mg/dL   Comment 1 Notify RN    Comment 2 Document in Chart   Glucose, capillary     Status: Abnormal   Collection Time: 04/27/15  8:40 PM  Result Value Ref Range   Glucose-Capillary 182 (H) 65 - 99 mg/dL    Comment 1 Notify RN   Glucose, capillary     Status: Abnormal   Collection Time: 04/27/15 11:59 PM  Result Value Ref Range   Glucose-Capillary 285 (H) 65 - 99 mg/dL   Comment 1 Notify RN   Glucose, capillary     Status: Abnormal   Collection Time: 04/28/15  6:14 AM  Result Value Ref Range   Glucose-Capillary 269 (H) 65 - 99 mg/dL  Glucose, capillary     Status: Abnormal   Collection Time: 04/28/15 11:52 AM  Result Value Ref Range   Glucose-Capillary 270 (H) 65 - 99 mg/dL    Physical Findings: AIMS: Facial and Oral Movements Muscles of Facial Expression: None, normal Lips and Perioral Area: None, normal Jaw: None, normal Tongue: None, normal,Extremity Movements Upper (arms, wrists, hands, fingers): None, normal Lower (legs, knees, ankles, toes): None, normal, Trunk Movements Neck, shoulders, hips: None, normal, Overall Severity Severity of abnormal movements (highest score from questions above): None, normal Incapacitation due to abnormal movements: None, normal Patient's awareness of abnormal movements (rate only patient's report): No Awareness, Dental Status Current problems with teeth and/or dentures?: No Does patient usually wear dentures?: No  CIWA:  CIWA-Ar Total: 2 COWS:  COWS Total Score: 3  Musculoskeletal: Strength & Muscle Tone: within normal limits Gait & Station: normal Patient leans: normal  Psychiatric Specialty Exam: Review of Systems  Constitutional: Malaise/fatigue: deneis fatigue.  HENT: Negative.   Eyes: Negative.  Negative for double vision.  Respiratory: Negative.   Cardiovascular: Negative.   Gastrointestinal: Negative.   Genitourinary: Negative.   Musculoskeletal: Negative for joint pain (denies joint aches/or pain today).       Aches pains, legs pains  Skin: Negative.   Neurological: Negative.  Negative for headaches.  Endo/Heme/Allergies: Negative.   Psychiatric/Behavioral: Positive for depression and substance abuse. Negative for suicidal  ideas. The patient is nervous/anxious.     Blood pressure 119/84, pulse 94, temperature 98 F (36.7 C), temperature source Oral, resp. rate 16, height 5' 2.01" (1.575 m), weight 77.565 kg (171 lb), last menstrual period 03/25/2015.Body mass index is 31.27 kg/(m^2).  General Appearance: Casual  Eye Contact::  Good  Speech:  Normal Rate  Volume:  Normal  Mood:  Anxious  Affect:  anxious depressed worried.  Thought Process:  Intact  Orientation:  Full (Time, Place, and Person)  Thought Content:  Rumination  Suicidal Thoughts:  Yes.  without intent/plan improving  Homicidal Thoughts:  No  Memory:  Immediate;   Fair Recent;   Fair Remote;   Fair  Judgement:  Fair  Insight:  Lacking  Psychomotor Activity:  Restlessness  Concentration:  Poor  Recall:  [employer] of Knowledge:Fair  Language: Fair  Akathisia:  No  Handed:  Right  AIMS (if indicated):  Assets:  Desire for Improvement Social Support Vocational/Educational  ADL's:  Intact  Cognition: WNL  Sleep:  Number of Hours: 5.75    Treatment Plan Summary:Patient continues to be restless , anxious , struggles with grief after the death of her mother. She also has coexisting medical issues like uncontrolled DM , neuropathy which is contributing to her mood lability. Will readjust medications .  Daily contact with patient to assess and evaluate symptoms and progress in treatment and Medication management  Will reduce Prozac to 20 mg po daily for anxiety, depression. Pt feels restless on the high dose. Will reduce Remeron to 7.5 mg po qhs for sleep, Remeron is more sedating at lower dose. Will add Depakote 250 mg tid for anxiety, mood lability.Depakote level in 5 days- 05/02/15. Will add Abilify 2.5 mg po bid to augment the effect of her mood stabilizers. Will increase Gabapentin to 800 mg po qhs and continue 1000 mg tid for neuropathy. Will add Lidocaine patch for back pain. Will add Capsaicin topical tid for neuropathioc  pain.   Reviewed past medical records,treatment plan.  Will continue to monitor vitals ,medication compliance and treatment side effects while patient is here.  Will monitor for medical issues as well as call consult as needed.  Reviewed labs ,pt with low HB/HCT as well as MCV- Will get iron panel , vitamin b12 , also get lipid panel, TSH, PL - since pt is on neuroleptic. Will also place dietician reconsult , diabetic coordinator reconsult as well as spiritual consult. CSW will start working on disposition.  Patient to participate in therapeutic milieu .           Amrit Erck MD 04/28/2015, 2:54 PM

## 2015-04-28 NOTE — Progress Notes (Signed)
D. Pt present with depressed mood and sad affect on the unit today. As per self inventory, pt had poor sleep,fair appetite, low energy level and poor concentration. Pt goal for today "I need a different anti depressant, the one am on is working, am getting more depressed". Pt reported that her depression was a 10, her hopelessness was a 10, and that her anxiety was a 10. Pt reported being negative SI/HI, no AH/VH noted. A: 15 min checks continued for patient safety. R: Pts safety maintained.

## 2015-04-28 NOTE — Clinical Social Work Note (Addendum)
[facility]  Trula SladeHeather Smart, MSW, LCSW Clinical Social Worker 04/28/2015 8:28 AM    CSW spoke with Shayla. Blood glucose levels must be lower per [facility] at [facility]. [facility] No beds available today for females.  Trula SladeHeather Smart, MSW, LCSW Clinical Social Worker 04/28/2015 9:56 AM

## 2015-04-28 NOTE — [facility] Group Notes (Signed)
[facility] Group Notes:  (Counselor/Nursing/MHT/Case Management/Adjunct)  04/28/2015 1:15PM  Type of Therapy:  Group Therapy  Participation Level:  Active  Participation Quality:  Appropriate  Affect:  Flat  Cognitive:  Oriented  Insight:  Improving  Engagement in Group:  Limited  Engagement in Therapy:  Limited  Modes of Intervention:  Discussion, Exploration and Socialization  Summary of Progress/Problems: The topic for group was balance in life.  Pt participated in the discussion about when their life was in balance and out of balance and how this feels.  Pt discussed ways to get back in balance and short term goals they can work on to get where they want to be. Stayed the entire time.  Sat quietly.  Was not working on puzzle book today, and appeared to be engaged throughout.  Talked about her faith, and how that has gotten her through tough times.  States that she inherited that from her mother, who died a month ago.  Talked about "praying without ceasing."   Ida Rogueorth, Franko Hilliker B 04/28/2015 4:01 PM

## 2015-04-28 NOTE — [facility] Group Notes (Signed)
[facility] Group Notes:  (Nursing/MHT/Case Management/Adjunct)  Date:  04/28/2015  Time: 0930 Type of Therapy:  Nurse Education  Participation Level:  Active  Participation Quality:  Appropriate and Attentive  Affect:  Appropriate  Cognitive:  Alert and Appropriate  Insight:  Appropriate and Good  Engagement in Group:  Engaged  Modes of Intervention:  Activity, Discussion, Education, Exploration and Limit-setting  Summary of Progress/Problems: Topic was recovery. Discussed what recovery means to the individual and ways to ensure that the individual stays on the road to recovery. Group educated on the importance of medication adherence. Patient was attentive and receptive. Carolyn Ayala 04/28/2015, 6:35 PM

## 2015-04-28 NOTE — Progress Notes (Signed)
D: Pt presents with a depressed affect and mood. Pt is visible and active within the milieu. Pt reports that she abruptly left her meeting with the DM consultant due to her expectations not being met. Pt had a interest in learning about the techniques of insulin administration. Refer to Diabetes coordinator note in reference of their attempts to educate pt. Pt denied any SI/HI/AVH. Sugar packs were removed from pt's room.  A: Writer administered scheduled and prn medications to pt, per MD orders. Continued support and availability as needed was extended to this pt. Staff continues to monitor pt with q82mn checks.  R: No adverse drug reactions noted. Pt receptive to treatment. Pt verbally contracts for safety. Pt remains safe at this time.

## 2015-04-28 NOTE — Progress Notes (Signed)
Pt BCG at 1700 was 463 mg/dl. MD on call notified. Novolog sliding scale held as per MD order. Will continue to monitor.

## 2015-04-29 LAB — LIPID PANEL
Cholesterol: 233 mg/dL — ABNORMAL HIGH (ref 0–200)
HDL: 89 mg/dL (ref >40)
LDL Cholesterol: 132 mg/dL — ABNORMAL HIGH (ref 0–99)
Total CHOL/HDL Ratio: 2.6 RATIO
Triglycerides: 62 mg/dL (ref <150)
VLDL: 12 mg/dL (ref 0–40)

## 2015-04-29 LAB — IRON AND TIBC
Iron: 25 ug/dL — ABNORMAL LOW (ref 28–170)
Saturation Ratios: 5 % — ABNORMAL LOW (ref 10.4–31.8)
TIBC: 508 ug/dL — ABNORMAL HIGH (ref 250–450)
UIBC: 483 ug/dL

## 2015-04-29 LAB — GLUCOSE, CAPILLARY
Glucose-Capillary: 305 mg/dL — ABNORMAL HIGH (ref 65–99)
Glucose-Capillary: 138 mg/dL — ABNORMAL HIGH (ref 65–99)
Glucose-Capillary: 329 mg/dL — ABNORMAL HIGH (ref 65–99)
Glucose-Capillary: 191 mg/dL — ABNORMAL HIGH (ref 65–99)

## 2015-04-29 LAB — VITAMIN B12: Vitamin B-12: 681 pg/mL (ref 180–914)

## 2015-04-29 LAB — TSH: TSH: 2.82 u[IU]/mL (ref 0.350–4.500)

## 2015-04-29 LAB — FERRITIN: Ferritin: 3 ng/mL — ABNORMAL LOW (ref 11–307)

## 2015-04-29 MED ORDER — INSULIN ASPART 100 UNIT/ML ~~LOC~~ SOLN
0.0000 [IU] | Freq: Three times a day (TID) | SUBCUTANEOUS | Status: DC
  Administered 2015-04-29 – 2015-04-30 (×2): 15 [IU] via SUBCUTANEOUS
  Administered 2015-04-30: 20 [IU] via SUBCUTANEOUS
  Administered 2015-05-01: 11 [IU] via SUBCUTANEOUS
  Administered 2015-05-01: 7 [IU] via SUBCUTANEOUS

## 2015-04-29 MED ORDER — INSULIN ASPART 100 UNIT/ML ~~LOC~~ SOLN: 0.0000 [IU] | Freq: Every day | SUBCUTANEOUS | Status: DC

## 2015-04-29 MED ORDER — FERROUS SULFATE 325 (65 FE) MG PO TABS
325.0000 mg | ORAL_TABLET | Freq: Two times a day (BID) | ORAL | Status: DC
  Administered 2015-04-29 – 2015-05-01 (×4): 325 mg via ORAL
  Filled 2015-04-29 (×9): qty 1

## 2015-04-29 MED ORDER — ARIPIPRAZOLE 5 MG PO TABS
5.0000 mg | ORAL_TABLET | ORAL | Status: DC
  Administered 2015-04-29 – 2015-05-01 (×4): 5 mg via ORAL
  Filled 2015-04-29: qty 7
  Filled 2015-04-29: qty 1
  Filled 2015-04-29: qty 7
  Filled 2015-04-29 (×5): qty 1

## 2015-04-29 MED ORDER — INSULIN ASPART PROT & ASPART (70-30 MIX) 100 UNIT/ML ~~LOC~~ SUSP
42.0000 [IU] | Freq: Two times a day (BID) | SUBCUTANEOUS | Status: DC
  Administered 2015-04-29 – 2015-05-01 (×4): 42 [IU] via SUBCUTANEOUS

## 2015-04-29 NOTE — Progress Notes (Addendum)
[facility] MD Progress Note  04/29/2015 2:08 PM Carolyn Ayala  MRN:  [id number]   Subjective:  "I am still very anxious and restless , I am not sure if my medications are working."       Objective:Ashely Anistyn Ayala is a 39 y.o.AA Female who was admitted to [facility] due to having thoughts of killing herself since the death of her mother a month ago. Patient per initial notes in EHR " was feeling depressed since the death of her mother .Pt reported several plans on admission like - "  I could do several things like get a BB gun from [employer] to make the cops think I had a gun, inject myself with a massive dose of insulin, and step in front of a large truck. I have been drinking more to deal with the loss but when I sober up it is still there."  Patient seen today and chart reviewed.Discussed patient with treatment team.  Pt today continues to report anxiety as well as restlessness. Pt also per nursing continues to be having fluctuating CBGs , which has been very difficult to control . Diabetic coordinator has been managing her insulin very closely . Pt had medication readjustments yesterday - will continue to do the same. Pt had labs done , which also shows iron panel to be abnormal - which can also contribute to her ?RLS type sx. Pt also reports some GI sx- diarrhea - which is improving. Discussed with pt to let writer know , if it worsens . Will continue to encourage and support.     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 Uncontrolled diabetes mellitus with diabetic neuropathy, with long-term current use of insulin (HCC) [E11.40, Z79.4, E11.65] 04/21/2015  . PTSD (post-traumatic stress disorder) [F43.10] 04/20/2015  . Alcohol dependence with withdrawal, uncomplicated (HCC)  [F10.230] 04/20/2015  . Suicidal ideation [R45.851] 04/19/2015  . Recent bereavement [Z78.9] 04/19/2015  . MDD (major depressive disorder), recurrent severe, without psychosis (HCC) [F33.2] 04/19/2015  . Generalized anxiety disorder [F41.1] 02/13/2013  . Chronic posttraumatic stress disorder [F43.12] 02/13/2013  . Alcoholism with alcohol dependence (HCC) [F10.20] 02/08/2013  . Substance induced mood disorder (HCC) [F19.94] 02/08/2013  . IDDM (insulin dependent diabetes mellitus) (HCC) [E11.9, Z79.4] 02/08/2013   Total Time spent with patient: 30 minutes  Past Psychiatric History: see Admission H and P  Past Medical History:  Past Medical History  Diagnosis Date  . Mental disorder   . Depression   . Diabetes mellitus without complication (HCC)   . Hypertension     Past Surgical History  Procedure Laterality Date  . Cesarean section     Family History:  Family History  Problem Relation Age of Onset  . Depression Mother    Family Psychiatric  History: see Admission H and P Social History:  History  Alcohol Use  . Yes    Comment: daily     History  Drug Use No    Social History   Social History  . Marital Status: Single    Spouse Name: N/A  . Number of Children: N/A  . Years of Education: N/A   Social History Main Topics  . Smoking status: Current Every Day Smoker -- 1.00 packs/day    Types: Cigarettes  . Smokeless tobacco: Current User  . Alcohol Use: Yes  Comment: daily  . Drug Use: No  . Sexual Activity: No   Other Topics Concern  . None   Social History Narrative   ** Merged History Encounter **       Additional Social History:    Pain Medications: flexerilfor leg neuropathy 2 days ago Prescriptions: Insulin Levamir 60 units 2/D, Novalog befoe each meal sliding scale, Lisinopril  once daily, Gabapention 3x/D prn History of alcohol / drug use?: Yes Name of Substance 1: ETOH 1 - Age of First Use: 39 years of age 34 - Amount (size/oz): 2 40  oz beers daily 1 - Frequency: daily x 1 month 1 - Duration: since mother died a month ago 1 - Last Use / Amount: 2 days ago    Sleep: Fair  Appetite:  Fair  Current Medications: Current Facility-Administered Medications  Medication Dose Route Frequency Provider Last Rate Last Dose  . acetaminophen (TYLENOL) tablet 650 mg  650 mg Oral Q6H PRN Oneta Rack, NP   650 mg at 04/21/15 0804  . alum & mag hydroxide-simeth (MAALOX/MYLANTA) 200-200-20 MG/5ML suspension 30 mL  30 mL Oral Q4H PRN Oneta Rack, NP      . ARIPiprazole (ABILIFY) tablet 2.5 mg  2.5 mg Oral BH-qamhs Jomarie Longs, MD   2.5 mg at 04/29/15 0823  . benzocaine (ORAJEL) 10 % mucosal gel   Mouth/Throat TID PRN Oneta Rack, NP      . capsaicin (ZOSTRIX) 0.025 % cream   Topical BID Jomarie Longs, MD      . carisoprodol (SOMA) tablet 350 mg  350 mg Oral QID PRN Rachael Fee, MD   350 mg at 04/29/15 0830  . divalproex (DEPAKOTE) DR tablet 250 mg  250 mg Oral 3 times per day Jomarie Longs, MD   250 mg at 04/29/15 0981  . feeding supplement (GLUCERNA SHAKE) (GLUCERNA SHAKE) liquid 237 mL  237 mL Oral BID BM Anderson Malta [place], RD   237 mL at 04/28/15 1006  . ferrous sulfate tablet 325 mg  325 mg Oral BID WC Jazon Jipson, MD      . FLUoxetine (PROZAC) capsule 20 mg  20 mg Oral Daily Jomarie Longs, MD   20 mg at 04/29/15 0823  . gabapentin (NEURONTIN) capsule 1,000 mg  1,000 mg Oral TID Rachael Fee, MD   1,000 mg at 04/29/15 1151  . gabapentin (NEURONTIN) capsule 800 mg  800 mg Oral QHS Terease Marcotte, MD   800 mg at 04/28/15 2127  . hydrOXYzine (ATARAX/VISTARIL) tablet 50 mg  50 mg Oral TID PRN Jomarie Longs, MD   50 mg at 04/29/15 0627  . ibuprofen (ADVIL,MOTRIN) tablet 800 mg  800 mg Oral Q6H PRN Rachael Fee, MD   800 mg at 04/27/15 1150  . Influenza vac split quadrivalent PF (FLUARIX) injection 0.5 mL  0.5 mL Intramuscular Tomorrow-1000 Beau Fanny, FNP   0.5 mL at 04/20/15 1346  . insulin aspart (novoLOG)  injection 0-15 Units  0-15 Units Subcutaneous TID WC Oneta Rack, NP   2 Units at 04/29/15 1207  . insulin aspart (novoLOG) injection 0-5 Units  0-5 Units Subcutaneous QHS Oneta Rack, NP   5 Units at 04/28/15 2132  . insulin aspart protamine- aspart (NOVOLOG MIX 70/30) injection 40 Units  40 Units Subcutaneous BID WC Jomarie Longs, MD   40 Units at 04/29/15 0833  . lidocaine (LIDODERM) 5 % 1 patch  1 patch Transdermal Daily Jomarie Longs, MD   1  patch at 04/28/15 1302  . lisinopril (PRINIVIL,ZESTRIL) tablet 5 mg  5 mg Oral Daily Oneta Rack, NP   5 mg at 04/29/15 1610  . loperamide (IMODIUM) capsule 4 mg  4 mg Oral PRN Rachael Fee, MD   4 mg at 04/29/15 0136  . loratadine (CLARITIN) tablet 10 mg  10 mg Oral q morning - 10a Oneta Rack, NP   10 mg at 04/29/15 1006  . magnesium hydroxide (MILK OF MAGNESIA) suspension 30 mL  30 mL Oral Daily PRN Oneta Rack, NP      . metFORMIN (GLUCOPHAGE) tablet 1,000 mg  1,000 mg Oral BID WC Rachael Fee, MD   1,000 mg at 04/26/15 0753  . mirtazapine (REMERON) tablet 7.5 mg  7.5 mg Oral QHS Jomarie Longs, MD   7.5 mg at 04/28/15 2133  . multivitamin with minerals tablet 1 tablet  1 tablet Oral Daily Worthy Flank, NP   1 tablet at 04/29/15 9604  . pneumococcal 23 valent vaccine (PNU-IMMUNE) injection 0.5 mL  0.5 mL Intramuscular Tomorrow-1000 Beau Fanny, FNP   0.5 mL at 04/20/15 1346  . thiamine (B-1) injection 100 mg  100 mg Intramuscular Once Worthy Flank, NP   100 mg at 04/19/15 2145  . thiamine (VITAMIN B-1) tablet 100 mg  100 mg Oral Daily Worthy Flank, NP   100 mg at 04/29/15 5409    Lab Results:  Results for orders placed or performed during the hospital encounter of 04/19/15 (from the past 48 hour(s))  Glucose, capillary     Status: Abnormal   Collection Time: 04/27/15  4:57 PM  Result Value Ref Range   Glucose-Capillary 211 (H) 65 - 99 mg/dL   Comment 1 Notify RN    Comment 2 Document in Chart   Glucose, capillary      Status: Abnormal   Collection Time: 04/27/15  8:40 PM  Result Value Ref Range   Glucose-Capillary 182 (H) 65 - 99 mg/dL   Comment 1 Notify RN   Glucose, capillary     Status: Abnormal   Collection Time: 04/27/15 11:59 PM  Result Value Ref Range   Glucose-Capillary 285 (H) 65 - 99 mg/dL   Comment 1 Notify RN   Glucose, capillary     Status: Abnormal   Collection Time: 04/28/15  6:14 AM  Result Value Ref Range   Glucose-Capillary 269 (H) 65 - 99 mg/dL  Glucose, capillary     Status: Abnormal   Collection Time: 04/28/15 11:52 AM  Result Value Ref Range   Glucose-Capillary 270 (H) 65 - 99 mg/dL  Glucose, capillary     Status: Abnormal   Collection Time: 04/28/15  4:56 PM  Result Value Ref Range   Glucose-Capillary 463 (H) 65 - 99 mg/dL  Glucose, capillary     Status: Abnormal   Collection Time: 04/28/15  7:56 PM  Result Value Ref Range   Glucose-Capillary 303 (H) 65 - 99 mg/dL   Comment 1 Notify RN   Glucose, capillary     Status: Abnormal   Collection Time: 04/29/15  6:00 AM  Result Value Ref Range   Glucose-Capillary 305 (H) 65 - 99 mg/dL   Comment 1 Notify RN   TSH     Status: None   Collection Time: 04/29/15  6:20 AM  Result Value Ref Range   TSH 2.820 0.350 - 4.500 uIU/mL    Comment: Performed at [facility]  Lipid panel  Status: Abnormal   Collection Time: 04/29/15  6:20 AM  Result Value Ref Range   Cholesterol 233 (H) 0 - 200 mg/dL   Triglycerides 62 <562 mg/dL   HDL 89 >13 mg/dL   Total CHOL/HDL Ratio 2.6 RATIO   VLDL 12 0 - 40 mg/dL   LDL Cholesterol 086 (H) 0 - 99 mg/dL    Comment:        Total Cholesterol/HDL:CHD Risk Coronary Heart Disease Risk Table                     Men   Women  1/2 Average Risk   3.4   3.3  Average Risk       5.0   4.4  2 X Average Risk   9.6   7.1  3 X Average Risk  23.4   11.0        Use the calculated Patient Ratio above and the CHD Risk Table to determine the patient's CHD Risk.        ATP III  CLASSIFICATION (LDL):  <100     mg/dL   Optimal  578-469  mg/dL   Near or Above                    Optimal  130-159  mg/dL   Borderline  629-528  mg/dL   High  >413     mg/dL   Very High Performed at [facility]   Vitamin B12     Status: None   Collection Time: 04/29/15  6:20 AM  Result Value Ref Range   Vitamin B-12 681 180 - 914 pg/mL    Comment: (NOTE) This assay is not validated for testing neonatal or myeloproliferative syndrome specimens for Vitamin B12 levels. Performed at [facility]   Iron and TIBC     Status: Abnormal   Collection Time: 04/29/15  6:20 AM  Result Value Ref Range   Iron 25 (L) 28 - 170 ug/dL   TIBC 244 (H) 010 - 272 ug/dL   Saturation Ratios 5 (L) 10.4 - 31.8 %   UIBC 483 ug/dL    Comment: Performed at [facility]  Ferritin     Status: Abnormal   Collection Time: 04/29/15  6:20 AM  Result Value Ref Range   Ferritin 3 (L) 11 - 307 ng/mL    Comment: Performed at [facility]  Glucose, capillary     Status: Abnormal   Collection Time: 04/29/15 11:55 AM  Result Value Ref Range   Glucose-Capillary 138 (H) 65 - 99 mg/dL    Physical Findings: AIMS: Facial and Oral Movements Muscles of Facial Expression: None, normal Lips and Perioral Area: None, normal Jaw: None, normal Tongue: None, normal,Extremity Movements Upper (arms, wrists, hands, fingers): None, normal Lower (legs, knees, ankles, toes): None, normal, Trunk Movements Neck, shoulders, hips: None, normal, Overall Severity Severity of abnormal movements (highest score from questions above): None, normal Incapacitation due to abnormal movements: None, normal Patient's awareness of abnormal movements (rate only patient's report): No Awareness, Dental Status Current problems with teeth and/or dentures?: No Does patient usually wear dentures?: No  CIWA:  CIWA-Ar Total: 2 COWS:  COWS Total Score: 3  Musculoskeletal: Strength & Muscle Tone: within normal  limits Gait & Station: normal Patient leans: normal  Psychiatric Specialty Exam: Review of Systems  Constitutional: Malaise/fatigue: deneis fatigue.  HENT: Negative.   Eyes: Negative.  Negative for double vision.  Respiratory: Negative.  Cardiovascular: Negative.   Gastrointestinal: Negative.   Genitourinary: Negative.   Musculoskeletal: Negative for joint pain (denies joint aches/or pain today).       Aches pains, legs pains  Skin: Negative.   Neurological: Negative.  Negative for headaches.  Endo/Heme/Allergies: Negative.   Psychiatric/Behavioral: Positive for depression and substance abuse. Negative for suicidal ideas. The patient is nervous/anxious.     Blood pressure 117/89, pulse 103, temperature 98.3 F (36.8 C), temperature source Oral, resp. rate 16, height 5' 2.01" (1.575 m), weight 77.565 kg (171 lb), last menstrual period 03/25/2015.Body mass index is 31.27 kg/(m^2).  General Appearance: Casual  Eye Contact::  Good  Speech:  Normal Rate  Volume:  Normal  Mood:  Anxious  Affect:  Labile.  Thought Process:  Goal Directed  Orientation:  Full (Time, Place, and Person)  Thought Content:  Rumination  Suicidal Thoughts:  No   Homicidal Thoughts:  No  Memory:  Immediate;   Fair Recent;   Fair Remote;   Fair  Judgement:  Fair  Insight:  Lacking  Psychomotor Activity:  Restlessness  Concentration:  Poor  Recall:  [employer]Fair  Fund of Knowledge:Fair  Language: Fair  Akathisia:  No  Handed:  Right  AIMS (if indicated):     Assets:  Desire for Improvement Social Support Vocational/Educational  ADL's:  Intact  Cognition: WNL  Sleep:  Number of Hours: 4.25    Treatment Plan Summary:Patient continues to be restless , anxious , struggles with grief after the death of her mother. She also has coexisting medical issues like uncontrolled DM , neuropathy which is contributing to her mood lability.Her Iron panel shows - abnormalities - will replace and continue other  medications.  Daily contact with patient to assess and evaluate symptoms and progress in treatment and Medication management  Will continue Prozac  20 mg po daily for anxiety, depression. Pt feels restless on the high dose. Will continue Remeron  7.5 mg po qhs for sleep, Remeron is more sedating at lower dose. Will continue Depakote 250 mg tid for anxiety, mood lability.Depakote level in 5 days- 05/02/15. Will increase Abilify to 5 mg po bid to augment the effect of her mood stabilizers. Will continue Gabapentin to 800 mg po qhs and continue 1000 mg tid for neuropathy. Will continue  Lidocaine patch for back pain. Will continue Capsaicin topical tid for neuropathioc pain.  Will add Ferrous sulphate 325 mg po bid with meals.  Reviewed past medical records,treatment plan.  Will continue to monitor vitals ,medication compliance and treatment side effects while patient is here.  Will monitor for medical issues as well as call consult as needed.  Reviewed labs ,vit b12- wnl , lipid panel - hyperlipidemia - pt already had dietician consult . Will continue diabetic coordinator recommendations for her CBGs. Recreational therapist consult. CSW will start working on disposition.  Patient to participate in therapeutic milieu .           Mckenize Mezera MD 04/29/2015, 2:08 PM

## 2015-04-29 NOTE — [facility] Group Notes (Signed)
[facility] LCSW Aftercare Discharge Planning Group Note   04/29/2015 10:34 AM  Participation Quality:  Active  Mood/Affect:  Appropriate  Depression Rating:  10  Anxiety Rating:  10  Thoughts of Suicide:  No Will you contract for safety?   NA  Current AVH:  No  Plan for Discharge/Comments:  Pt reported that she did not sleep well last night but that she was feeling ok today. However, pt still presents with a depressed affect and mood and continues to rate her depression and anxiety highly. CSW is working on possible [place] referral and considering Dorisann FramesRJ Blackley as a back-up option.  Transportation Means:   Supports:  Jonathon JordanLynn B Bryant

## 2015-04-29 NOTE — Progress Notes (Signed)
Patient ID: Carolyn Ayala, female   DOB: [birth year], 39 y.o.   MRN: [id number] D  ---   Pt states no pain at this time.   She is pleasant and cooperative to staff and shows no negative behaviors.  She agrees to contract for safety.    Pt. Takes all medications with no sign of adverse effects.   Pt.  requested that this writer teach her about her Diabetic condition and asked to be shown how to draw up and give herself insulin,  "  So I will be able to take care of myself after I leave here ".    Writer agreed to assist pt. As requested.   Pt. Appears eager to  Be educated on Diabeties and requested print outs on the topic.   Pt. shows no signs of Paranoia this shift.   --- A ---  meds and education provided.  --- R  --  Pt. Remains safe and pleasant on unit

## 2015-04-29 NOTE — [facility] Group Notes (Signed)
[facility] LCSW Group Therapy  04/29/2015 3:05 PM  Type of Therapy: Group Therapy  Participation Level: Invited. Chose not to attend.  Summary of Progress/Problems: Onalee HuaDavid from [employer] was here to tell his story of recovery and play his guitar.  Vito BackersLynn B. Beverely PaceBryant 04/29/2015 3:05 PM

## 2015-04-29 NOTE — Progress Notes (Signed)
D:Carolyn Ayala rates pain, depression and anxiety all as 10/10. She complains of lower back pain as well as neuropathy in both feet. She states her mother died 1338 days ago and she was recently diagnosed with diabetes and they issues have causes major stress, depression and anxiety in her life. She endorses passive SI. Denies HI/AVH. She also states the vistaril has not been helping with her anxiety. We discussed her diabetes and more effective ways of coping besides medications. Encouraged her to continue to attend groups to learn more effective coping skills.  A: Encouragement and support given.  R: Continue to monitor for patient safety and medication effectiveness.

## 2015-04-29 NOTE — Progress Notes (Signed)
Adult Psychoeducational Group Note  Date:  04/29/2015 Time:  8:24 PM  Group Topic/Focus:  Wrap-Up Group:   The focus of this group is to help patients review their daily goal of treatment and discuss progress on daily workbooks.  Participation Level:  Active  Participation Quality:  Appropriate  Affect:  Appropriate  Cognitive:  Appropriate  Insight: Appropriate  Engagement in Group:  Engaged  Modes of Intervention:  Discussion  Additional Comments: The patient expressed that she attended group.The patient also said that in group they talk about coping skills after discharge.  Octavio Mannshigpen, Takeem Krotzer Lee 04/29/2015, 8:24 PM

## 2015-04-30 DIAGNOSIS — Z794 Long term (current) use of insulin: Secondary | ICD-10-CM

## 2015-04-30 DIAGNOSIS — E119 Type 2 diabetes mellitus without complications: Secondary | ICD-10-CM

## 2015-04-30 DIAGNOSIS — E0842 Diabetes mellitus due to underlying condition with diabetic polyneuropathy: Secondary | ICD-10-CM

## 2015-04-30 DIAGNOSIS — F4310 Post-traumatic stress disorder, unspecified: Secondary | ICD-10-CM

## 2015-04-30 LAB — GLUCOSE, CAPILLARY: Glucose-Capillary: 378 mg/dL — ABNORMAL HIGH (ref 65–99)

## 2015-04-30 LAB — PROLACTIN: Prolactin: 10.7 ng/mL (ref 4.8–23.3)

## 2015-04-30 MED ORDER — BLOOD GLUCOSE MONITOR KIT: PACK | Status: DC

## 2015-04-30 NOTE — Progress Notes (Signed)
[facility] MD Progress Note  04/30/2015 11:55 AM Carolyn Ayala  MRN:  [id number]   Subjective:  "I am still anxious and depressed, don't worry about me. I am suicidal , if I walk out of here I am going to kill myself , I will find a way to do it."        Objective:Carolyn Ayala is a 39 y.o.AA Female who was admitted to [facility] due to having thoughts of killing herself since the death of her mother a month ago. Patient per initial notes in EHR " was feeling depressed since the death of her mother .Pt reported several plans on admission like - "  I could do several things like get a BB gun from [employer] to make the cops think I had a gun, inject myself with a massive dose of insulin, and step in front of a large truck. I have been drinking more to deal with the loss but when I sober up it is still there."  Patient seen today and chart reviewed.Discussed patient with treatment team.  Pt today reports that she suicidal with a plan to kill self. Pt had several plans on admission - as listed above - including injecting self with a massive dose of insulin. Pt has brittle diabetes , which is poorly controlled . Pt needs to be on Insulin at this time , which makes it harder , since pt will be discharged on insulin and she will have access to it . Pt continues to struggle with mood lability, anxiety, restlessness as well as grief from the death of her mother who passed away a month ago. Pt was very close to her mom and feels she cannot live without her support. Pt seen as tearful , and very anxious this AM. Pt advised to make use of her prn medications , while writer readjust her medications.  Pt advised to monitor her meals as well as hospitalist consult to be placed for the same. Diabetic coordinator has been working close to her .Marland Kitchen Pt also reports some GI sx- diarrhea - which is improving. She has been refusing her Metformin due to that. Will continue to encourage and  support.     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 Uncontrolled diabetes mellitus with diabetic neuropathy, with long-term current use of insulin (HCC) [E11.40, Z79.4, E11.65] 04/21/2015  . PTSD (post-traumatic stress disorder) [F43.10] 04/20/2015  . Alcohol dependence with withdrawal, uncomplicated (HCC) [F10.230] 04/20/2015  . Suicidal ideation [R45.851] 04/19/2015  . Recent bereavement [Z78.9] 04/19/2015  . MDD (major depressive disorder), recurrent severe, without psychosis (HCC) [F33.2] 04/19/2015  . Generalized anxiety disorder [F41.1] 02/13/2013  . Chronic posttraumatic stress disorder [F43.12] 02/13/2013  . Alcoholism with alcohol dependence (HCC) [F10.20] 02/08/2013  . Substance induced mood disorder (HCC) [F19.94] 02/08/2013  . IDDM (insulin dependent diabetes mellitus) (HCC) [E11.9, Z79.4] 02/08/2013   Total Time spent with patient: 30 minutes  Past Psychiatric History: see Admission H and P  Past Medical History:  Past Medical History  Diagnosis Date  . Mental disorder   . Depression   . Diabetes mellitus without complication (HCC)   . Hypertension     Past Surgical History  Procedure Laterality Date  . Cesarean section     Family History:  Family History  Problem Relation Age of Onset  .  Depression Mother    Family Psychiatric  History: see Admission H and P Social History:  History  Alcohol Use  . Yes    Comment: daily     History  Drug Use No    Social History   Social History  . Marital Status: Single    Spouse Name: N/A  . Number of Children: N/A  . Years of Education: N/A   Social History Main Topics  . Smoking status: Current Every Day Smoker -- 1.00 packs/day    Types: Cigarettes  . Smokeless tobacco: Current User  . Alcohol Use:  Yes     Comment: daily  . Drug Use: No  . Sexual Activity: No   Other Topics Concern  . None   Social History Narrative   ** Merged History Encounter **       Additional Social History:    Pain Medications: flexerilfor leg neuropathy 2 days ago Prescriptions: Insulin Levamir 60 units 2/D, Novalog befoe each meal sliding scale, Lisinopril 10mg  once daily, Gabapention 3x/D prn History of alcohol / drug use?: Yes Name of Substance 1: ETOH 1 - Age of First Use: 39 years of age 79 - Amount (size/oz): 2 40 oz beers daily 1 - Frequency: daily x 1 month 1 - Duration: since mother died a month ago 1 - Last Use / Amount: 2 days ago    Sleep: Fair  Appetite:  Fair  Current Medications: Current Facility-Administered Medications  Medication Dose Route Frequency Provider Last Rate Last Dose  . acetaminophen (TYLENOL) tablet 650 mg  650 mg Oral Q6H PRN Oneta Rack, NP   650 mg at 04/21/15 0804  . alum & mag hydroxide-simeth (MAALOX/MYLANTA) 200-200-20 MG/5ML suspension 30 mL  30 mL Oral Q4H PRN Oneta Rack, NP      . ARIPiprazole (ABILIFY) tablet 5 mg  5 mg Oral BH-qamhs Jomarie Longs, MD   5 mg at 04/30/15 0811  . benzocaine (ORAJEL) 10 % mucosal gel   Mouth/Throat TID PRN Oneta Rack, NP      . capsaicin (ZOSTRIX) 0.025 % cream   Topical BID Jomarie Longs, MD      . carisoprodol (SOMA) tablet 350 mg  350 mg Oral QID PRN Rachael Fee, MD   350 mg at 04/30/15 0816  . divalproex (DEPAKOTE) DR tablet 250 mg  250 mg Oral 3 times per day Jomarie Longs, MD   250 mg at 04/30/15 0622  . feeding supplement (GLUCERNA SHAKE) (GLUCERNA SHAKE) liquid 237 mL  237 mL Oral BID BM Anderson Malta [place], RD   237 mL at 04/30/15 1037  . ferrous sulfate tablet 325 mg  325 mg Oral BID WC Jomarie Longs, MD   325 mg at 04/30/15 0811  . FLUoxetine (PROZAC) capsule 20 mg  20 mg Oral Daily Jomarie Longs, MD   20 mg at 04/30/15 0811  . gabapentin (NEURONTIN) capsule 1,000 mg  1,000 mg Oral TID Rachael Fee, MD   1,000 mg at 04/30/15 1610  . gabapentin (NEURONTIN) capsule 800 mg  800 mg Oral QHS Greenlee Ancheta, MD   800 mg at 04/29/15 2146  . hydrOXYzine (ATARAX/VISTARIL) tablet 50 mg  50 mg Oral TID PRN Jomarie Longs, MD   50 mg at 04/30/15 0622  . ibuprofen (ADVIL,MOTRIN) tablet 800 mg  800 mg Oral Q6H PRN Rachael Fee, MD   800 mg at 04/29/15 2214  . Influenza vac split quadrivalent PF (FLUARIX) injection 0.5 mL  0.5  mL Intramuscular Tomorrow-1000 Beau Fanny, FNP   0.5 mL at 04/20/15 1346  . insulin aspart (novoLOG) injection 0-20 Units  0-20 Units Subcutaneous TID WC Beau Fanny, FNP   20 Units at 04/30/15 [phone]  . insulin aspart (novoLOG) injection 0-5 Units  0-5 Units Subcutaneous QHS Beau Fanny, FNP   0 Units at 04/29/15 2200  . insulin aspart protamine- aspart (NOVOLOG MIX 70/30) injection 42 Units  42 Units Subcutaneous BID WC Beau Fanny, FNP   42 Units at 04/30/15 0820  . lidocaine (LIDODERM) 5 % 1 patch  1 patch Transdermal Daily Jomarie Longs, MD   1 patch at 04/30/15 1037  . lisinopril (PRINIVIL,ZESTRIL) tablet 5 mg  5 mg Oral Daily Oneta Rack, NP   5 mg at 04/30/15 0811  . loperamide (IMODIUM) capsule 4 mg  4 mg Oral PRN Rachael Fee, MD   4 mg at 04/30/15 9604  . loratadine (CLARITIN) tablet 10 mg  10 mg Oral q morning - 10a Oneta Rack, NP   10 mg at 04/30/15 1038  . magnesium hydroxide (MILK OF MAGNESIA) suspension 30 mL  30 mL Oral Daily PRN Oneta Rack, NP      . metFORMIN (GLUCOPHAGE) tablet 1,000 mg  1,000 mg Oral BID WC Rachael Fee, MD   1,000 mg at 04/26/15 0753  . mirtazapine (REMERON) tablet 7.5 mg  7.5 mg Oral QHS Jomarie Longs, MD   7.5 mg at 04/28/15 2133  . multivitamin with minerals tablet 1 tablet  1 tablet Oral Daily Worthy Flank, NP   1 tablet at 04/30/15 [id number]  . pneumococcal 23 valent vaccine (PNU-IMMUNE) injection 0.5 mL  0.5 mL Intramuscular Tomorrow-1000 Beau Fanny, FNP   0.5 mL at 04/20/15 1346  . thiamine (B-1) injection  100 mg  100 mg Intramuscular Once Worthy Flank, NP   100 mg at 04/19/15 2145  . thiamine (VITAMIN B-1) tablet 100 mg  100 mg Oral Daily Worthy Flank, NP   100 mg at 04/30/15 8119    Lab Results:  Results for orders placed or performed during the hospital encounter of 04/19/15 (from the past 48 hour(s))  Glucose, capillary     Status: Abnormal   Collection Time: 04/28/15  4:56 PM  Result Value Ref Range   Glucose-Capillary 463 (H) 65 - 99 mg/dL  Prolactin     Status: None   Collection Time: 04/28/15  7:10 PM  Result Value Ref Range   Prolactin 10.7 4.8 - 23.3 ng/mL    Comment: (NOTE) Performed At: [facility] [address] [place], [place] [id number] Mila Homer MD ZH:[id number] Performed at [facility]   Glucose, capillary     Status: Abnormal   Collection Time: 04/28/15  7:56 PM  Result Value Ref Range   Glucose-Capillary 303 (H) 65 - 99 mg/dL   Comment 1 Notify RN   Glucose, capillary     Status: Abnormal   Collection Time: 04/29/15  6:00 AM  Result Value Ref Range   Glucose-Capillary 305 (H) 65 - 99 mg/dL   Comment 1 Notify RN   TSH     Status: None   Collection Time: 04/29/15  6:20 AM  Result Value Ref Range   TSH 2.820 0.350 - 4.500 uIU/mL    Comment: Performed at [facility]  Lipid panel     Status: Abnormal   Collection Time: 04/29/15  6:20 AM  Result  Value Ref Range   Cholesterol 233 (H) 0 - 200 mg/dL   Triglycerides 62 <161 mg/dL   HDL 89 >09 mg/dL   Total CHOL/HDL Ratio 2.6 RATIO   VLDL 12 0 - 40 mg/dL   LDL Cholesterol 604 (H) 0 - 99 mg/dL    Comment:        Total Cholesterol/HDL:CHD Risk Coronary Heart Disease Risk Table                     Men   Women  1/2 Average Risk   3.4   3.3  Average Risk       5.0   4.4  2 X Average Risk   9.6   7.1  3 X Average Risk  23.4   11.0        Use the calculated Patient Ratio above and the CHD Risk Table to determine the patient's CHD Risk.        ATP III  CLASSIFICATION (LDL):  <100     mg/dL   Optimal  540-981  mg/dL   Near or Above                    Optimal  130-159  mg/dL   Borderline  191-478  mg/dL   High  >295     mg/dL   Very High Performed at [facility]   Vitamin B12     Status: None   Collection Time: 04/29/15  6:20 AM  Result Value Ref Range   Vitamin B-12 681 180 - 914 pg/mL    Comment: (NOTE) This assay is not validated for testing neonatal or myeloproliferative syndrome specimens for Vitamin B12 levels. Performed at [facility]   Iron and TIBC     Status: Abnormal   Collection Time: 04/29/15  6:20 AM  Result Value Ref Range   Iron 25 (L) 28 - 170 ug/dL   TIBC 621 (H) 308 - 657 ug/dL   Saturation Ratios 5 (L) 10.4 - 31.8 %   UIBC 483 ug/dL    Comment: Performed at [facility]  Ferritin     Status: Abnormal   Collection Time: 04/29/15  6:20 AM  Result Value Ref Range   Ferritin 3 (L) 11 - 307 ng/mL    Comment: Performed at [facility]  Glucose, capillary     Status: Abnormal   Collection Time: 04/29/15 11:55 AM  Result Value Ref Range   Glucose-Capillary 138 (H) 65 - 99 mg/dL  Glucose, capillary     Status: Abnormal   Collection Time: 04/29/15  4:54 PM  Result Value Ref Range   Glucose-Capillary 329 (H) 65 - 99 mg/dL  Glucose, capillary     Status: Abnormal   Collection Time: 04/29/15  8:20 PM  Result Value Ref Range   Glucose-Capillary 191 (H) 65 - 99 mg/dL   Comment 1 Notify RN   Glucose, capillary     Status: Abnormal   Collection Time: 04/30/15  5:57 AM  Result Value Ref Range   Glucose-Capillary 378 (H) 65 - 99 mg/dL   Comment 1 Notify RN     Physical Findings: AIMS: Facial and Oral Movements Muscles of Facial Expression: None, normal Lips and Perioral Area: None, normal Jaw: None, normal Tongue: None, normal,Extremity Movements Upper (arms, wrists, hands, fingers): None, normal Lower (legs, knees, ankles, toes): None, normal, Trunk Movements Neck, shoulders,  hips: None, normal, Overall Severity Severity of abnormal movements (highest score  from questions above): None, normal Incapacitation due to abnormal movements: None, normal Patient's awareness of abnormal movements (rate only patient's report): No Awareness, Dental Status Current problems with teeth and/or dentures?: No Does patient usually wear dentures?: No  CIWA:  CIWA-Ar Total: 6 COWS:  COWS Total Score: 5  Musculoskeletal: Strength & Muscle Tone: within normal limits Gait & Station: normal Patient leans: normal  Psychiatric Specialty Exam: Review of Systems  Constitutional: Malaise/fatigue: deneis fatigue.  HENT: Negative.   Eyes: Negative.  Negative for double vision.  Respiratory: Negative.   Cardiovascular: Negative.   Gastrointestinal: Negative.   Genitourinary: Negative.   Musculoskeletal: Negative for joint pain (denies joint aches/or pain today).       Aches pains, legs pains  Skin: Negative.   Neurological: Negative.  Negative for headaches.  Endo/Heme/Allergies: Negative.   Psychiatric/Behavioral: Positive for depression, suicidal ideas and substance abuse. The patient is nervous/anxious.     Blood pressure 116/91, pulse 108, temperature 97.3 F (36.3 C), temperature source Oral, resp. rate 20, height 5' 2.01" (1.575 m), weight 77.565 kg (171 lb), last menstrual period 03/25/2015.Body mass index is 31.27 kg/(m^2).  General Appearance: Casual  Eye Contact::  Good  Speech:  Normal Rate  Volume:  Normal  Mood:  Anxious, Depressed, Dysphoric, Hopeless, Irritable and Worthless  Affect:  Labile and Tearful.  Thought Process:  Goal Directed  Orientation:  Full (Time, Place, and Person)  Thought Content:  Rumination  Suicidal Thoughts:  Yes.  with intent/plan   Homicidal Thoughts:  No  Memory:  Immediate;   Fair Recent;   Fair Remote;   Fair  Judgement:  Fair  Insight:  Lacking  Psychomotor Activity:  Restlessness  Concentration:  Poor  Recall:  [employer] of  Knowledge:Fair  Language: Fair  Akathisia:  No  Handed:  Right  AIMS (if indicated):     Assets:  Desire for Improvement Social Support Vocational/Educational  ADL's:  Intact  Cognition: WNL  Sleep:  Number of Hours: 5.5    Treatment Plan Summary:Patient continues to be restless , anxious , struggles with grief after the death of her mother. She also has coexisting medical issues like uncontrolled DM , neuropathy which is contributing to her mood lability.She continues to be suicidal today with several plans - one of them being overdosing on her Insulin. Pt to be referred to [facility] for higher level of care.  Daily contact with patient to assess and evaluate symptoms and progress in treatment and Medication management  Will continue Prozac  20 mg po daily for anxiety, depression. Pt feels restless on the high dose. Will continue Remeron  7.5 mg po qhs for sleep, Remeron is more sedating at lower dose. Will continue Depakote 250 mg tid for anxiety, mood lability.Depakote level in 5 days- 05/02/15. Increase Abilify to 5 mg po bid to augment the effect of her mood stabilizers. Her medications are being titrated slowly due to her coexisting GI issues. Will continue Gabapentin to 800 mg po qhs and continue 1000 mg tid for neuropathy. Will continue  Lidocaine patch for back pain. Will continue Capsaicin topical tid for neuropathioc pain.  Will continue Ferrous sulphate 325 mg po bid with meals.  Reviewed past medical records,treatment plan.  Will continue to monitor vitals ,medication compliance and treatment side effects while patient is here.  Will monitor for medical issues as well as call consult as needed.  Will hold Metformin , and consult Hospitalist. Alcario Drought NP aware of it . Recreational therapist  consult. CSW will start working on disposition.  Patient to participate in therapeutic milieu .           Anea Fodera MD 04/30/2015, 11:55 AM

## 2015-04-30 NOTE — Progress Notes (Signed)
[facility] Group Notes:  (Nursing/MHT/Case Management/Adjunct)  Date:  04/30/2015  Time:  9:44 PM  Type of Therapy:  Psychoeducational Skills  Participation Level:  Active  Participation Quality:  Appropriate  Affect:  Appropriate  Cognitive:  Appropriate  Insight:  Appropriate  Engagement in Group:  Engaged  Modes of Intervention:  Education  Summary of Progress/Problems: The patient shared in group that she had a "pretty good" day. She stated that she enjoyed attending the groups and that she is ready to be discharged. The patient's goal for tomorrow is to work on waiting for her long-term treatment bed and attend all of her groups.   Carolyn Ayala, Raneen Jaffer S 04/30/2015, 9:44 PM

## 2015-04-30 NOTE — Progress Notes (Signed)
D. Pt present with sad affect and depressed mood on the unit today. Pt has been visible in the dayroom, but has not been interacting much. Pt attended groups and took her medication as scheduled. Pt monitored during meals as ordered. Pt had roast pork and mixed vegetables for lunch, ate 70% of her lunch. Pt got agitated when she was told what to pick for lunch but was easily redirected. [name] safety ensured with 15 minute and environmental checks. Pt currently denies SI/HI and A/V hallucinations. Pt verbally agrees to seek staff if SI/HI or A/VH occurs and to consult with staff before acting on these thoughts. Will continue POC.

## 2015-04-30 NOTE — Clinical Social Work Note (Signed)
Blood sugar still too high for [facility] to accept pt. [facility] referral faxed at 12:30PM on 04/30/15.  Trula SladeHeather Smart, MSW, LCSW Clinical Social Worker 04/30/2015 12:36 PM

## 2015-04-30 NOTE — [facility] Group Notes (Signed)
[facility] LCSW Group Therapy  04/30/2015 1:15 pm  Type of Therapy: Process Group Therapy  Participation Level:  Active  Participation Quality:  Appropriate  Affect:  Flat  Cognitive:  Oriented  Insight:  Improving  Engagement in Group:  Limited  Engagement in Therapy:  Limited  Modes of Intervention:  Activity, Clarification, Education, Problem-solving and Support  Summary of Progress/Problems: Today's group addressed the issue of overcoming obstacles.  Patients were asked to identify their biggest obstacle post d/c that stands in the way of their on-going success, and then problem solve as to how to manage this.  Stayed the entire time.  Eating crackers at the beginning.  Eyes closed for most of the time. Had nothing to contribute.  Carolyn Ayala, Carolyn Ayala 04/30/2015   1:44 PM  '

## 2015-04-30 NOTE — Tx Team (Signed)
Date:  04/30/2015  Time Reviewed:  8:36 AM   Progress in Treatment: Attending groups: Yes. Participating in groups:  Yes. Taking medication as prescribed:  Yes. Tolerating medication:  Yes. Family/Significant othe contact made:  SPE completed with pt, She refused to consent to family contact.  Patient understands diagnosis:  Yes. and As evidenced by:  seeking treatment for alcohol abuse, depression, medication stabilization, and for passive SI upon admisson.  Discussing patient identified problems/goals with staff:  Yes. Medical problems stabilized or resolved:  Yes. Denies suicidal/homicidal ideation: Yes. Issues/concerns per patient self-inventory:  Other: Pt not allowed off unit for lunch due to need for monitoring of food-pt continues to resist healthy eating in effort to stabilize her blood sugar. She is resistant to education about this as well.   Discharge Plan or Barriers: Pt has [facility] referral pending-blood sugar is still too high for her to be accepted. Pt referral faxed to [place]. She has [facility] referral for outpatient services as well and has been provided with: [employer] information, [facility] info, and [facility] list for [place]>   Reason for Continuation of Hospitalization: Depression Medication stabilization Withdrawal symptoms SI  Comments:  Patient said that she is having thoughts of killing herself. Patient cites the death of her mother a month ago as the reason she is so depressed and wanting to kill herself. Patient lives by herself and is unclear about whether she is able to go back to her residence. She says "I haven't thought of that because I am going to kill myself." Patient has diabetes and is insulin dependent. She asked this clinician how much insulin she would need to overdose on to die in her sleep. Patient also wants to try to get hit by a car or get a toy gun and point it at a cop to commit "suicide by cop." Patient does not want to go in  voluntarily to a psychiatry hospital Patient reports that she drinks five 40oz malt liquors daily for the last month. She drank before her mother died but this has increased since mother died. Patient last drank on 2023/05/14 and reports some tingling on skin and some tremors in hands. Patient denies other drug use.Patient is a risk to try to harm herself if discharged. She needs inpatient care. Patient did go to [facility] in August of 2014. Has no current outpatient care.Diagnosis: Axis 1: Substance induced mood d/o; ETOH use d/o severe  Estimated length of stay:  2-3 days  Additional Comments:  Patient and CSW reviewed pt's identified goals and treatment plan. Patient verbalized understanding and agreed to treatment plan. CSW reviewed [facility] "Discharge Process and Patient Involvement" Form. Pt verbalized understanding of information provided and signed form.    Review of initial/current patient goals per problem list:  1. Goal(s): Patient will participate in aftercare plan  Met: No.   Target date: at discharge  As evidenced by: Patient will participate within aftercare plan AEB aftercare provider and housing plan at discharge being identified.  11/7: CSW assessing for appropriate referrals. [facility] referral sent. [facility] for o/p med manamgent and counseling.   11/11: [facility] referral made- pt 's blood sugar too high at this time.   11/17: Pt continues to be denied by [facility] due to high blood sugar. [facility] referral sent 04/30/2015 12:41 PM   2. Goal (s): Patient will exhibit decreased depressive symptoms and suicidal ideations.  Met: No.    Target date: at discharge  As evidenced by: Patient will utilize self rating  of depression at 3 or below and demonstrate decreased signs of depression or be deemed stable for discharge by MD.  11/7: Pt rates depression as 8/10 and presents with depression/flat affect. Denies SI/HI/AVH.   11/11: Pt rates depression as 6/10. Goal progressing.   11/17: Pt rates  depression as high and is now endorsing passive SI, threatening to kill herself if discharged.  3. Goal(s): Patient will demonstrate decreased signs of withdrawal due to substance abuse  Met:No.   Target date:at discharge   As evidenced by: Patient will produce a CIWA/COWS score of 0, have stable vitals signs, and no symptoms of withdrawal.  11/7: Pt reports mild withdrawals with CIWA score of 5 and high pulse.   11/11: Pt reports mild withdrawals with CIWA score of 4 and high pulse.   11/17: Pt reports mild withdrawals with CIWA score of 6 and high pulse, High sitting BP.   Attendees: Patient:   04/30/2015 12:38 PM   Family:   04/30/2015 12:38 PM   Physician:  Dr. Carlton Adam, MD 04/30/2015 12:38 PM   Nursing:   Minta Balsam RN  04/30/2015 12:38 PM   Clinical Social Worker: Maxie Better, [place]  04/30/2015 12:38 PM   Clinical Social Worker: Erasmo Downer Drinkard LCSWA; Peri Maris Wilson 04/30/2015 12:38 PM   Other:  04/30/2015 12:38 PM   Other:  Lucinda Dell; [facility]  04/30/2015 12:38 PM   Other:   04/30/2015 12:38 PM   Other:  04/30/2015 12:38 PM   Other:  04/30/2015 12:38 PM   Other:  04/30/2015 12:38 PM    04/30/2015 12:38 PM    04/30/2015 12:38 PM    04/30/2015 12:38 PM    04/30/2015 12:38 PM    Scribe for Treatment Team:   Maxie Better, LCSW 04/30/2015 12:38 PM

## 2015-04-30 NOTE — [facility] Group Notes (Signed)
[facility] Group Notes:  (Nursing/MHT/Case Management/Adjunct)  Date:  04/30/2015  Time:  1:46 PM Type of Therapy:  Psychoeducational Skills  Participation Level:  Active  Participation Quality:  Appropriate  Affect:  Appropriate  Cognitive:  Appropriate  Insight:  Appropriate  Engagement in Group:  Engaged  Modes of Intervention:  Problem-solving  Summary of Progress/Problems: Summary of Progress/Problems: Topic was on leisure and lifestyle changes. Discussed the important of choosing healthy leisure activities. Group encouraged to surround themselves with positive and healthy group/support system when changing to a healthy life style. Pt stated her she like to cook as a leisure activity. Carolyn PunchesJane O [name] 04/30/2015, 1:46 PM

## 2015-04-30 NOTE — Consult Note (Addendum)
Patient Demographics  Carolyn Ayala, is a 39 y.o. female   MRN: [id number]   DOB - 1976-05-27  Admit Date - 04/19/2015    Outpatient Primary MD for the patient is No primary care provider on file.  Consult requested in the Hospital by Rachael FeeIrving A Lugo, MD, On 04/30/2015    Reason for consult  Diabetes control   With History of -  Past Medical History  Diagnosis Date  . Mental disorder   . Depression   . Diabetes mellitus without complication (HCC)   . Hypertension       Past Surgical History  Procedure Laterality Date  . Cesarean section      in for   No chief complaint on file.    HPI  Carolyn Ayala  is a 39 y.o. female, was admitted to [facility] due to depression, she was recently diagnosed with diabetes with a1c 11.5, she is started on insulin and metformin, patient developed diarrhea with metformin, her blood sugar is poorly controlled. hospitalist consulted to assist on diabetes management. Patient currently denies chest pain, no sob, no lower extremity edema, no fever, does compliant of numbness and tingling in her feet.     Review of Systems    In addition to the HPI above,  No Fever-chills, No Headache, No changes with Vision or hearing, No problems swallowing food or Liquids, No Chest pain, Cough or Shortness of Breath, No Abdominal pain, No Nausea or Vommitting, Bowel movements are regular, No Blood in stool or Urine, No dysuria, No new skin rashes or bruises, No new joints pains-aches,  No new weakness, tingling, numbness in any extremity, No recent weight gain or loss, No polyuria, polydypsia or polyphagia, No significant Mental Stressors.  A full 10 point Review of Systems was done, except as stated above, all other Review of Systems were negative.   Social History Social History  Substance Use Topics  . Smoking status: Current Every Day  Smoker -- 1.00 packs/day    Types: Cigarettes  . Smokeless tobacco: Current User  . Alcohol Use: Yes     Comment: daily     Family History Family History  Problem Relation Age of Onset  . Depression Mother      Prior to Admission medications   Medication Sig Start Date End Date Taking? Authorizing Provider  cyclobenzaprine (FLEXERIL) 10 MG tablet Take 10 mg by mouth 3 (three) times daily as needed for muscle spasms.   Yes Historical Provider, MD  FLUoxetine (PROZAC) 40 MG capsule Take 1 capsule (40 mg total) by mouth every morning. 02/17/13  Yes Charm RingsJamison Y Lord, NP  gabapentin (NEURONTIN) 800 MG tablet Take 800 mg by mouth 4 (four) times daily.   Yes Historical Provider, MD  hydrOXYzine (ATARAX/VISTARIL) 50 MG tablet Take 100 mg by mouth every 6 (six) hours as needed for anxiety.   Yes Historical Provider, MD  insulin glargine (LANTUS) 100 UNIT/ML injection Inject 40-44 Units into the skin every morning. Take 40 units in the  morning and 44 units at bedtime   Yes Historical Provider, MD  insulin lispro (HUMALOG) 100 UNIT/ML injection Inject 3-15 Units into the skin 3 (three) times daily before meals. Blood sugar less than 200  0 units 200-250 3-5 units Greater than 250 10 -15 units   Yes Historical Provider, MD  lisinopril (PRINIVIL,ZESTRIL) 5 MG tablet Take 1 tablet (5 mg total) by mouth daily. 02/17/13  Yes Charm Rings, NP  mirtazapine (REMERON) 30 MG tablet Take 1 tablet (30 mg total) by mouth at bedtime. 02/17/13  Yes Charm Rings, NP  traZODone (DESYREL) 50 MG tablet Take 25-50 mg by mouth daily as needed for sleep.   Yes Historical Provider, MD  loratadine (CLARITIN) 10 MG tablet Take 10 mg by mouth every morning.    Historical Provider, MD  metFORMIN (GLUCOPHAGE) 500 MG tablet Take 2 tablets (1,000 mg total) by mouth 2 (two) times daily with a meal. 02/17/13   Charm Rings, NP  Multiple Vitamin (MULTIVITAMIN WITH MINERALS) TABS tablet Take 1 tablet by mouth every morning.     Historical Provider, MD  traMADol (ULTRAM) 50 MG tablet Take 100 mg by mouth every 6 (six) hours as needed for pain.    Historical Provider, MD  triamcinolone cream (KENALOG) 0.1 % Apply 1 application topically 2 (two) times daily as needed (rash).    Historical Provider, MD    Anti-infectives    None      Scheduled Meds: . ARIPiprazole  5 mg Oral BH-qamhs  . capsaicin   Topical BID  . divalproex  250 mg Oral 3 times per day  . feeding supplement (GLUCERNA SHAKE)  237 mL Oral BID BM  . ferrous sulfate  325 mg Oral BID WC  . FLUoxetine  20 mg Oral Daily  . gabapentin  1,000 mg Oral TID  . gabapentin  800 mg Oral QHS  . Influenza vac split quadrivalent PF  0.5 mL Intramuscular Tomorrow-1000  . insulin aspart  0-20 Units Subcutaneous TID WC  . insulin aspart  0-5 Units Subcutaneous QHS  . insulin aspart protamine- aspart  42 Units Subcutaneous BID WC  . lidocaine  1 patch Transdermal Daily  . lisinopril  5 mg Oral Daily  . loratadine  10 mg Oral q morning - 10a  . metFORMIN  1,000 mg Oral BID WC  . mirtazapine  7.5 mg Oral QHS  . multivitamin with minerals  1 tablet Oral Daily  . pneumococcal 23 valent vaccine  0.5 mL Intramuscular Tomorrow-1000  . thiamine  100 mg Intramuscular Once  . thiamine  100 mg Oral Daily   Continuous Infusions:  PRN Meds:.acetaminophen, alum & mag hydroxide-simeth, benzocaine, carisoprodol, hydrOXYzine, ibuprofen, loperamide, magnesium hydroxide  No Known Allergies  Physical Exam  Vitals  Blood pressure 116/91, pulse 108, temperature 97.3 F (36.3 C), temperature source Oral, resp. rate 20, height 5' 2.01" (1.575 m), weight 171 lb (77.565 kg), last menstrual period 03/25/2015.   1. General , anxious  2. Normal affect and insight, Not Suicidal or Homicidal, Awake Alert, Oriented X 3.  3. No F.N deficits, ALL C.Nerves Intact, Strength 5/5 all 4 extremities, Sensation intact all 4 extremities, Plantars down going.  4. Ears and Eyes appear  Normal, Conjunctivae clear, PERRLA. Moist Oral Mucosa.  5. Supple Neck, No JVD, No cervical lymphadenopathy appriciated, No Carotid Bruits.  6. Symmetrical Chest wall movement, Good air movement bilaterally, CTAB.  7. RRR, No Gallops, Rubs or Murmurs, No Parasternal Heave.  8. Positive  Bowel Sounds, Abdomen Soft, No tenderness, No organomegaly appriciated,No rebound -guarding or rigidity.  9.  No Cyanosis, Normal Skin Turgor, No Skin Rash or Bruise.  10. Good muscle tone,  joints appear normal , no effusions, Normal ROM.  11. No Palpable Lymph Nodes in Neck or Axillae    Data Review  CBC No results for input(s): WBC, HGB, HCT, PLT, MCV, MCH, MCHC, RDW, LYMPHSABS, MONOABS, EOSABS, BASOSABS, BANDABS in the last 168 hours.  Invalid input(s): NEUTRABS, BANDSABD ------------------------------------------------------------------------------------------------------------------  Chemistries  No results for input(s): NA, K, CL, CO2, GLUCOSE, BUN, CREATININE, CALCIUM, MG, AST, ALT, ALKPHOS, BILITOT in the last 168 hours.  Invalid input(s): GFRCGP ------------------------------------------------------------------------------------------------------------------ estimated creatinine clearance is 92 mL/min (by C-G formula based on Cr of 0.57). ------------------------------------------------------------------------------------------------------------------  Recent Labs  04/29/15 0620  TSH 2.820     Coagulation profile No results for input(s): INR, PROTIME in the last 168 hours. ------------------------------------------------------------------------------------------------------------------- No results for input(s): DDIMER in the last 72 hours. -------------------------------------------------------------------------------------------------------------------  Cardiac Enzymes No results for input(s): CKMB, TROPONINI, MYOGLOBIN in the last 168 hours.  Invalid input(s):  CK ------------------------------------------------------------------------------------------------------------------ Invalid input(s): POCBNP   ---------------------------------------------------------------------------------------------------------------  Urinalysis No results found for: COLORURINE, APPEARANCEUR, LABSPEC, PHURINE, GLUCOSEU, HGBUR, BILIRUBINUR, KETONESUR, PROTEINUR, UROBILINOGEN, NITRITE, LEUKOCYTESUR   Imaging results:   No results found.     Assessment & Plan  Principal Problem:   MDD (major depressive disorder), recurrent severe, without psychosis (HCC) Active Problems:   Substance induced mood disorder (HCC)   IDDM (insulin dependent diabetes mellitus) (HCC)   PTSD (post-traumatic stress disorder)   Alcohol dependence with withdrawal, uncomplicated (HCC)   Uncontrolled diabetes mellitus with diabetic neuropathy, with long-term current use of insulin (HCC)    1. Diabetes newly diagnosed, continue titrate insulin does, continue monitor by Diabetic RN and nutrition RN for diabetic education.  Patient reported she is overwhelmed with the new diagnosis, but does eager to learn, she reported she learn better by reading, I have talked to patient's nurse and  requested diabetic coordinator to provide more reading materials for her, she will need to be discharged on insulin, so she will also need insulin teaching, she will need to have a primary care physician to close monitor her blood sugar, she needs to have glucometer/testing strips/lancet prescription, (ordered in the computer).   2. Peripheral neuropathy due to diabetes: on neurontin already. Will check b12 level. Consider supplement if low.   Plan of care discussed with nurse practitioner in person.  Family Communication: Plan discussed with patient    Thank you for the consult, we will follow the patient with you in the Hospital.   Masyn Fullam M.D PhD on 04/30/2015 at 11:49 AM  Between 7am to 7pm -  Pager - [phone]  After 7pm go to [email or web address] - password [email or web address]   Thank you for the consult, we will follow the patient with you in the Hospital.   [facility] Office  [phone]

## 2015-05-01 DIAGNOSIS — Z794 Long term (current) use of insulin: Secondary | ICD-10-CM

## 2015-05-01 DIAGNOSIS — E1165 Type 2 diabetes mellitus with hyperglycemia: Secondary | ICD-10-CM | POA: Insufficient documentation

## 2015-05-01 DIAGNOSIS — IMO0002 Reserved for concepts with insufficient information to code with codable children: Secondary | ICD-10-CM | POA: Insufficient documentation

## 2015-05-01 DIAGNOSIS — F3164 Bipolar disorder, current episode mixed, severe, with psychotic features: Secondary | ICD-10-CM | POA: Clinically undetermined

## 2015-05-01 DIAGNOSIS — E1149 Type 2 diabetes mellitus with other diabetic neurological complication: Secondary | ICD-10-CM | POA: Insufficient documentation

## 2015-05-01 LAB — GLUCOSE, CAPILLARY: Glucose-Capillary: 213 mg/dL — ABNORMAL HIGH (ref 65–99)

## 2015-05-01 LAB — VITAMIN B12: Vitamin B-12: 674 pg/mL (ref 180–914)

## 2015-05-01 MED ORDER — LIVING WELL WITH DIABETES BOOK: 1.0000 | Freq: Once | Status: DC

## 2015-05-01 MED ORDER — INSULIN GLARGINE 100 UNIT/ML ~~LOC~~ SOLN: 42.0000 [IU] | Freq: Every morning | SUBCUTANEOUS | Status: DC

## 2015-05-01 MED ORDER — LIVING WELL WITH DIABETES BOOK
Freq: Once | Status: DC
  Filled 2015-05-01: qty 1

## 2015-05-01 MED ORDER — FERROUS SULFATE 325 (65 FE) MG PO TABS: 325.0000 mg | ORAL_TABLET | Freq: Every day | ORAL | Status: DC

## 2015-05-01 MED ORDER — CARISOPRODOL 350 MG PO TABS
350.0000 mg | ORAL_TABLET | Freq: Four times a day (QID) | ORAL | Status: DC | PRN
Start: 2015-05-01 — End: 2015-07-09

## 2015-05-01 MED ORDER — GABAPENTIN 300 MG PO CAPS
900.0000 mg | ORAL_CAPSULE | Freq: Four times a day (QID) | ORAL | Status: DC
  Administered 2015-05-01: 900 mg via ORAL
  Filled 2015-05-01 (×8): qty 84

## 2015-05-01 MED ORDER — CAPSAICIN 0.025 % EX CREA: TOPICAL_CREAM | Freq: Two times a day (BID) | CUTANEOUS | Status: DC

## 2015-05-01 MED ORDER — INSULIN STARTER KIT- SYRINGES (ENGLISH)
1.0000 | Freq: Once | Status: DC
  Filled 2015-05-01: qty 1

## 2015-05-01 MED ORDER — LORATADINE 10 MG PO TABS: 10.0000 mg | ORAL_TABLET | Freq: Every morning | ORAL | Status: DC

## 2015-05-01 MED ORDER — INSULIN LISPRO 100 UNIT/ML ~~LOC~~ SOLN: 3.0000 [IU] | Freq: Three times a day (TID) | SUBCUTANEOUS | Status: DC

## 2015-05-01 MED ORDER — METFORMIN HCL 500 MG PO TABS: 1000.0000 mg | ORAL_TABLET | Freq: Two times a day (BID) | ORAL | Status: DC

## 2015-05-01 MED ORDER — BENZOCAINE 10 % MT GEL: Freq: Three times a day (TID) | OROMUCOSAL | Status: DC | PRN

## 2015-05-01 MED ORDER — HYDROXYZINE HCL 50 MG PO TABS: 50.0000 mg | ORAL_TABLET | Freq: Three times a day (TID) | ORAL | Status: DC | PRN

## 2015-05-01 MED ORDER — INSULIN STARTER KIT- SYRINGES (ENGLISH): 1.0000 | Freq: Once | Status: DC

## 2015-05-01 MED ORDER — DIVALPROEX SODIUM 250 MG PO DR TAB: 250.0000 mg | DELAYED_RELEASE_TABLET | Freq: Three times a day (TID) | ORAL | Status: DC

## 2015-05-01 MED ORDER — FLUOXETINE HCL 20 MG PO CAPS: 20.0000 mg | ORAL_CAPSULE | Freq: Every day | ORAL | Status: DC

## 2015-05-01 MED ORDER — ARIPIPRAZOLE 5 MG PO TABS: 5.0000 mg | ORAL_TABLET | ORAL | Status: DC

## 2015-05-01 MED ORDER — LIDOCAINE 5 % EX PTCH: 1.0000 | MEDICATED_PATCH | Freq: Every day | CUTANEOUS | Status: DC

## 2015-05-01 MED ORDER — MIRTAZAPINE 7.5 MG PO TABS: 7.5000 mg | ORAL_TABLET | Freq: Every day | ORAL | Status: DC

## 2015-05-01 MED ORDER — LISINOPRIL 5 MG PO TABS: 5.0000 mg | ORAL_TABLET | Freq: Every day | ORAL | Status: DC

## 2015-05-01 MED ORDER — GABAPENTIN 300 MG PO CAPS: 900.0000 mg | ORAL_CAPSULE | Freq: Three times a day (TID) | ORAL | Status: DC

## 2015-05-01 NOTE — Tx Team (Signed)
Date:  05/01/2015  Time Reviewed:  8:36 AM   Progress in Treatment: Attending groups: Yes. Participating in groups:  Yes. Taking medication as prescribed:  Yes. Tolerating medication:  Yes. Family/Significant othe contact made:  SPE completed with pt, She refused to consent to family contact.  Patient understands diagnosis:  Yes. and As evidenced by:  seeking treatment for alcohol abuse, depression, medication stabilization, and for passive SI upon admisson.  Discussing patient identified problems/goals with staff:  Yes. Medical problems stabilized or resolved:  Yes. Denies suicidal/homicidal ideation: Yes. Issues/concerns per patient self-inventory:  Other: Pt not allowed off unit for lunch due to need for monitoring of food-pt continues to resist healthy eating in effort to stabilize her blood sugar. She is resistant to education about this as well.   Discharge Plan or Barriers: Pt has been accepted to [facility] by Santiago Glad in admissions for today, 05/01/15. Pt IVCed by Dr. Shea Evans. Paperwork has been faxed to both Sgt Pascal/[employer] transportation and [facility] Mickeal Needy) per their request. Sgt Pascal has been made aware that pt is becoming combative and agitated. He will get someone here as soon as he has an Garment/textile technologist available. [place] notified.   Reason for Continuation of Hospitalization: none  Comments:  Patient said that she is having thoughts of killing herself. Patient cites the death of her mother a month ago as the reason she is so depressed and wanting to kill herself. Patient lives by herself and is unclear about whether she is able to go back to her residence. She says "I haven't thought of that because I am going to kill myself." Patient has diabetes and is insulin dependent. She asked this clinician how much insulin she would need to overdose on to die in her sleep. Patient also wants to try to get hit by a car or get a toy gun and point it at a cop to commit "suicide by cop." Patient  does not want to go in voluntarily to a psychiatry hospital Patient reports that she drinks five 40oz malt liquors daily for the last month. She drank before her mother died but this has increased since mother died. Patient last drank on May 01, 2023 and reports some tingling on skin and some tremors in hands. Patient denies other drug use.Patient is a risk to try to harm herself if discharged. She needs inpatient care. Patient did go to [facility] in August of 2014. Has no current outpatient care.Diagnosis: Axis 1: Substance induced mood d/o; ETOH use d/o severe  Estimated length of stay:  D/c directly to [place] today.   Additional Comments:  Patient and CSW reviewed pt's identified goals and treatment plan. Patient verbalized understanding and agreed to treatment plan. CSW reviewed [facility] "Discharge Process and Patient Involvement" Form. Pt verbalized understanding of information provided and signed form.    Review of initial/current patient goals per problem list:  1. Goal(s): Patient will participate in aftercare plan  Met: Yes.   Target date: at discharge  As evidenced by: Patient will participate within aftercare plan AEB aftercare provider and housing plan at discharge being identified.  11/7: CSW assessing for appropriate referrals. [facility] referral sent. [facility] for o/p med manamgent and counseling.   11/11: [facility] referral made- pt 's blood sugar too high at this time.   11/17: Pt continues to be denied by [facility] due to high blood sugar. [facility] referral sent 05/01/2015 12:52 PM   11/18: Pt accepted to [place].   2. Goal (s): Patient will exhibit decreased depressive  symptoms and suicidal ideations.  Met:  Goal adequate for d/c to [facility].    Target date: at discharge  As evidenced by: Patient will utilize self rating of depression at 3 or below and demonstrate decreased signs of depression or be deemed stable for discharge by MD.  11/7: Pt rates depression as 8/10 and presents with  depression/flat affect. Denies SI/HI/AVH.   11/11: Pt rates depression as 6/10. Goal progressing.   11/17: Pt rates depression as high and is now endorsing passive SI, threatening to kill herself if discharged.  11/18: Pt rates depression as high and is demanding to d/c. Pt labile. Pt transferring to [place] and has been IVCed.   3. Goal(s): Patient will demonstrate decreased signs of withdrawal due to substance abuse  HRC:BULAGTXM for d/c to [facility] per Dr. Shea Evans.   Target date:at discharge   As evidenced by: Patient will produce a CIWA/COWS score of 0, have stable vitals signs, and no symptoms of withdrawal.  11/7: Pt reports mild withdrawals with CIWA score of 5 and high pulse.   11/11: Pt reports mild withdrawals with CIWA score of 4 and high pulse.   11/17: Pt reports mild withdrawals with CIWA score of 6 and high pulse, High sitting BP.   11/18: Pt reports no signs of withdrawal with no CIWA/COWS taken. High pulse.   Attendees: Patient:   05/01/2015 12:52 PM   Family:   05/01/2015 12:52 PM   Physician:  Dr. Carlton Adam, MD 05/01/2015 12:52 PM   Nursing:   Minta Balsam RN  05/01/2015 12:52 PM   Clinical Social Worker: Maxie Better, [place]  05/01/2015 12:52 PM   Clinical Social Worker: Erasmo Downer Drinkard LCSWA; Peri Maris LCSWA 05/01/2015 12:52 PM   Other:  05/01/2015 12:52 PM   Other:  Lucinda Dell; [facility]  05/01/2015 12:52 PM   Other:   05/01/2015 12:52 PM   Other:  05/01/2015 12:52 PM   Other:  05/01/2015 12:52 PM   Other:  05/01/2015 12:52 PM    05/01/2015 12:52 PM    05/01/2015 12:52 PM    05/01/2015 12:52 PM    05/01/2015 12:52 PM    Scribe for Treatment Team:   Maxie Better, LCSW 05/01/2015 12:52 PM

## 2015-05-01 NOTE — [facility] Group Notes (Signed)
[facility] LCSW Group Therapy   05/01/2015 2:15 PM  Type of Therapy: Group Therapy  Participation Level: Invited. Chose not to attend.  Summary of Progress/Problems: Patients played therapeutic four square and expressed their feelings through drawings.  Jonathon JordanLynn B Fadi Menter 05/01/2015 2:15 PM

## 2015-05-01 NOTE — Progress Notes (Signed)
  [facility] Adult Case Management Discharge Plan :  Will you be returning to the same living situation after discharge:  No.Pt IVCed to [facility]  At discharge, do you have transportation home?: Yes,  [employer] transporting pt to facility this afternoon. Clydie BraunKaren in Admissions at [facility] is aware that pt may arrive as late as 8PM Do you have the ability to pay for your medications: Yes,  mental health  Release of information consent forms completed and submitted to medical records by CSW.  Patient to Follow up at: Follow-up Information    Follow up with [facility] ([facility]   Why:  Every Tuesday between 9am-12pm this agency has walk-in triage for assessment (medication management, substance abuse counseling/Substance abuse intensive outpatient.    Contact information:   [address] [place], [place] [address] Phone: [phone] Fax: [phone]      Follow up with [facility] On 05/01/2015.   Why:  Referral sent: 04/30/15. You have been accepted for treatment on this date. [employer] will transport you to facility this afternoon.    Contact information:   [address] [place], [place] [address] Phone: [phone] Fax: [phone]      Next level of care provider [facility] Link:no  Patient denies SI/HI: Yes, during self report.    Safety Planning and Suicide Prevention discussed: Yes,  SPE completed with pt, as she refused to consent to family contact.  Have you used any form of tobacco in the last 30 days? (Cigarettes, Smokeless Tobacco, Cigars, and/or Pipes): Yes  Has patient been referred to the Quitline?: Patient refused referral  Smart, Herbert SetaHeather LCSW 05/01/2015, 12:51 PM

## 2015-05-01 NOTE — Clinical Social Work Note (Signed)
Discharge note attached to IVC paperwork in chart for [employer] to deliver to admitting MD at [facility] today. (per their request)  Trula SladeHeather Smart, MSW, LCSW Clinical Social Worker 05/01/2015 3:40 PM

## 2015-05-01 NOTE — Clinical Social Work Note (Addendum)
CSW spoke with Carolyn Ayala regarding transportation. He is aware that pt has been placed on 1:1 and is labile/combative due to IVC to [facility]. He will send officer as soon as possible but has noone available yet. He will call the Nurse Station when en route. Carolyn Ayala and Carolyn Ayala notified.   Carolyn SladeHeather Ayala, MSW, Ayala Clinical Social Worker 05/01/2015 1:04 PM    (admitting doctor: Dr. Shawnee KnappGeorge Ayala)  Carolyn SladeHeather Ayala, MSW, Ayala Clinical Social Worker 05/01/2015 1:10 PM

## 2015-05-01 NOTE — Progress Notes (Signed)
Pt discharged to [facility] accompanied by [employer]. Pt was ambulatory,stable and appreciative at that time. All papers and prescriptions were given and valuables returned. Verbal understanding expressed. Denies SI/HI and A/VH. Pt given opportunity to express concerns and ask questions.

## 2015-05-01 NOTE — Progress Notes (Signed)
Pt is on close observation due to unpredicted behavior after being informed the discharge to [facility]. Pt does want to go [facility]. Will continue to monitor.

## 2015-05-01 NOTE — Progress Notes (Addendum)
Pt now on insulin, continue same regimen at home with long acting insulin 70/30, if she does not have PCP, [facility] # [phone] and she will be set up with provider. Needs to be checking her sugar levels at least 2-3 times per day.  Thank you, will sing off, call me additional questions.   Debbora PrestoMAGICK-Brevyn Ring, MD  [facility] Pager [phone] Cell [phone]   If 7PM-7AM, please contact night-coverage [email or web address] Password [email or web address]
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
## 2015-05-01 NOTE — Progress Notes (Signed)
D: Pt is alert and oriented x4. Pt complained of moderate anxiety. She states, "I have a feeling that I will be stuck here; we have called many places and all said they don't accept a female Pt that takes insulin because the needles may make others feel like shooting up drugs again." Pt also complained of mild back pain. Pt however denies anxiety, SI, HI and AVH. Pt was calm and cooperative through the assessment.  A: Pt was encouraged to attend group. Medications offered as prescribed.  Support, encouragement, and safe environment provided.  15-minute safety checks continue.  R: Pt was med compliant.  Pt attended group. Safety checks continue

## 2015-05-02 LAB — GLUCOSE, CAPILLARY
Glucose-Capillary: 110 mg/dL — ABNORMAL HIGH (ref 65–99)
Glucose-Capillary: 342 mg/dL — ABNORMAL HIGH (ref 65–99)
Glucose-Capillary: 117 mg/dL — ABNORMAL HIGH (ref 65–99)
Glucose-Capillary: 262 mg/dL — ABNORMAL HIGH (ref 65–99)

## 2015-07-03 ENCOUNTER — Encounter (HOSPITAL_COMMUNITY): Payer: Self-pay | Admitting: Emergency Medicine

## 2015-07-03 ENCOUNTER — Emergency Department (HOSPITAL_COMMUNITY): Payer: Self-pay

## 2015-07-03 ENCOUNTER — Emergency Department (HOSPITAL_COMMUNITY)
Admission: EM | Admit: 2015-07-03 | Discharge: 2015-07-03 | Disposition: A | Discharge status: Other Acute Inpatient Hospital | Payer: Self-pay | Attending: Emergency Medicine | Admitting: Emergency Medicine

## 2015-07-03 ENCOUNTER — Inpatient Hospital Stay (HOSPITAL_COMMUNITY)
Admission: AD | Admit: 2015-07-03 | Discharge: 2015-07-09 | DRG: 897 | Disposition: A | Discharge status: Rehabilitation Facility | Payer: Federal, State, Local not specified - Other | Source: Intra-hospital | Attending: Psychiatry | Admitting: Psychiatry

## 2015-07-03 DIAGNOSIS — Z818 Family history of other mental and behavioral disorders: Secondary | ICD-10-CM | POA: Diagnosis not present

## 2015-07-03 DIAGNOSIS — R61 Generalized hyperhidrosis: Secondary | ICD-10-CM | POA: Insufficient documentation

## 2015-07-03 DIAGNOSIS — R197 Diarrhea, unspecified: Secondary | ICD-10-CM | POA: Diagnosis present

## 2015-07-03 DIAGNOSIS — R45851 Suicidal ideations: Secondary | ICD-10-CM | POA: Diagnosis present

## 2015-07-03 DIAGNOSIS — F3130 Bipolar disorder, current episode depressed, mild or moderate severity, unspecified: Secondary | ICD-10-CM | POA: Diagnosis not present

## 2015-07-03 DIAGNOSIS — F4312 Post-traumatic stress disorder, chronic: Secondary | ICD-10-CM | POA: Diagnosis present

## 2015-07-03 DIAGNOSIS — Z62810 Personal history of physical and sexual abuse in childhood: Secondary | ICD-10-CM | POA: Diagnosis present

## 2015-07-03 DIAGNOSIS — Y9389 Activity, other specified: Secondary | ICD-10-CM | POA: Insufficient documentation

## 2015-07-03 DIAGNOSIS — F319 Bipolar disorder, unspecified: Secondary | ICD-10-CM | POA: Insufficient documentation

## 2015-07-03 DIAGNOSIS — F1024 Alcohol dependence with alcohol-induced mood disorder: Secondary | ICD-10-CM | POA: Diagnosis not present

## 2015-07-03 DIAGNOSIS — R51 Headache: Secondary | ICD-10-CM | POA: Insufficient documentation

## 2015-07-03 DIAGNOSIS — E1140 Type 2 diabetes mellitus with diabetic neuropathy, unspecified: Secondary | ICD-10-CM | POA: Diagnosis present

## 2015-07-03 DIAGNOSIS — G4700 Insomnia, unspecified: Secondary | ICD-10-CM | POA: Diagnosis present

## 2015-07-03 DIAGNOSIS — F411 Generalized anxiety disorder: Secondary | ICD-10-CM | POA: Diagnosis present

## 2015-07-03 DIAGNOSIS — Y998 Other external cause status: Secondary | ICD-10-CM | POA: Insufficient documentation

## 2015-07-03 DIAGNOSIS — I10 Essential (primary) hypertension: Secondary | ICD-10-CM | POA: Diagnosis present

## 2015-07-03 DIAGNOSIS — Y9259 Other trade areas as the place of occurrence of the external cause: Secondary | ICD-10-CM | POA: Insufficient documentation

## 2015-07-03 DIAGNOSIS — F333 Major depressive disorder, recurrent, severe with psychotic symptoms: Secondary | ICD-10-CM | POA: Diagnosis present

## 2015-07-03 DIAGNOSIS — E119 Type 2 diabetes mellitus without complications: Secondary | ICD-10-CM | POA: Insufficient documentation

## 2015-07-03 DIAGNOSIS — Z794 Long term (current) use of insulin: Secondary | ICD-10-CM | POA: Diagnosis not present

## 2015-07-03 DIAGNOSIS — Z7984 Long term (current) use of oral hypoglycemic drugs: Secondary | ICD-10-CM | POA: Insufficient documentation

## 2015-07-03 DIAGNOSIS — R000 Tachycardia, unspecified: Secondary | ICD-10-CM | POA: Insufficient documentation

## 2015-07-03 DIAGNOSIS — E7800 Pure hypercholesterolemia, unspecified: Secondary | ICD-10-CM | POA: Diagnosis present

## 2015-07-03 DIAGNOSIS — Z79899 Other long term (current) drug therapy: Secondary | ICD-10-CM | POA: Insufficient documentation

## 2015-07-03 DIAGNOSIS — R5383 Other fatigue: Secondary | ICD-10-CM | POA: Insufficient documentation

## 2015-07-03 DIAGNOSIS — F1020 Alcohol dependence, uncomplicated: Secondary | ICD-10-CM | POA: Diagnosis present

## 2015-07-03 DIAGNOSIS — R112 Nausea with vomiting, unspecified: Secondary | ICD-10-CM | POA: Insufficient documentation

## 2015-07-03 DIAGNOSIS — F17210 Nicotine dependence, cigarettes, uncomplicated: Secondary | ICD-10-CM | POA: Insufficient documentation

## 2015-07-03 LAB — COMPREHENSIVE METABOLIC PANEL
ALT: 41 U/L (ref 14–54)
AST: 26 U/L (ref 15–41)
Albumin: 3.6 g/dL (ref 3.5–5.0)
Alkaline Phosphatase: 112 U/L (ref 38–126)
Anion gap: 13 (ref 5–15)
BUN: 8 mg/dL (ref 6–20)
CO2: 21 mmol/L — ABNORMAL LOW (ref 22–32)
Calcium: 9.5 mg/dL (ref 8.9–10.3)
Chloride: 105 mmol/L (ref 101–111)
Creatinine, Ser: 0.49 mg/dL (ref 0.44–1.00)
GFR calc Af Amer: >60 mL/min (ref >60)
GFR calc non Af Amer: >60 mL/min (ref >60)
Glucose, Bld: 292 mg/dL — ABNORMAL HIGH (ref 65–99)
Potassium: 4.2 mmol/L (ref 3.5–5.1)
Sodium: 139 mmol/L (ref 135–145)
Total Bilirubin: 0.3 mg/dL (ref 0.3–1.2)
Total Protein: 7.1 g/dL (ref 6.5–8.1)

## 2015-07-03 LAB — URINE MICROSCOPIC-ADD ON

## 2015-07-03 LAB — URINALYSIS, ROUTINE W REFLEX MICROSCOPIC
Bilirubin Urine: NEGATIVE
Glucose, UA: 100 mg/dL — AB
Hgb urine dipstick: NEGATIVE
Ketones, ur: NEGATIVE mg/dL
Nitrite: NEGATIVE
Protein, ur: NEGATIVE mg/dL
Specific Gravity, Urine: 1.024 (ref 1.005–1.030)
pH: 5.5 (ref 5.0–8.0)

## 2015-07-03 LAB — CBC WITH DIFFERENTIAL/PLATELET
Basophils Absolute: 0 10*3/uL (ref 0.0–0.1)
Basophils Relative: 0 %
Eosinophils Absolute: 0.3 10*3/uL (ref 0.0–0.7)
Eosinophils Relative: 4 %
HCT: 35.8 % — ABNORMAL LOW (ref 36.0–46.0)
Hemoglobin: 11.8 g/dL — ABNORMAL LOW (ref 12.0–15.0)
Lymphocytes Relative: 42 %
Lymphs Abs: 3.1 10*3/uL (ref 0.7–4.0)
MCH: 25.5 pg — ABNORMAL LOW (ref 26.0–34.0)
MCHC: 33 g/dL (ref 30.0–36.0)
MCV: 77.3 fL — ABNORMAL LOW (ref 78.0–100.0)
Monocytes Absolute: 0.3 10*3/uL (ref 0.1–1.0)
Monocytes Relative: 4 %
Neutro Abs: 3.6 10*3/uL (ref 1.7–7.7)
Neutrophils Relative %: 50 %
Platelets: 293 10*3/uL (ref 150–400)
RBC: 4.63 MIL/uL (ref 3.87–5.11)
RDW: 16.6 % — ABNORMAL HIGH (ref 11.5–15.5)
WBC: 7.3 10*3/uL (ref 4.0–10.5)

## 2015-07-03 LAB — LIPASE, BLOOD: Lipase: 70 U/L — ABNORMAL HIGH (ref 11–51)

## 2015-07-03 LAB — CBG MONITORING, ED
Glucose-Capillary: 303 mg/dL — ABNORMAL HIGH (ref 65–99)
Glucose-Capillary: 75 mg/dL (ref 65–99)
Glucose-Capillary: 199 mg/dL — ABNORMAL HIGH (ref 65–99)
Glucose-Capillary: 381 mg/dL — ABNORMAL HIGH (ref 65–99)
Glucose-Capillary: 268 mg/dL — ABNORMAL HIGH (ref 65–99)
Glucose-Capillary: 179 mg/dL — ABNORMAL HIGH (ref 65–99)

## 2015-07-03 LAB — TROPONIN I: Troponin I: <0.03 ng/mL (ref <0.031)

## 2015-07-03 LAB — GLUCOSE, CAPILLARY: Glucose-Capillary: 314 mg/dL — ABNORMAL HIGH (ref 65–99)

## 2015-07-03 MED ORDER — INSULIN ASPART 100 UNIT/ML ~~LOC~~ SOLN
15.0000 [IU] | Freq: Three times a day (TID) | SUBCUTANEOUS | Status: DC
  Administered 2015-07-04 – 2015-07-09 (×11): 15 [IU] via SUBCUTANEOUS

## 2015-07-03 MED ORDER — MIRTAZAPINE 7.5 MG PO TABS
7.5000 mg | ORAL_TABLET | Freq: Every day | ORAL | Status: DC
Start: 2015-07-03 — End: 2015-07-03
  Filled 2015-07-03: qty 1

## 2015-07-03 MED ORDER — NICOTINE 21 MG/24HR TD PT24: 21.0000 mg | MEDICATED_PATCH | Freq: Once | TRANSDERMAL | Status: DC

## 2015-07-03 MED ORDER — GABAPENTIN 300 MG PO CAPS
900.0000 mg | ORAL_CAPSULE | Freq: Three times a day (TID) | ORAL | Status: DC
  Administered 2015-07-03 (×3): 900 mg via ORAL
  Filled 2015-07-03 (×3): qty 3

## 2015-07-03 MED ORDER — INSULIN GLARGINE 100 UNIT/ML ~~LOC~~ SOLN
42.0000 [IU] | Freq: Two times a day (BID) | SUBCUTANEOUS | Status: DC
  Administered 2015-07-03: 42 [IU] via SUBCUTANEOUS
  Filled 2015-07-03 (×3): qty 0.42

## 2015-07-03 MED ORDER — LORAZEPAM 1 MG PO TABS
1.0000 mg | ORAL_TABLET | Freq: Three times a day (TID) | ORAL | Status: DC | PRN
Start: 2015-07-03 — End: 2015-07-03

## 2015-07-03 MED ORDER — LORAZEPAM 1 MG PO TABS
0.0000 mg | ORAL_TABLET | Freq: Four times a day (QID) | ORAL | Status: DC
Start: 2015-07-03 — End: 2015-07-03
  Administered 2015-07-03: 2 mg via ORAL
  Filled 2015-07-03: qty 2

## 2015-07-03 MED ORDER — THIAMINE HCL 100 MG/ML IJ SOLN: 100.0000 mg | Freq: Every day | INTRAMUSCULAR | Status: DC

## 2015-07-03 MED ORDER — HYDROXYZINE HCL 50 MG PO TABS
50.0000 mg | ORAL_TABLET | Freq: Two times a day (BID) | ORAL | Status: DC | PRN
  Administered 2015-07-03 – 2015-07-09 (×13): 50 mg via ORAL
  Filled 2015-07-03 (×13): qty 1

## 2015-07-03 MED ORDER — ACETAMINOPHEN 325 MG PO TABS: 650.0000 mg | ORAL_TABLET | ORAL | Status: DC | PRN

## 2015-07-03 MED ORDER — LORATADINE 10 MG PO TABS
10.0000 mg | ORAL_TABLET | Freq: Every morning | ORAL | Status: DC
  Administered 2015-07-04 – 2015-07-09 (×6): 10 mg via ORAL
  Filled 2015-07-03 (×10): qty 1

## 2015-07-03 MED ORDER — FOLIC ACID 1 MG PO TABS
1.0000 mg | ORAL_TABLET | Freq: Every day | ORAL | Status: DC
  Administered 2015-07-03: 1 mg via ORAL
  Filled 2015-07-03: qty 1

## 2015-07-03 MED ORDER — FERROUS SULFATE 325 (65 FE) MG PO TABS
325.0000 mg | ORAL_TABLET | Freq: Every day | ORAL | Status: DC
  Administered 2015-07-04 – 2015-07-09 (×6): 325 mg via ORAL
  Filled 2015-07-03 (×9): qty 1

## 2015-07-03 MED ORDER — LORAZEPAM 1 MG PO TABS: 0.0000 mg | ORAL_TABLET | Freq: Two times a day (BID) | ORAL | Status: DC

## 2015-07-03 MED ORDER — ONDANSETRON HCL 4 MG/2ML IJ SOLN: 4.0000 mg | Freq: Once | INTRAMUSCULAR | Status: DC

## 2015-07-03 MED ORDER — INSULIN ASPART 100 UNIT/ML ~~LOC~~ SOLN
0.0000 [IU] | SUBCUTANEOUS | Status: DC
Start: 2015-07-03 — End: 2015-07-03
  Administered 2015-07-03: 4 [IU] via SUBCUTANEOUS
  Administered 2015-07-03: 20 [IU] via SUBCUTANEOUS
  Filled 2015-07-03 (×2): qty 1

## 2015-07-03 MED ORDER — DIVALPROEX SODIUM 250 MG PO DR TAB
250.0000 mg | DELAYED_RELEASE_TABLET | Freq: Three times a day (TID) | ORAL | Status: DC
  Administered 2015-07-03 (×2): 250 mg via ORAL
  Filled 2015-07-03 (×2): qty 1

## 2015-07-03 MED ORDER — LISINOPRIL 10 MG PO TABS
5.0000 mg | ORAL_TABLET | Freq: Every day | ORAL | Status: DC
  Administered 2015-07-03: 5 mg via ORAL
  Filled 2015-07-03: qty 1

## 2015-07-03 MED ORDER — LORAZEPAM 2 MG/ML IJ SOLN: 1.0000 mg | Freq: Four times a day (QID) | INTRAMUSCULAR | Status: DC | PRN

## 2015-07-03 MED ORDER — HYDROXYZINE HCL 25 MG PO TABS: 25.0000 mg | ORAL_TABLET | Freq: Three times a day (TID) | ORAL | Status: DC | PRN

## 2015-07-03 MED ORDER — CARISOPRODOL 350 MG PO TABS: 350.0000 mg | ORAL_TABLET | Freq: Four times a day (QID) | ORAL | Status: DC | PRN

## 2015-07-03 MED ORDER — INSULIN GLARGINE 100 UNIT/ML ~~LOC~~ SOLN: 42.0000 [IU] | Freq: Every morning | SUBCUTANEOUS | Status: DC

## 2015-07-03 MED ORDER — GABAPENTIN 300 MG PO CAPS
900.0000 mg | ORAL_CAPSULE | Freq: Three times a day (TID) | ORAL | Status: DC
  Administered 2015-07-03 – 2015-07-09 (×23): 900 mg via ORAL
  Filled 2015-07-03 (×4): qty 3
  Filled 2015-07-03: qty 168
  Filled 2015-07-03 (×9): qty 3
  Filled 2015-07-03: qty 168
  Filled 2015-07-03 (×2): qty 3
  Filled 2015-07-03: qty 168
  Filled 2015-07-03 (×2): qty 3
  Filled 2015-07-03: qty 168
  Filled 2015-07-03 (×13): qty 3

## 2015-07-03 MED ORDER — ADULT MULTIVITAMIN W/MINERALS CH
1.0000 | ORAL_TABLET | Freq: Every day | ORAL | Status: DC
  Administered 2015-07-03: 1 via ORAL
  Filled 2015-07-03: qty 1

## 2015-07-03 MED ORDER — ARIPIPRAZOLE 10 MG PO TABS
5.0000 mg | ORAL_TABLET | ORAL | Status: DC
  Administered 2015-07-03: 5 mg via ORAL
  Filled 2015-07-03: qty 1

## 2015-07-03 MED ORDER — MIRTAZAPINE 7.5 MG PO TABS
7.5000 mg | ORAL_TABLET | Freq: Every day | ORAL | Status: DC
  Administered 2015-07-04: 7.5 mg via ORAL
  Filled 2015-07-03 (×5): qty 1

## 2015-07-03 MED ORDER — METFORMIN HCL 500 MG PO TABS
1000.0000 mg | ORAL_TABLET | Freq: Two times a day (BID) | ORAL | Status: DC
  Administered 2015-07-04 – 2015-07-09 (×11): 1000 mg via ORAL
  Filled 2015-07-03 (×7): qty 2
  Filled 2015-07-03: qty 56
  Filled 2015-07-03 (×9): qty 2

## 2015-07-03 MED ORDER — FLUOXETINE HCL 20 MG PO CAPS
20.0000 mg | ORAL_CAPSULE | Freq: Every day | ORAL | Status: DC
  Administered 2015-07-04 – 2015-07-09 (×6): 20 mg via ORAL
  Filled 2015-07-03 (×2): qty 1
  Filled 2015-07-03: qty 14
  Filled 2015-07-03 (×6): qty 1

## 2015-07-03 MED ORDER — ARIPIPRAZOLE 5 MG PO TABS
5.0000 mg | ORAL_TABLET | ORAL | Status: DC
  Administered 2015-07-03 – 2015-07-04 (×2): 5 mg via ORAL
  Filled 2015-07-03 (×8): qty 1

## 2015-07-03 MED ORDER — ALUM & MAG HYDROXIDE-SIMETH 200-200-20 MG/5ML PO SUSP: 30.0000 mL | ORAL | Status: DC | PRN

## 2015-07-03 MED ORDER — CAPSAICIN 0.025 % EX CREA
TOPICAL_CREAM | Freq: Two times a day (BID) | CUTANEOUS | Status: DC
  Administered 2015-07-04 – 2015-07-05 (×3): via TOPICAL
  Filled 2015-07-03: qty 56.6

## 2015-07-03 MED ORDER — SODIUM CHLORIDE 0.9 % IV BOLUS (SEPSIS)
1000.0000 mL | Freq: Once | INTRAVENOUS | Status: AC
  Administered 2015-07-03: 1000 mL via INTRAVENOUS

## 2015-07-03 MED ORDER — ONDANSETRON HCL 4 MG PO TABS: 4.0000 mg | ORAL_TABLET | Freq: Three times a day (TID) | ORAL | Status: DC | PRN

## 2015-07-03 MED ORDER — DIVALPROEX SODIUM 250 MG PO DR TAB
250.0000 mg | DELAYED_RELEASE_TABLET | Freq: Three times a day (TID) | ORAL | Status: DC
  Administered 2015-07-03 – 2015-07-04 (×2): 250 mg via ORAL
  Filled 2015-07-03 (×10): qty 1

## 2015-07-03 MED ORDER — LORAZEPAM 1 MG PO TABS: 1.0000 mg | ORAL_TABLET | Freq: Four times a day (QID) | ORAL | Status: DC | PRN

## 2015-07-03 MED ORDER — LISINOPRIL 5 MG PO TABS
5.0000 mg | ORAL_TABLET | Freq: Every day | ORAL | Status: DC
  Administered 2015-07-04 – 2015-07-09 (×6): 5 mg via ORAL
  Filled 2015-07-03 (×9): qty 1

## 2015-07-03 MED ORDER — IBUPROFEN 200 MG PO TABS
600.0000 mg | ORAL_TABLET | Freq: Three times a day (TID) | ORAL | Status: DC | PRN
  Administered 2015-07-03: 600 mg via ORAL
  Filled 2015-07-03: qty 1

## 2015-07-03 MED ORDER — CARISOPRODOL 350 MG PO TABS
350.0000 mg | ORAL_TABLET | Freq: Four times a day (QID) | ORAL | Status: DC | PRN
  Administered 2015-07-03 – 2015-07-04 (×2): 350 mg via ORAL
  Filled 2015-07-03 (×2): qty 1

## 2015-07-03 MED ORDER — MAGNESIUM HYDROXIDE 400 MG/5ML PO SUSP: 30.0000 mL | Freq: Every day | ORAL | Status: DC | PRN

## 2015-07-03 MED ORDER — METFORMIN HCL 500 MG PO TABS
1000.0000 mg | ORAL_TABLET | Freq: Two times a day (BID) | ORAL | Status: DC
  Administered 2015-07-03 (×2): 1000 mg via ORAL
  Filled 2015-07-03 (×2): qty 2

## 2015-07-03 MED ORDER — ACETAMINOPHEN 325 MG PO TABS
650.0000 mg | ORAL_TABLET | Freq: Four times a day (QID) | ORAL | Status: DC | PRN
  Administered 2015-07-05: 650 mg via ORAL
  Filled 2015-07-03: qty 2

## 2015-07-03 MED ORDER — VITAMIN B-1 100 MG PO TABS
100.0000 mg | ORAL_TABLET | Freq: Every day | ORAL | Status: DC
  Administered 2015-07-03: 100 mg via ORAL
  Filled 2015-07-03: qty 1

## 2015-07-03 NOTE — ED Notes (Signed)
Pt Knapp to bedside to evaluate patient. Made aware she is being IVC due to concern for safety and expression of SI. Pt cooperative. Sitter at bedside.

## 2015-07-03 NOTE — ED Notes (Signed)
Patient CBG was 75, the Nurse was informed.

## 2015-07-03 NOTE — ED Notes (Signed)
CBG 381 

## 2015-07-03 NOTE — ED Notes (Addendum)
Snack and Coffee given, and Dinner taken.

## 2015-07-03 NOTE — ED Notes (Signed)
Initially pt denied any S/I however pt told this RN "in confidence" that her "mother died last month and I don't want to live anymore."  She reports that she stopped taking her medications because "they did not work".  Informed Consulting civil engineer and staffing that pt reports she is suicidal.  Pt transported to X-ray.

## 2015-07-03 NOTE — ED Notes (Signed)
TTS in room at this time.  °

## 2015-07-03 NOTE — ED Provider Notes (Signed)
Pt was assessed by psychiatry.  Inpatient treatment recommended.  Pt is now stating she wants to leave and no longer wants to be in the ED.  I Explained to patient we are placing her on IVC because of concerns about her safety.  [name] 07/03/15 [phone]
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
## 2015-07-03 NOTE — ED Notes (Signed)
CBG 268. RN notified.

## 2015-07-03 NOTE — ED Notes (Signed)
CBG 179; RN notified 
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
## 2015-07-03 NOTE — ED Notes (Signed)
Per Catia pt has placement at [facility], bed #403-2, accepting physician Dr. Jama Flavors.

## 2015-07-03 NOTE — ED Notes (Signed)
Faxed IVC Paperwork  

## 2015-07-03 NOTE — ED Notes (Signed)
[employer] contacted for transport 

## 2015-07-03 NOTE — ED Notes (Signed)
Gave report to pod C RN

## 2015-07-03 NOTE — Progress Notes (Addendum)
Adult Psychoeducational Group Note Patient just arrived on unit did not attend group.

## 2015-07-03 NOTE — ED Notes (Signed)
Pt wanded by security. 

## 2015-07-03 NOTE — ED Notes (Signed)
Breakfast tray ordered Carb Modified @ 0642.

## 2015-07-03 NOTE — ED Notes (Signed)
CBG 199 

## 2015-07-03 NOTE — Progress Notes (Signed)
Patient accepted to [facility], to Dr. Jama Flavors, to 403-2, arrival time - any time now, call report at [phone]. RN Chrislyninformed.  Melbourne Abts, LCSWA Disposition staff 07/03/2015 3:32 PM

## 2015-07-03 NOTE — ED Notes (Signed)
Plan to IVC patient based on [facility] advice. Pt reporting SI ideation but that she has changed her mind. Pt repeatedly asking RN where the exit is.

## 2015-07-03 NOTE — Progress Notes (Signed)
Patient accepted to [facility], room 403-2. Rosey Bath, RN

## 2015-07-03 NOTE — ED Notes (Signed)
Patient was given a snack and drink, A regular diet ordered for lunch. 

## 2015-07-03 NOTE — ED Notes (Signed)
Patient is resting comfortably. 

## 2015-07-03 NOTE — BHH Counselor (Addendum)
TC for Pod A but no one answered. Writer called and spoke w/ Boyd Kerbs, but pt isn't hers. TC again to Pod A but no answer.   Evette Cristal, [place] Therapeutic Triage Specialist [phone]  Writer spoke w/ [facility] secretary who informed writer that pt has been moved to [facility]. Writer then spoke w/ Maralyn Sago RN who said that pt just arrived at Pod C. Maralyn Sago said she will place TTS machine in pt's room.  Evette Cristal, [place] Therapeutic Triage Specialist [phone]

## 2015-07-04 ENCOUNTER — Encounter (HOSPITAL_COMMUNITY): Payer: Self-pay

## 2015-07-04 DIAGNOSIS — F3130 Bipolar disorder, current episode depressed, mild or moderate severity, unspecified: Secondary | ICD-10-CM

## 2015-07-04 DIAGNOSIS — F1024 Alcohol dependence with alcohol-induced mood disorder: Principal | ICD-10-CM

## 2015-07-04 LAB — GLUCOSE, CAPILLARY
Glucose-Capillary: 434 mg/dL — ABNORMAL HIGH (ref 65–99)
Glucose-Capillary: 421 mg/dL — ABNORMAL HIGH (ref 65–99)
Glucose-Capillary: 332 mg/dL — ABNORMAL HIGH (ref 65–99)
Glucose-Capillary: 165 mg/dL — ABNORMAL HIGH (ref 65–99)
Glucose-Capillary: 265 mg/dL — ABNORMAL HIGH (ref 65–99)
Glucose-Capillary: 304 mg/dL — ABNORMAL HIGH (ref 65–99)

## 2015-07-04 MED ORDER — LOPERAMIDE HCL 2 MG PO CAPS
2.0000 mg | ORAL_CAPSULE | ORAL | Status: AC | PRN
  Administered 2015-07-06: 2 mg via ORAL
  Administered 2015-07-06: 4 mg via ORAL
  Administered 2015-07-06: 2 mg via ORAL
  Filled 2015-07-04 (×2): qty 1
  Filled 2015-07-04: qty 2
  Filled 2015-07-04: qty 1

## 2015-07-04 MED ORDER — CHLORDIAZEPOXIDE HCL 25 MG PO CAPS
25.0000 mg | ORAL_CAPSULE | Freq: Three times a day (TID) | ORAL | Status: AC
  Administered 2015-07-05 (×3): 25 mg via ORAL
  Filled 2015-07-04 (×2): qty 1

## 2015-07-04 MED ORDER — VITAMIN B-1 100 MG PO TABS
100.0000 mg | ORAL_TABLET | Freq: Every day | ORAL | Status: DC
  Administered 2015-07-05 – 2015-07-09 (×5): 100 mg via ORAL
  Filled 2015-07-04 (×7): qty 1

## 2015-07-04 MED ORDER — ONDANSETRON 4 MG PO TBDP: 4.0000 mg | ORAL_TABLET | Freq: Four times a day (QID) | ORAL | Status: AC | PRN

## 2015-07-04 MED ORDER — INSULIN ASPART 100 UNIT/ML ~~LOC~~ SOLN: 6.0000 [IU] | Freq: Once | SUBCUTANEOUS | Status: DC

## 2015-07-04 MED ORDER — ARIPIPRAZOLE 10 MG PO TABS
10.0000 mg | ORAL_TABLET | Freq: Every day | ORAL | Status: DC
  Administered 2015-07-05 – 2015-07-07 (×3): 10 mg via ORAL
  Filled 2015-07-04 (×4): qty 1

## 2015-07-04 MED ORDER — CHLORDIAZEPOXIDE HCL 25 MG PO CAPS
25.0000 mg | ORAL_CAPSULE | ORAL | Status: AC
  Administered 2015-07-06 (×2): 25 mg via ORAL
  Filled 2015-07-04 (×2): qty 1

## 2015-07-04 MED ORDER — INSULIN ASPART 100 UNIT/ML ~~LOC~~ SOLN
4.0000 [IU] | Freq: Once | SUBCUTANEOUS | Status: AC
  Administered 2015-07-04: 4 [IU] via SUBCUTANEOUS

## 2015-07-04 MED ORDER — THIAMINE HCL 100 MG/ML IJ SOLN: 100.0000 mg | Freq: Once | INTRAMUSCULAR | Status: DC

## 2015-07-04 MED ORDER — INSULIN GLARGINE 100 UNIT/ML ~~LOC~~ SOLN
42.0000 [IU] | Freq: Two times a day (BID) | SUBCUTANEOUS | Status: DC
  Administered 2015-07-04 – 2015-07-09 (×11): 42 [IU] via SUBCUTANEOUS

## 2015-07-04 MED ORDER — CHLORDIAZEPOXIDE HCL 25 MG PO CAPS
25.0000 mg | ORAL_CAPSULE | Freq: Every day | ORAL | Status: AC
  Administered 2015-07-07: 25 mg via ORAL
  Filled 2015-07-04: qty 1

## 2015-07-04 MED ORDER — CHLORDIAZEPOXIDE HCL 25 MG PO CAPS
25.0000 mg | ORAL_CAPSULE | Freq: Four times a day (QID) | ORAL | Status: AC
  Administered 2015-07-04 (×3): 25 mg via ORAL
  Filled 2015-07-04 (×4): qty 1

## 2015-07-04 MED ORDER — CHLORDIAZEPOXIDE HCL 25 MG PO CAPS
25.0000 mg | ORAL_CAPSULE | Freq: Four times a day (QID) | ORAL | Status: AC | PRN
  Administered 2015-07-04 – 2015-07-06 (×3): 25 mg via ORAL
  Filled 2015-07-04 (×4): qty 1

## 2015-07-04 MED ORDER — ADULT MULTIVITAMIN W/MINERALS CH
1.0000 | ORAL_TABLET | Freq: Every day | ORAL | Status: DC
  Administered 2015-07-04 – 2015-07-09 (×6): 1 via ORAL
  Filled 2015-07-04 (×9): qty 1

## 2015-07-04 NOTE — Tx Team (Signed)
Initial Interdisciplinary Treatment Plan   PATIENT STRESSORS: Financial difficulties Medication change or noncompliance Substance abuse   PATIENT STRENGTHS: General fund of knowledge Motivation for treatment/growth Supportive family/friends   PROBLEM LIST: Problem List/Patient Goals Date to be addressed Date deferred Reason deferred Estimated date of resolution  Risk for suicide 07/04/15     ETOH 07/04/15     depression 07/04/15     "work on depression" 07/04/15                                    DISCHARGE CRITERIA:  Improved stabilization in mood, thinking, and/or behavior Verbal commitment to aftercare and medication compliance  PRELIMINARY DISCHARGE PLAN: Attend aftercare/continuing care group Outpatient therapy  PATIENT/FAMIILY INVOLVEMENT: This treatment plan has been presented to and reviewed with the patient, Carolyn Ayala,   The patient and family have been given the opportunity to ask questions and make suggestions.  Jacques Navy A 07/04/2015, 12:50 AM
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
## 2015-07-04 NOTE — Progress Notes (Addendum)
Adult Psychoeducational Group Note  Date:  07/04/2015 Time:  8:15 PM  Group Topic/Focus:  Wrap-Up Group:   The focus of this group is to help patients review their daily goal of treatment and discuss progress on daily workbooks.  Participation Level:  Did Not Attend  Pt appeared to be asleep during wrap-up group.   Cleotilde Neer 07/04/2015, 8:28 PM

## 2015-07-04 NOTE — Progress Notes (Signed)
Patient ID: Carolyn Ayala, female   DOB: Nov 06, 1975, 40 y.o.   MRN: [id number]  Pt currently presents with a blunted affect and cooperative and suspicious behavior. Per self inventory, pt rates depression, hopelessness and anxiety at a 10. [name] daily goal is "getting on the right medications" and they intend to do so by "keep a positive attitude." Pt reports poor sleep, a fair appetite, low energy and poor concentration. Pt writes "I want Kathlene November to be my nurse tonight from 7:30 to 7:30."   Pt provided with medications per providers orders. [name] labs and vitals were monitored throughout the day. Pt supported emotionally and encouraged to express concerns and questions. Pt educated on medications.Pt found in other pts rooms, redirectable. Pt seen in hallway with gown open in the back, pt given scrubs, redirectable. No roommate ordered by MD.   [name] safety ensured with 15 minute and environmental checks. Pt endorses some passive SI. Pt currently denies HI and A/V hallucinations. Pt verbally agrees to seek staff if HI or A/VH occurs and to consult with staff if SI thoughts worsen or before acting on them. Will continue POC.

## 2015-07-04 NOTE — [facility] Group Notes (Signed)
[facility] LCSW Group Therapy  07/04/2015 10 AM  Type of Therapy:  Group Therapy  Participation Level:  None  Participation Quality:  Inattentive  Affect:  Flat  Cognitive:  Unable to assess  Insight:  Unable to assess  Engagement in Therapy:  None  Modes of Intervention:  Discussion, Exploration, Rapport Building, Socialization and Support  Summary of Progress/Problems:   Summary of Progress/Problems: The main focus of today's process group was for the patient to identify ways in which they have in the past sabotaged their own recovery. Motivational Interviewing was utilized to ask the group members what they get out of their self sabotaging behavior, and what reasons they may have for wanting to change. The Stages of Change were explained using a handout, and patients identified where they currently are with regard to stages of change. Patient shared during warmup that she had no intention of participating in group session although she remained present through out.    Clide Dales

## 2015-07-04 NOTE — Plan of Care (Signed)
Problem: Ineffective individual coping Goal: STG: Patient will remain free from self harm Outcome: Progressing Pt safe on the unit at this time     
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
## 2015-07-04 NOTE — Progress Notes (Signed)
D: Pt denies HI, Pt SI-contracts and AVH but appears to be tolerating the added stimulation of the 400 hall well. Pt is pleasant and cooperative. Pt stated she has a hard time in the dayroom with the stimulation of the other patients. Pt has issues with being here, pt wants to leave . Pt would like to go to a long term tx   A: Pt was offered support and encouragement. Pt was given scheduled medications. Pt was encourage to attend groups. Q 15 minute checks were done for safety.   R:Pt attends groups and interacts well with peers and staff. Pt is taking medication. Pt has no complaints at this time .Pt receptive to treatment and safety maintained on unit.

## 2015-07-04 NOTE — [facility] Suicide Risk Assessment (Signed)
[facility] INPATIENT:  Family/Significant Other Suicide Prevention Education  Suicide Prevention Education:  Patient Refusal for Family/Significant Other Suicide Prevention Education: The patient Carolyn Ayala has refused to provide written consent for family/significant other to be provided Family/Significant Other Suicide Prevention Education during admission and/or prior to discharge.  Physician notified.  Writer provided suicide prevention education directly to patient; conversation included risk factors, warning signs and resources to contact for help. Mobile crisis services explained and  explanations given as to resources which will be included on patient's discharge paperwork.  Clide Dales 07/04/2015 4:00 PM
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
## 2015-07-04 NOTE — Progress Notes (Signed)
Patient ID: Carolyn Ayala, female   DOB: [birth year], 40 y.o.   MRN: [id number]  Adult Psychoeducational Group Note  Date:  07/04/2015 Time: 1:30pm   Group Topic/Focus:  Recovery Goals:   The focus of this group is to identify appropriate goals for recovery and establish a plan to achieve them.  Participation Level: n/a  Participation Quality: n/a  Affect: n/a  Cognitive: n/a  Insight:n/a  Engagement in Group: n/a  Modes of Intervention:  Activity, Discussion, Education and Support  Additional Comments:  Pt choose not to attend group today.   Aurora Mask 07/04/2015, 6:54 PM

## 2015-07-05 LAB — CBC WITH DIFFERENTIAL/PLATELET
Basophils Absolute: 0 10*3/uL (ref 0.0–0.1)
Basophils Relative: 0 %
Eosinophils Absolute: 0.2 10*3/uL (ref 0.0–0.7)
Eosinophils Relative: 3 %
HCT: 37.3 % (ref 36.0–46.0)
Hemoglobin: 12.3 g/dL (ref 12.0–15.0)
Lymphocytes Relative: 48 %
Lymphs Abs: 3.4 10*3/uL (ref 0.7–4.0)
MCH: 25.4 pg — ABNORMAL LOW (ref 26.0–34.0)
MCHC: 33 g/dL (ref 30.0–36.0)
MCV: 77.1 fL — ABNORMAL LOW (ref 78.0–100.0)
Monocytes Absolute: 0.5 10*3/uL (ref 0.1–1.0)
Monocytes Relative: 7 %
Neutro Abs: 2.9 10*3/uL (ref 1.7–7.7)
Neutrophils Relative %: 42 %
Platelets: 273 10*3/uL (ref 150–400)
RBC: 4.84 MIL/uL (ref 3.87–5.11)
RDW: 16.2 % — ABNORMAL HIGH (ref 11.5–15.5)
WBC: 7 10*3/uL (ref 4.0–10.5)

## 2015-07-05 LAB — GLUCOSE, CAPILLARY
Glucose-Capillary: 151 mg/dL — ABNORMAL HIGH (ref 65–99)
Glucose-Capillary: 316 mg/dL — ABNORMAL HIGH (ref 65–99)
Glucose-Capillary: 333 mg/dL — ABNORMAL HIGH (ref 65–99)
Glucose-Capillary: 253 mg/dL — ABNORMAL HIGH (ref 65–99)

## 2015-07-05 LAB — LIPASE, BLOOD: Lipase: 47 U/L (ref 11–51)

## 2015-07-05 MED ORDER — MIRTAZAPINE 15 MG PO TABS
15.0000 mg | ORAL_TABLET | Freq: Every day | ORAL | Status: DC
  Administered 2015-07-06 – 2015-07-08 (×2): 15 mg via ORAL
  Filled 2015-07-05 (×5): qty 1
  Filled 2015-07-05: qty 14

## 2015-07-05 MED ORDER — IBUPROFEN 600 MG PO TABS
600.0000 mg | ORAL_TABLET | Freq: Four times a day (QID) | ORAL | Status: DC | PRN
  Administered 2015-07-05 – 2015-07-09 (×4): 600 mg via ORAL
  Filled 2015-07-05 (×4): qty 1

## 2015-07-05 MED ORDER — GUAIFENESIN ER 600 MG PO TB12
600.0000 mg | ORAL_TABLET | Freq: Two times a day (BID) | ORAL | Status: DC | PRN
  Administered 2015-07-05 – 2015-07-09 (×8): 600 mg via ORAL
  Filled 2015-07-05 (×5): qty 1

## 2015-07-05 MED ORDER — ACAMPROSATE CALCIUM 333 MG PO TBEC
666.0000 mg | DELAYED_RELEASE_TABLET | Freq: Three times a day (TID) | ORAL | Status: DC
  Administered 2015-07-08 – 2015-07-09 (×4): 666 mg via ORAL
  Filled 2015-07-05 (×5): qty 2
  Filled 2015-07-05: qty 84
  Filled 2015-07-05 (×6): qty 2
  Filled 2015-07-05: qty 84
  Filled 2015-07-05 (×3): qty 2
  Filled 2015-07-05: qty 84
  Filled 2015-07-05: qty 2

## 2015-07-05 MED ORDER — MENTHOL 3 MG MT LOZG
1.0000 | LOZENGE | OROMUCOSAL | Status: DC | PRN
  Administered 2015-07-06: 3 mg via ORAL

## 2015-07-05 NOTE — Progress Notes (Signed)
Patient ID: Carolyn Ayala, female   DOB: [birth year], 40 y.o.   MRN: [id number]  Pt seen hurrying to her room after dinner. Pt had a snickers bar and two bags of fruit gummies under her arms. Pt was sucking on a Chief of Staff. Pt educated on carb modified diet and encouraged to pick healthier options.

## 2015-07-05 NOTE — [facility] Group Notes (Signed)
[facility] LCSW Group Therapy  07/05/2015 10:00 AM  Type of Therapy:  Group Therapy  Participation Level:  None other than be physically present and perhaps listening  Participation Quality:  Physically present; perhaps listening  Affect:  Flat  Cognitive:  Alert and Oriented  Insight:  None shared  Engagement in Therapy:  None nioted  Modes of Intervention:  Discussion, Education, Exploration, Problem-solving, Socialization and Support  Summary of Progress/Problems: The main focus of today's process group was to identify the patient's current support system and decide on other supports that can be put in place. An emphasis was placed on using counselor, doctor, therapy groups, and problem-specific support groups to expand supports. There was also an extensive discussion about what constitutes a healthy support versus an unhealthy support. The patient was physically present acknowledged her name and chose not to participate   Carolyn Bern, LCSW

## 2015-07-05 NOTE — Progress Notes (Addendum)
[facility] MD Progress Note  07/05/2015 11:21 AM Carolyn Ayala  MRN:  [id number] Subjective:   Patient reports she is feeling " a little better", but still reports anxiety and some depression. At this time does not endorse medication side effects. Objective : I have reviewed chart notes and have met with patient. She presents anxious, seeking reassurance , support, but states she is feeling better than yesterday. At this time no significant withdrawal symptoms- no tremors,no diaphoresis, and vitals are stable. Patient has been visible on unit, no disruptive behaviors on unit. Group participation has been limited, and has reported feeling apprehensive regarding unit activities and level of noise from other patients. States that hallucinations have subsided , and at this time does not appear internally preoccupied. Responds partially to reassurance , support. She denies suicidal ideations. She is future oriented and at this time expresses interest in going to a rehab setting on discharge. Does not endorse medication side effects. Principal Problem: Alcoholism with alcohol dependence ([place]) Diagnosis:   Patient Active Problem List   Diagnosis Date Noted  . Major depressive disorder, recurrent episode, severe, with psychotic behavior ([place]) [F33.3] 07/03/2015  . Bipolar disorder, curr episode mixed, severe, with psychotic features ([place]) [F31.64] 05/01/2015  . Uncontrolled diabetes mellitus with neurologic complication, with long-term current use of insulin ([facility]) [E11.49, Z79.4, E11.65]   . Uncontrolled diabetes mellitus with diabetic neuropathy, with long-term current use of insulin ([place]) [E11.40, Z79.4, E11.65] 04/21/2015  . PTSD (post-traumatic stress disorder) [F43.10] 04/20/2015  . Alcohol dependence with withdrawal, uncomplicated ([place]) [L84.536] 04/20/2015  . Suicidal ideation [R45.851] 04/19/2015  . Recent bereavement [Z78.9] 04/19/2015  . Generalized anxiety disorder [F41.1] 02/13/2013   . Chronic posttraumatic stress disorder [F43.12] 02/13/2013  . Alcoholism with alcohol dependence ([place]) [F10.20] 02/08/2013  . Substance induced mood disorder ([place]) [F19.94] 02/08/2013  . IDDM (insulin dependent diabetes mellitus) ([place]) [E11.9, Z79.4] 02/08/2013   Total Time spent with patient: 20 minutes    Past Medical History:  Past Medical History  Diagnosis Date  . Mental disorder   . Depression   . Diabetes mellitus without complication ([place])   . Hypertension     Past Surgical History  Procedure Laterality Date  . Cesarean section     Family History:  Family History  Problem Relation Age of Onset  . Depression Mother     Social History:  History  Alcohol Use  . Yes    Comment: daily     History  Drug Use No    Social History   Social History  . Marital Status: Single    Spouse Name: N/A  . Number of Children: N/A  . Years of Education: N/A   Social History Main Topics  . Smoking status: Never Smoker   . Smokeless tobacco: Current User  . Alcohol Use: Yes     Comment: daily  . Drug Use: No  . Sexual Activity: No   Other Topics Concern  . None   Social History Narrative   ** Merged History Encounter **       Additional Social History:   Sleep: improved  Appetite:  Good  Current Medications: Current Facility-Administered Medications  Medication Dose Route Frequency Provider Last Rate Last Dose  . acamprosate (CAMPRAL) tablet 666 mg  666 mg Oral TID WC Myer Peer Cobos, MD      . acetaminophen (TYLENOL) tablet 650 mg  650 mg Oral Q6H PRN Harriet Butte, NP   650 mg at 07/05/15 0947  .  alum & mag hydroxide-simeth (MAALOX/MYLANTA) 200-200-20 MG/5ML suspension 30 mL  30 mL Oral Q4H PRN Harriet Butte, NP      . ARIPiprazole (ABILIFY) tablet 10 mg  10 mg Oral Daily Jenne Campus, MD   10 mg at 07/05/15 0759  . capsaicin (ZOSTRIX) 0.025 % cream   Topical BID Harriet Butte, NP      . chlordiazePOXIDE (LIBRIUM) capsule 25 mg  25 mg Oral Q6H  PRN Jenne Campus, MD   25 mg at 07/04/15 1841  . chlordiazePOXIDE (LIBRIUM) capsule 25 mg  25 mg Oral TID Jenne Campus, MD   25 mg at 07/05/15 0758   Followed by  . [START ON 07/06/2015] chlordiazePOXIDE (LIBRIUM) capsule 25 mg  25 mg Oral BH-qamhs Jenne Campus, MD       Followed by  . [START ON 07/07/2015] chlordiazePOXIDE (LIBRIUM) capsule 25 mg  25 mg Oral Daily Fernando A Cobos, MD      . ferrous sulfate tablet 325 mg  325 mg Oral Q breakfast Harriet Butte, NP   325 mg at 07/05/15 0759  . FLUoxetine (PROZAC) capsule 20 mg  20 mg Oral Daily Harriet Butte, NP   20 mg at 07/05/15 0759  . gabapentin (NEURONTIN) capsule 900 mg  900 mg Oral TID PC & HS Harriet Butte, NP   900 mg at 07/05/15 0801  . guaiFENesin (MUCINEX) 12 hr tablet 600 mg  600 mg Oral BID PRN Jenne Campus, MD      . hydrOXYzine (ATARAX/VISTARIL) tablet 50 mg  50 mg Oral BID PRN Harriet Butte, NP   50 mg at 07/05/15 0616  . insulin aspart (novoLOG) injection 15 Units  15 Units Subcutaneous TID WC Harriet Butte, NP   15 Units at 07/04/15 1734  . insulin glargine (LANTUS) injection 42 Units  42 Units Subcutaneous BID Harriet Butte, NP   42 Units at 07/05/15 0803  . lisinopril (PRINIVIL,ZESTRIL) tablet 5 mg  5 mg Oral Daily Harriet Butte, NP   5 mg at 07/05/15 0759  . loperamide (IMODIUM) capsule 2-4 mg  2-4 mg Oral PRN Jenne Campus, MD      . loratadine (CLARITIN) tablet 10 mg  10 mg Oral q morning - 10a Harriet Butte, NP   10 mg at 07/05/15 0946  . magnesium hydroxide (MILK OF MAGNESIA) suspension 30 mL  30 mL Oral Daily PRN Harriet Butte, NP      . metFORMIN (GLUCOPHAGE) tablet 1,000 mg  1,000 mg Oral BID WC Harriet Butte, NP   1,000 mg at 07/05/15 0800  . mirtazapine (REMERON) tablet 15 mg  15 mg Oral QHS Myer Peer Cobos, MD      . multivitamin with minerals tablet 1 tablet  1 tablet Oral Daily Jenne Campus, MD   1 tablet at 07/05/15 0800  . ondansetron (ZOFRAN-ODT) disintegrating tablet  4 mg  4 mg Oral Q6H PRN Jenne Campus, MD      . thiamine (VITAMIN B-1) tablet 100 mg  100 mg Oral Daily Jenne Campus, MD   100 mg at 07/05/15 0800    Lab Results:  Results for orders placed or performed during the hospital encounter of 07/03/15 (from the past 48 hour(s))  Glucose, capillary     Status: Abnormal   Collection Time: 07/03/15  9:04 PM  Result Value Ref Range   Glucose-Capillary 314 (H) 65 - 99 mg/dL  Glucose, capillary     Status: Abnormal   Collection Time: 07/04/15  6:08 AM  Result Value Ref Range   Glucose-Capillary 434 (H) 65 - 99 mg/dL  Glucose, capillary     Status: Abnormal   Collection Time: 07/04/15  6:30 AM  Result Value Ref Range   Glucose-Capillary 421 (H) 65 - 99 mg/dL  Glucose, capillary     Status: Abnormal   Collection Time: 07/04/15  7:47 AM  Result Value Ref Range   Glucose-Capillary 332 (H) 65 - 99 mg/dL   Comment 1 Notify RN    Comment 2 Document in Chart   Glucose, capillary     Status: Abnormal   Collection Time: 07/04/15 11:51 AM  Result Value Ref Range   Glucose-Capillary 165 (H) 65 - 99 mg/dL   Comment 1 Notify RN    Comment 2 Document in Chart   Glucose, capillary     Status: Abnormal   Collection Time: 07/04/15  5:26 PM  Result Value Ref Range   Glucose-Capillary 265 (H) 65 - 99 mg/dL  Glucose, capillary     Status: Abnormal   Collection Time: 07/04/15  9:04 PM  Result Value Ref Range   Glucose-Capillary 304 (H) 65 - 99 mg/dL  Glucose, capillary     Status: Abnormal   Collection Time: 07/05/15  6:09 AM  Result Value Ref Range   Glucose-Capillary 151 (H) 65 - 99 mg/dL    Physical Findings: AIMS:  , ,  ,  ,    CIWA:    COWS:     Musculoskeletal: Strength & Muscle Tone: within normal limits at this time no noticeable tremors or diaphoresis, no restlessness  Gait & Station: normal Patient leans: N/A  Psychiatric Specialty Exam: ROS denies headache, denies shortness of breath, no chest pain, describes vague lower left  quadrant discomfort, denies constipation or melenas , no vomiting   Blood pressure 98/71, pulse 89, temperature 99.4 F (37.4 C), temperature source Oral, resp. rate 16, height _0  (1.549 m), weight 197 lb (89.359 kg), last menstrual period 06/12/2015.Body mass index is 37.24 kg/(m^2).  General Appearance: Fairly Groomed  Engineer, water::  Good  Speech:  Normal Rate  Volume:  Normal  Mood:  Anxious and slightly depressed   Affect:  Constricted and but reactive   Thought Process:  Linear  Orientation:  Other:  fully alert and attentive   Thought Content:   Had reported auditory hallucinations on admission- these are currently improving and does not appear internally preoccupied at this time , no delusions expressed, but does express vague sense of apprehension, fear.  Suicidal Thoughts:  No- at this time denies any suicidal ideations or any self injurious ideations   Homicidal Thoughts:  No  Memory:  recent and remote grossly intact   Judgement:  Fair  Insight:  Present  Psychomotor Activity:  Normal- no tremors, no restlessness   Concentration:  Good  Recall:  Good  Fund of Knowledge:Good  Language: Good  Akathisia:  Negative  Handed:  Right  AIMS (if indicated):     Assets:  Desire for Improvement Resilience  ADL's: improved   Cognition: WNL  Sleep:  Number of Hours: 6  Assessment - patient presents  Somewhat anxious and depressed, reports vague sense of apprehension. Responds partially to support, reassurance. Visible in milieu but little group participation thus far. Thus far not presenting with any significant symptoms of WDL and vitals are stable .  Tolerating Prozac, Neurontin , Abilify,  Remeron  well  Treatment Plan Summary: Daily contact with patient to assess and evaluate symptoms and progress in treatment, Medication management, Plan inpatient admission and medications as below  Continue to encourage milieu, group participation to work on coping skills and symptom  reduction Continue to encourage and support early recovery and relapse prevention efforts Continue Prozac 20 mgrs QDAY for depression and anxiety Continue Abilify 10 mgrs QDAY for mood disorder  Continue Neurontin 900 mgrs TID for anxiety, pain Continue Librium detox protocol to minimize risk of alcohol WDL symptoms Continue GLucophage and Lantus Insulin for management of DM. Repeat Lipase .  COBOS, Schulter 07/05/2015, 11:21 AM

## 2015-07-05 NOTE — Progress Notes (Signed)
Patient ID: Carolyn Ayala, female   DOB: Sep 23, 1975, 41 y.o.   MRN: [id number]  Pt currently presents with a flat affect and guarded behavior. Per self inventory, pt rates depression, hopelessness and anxiety at a 10. [name] daily goal is to "talk about being discharged very soon if doctor tell me i'm ready" and they intend to do so by "talk to doctor and listen." Pt reports good sleep, a good appetite, normal energy and good concentration. Female student RN talked with pt, reported pt turned conversation to sexual topics more than once. Pt reports to Clinical research associate "I love you" numerous times today. Pt is cooperative in interaction with staff. Pt paranoid, asks writer to give her insulin in her room because "I don't know that girl", referring to a newly admitted pt sitting in the dayroom. Pt attends groups today.   Pt provided with medications per providers orders. [name] labs and vitals were monitored throughout the day. Pt supported emotionally and encouraged to express concerns and questions. Pt educated on medications and alternative pain relief techniques. Pt educated on diet and importance of adhering to insulin orders from MD.  [name] safety ensured with 15 minute and environmental checks. Pt currently denies SI/HI and A/V hallucinations. Pt verbally agrees to seek staff if SI/HI or A/VH occurs and to consult with staff before acting on these thoughts. Pt focused on personal relationships post discharge. Pt reports that SI would keep her from following her discharge plan. Will continue POC.

## 2015-07-06 DIAGNOSIS — F333 Major depressive disorder, recurrent, severe with psychotic symptoms: Secondary | ICD-10-CM

## 2015-07-06 LAB — GLUCOSE, CAPILLARY
Glucose-Capillary: 349 mg/dL — ABNORMAL HIGH (ref 65–99)
Glucose-Capillary: 68 mg/dL (ref 65–99)
Glucose-Capillary: 72 mg/dL (ref 65–99)
Glucose-Capillary: 272 mg/dL — ABNORMAL HIGH (ref 65–99)
Glucose-Capillary: 239 mg/dL — ABNORMAL HIGH (ref 65–99)
Glucose-Capillary: 133 mg/dL — ABNORMAL HIGH (ref 65–99)

## 2015-07-06 MED ORDER — CYCLOBENZAPRINE HCL 5 MG PO TABS
5.0000 mg | ORAL_TABLET | Freq: Two times a day (BID) | ORAL | Status: AC
  Administered 2015-07-06 – 2015-07-08 (×4): 5 mg via ORAL
  Filled 2015-07-06 (×4): qty 1

## 2015-07-06 NOTE — Progress Notes (Signed)
Patient ID: Sitara Cashwell, female   DOB: Jul 18, 1975, 40 y.o.   MRN: [id number] Hypoglycemic Event  CBG: 68  Treatment: 15 GM carbohydrate snack  Symptoms: None  Follow-up CBG: Time: 1206 CBG Result:72  Possible Reasons for Event: Inadequate meal intake  Comments/MD notified: Pt reported no signs/symptoms. Pt given peanut butter and saltines. Pt given milk and meal after follow up CBG given. Hillery Jacks, NP notified. Will continue to monitor.     Aurora Mask

## 2015-07-06 NOTE — [facility] Group Notes (Signed)
[facility] LCSW Aftercare Discharge Planning Group Note  07/06/2015 8:45 AM  Participation Quality: Alert, Appropriate and Oriented  Mood/Affect: Flat  Depression Rating: 0  Anxiety Rating: 0  Thoughts of Suicide: Pt denies SI/HI  Will you contract for safety? Yes  Current AVH: Pt denies  Plan for Discharge/Comments: Pt attended discharge planning group and actively participated in group. CSW discussed suicide prevention education with the group and encouraged them to discuss discharge planning and any relevant barriers. Pt appears irritable and guarded but denies depressive symptoms.   Transportation Means: Pt reports access to transportation  Supports: No supports mentioned at this time  Chad Cordial, LCSWA 07/06/2015 9:30 AM

## 2015-07-06 NOTE — Progress Notes (Signed)
Adult Psychoeducational Group Note  Date:  07/06/2015 Time:  10:12 PM  Group Topic/Focus:  Wrap-Up Group:   The focus of this group is to help patients review their daily goal of treatment and discuss progress on daily workbooks.  Participation Level:  Active  Participation Quality:  Appropriate  Affect:  Appropriate  Cognitive:  Alert  Insight: Appropriate  Engagement in Group:  Engaged  Modes of Intervention:  Discussion  Additional Comments:  Patient stated having a good day. Patient stated her goal for today was to talk with the doctor, but talked with a nurse practitioner instead. Patient stated something positive that happened today was "I woke up".  Mckennah Kretchmer L Channing Savich 07/06/2015, 10:12 PM

## 2015-07-06 NOTE — [facility] Group Notes (Signed)
[facility] LCSW Group Therapy  07/06/2015 1:15pm  Type of Therapy:  Group Therapy vercoming Obstacles  Participation Level:  Minimal  Participation Quality:  Appropriate   Affect:  Appropriate  Cognitive:  Appropriate and Oriented  Insight:  Developing/Improving  Engagement in Therapy:  Improving  Modes of Intervention:  Discussion, Exploration, Problem-solving and Support  Description of Group:   In this group patients will be encouraged to explore what they see as obstacles to their own wellness and recovery. They will be guided to discuss their thoughts, feelings, and behaviors related to these obstacles. The group will process together ways to cope with barriers, with attention given to specific choices patients can make. Each patient will be challenged to identify changes they are motivated to make in order to overcome their obstacles. This group will be process-oriented, with patients participating in exploration of their own experiences as well as giving and receiving support and challenge from other group members.  Summary of Patient Progress: Pt was more engaged in this afternoon's group session but continues to participate minimally. Pt identified her alcohol use as an obstacle as it negatively effects her ability and willingness to continue treatment on the outpatient basis.  Pt identified need for self-motivation in order to overcome obstacles.   Therapeutic Modalities:   Cognitive Behavioral Therapy Solution Focused Therapy Motivational Interviewing Relapse Prevention Therapy   Chad Cordial, LCSWA 07/06/2015 2:19 PM

## 2015-07-06 NOTE — Progress Notes (Signed)
D: Pt was unable to give a direct answer on the presence of suicidal ideation. "I don't know what to think". Pt was encouraged to not focus on such and that she should alert staff if she is suicidal. Pt agrees to do such. Pt was visible within the milieu but with minimal interactions with others. Pt presented blunted in affect with a depressed and anxious mood. Pt denied any HI/AVH. Writer spoke about the importance of staying compliant with her DM regimen. Pt refused her scheduled mirtazapine at the scheduled time. Pt reported a plan to seek writer after midnight. Pt was asleep at midnight. Pt  A: Writer administered scheduled and prn medications to pt, per MD orders. Continued support and availability as needed was extended to this pt. Staff continues to monitor pt with q48min checks.  R: No adverse drug reactions noted. Pt receptive to treatment. Pt remains safe at this time.

## 2015-07-06 NOTE — Progress Notes (Signed)
[facility] MD Progress Note  07/06/2015 1:04 PM Carolyn Ayala  MRN:  [id number] Subjective:   Patient has multiple complaints, reports she is feeling " ill allover and  I am still having anxiety and and depression".  Patient reports "pain/ache" to left lower abdomen for the past the 3 days.   Objective:  is awake, alert and oriented X3 , found attending resting in bedroom.  Denies suicidal or homicidal ideation. Denies auditory or visual hallucination and does not appear to be responding to internal stimuli. Patient reports interacting well with staff and others. Patient reports she is medication compliant without mediation side effects. States her depression 8/10 reports a recent passing of her mother. Patient states "I am feeling well today, but I might have the flu." Report experiencing diarrhea that started last night. " Patient states she thinks her diarrhea is from her metformin. Report pain to the left lower abdomen states the pain/ache feels like a pulled muscle." Reports that the pain started 3 days while siting around. Denies taking any medication for the pain. Reports good appetite and is resting okay. Support, encouragement and reassurance was provided.   Principal Problem: Alcoholism with alcohol dependence (HCC) Diagnosis:   Patient Active Problem List   Diagnosis Date Noted  . IDDM (insulin dependent diabetes mellitus) (HCC) [E11.9, Z79.4] 02/08/2013    Priority: Medium  . 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 Bipolar disorder, curr episode mixed, severe, with psychotic features (HCC) [F31.64] 05/01/2015  . Uncontrolled diabetes mellitus with neurologic complication, with long-term current use of insulin (HCC) [E11.49, Z79.4, E11.65]   . Uncontrolled diabetes mellitus with diabetic neuropathy, with long-term current use of insulin (HCC) [E11.40, Z79.4, E11.65] 04/21/2015  . PTSD (post-traumatic stress disorder)  [F43.10] 04/20/2015  . Alcohol dependence with withdrawal, uncomplicated (HCC) [F10.230] 04/20/2015  . Suicidal ideation [R45.851] 04/19/2015  . Recent bereavement [Z78.9] 04/19/2015  . Generalized anxiety disorder [F41.1] 02/13/2013  . Chronic posttraumatic stress disorder [F43.12] 02/13/2013  . Alcoholism with alcohol dependence (HCC) [F10.20] 02/08/2013  . Substance induced mood disorder (HCC) [F19.94] 02/08/2013   Total Time spent with patient: 20 minutes    Past Medical History:  Past Medical History  Diagnosis Date  . Mental disorder   . Depression   . Diabetes mellitus without complication (HCC)   . Hypertension     Past Surgical History  Procedure Laterality Date  . Cesarean section     Family History:  Family History  Problem Relation Age of Onset  . Depression Mother     Social History:  History  Alcohol Use  . Yes    Comment: daily     History  Drug Use No    Social History   Social History  . Marital Status: Single    Spouse Name: N/A  . Number of Children: N/A  . Years of Education: N/A   Social History Main Topics  . Smoking status: Never Smoker   . Smokeless tobacco: Current User  . Alcohol Use: Yes     Comment: daily  . Drug Use: No  . Sexual Activity: No   Other Topics Concern  . None   Social History Narrative   ** Merged History Encounter **       Additional Social History:   Sleep: improved  Appetite:  Good  Current Medications: Current Facility-Administered Medications  Medication Dose Route Frequency Provider Last Rate Last Dose  . acamprosate (CAMPRAL) tablet 666 mg  666  mg Oral TID WC Rockey Situ Cobos, MD   666 mg at 07/05/15 1200  . acetaminophen (TYLENOL) tablet 650 mg  650 mg Oral Q6H PRN Worthy Flank, NP   650 mg at 07/05/15 0947  . alum & mag hydroxide-simeth (MAALOX/MYLANTA) 200-200-20 MG/5ML suspension 30 mL  30 mL Oral Q4H PRN Worthy Flank, NP      . ARIPiprazole (ABILIFY) tablet 10 mg  10 mg Oral Daily  Craige Cotta, MD   10 mg at 07/06/15 0803  . capsaicin (ZOSTRIX) 0.025 % cream   Topical BID Worthy Flank, NP      . chlordiazePOXIDE (LIBRIUM) capsule 25 mg  25 mg Oral Q6H PRN Craige Cotta, MD   25 mg at 07/06/15 0954  . chlordiazePOXIDE (LIBRIUM) capsule 25 mg  25 mg Oral BH-qamhs Craige Cotta, MD   25 mg at 07/06/15 0802   Followed by  . [START ON 07/07/2015] chlordiazePOXIDE (LIBRIUM) capsule 25 mg  25 mg Oral Daily Fernando A Cobos, MD      . ferrous sulfate tablet 325 mg  325 mg Oral Q breakfast Worthy Flank, NP   325 mg at 07/06/15 0803  . FLUoxetine (PROZAC) capsule 20 mg  20 mg Oral Daily Worthy Flank, NP   20 mg at 07/06/15 0802  . gabapentin (NEURONTIN) capsule 900 mg  900 mg Oral TID PC & HS Worthy Flank, NP   900 mg at 07/06/15 1227  . guaiFENesin (MUCINEX) 12 hr tablet 600 mg  600 mg Oral BID PRN Craige Cotta, MD   600 mg at 07/06/15 1227  . hydrOXYzine (ATARAX/VISTARIL) tablet 50 mg  50 mg Oral BID PRN Worthy Flank, NP   50 mg at 07/06/15 0802  . ibuprofen (ADVIL,MOTRIN) tablet 600 mg  600 mg Oral Q6H PRN Worthy Flank, NP   600 mg at 07/05/15 2151  . insulin aspart (novoLOG) injection 15 Units  15 Units Subcutaneous TID WC Worthy Flank, NP   15 Units at 07/06/15 [phone]  . insulin glargine (LANTUS) injection 42 Units  42 Units Subcutaneous BID Worthy Flank, NP   42 Units at 07/06/15 0807  . lisinopril (PRINIVIL,ZESTRIL) tablet 5 mg  5 mg Oral Daily Worthy Flank, NP   5 mg at 07/06/15 0807  . loperamide (IMODIUM) capsule 2-4 mg  2-4 mg Oral PRN Craige Cotta, MD   2 mg at 07/06/15 0804  . loratadine (CLARITIN) tablet 10 mg  10 mg Oral q morning - 10a Worthy Flank, NP   10 mg at 07/06/15 0803  . magnesium hydroxide (MILK OF MAGNESIA) suspension 30 mL  30 mL Oral Daily PRN Worthy Flank, NP      . menthol-cetylpyridinium (CEPACOL) lozenge 3 mg  1 lozenge Oral PRN Adonis Brook, NP      . metFORMIN (GLUCOPHAGE) tablet 1,000 mg  1,000 mg  Oral BID WC Worthy Flank, NP   1,000 mg at 07/06/15 0803  . mirtazapine (REMERON) tablet 15 mg  15 mg Oral QHS Craige Cotta, MD   15 mg at 07/05/15 2200  . multivitamin with minerals tablet 1 tablet  1 tablet Oral Daily Craige Cotta, MD   1 tablet at 07/06/15 0803  . ondansetron (ZOFRAN-ODT) disintegrating tablet 4 mg  4 mg Oral Q6H PRN Rockey Situ Cobos, MD      . thiamine (VITAMIN B-1) tablet 100 mg  100 mg Oral Daily  Craige Cotta, MD   100 mg at 07/06/15 4098    Lab Results:  Results for orders placed or performed during the hospital encounter of 07/03/15 (from the past 48 hour(s))  Glucose, capillary     Status: Abnormal   Collection Time: 07/04/15  5:26 PM  Result Value Ref Range   Glucose-Capillary 265 (H) 65 - 99 mg/dL  Glucose, capillary     Status: Abnormal   Collection Time: 07/04/15  9:04 PM  Result Value Ref Range   Glucose-Capillary 304 (H) 65 - 99 mg/dL  Glucose, capillary     Status: Abnormal   Collection Time: 07/05/15  6:09 AM  Result Value Ref Range   Glucose-Capillary 151 (H) 65 - 99 mg/dL  Glucose, capillary     Status: Abnormal   Collection Time: 07/05/15 11:47 AM  Result Value Ref Range   Glucose-Capillary 316 (H) 65 - 99 mg/dL  Glucose, capillary     Status: Abnormal   Collection Time: 07/05/15  4:48 PM  Result Value Ref Range   Glucose-Capillary 333 (H) 65 - 99 mg/dL  Lipase, blood     Status: None   Collection Time: 07/05/15  6:31 PM  Result Value Ref Range   Lipase 47 11 - 51 U/L    Comment: Performed at [facility]  CBC with Differential/Platelet     Status: Abnormal   Collection Time: 07/05/15  6:31 PM  Result Value Ref Range   WBC 7.0 4.0 - 10.5 K/uL   RBC 4.84 3.87 - 5.11 MIL/uL   Hemoglobin 12.3 12.0 - 15.0 g/dL   HCT 11.9 14.7 - 82.9 %   MCV 77.1 (L) 78.0 - 100.0 fL   MCH 25.4 (L) 26.0 - 34.0 pg   MCHC 33.0 30.0 - 36.0 g/dL   RDW 56.2 (H) 13.0 - 86.5 %   Platelets 273 150 - 400 K/uL   Neutrophils Relative %  42 %   Neutro Abs 2.9 1.7 - 7.7 K/uL   Lymphocytes Relative 48 %   Lymphs Abs 3.4 0.7 - 4.0 K/uL   Monocytes Relative 7 %   Monocytes Absolute 0.5 0.1 - 1.0 K/uL   Eosinophils Relative 3 %   Eosinophils Absolute 0.2 0.0 - 0.7 K/uL   Basophils Relative 0 %   Basophils Absolute 0.0 0.0 - 0.1 K/uL    Comment: Performed at [facility]  Glucose, capillary     Status: Abnormal   Collection Time: 07/05/15  8:28 PM  Result Value Ref Range   Glucose-Capillary 253 (H) 65 - 99 mg/dL   Comment 1 Notify RN   Glucose, capillary     Status: Abnormal   Collection Time: 07/06/15  6:31 AM  Result Value Ref Range   Glucose-Capillary 349 (H) 65 - 99 mg/dL  Glucose, capillary     Status: None   Collection Time: 07/06/15 11:50 AM  Result Value Ref Range   Glucose-Capillary 68 65 - 99 mg/dL  Glucose, capillary     Status: None   Collection Time: 07/06/15 12:06 PM  Result Value Ref Range   Glucose-Capillary 72 65 - 99 mg/dL    Physical Findings: AIMS:  , ,  ,  ,    CIWA:    COWS:     Musculoskeletal: Strength & Muscle Tone: within normal limits at this time no noticeable tremors or diaphoresis, no restlessness  Gait & Station: normal Patient leans: N/A  Psychiatric Specialty Exam: Review of Systems  Respiratory:  Positive for cough.   Gastrointestinal: Positive for nausea and abdominal pain.       Lower abdominal pain, diarrhea.   Musculoskeletal: Positive for myalgias.  Neurological: Positive for headaches.  Psychiatric/Behavioral: Positive for depression and substance abuse. Negative for suicidal ideas. The patient is nervous/anxious and has insomnia.   All other systems reviewed and are negative.  denies headache, denies shortness of breath, no chest pain, describes vague lower left quadrant discomfort, denies constipation or melenas , no vomiting   Blood pressure 110/70, pulse 115, temperature 97.9 F (36.6 C), temperature source Oral, resp. rate 18, height 5\' 1"   (1.549 m), weight 89.359 kg (197 lb), last menstrual period 06/12/2015.Body mass index is 37.24 kg/(m^2).  General Appearance: Fairly Groomed  Patent attorney::  Good  Speech:  Normal Rate  Volume:  Normal  Mood:  Anxious and slightly depressed   Affect:  Constricted and but reactive   Thought Process:  Linear  Orientation:  Other:  fully alert and attentive   Thought Content:   denies auditory hallucinations at this time. Patient appears to have some thought blocking.   Suicidal Thoughts:  No- at this time denies any suicidal ideations or any self injurious ideations   Homicidal Thoughts:  No  Memory:  recent and remote grossly intact   Judgement:  Fair  Insight:  Present  Psychomotor Activity:  Normal- no tremors, no restlessness   Concentration:  Good  Recall:  Good  Fund of Knowledge:Good  Language: Good  Akathisia:  Negative  Handed:  Right  AIMS (if indicated):     Assets:  Desire for Improvement Resilience  ADL's: improved   Cognition: WNL  Sleep:  Number of Hours: 4.25    Treatment Plan Summary: Daily contact with patient to assess and evaluate symptoms and progress in treatment, Medication management, Plan inpatient admission and medications as below  Continue to encourage milieu, group participation to work on coping skills and symptom reduction Encourage hand hygiene and increased fluid intake.  Consider Internal Medicine Consult for Abdominal Pain. Labs reviewed- Lipase 47( WNL) Started flexeril 5 mg Po BID X 2 days for left leg/muscle aches Continue Imodium 2-4mg s PO PRN for diarrhea Continue to encourage and support early recovery and relapse prevention efforts Continue Prozac 20 mgrs QDAY for depression and anxiety Continue Abilify 10 mgrs QDAY for mood disorder  Continue Neurontin 900 mgrs TID for anxiety, pain Continue Librium detox protocol to minimize risk of alcohol WDL symptoms Continue GLucophage and Lantus Insulin for management of DM. Repeat Lipase/  completed 47 (WNL) .   Jerene Pitch Lewis-FNP- BC 07/06/2015, 1:04 PM I agree with assessment and plan Madie Reno A. Dub Mikes, M.D.

## 2015-07-06 NOTE — Tx Team (Signed)
Interdisciplinary Treatment Plan Update (Adult) Date: 07/06/2015   Date: 07/06/2015 10:04 AM  Progress in Treatment:  Attending groups: Yes  Participating in groups: Yes  Taking medication as prescribed: Yes  Tolerating medication: Yes  Family/Significant othe contact made: No, Pt declines Patient understands diagnosis: Yes AEB seeking help with depresion Discussing patient identified problems/goals with staff: Yes  Medical problems stabilized or resolved: Yes  Denies suicidal/homicidal ideation: Yes Patient has not harmed self or Others: Yes   New problem(s) identified: None identified at this time.   Discharge Plan or Barriers: Pt will return home and follow-up with [facility] and [facility].  Additional comments:  Patient and CSW reviewed pt's identified goals and treatment plan. Patient verbalized understanding and agreed to treatment plan. CSW reviewed [facility] "Discharge Process and Patient Involvement" Form. Pt verbalized understanding of information provided and signed form.   Reason for Continuation of Hospitalization:  Depression Medication stabilization Suicidal ideation Withdrawal symptoms  Estimated length of stay: 1-2 days  Review of initial/current patient goals per problem list:   1.  Goal(s): Patient will participate in aftercare plan  Met:  Yes  Target date: 3-5 days from date of admission   As evidenced by: Patient will participate within aftercare plan AEB aftercare provider and housing plan at discharge being identified.   07/06/15: Pt will return home and follow-up with [facility] and [facility].  2.  Goal (s): Patient will exhibit decreased depressive symptoms and suicidal ideations.  Met:  Yes  Target date: 3-5 days from date of admission   As evidenced by: Patient will utilize self rating of depression at 3 or below and demonstrate decreased signs of depression or be deemed stable for discharge by MD.  07/06/15: Pt rates depression at 0/10; denies SI 4.  Goal(s):  Patient will demonstrate decreased signs of withdrawal due to substance abuse  Met:  Progressing  Target date: 3-5 days from date of admission   As evidenced by: Patient will produce a CIWA/COWS score of 0, have stable vitals signs, and no symptoms of withdrawal  07/06/15: Pt is completing Librium detox protocol. Denies symptoms of withdrawal.  Attendees:  Patient:    Family:    Physician: Dr. Sabra Heck, MD; Dr. Shea Evans, MD 07/06/2015 10:04 AM  Nursing: Lars Pinks, RN Case manager  07/06/2015 10:04 AM  Clinical Social Worker Peri Maris, [place] 07/06/2015 10:04 AM  Other: Tilden Fossa, [place] 07/06/2015 10:04 AM  Clinical: Marcella Dubs, RN 07/06/2015 10:04 AM  Other: , RN Charge Nurse 07/06/2015 10:04 AM  Other: Hilda Lias, [place], [place] Social Work [phone]

## 2015-07-06 NOTE — Progress Notes (Addendum)
Patient ID: Carolyn Ayala, female   DOB: 03-Jun-1976, 40 y.o.   MRN: [id number]   Pt currently presents with an animated affect and cooperative behavior. Per self inventory, pt rates depression at a 10, hopelessness 9 and anxiety 10. [name] daily goal is to "my medicines" and they intend to do so by "i don't know." Pt reports poor sleep, a good appetite, low energy and poor concentration. Pt visibly sweating, red nose, sniffling, pt reports "I don't feel well." Pt not febrile.   Pt provided with medications per providers orders. [name] labs and vitals were monitored throughout the day. Pt supported emotionally and encouraged to express concerns and questions. Pt educated on medications. Hypoglycemic protocol carried out today, provider notified. Pt currently on droplet precautions per Infection Control. Respiratory Virus Panel sent to [facility] lab.    [name] safety ensured with 15 minute and environmental checks. Pt currently endorses some passive SI. Pt currently denies HI and A/V hallucinations. Pt verbally agrees to seek staff if HI or A/VH occurs and to consult with staff before acting on any harmful thoughts. Pt currently stable. Will continue POC.

## 2015-07-07 LAB — GLUCOSE, CAPILLARY
Glucose-Capillary: 127 mg/dL — ABNORMAL HIGH (ref 65–99)
Glucose-Capillary: 200 mg/dL — ABNORMAL HIGH (ref 65–99)
Glucose-Capillary: 146 mg/dL — ABNORMAL HIGH (ref 65–99)

## 2015-07-07 MED ORDER — ARIPIPRAZOLE 15 MG PO TABS
15.0000 mg | ORAL_TABLET | Freq: Every day | ORAL | Status: DC
  Administered 2015-07-08 – 2015-07-09 (×2): 15 mg via ORAL
  Filled 2015-07-07 (×3): qty 1
  Filled 2015-07-07: qty 14

## 2015-07-07 NOTE — Progress Notes (Signed)
Patient ID: Carolyn Ayala, female   DOB: 09-Feb-1976, 40 y.o.   MRN: [id number]  Pt currently presents with an animated affect and anxious behavior. Per self inventory, pt rates depression at a 10 and anxiety at a 10. [name] daily goal is "just to find out my results from my labwork" and they intend to do so by "don't know." Pt reports fair sleep, a fair appetite, low energy and poor concentration. Holding hands with another pt and had to be redirected, pt redirectable.   Pt provided with medications per providers orders. [name] labs and vitals were monitored throughout the day. Pt supported emotionally and encouraged to express concerns and questions. Pt educated on medications. Pt remains on droplet precautions, awaiting lab results. Pt educated on precautions.   [name] safety ensured with 15 minute and environmental checks. Pt currently denies SI/HI and A/V hallucinations. Pt verbally agrees to seek staff if SI/HI or A/VH occurs and to consult with staff before acting on these thoughts. Pt reports that if her situation isn't okay, that would keep her from following her discharge plans. Will continue POC.

## 2015-07-07 NOTE — Progress Notes (Signed)
Adult Psychoeducational Group Note  Date:  07/07/2015 Time:  09:00am  Group Topic/Focus:  Recovery Goals:   The focus of this group is to identify appropriate goals for recovery and establish a plan to achieve them.  Participation Level:  Active  Participation Quality:  Appropriate  Affect:  Flat  Cognitive:  Appropriate at baseline  Insight: Lacking  Engagement in Group:  Improving  Modes of Intervention:  Discussion, Education, Orientation and Support  Additional Comments:  Pt able to identify one recovery goal to accomplish post discharge, use coping skills to handle alcohol abuse cravings.   Aurora Mask 07/07/2015, 10:05 AM

## 2015-07-07 NOTE — [facility] Group Notes (Signed)
[facility] LCSW Group Therapy 07/07/2015 1:15 PM  Type of Therapy: Group Therapy- Feelings about Diagnosis  Participation Level: Reserved  Participation Quality:  Appropriate  Affect:  Appropriate  Cognitive: Alert and Oriented   Insight:  Developing   Engagement in Therapy: Developing/Improving and Engaged   Modes of Intervention: Clarification, Confrontation, Discussion, Education, Exploration, Limit-setting, Orientation, Problem-solving, Rapport Building, Dance movement psychotherapist, Socialization and Support  Description of Group:   This group will allow patients to explore their thoughts and feelings about diagnoses they have received. Patients will be guided to explore their level of understanding and acceptance of these diagnoses. Facilitator will encourage patients to process their thoughts and feelings about the reactions of others to their diagnosis, and will guide patients in identifying ways to discuss their diagnosis with significant others in their lives. This group will be process-oriented, with patients participating in exploration of their own experiences as well as giving and receiving support and challenge from other group members.  Summary of Progress/Problems:  Pt expressed that she feels that mental and physical well-being are dependent on one another. Pt expressed that she would like to be involved with AA as she would like to be able to support others who suffer from addiction and mental illness.  Therapeutic Modalities:   Cognitive Behavioral Therapy Solution Focused Therapy Motivational Interviewing Relapse Prevention Therapy  Chad Cordial, LCSWA 07/07/2015 4:55 PM

## 2015-07-07 NOTE — Progress Notes (Signed)
Pt attended spiritual care group on grief and loss facilitated by chaplain Yu Cragun   Group opened with brief discussion and psycho-social ed around grief and loss in relationships and in relation to self - identifying life patterns, circumstances, changes that cause losses. Established group norm of speaking from own life experience. Group goal of establishing open and affirming space for members to share loss and experience with grief, normalize grief experience and provide psycho social education and grief support.     

## 2015-07-07 NOTE — Progress Notes (Signed)
Adult Psychoeducational Group Note  Date:  07/07/2015 Time:  9:05 PM  Group Topic/Focus:  Wrap-Up Group:   The focus of this group is to help patients review their daily goal of treatment and discuss progress on daily workbooks.  Participation Level:  Active  Participation Quality:  Appropriate  Affect:  Appropriate  Cognitive:  Alert  Insight: Appropriate  Engagement in Group:  Engaged  Modes of Intervention:  Discussion  Additional Comments:  Patient stated having a good day. Patient stated she got in touch with [facility] and will hopefully be able to discharge on Thursday. Patient stated something positive that happened today "was being able to get on different and better medication".   Jeanet Lupe L Rodrigus Kilker 07/07/2015, 9:05 PM

## 2015-07-07 NOTE — Progress Notes (Signed)
D: Pt continues to present with a depressed and anxious mood. Pt was not compliant with keeping her mask on in the dayroom and hallways. Pt required constant reminder. Pt denied any SI/HI/AVH. Pt was instructed to inform staff of any loose stools. No loose stools reported. Pt continues to complain of left lower abdominal pain. relief provided with motrin. A: Writer administered scheduled and prn medications to pt, per MD orders. Continued support and availability as needed was extended to this pt. Staff continues to monitor pt with q68min checks.  R: No adverse drug reactions noted. Pt receptive to treatment. Pt remains safe at this time.

## 2015-07-07 NOTE — Progress Notes (Addendum)
Patient ID: Carolyn Ayala, female   DOB: 1975-07-24, 40 y.o.   MRN: [id number] [facility] MD Progress Note  07/07/2015 5:11 PM Carolyn Ayala  MRN:  [id number] Subjective:    Patient reports feeling partially better than on admission, but reports ongoing sense of anxiety, depression. Tends to be somatically focused . Had reported L flank and LLQ abdominal discomfort, and also describes symptoms of " a cold" primarily watery eyes, some congestion.  Denies medication side effects.  Objective:  I have discussed case with treatment team  And have met with patient. Patient improved compared to admission- less depressed, presents with fuller range of affect. Does report some ongoing depression and anxiety. Denies suicidal ideations at present . As above, tends to be somatically preoccupied, and anxious about her physical health. Much relieved when we reviewed her labs, and improved/now normal lipase level . Patient concerned about possibly having the flu, and is wearing facemask as precaution.  Of note, patient in no acute distress, and presents calm, comfortable States hallucinations, which she had reported on admission, have decreased but not completely subsided- she does not appear internally preoccupied or paranoid at this time.  Denies cravings for alcohol at this time . Not currently presenting with tremors or diaphoresis, and vitals are stable .( No fever) Principal Problem: Alcoholism with alcohol dependence ([place]) Diagnosis:   Patient Active Problem List   Diagnosis Date Noted  . Major depressive disorder, recurrent episode, severe, with psychotic behavior ([place]) [F33.3] 07/03/2015  . Bipolar disorder, curr episode mixed, severe, with psychotic features ([place]) [F31.64] 05/01/2015  . Uncontrolled diabetes mellitus with neurologic complication, with long-term current use of insulin ([facility]) [E11.49, Z79.4, E11.65]   . Uncontrolled diabetes mellitus with diabetic neuropathy, with long-term  current use of insulin ([place]) [E11.40, Z79.4, E11.65] 04/21/2015  . PTSD (post-traumatic stress disorder) [F43.10] 04/20/2015  . Alcohol dependence with withdrawal, uncomplicated ([place]) [S28.768] 04/20/2015  . Suicidal ideation [R45.851] 04/19/2015  . Recent bereavement [Z78.9] 04/19/2015  . Generalized anxiety disorder [F41.1] 02/13/2013  . Chronic posttraumatic stress disorder [F43.12] 02/13/2013  . Alcoholism with alcohol dependence ([place]) [F10.20] 02/08/2013  . Substance induced mood disorder ([place]) [F19.94] 02/08/2013  . IDDM (insulin dependent diabetes mellitus) ([place]) [E11.9, Z79.4] 02/08/2013   Total Time spent with patient: 20 minutes    Past Medical History:  Past Medical History  Diagnosis Date  . Mental disorder   . Depression   . Diabetes mellitus without complication ([place])   . Hypertension     Past Surgical History  Procedure Laterality Date  . Cesarean section     Family History:  Family History  Problem Relation Age of Onset  . Depression Mother     Social History:  History  Alcohol Use  . Yes    Comment: daily     History  Drug Use No    Social History   Social History  . Marital Status: Single    Spouse Name: N/A  . Number of Children: N/A  . Years of Education: N/A   Social History Main Topics  . Smoking status: Never Smoker   . Smokeless tobacco: Current User  . Alcohol Use: Yes     Comment: daily  . Drug Use: No  . Sexual Activity: No   Other Topics Concern  . None   Social History Narrative   ** Merged History Encounter **       Additional Social History:   Sleep: improved  Appetite:  Good  Current Medications: Current  Facility-Administered Medications  Medication Dose Route Frequency Provider Last Rate Last Dose  . acamprosate (CAMPRAL) tablet 666 mg  666 mg Oral TID WC Myer Peer Cobos, MD   666 mg at 07/05/15 1200  . acetaminophen (TYLENOL) tablet 650 mg  650 mg Oral Q6H PRN Harriet Butte, NP   650 mg at 07/05/15 0947   . alum & mag hydroxide-simeth (MAALOX/MYLANTA) 200-200-20 MG/5ML suspension 30 mL  30 mL Oral Q4H PRN Harriet Butte, NP      . Derrill Memo ON 07/08/2015] ARIPiprazole (ABILIFY) tablet 15 mg  15 mg Oral Daily Fernando A Cobos, MD      . capsaicin (ZOSTRIX) 0.025 % cream   Topical BID Harriet Butte, NP      . cyclobenzaprine (FLEXERIL) tablet 5 mg  5 mg Oral BID Derrill Center, NP   5 mg at 07/07/15 0802  . ferrous sulfate tablet 325 mg  325 mg Oral Q breakfast Harriet Butte, NP   325 mg at 07/07/15 0801  . FLUoxetine (PROZAC) capsule 20 mg  20 mg Oral Daily Harriet Butte, NP   20 mg at 07/07/15 0801  . gabapentin (NEURONTIN) capsule 900 mg  900 mg Oral TID PC & HS Harriet Butte, NP   900 mg at 07/07/15 1335  . guaiFENesin (MUCINEX) 12 hr tablet 600 mg  600 mg Oral BID PRN Jenne Campus, MD   600 mg at 07/07/15 0929  . hydrOXYzine (ATARAX/VISTARIL) tablet 50 mg  50 mg Oral BID PRN Harriet Butte, NP   50 mg at 07/07/15 0801  . ibuprofen (ADVIL,MOTRIN) tablet 600 mg  600 mg Oral Q6H PRN Harriet Butte, NP   600 mg at 07/05/15 2151  . insulin aspart (novoLOG) injection 15 Units  15 Units Subcutaneous TID WC Harriet Butte, NP   15 Units at 07/06/15 [phone]  . insulin glargine (LANTUS) injection 42 Units  42 Units Subcutaneous BID Harriet Butte, NP   42 Units at 07/07/15 7253  . lisinopril (PRINIVIL,ZESTRIL) tablet 5 mg  5 mg Oral Daily Harriet Butte, NP   5 mg at 07/07/15 0802  . loratadine (CLARITIN) tablet 10 mg  10 mg Oral q morning - 10a Harriet Butte, NP   10 mg at 07/07/15 0929  . magnesium hydroxide (MILK OF MAGNESIA) suspension 30 mL  30 mL Oral Daily PRN Harriet Butte, NP      . menthol-cetylpyridinium (CEPACOL) lozenge 3 mg  1 lozenge Oral PRN Kerrie Buffalo, NP   3 mg at 07/06/15 2319  . metFORMIN (GLUCOPHAGE) tablet 1,000 mg  1,000 mg Oral BID WC Harriet Butte, NP   1,000 mg at 07/07/15 0801  . mirtazapine (REMERON) tablet 15 mg  15 mg Oral QHS Jenne Campus, MD   15 mg  at 07/06/15 2311  . multivitamin with minerals tablet 1 tablet  1 tablet Oral Daily Jenne Campus, MD   1 tablet at 07/07/15 0802  . thiamine (VITAMIN B-1) tablet 100 mg  100 mg Oral Daily Jenne Campus, MD   100 mg at 07/07/15 6644    Lab Results:  Results for orders placed or performed during the hospital encounter of 07/03/15 (from the past 48 hour(s))  Lipase, blood     Status: None   Collection Time: 07/05/15  6:31 PM  Result Value Ref Range   Lipase 47 11 - 51 U/L    Comment: Performed at [facility]  CBC with Differential/Platelet     Status: Abnormal   Collection Time: 07/05/15  6:31 PM  Result Value Ref Range   WBC 7.0 4.0 - 10.5 K/uL   RBC 4.84 3.87 - 5.11 MIL/uL   Hemoglobin 12.3 12.0 - 15.0 g/dL   HCT 37.3 36.0 - 46.0 %   MCV 77.1 (L) 78.0 - 100.0 fL   MCH 25.4 (L) 26.0 - 34.0 pg   MCHC 33.0 30.0 - 36.0 g/dL   RDW 16.2 (H) 11.5 - 15.5 %   Platelets 273 150 - 400 K/uL   Neutrophils Relative % 42 %   Neutro Abs 2.9 1.7 - 7.7 K/uL   Lymphocytes Relative 48 %   Lymphs Abs 3.4 0.7 - 4.0 K/uL   Monocytes Relative 7 %   Monocytes Absolute 0.5 0.1 - 1.0 K/uL   Eosinophils Relative 3 %   Eosinophils Absolute 0.2 0.0 - 0.7 K/uL   Basophils Relative 0 %   Basophils Absolute 0.0 0.0 - 0.1 K/uL    Comment: Performed at [facility]  Glucose, capillary     Status: Abnormal   Collection Time: 07/05/15  8:28 PM  Result Value Ref Range   Glucose-Capillary 253 (H) 65 - 99 mg/dL   Comment 1 Notify RN   Glucose, capillary     Status: Abnormal   Collection Time: 07/06/15  6:31 AM  Result Value Ref Range   Glucose-Capillary 349 (H) 65 - 99 mg/dL  Glucose, capillary     Status: None   Collection Time: 07/06/15 11:50 AM  Result Value Ref Range   Glucose-Capillary 68 65 - 99 mg/dL  Glucose, capillary     Status: None   Collection Time: 07/06/15 12:06 PM  Result Value Ref Range   Glucose-Capillary 72 65 - 99 mg/dL  Glucose, capillary      Status: Abnormal   Collection Time: 07/06/15  4:44 PM  Result Value Ref Range   Glucose-Capillary 272 (H) 65 - 99 mg/dL  Glucose, capillary     Status: Abnormal   Collection Time: 07/06/15  6:34 PM  Result Value Ref Range   Glucose-Capillary 239 (H) 65 - 99 mg/dL   Comment 1 Notify RN    Comment 2 Document in Chart   Glucose, capillary     Status: Abnormal   Collection Time: 07/06/15  9:18 PM  Result Value Ref Range   Glucose-Capillary 133 (H) 65 - 99 mg/dL  Glucose, capillary     Status: Abnormal   Collection Time: 07/07/15  6:11 AM  Result Value Ref Range   Glucose-Capillary 127 (H) 65 - 99 mg/dL  Glucose, capillary     Status: Abnormal   Collection Time: 07/07/15 11:46 AM  Result Value Ref Range   Glucose-Capillary 200 (H) 65 - 99 mg/dL    Physical Findings: AIMS:  , ,  ,  ,    CIWA:  CIWA-Ar Total: 6 COWS:     Musculoskeletal: Strength & Muscle Tone: within normal limits at this time no noticeable tremors or diaphoresis, no restlessness  Gait & Station: normal Patient leans: N/A  Psychiatric Specialty Exam: Review of Systems  Respiratory: Positive for cough.   Gastrointestinal: Positive for nausea and abdominal pain.       Lower abdominal pain, diarrhea.   Musculoskeletal: Positive for myalgias.  Neurological: Positive for headaches.  Psychiatric/Behavioral: Positive for depression and substance abuse. Negative for suicidal ideas. The patient is nervous/anxious and has insomnia.   All other  systems reviewed and are negative.  denies headache, denies shortness of breath, no chest pain, describes vague lower left quadrant discomfort, denies constipation or melenas , no vomiting   Blood pressure 120/70, pulse 98, temperature 97.4 F (36.3 C), temperature source Oral, resp. rate 20, height _0  (1.549 m), weight 197 lb (89.359 kg), last menstrual period 06/12/2015.Body mass index is 37.24 kg/(m^2).  General Appearance: Fairly Groomed  Engineer, water::  Good  Speech:   Normal Rate  Volume:  Normal  Mood:  Has improved compared to admission, still anxious  Affect:  Mildly constricted but more reactive, anxious   Thought Process:  Linear  Orientation:  Other:  fully alert and attentive   Thought Content:   denies auditory hallucinations at this time.  No delusions  Expressed at this time   Suicidal Thoughts:  No- at this time denies any suicidal ideations or any self injurious ideations   Homicidal Thoughts:  No  Memory:  recent and remote grossly intact   Judgement:  Fair  Insight:  Present  Psychomotor Activity:  Normal- no tremors, no restlessness   Concentration:  Good  Recall:  Good  Fund of Knowledge:Good  Language: Good  Akathisia:  Negative  Handed:  Right  AIMS (if indicated):     Assets:  Desire for Improvement Resilience  ADL's: improved   Cognition: WNL  Sleep:  Number of Hours: 5.5   Assessment - patient presents with partial improvement compared to admission- mood is improved, and currently is not presenting with symptoms of alcohol WDL . She does remain anxious, seeking reassurance and support often, and somatically focused . Does not appear to be in any acute distress, does not appear acutely ill, appetite good, and no fever.  Treatment Plan Summary: Daily contact with patient to assess and evaluate symptoms and progress in treatment, Medication management, Plan inpatient admission and medications as below  Continue to encourage milieu, group participation to work on coping skills and symptom reduction Continue to encourage and support early recovery and relapse prevention efforts Continue Prozac 20 mgrs QDAY for depression and anxiety Increase  Abilify to 15  mgrs QDAY for mood disorder/ depression/residual psychotic symptoms  Continue Neurontin 900 mgrs TID for anxiety, pain Continue Librium detox protocol to minimize risk of alcohol WDL symptoms Continue GLucophage and Lantus Insulin for management of DM. Respiratory viral  panel ordered, results pending   COBOS, FERNANDO- MD  07/07/2015, 5:11 PM

## 2015-07-08 LAB — RESPIRATORY VIRUS PANEL
Adenovirus: NEGATIVE
Influenza A: NEGATIVE
Influenza B: NEGATIVE
Metapneumovirus: NEGATIVE
Parainfluenza 1: NEGATIVE
Parainfluenza 2: NEGATIVE
Parainfluenza 3: NEGATIVE
Respiratory Syncytial Virus A: NEGATIVE
Respiratory Syncytial Virus B: NEGATIVE
Rhinovirus: NEGATIVE

## 2015-07-08 LAB — GLUCOSE, CAPILLARY
Glucose-Capillary: 190 mg/dL — ABNORMAL HIGH (ref 65–99)
Glucose-Capillary: 178 mg/dL — ABNORMAL HIGH (ref 65–99)
Glucose-Capillary: 312 mg/dL — ABNORMAL HIGH (ref 65–99)
Glucose-Capillary: 216 mg/dL — ABNORMAL HIGH (ref 65–99)

## 2015-07-08 NOTE — [facility] Group Notes (Signed)
[facility] LCSW Group Therapy 07/08/2015 1:15 PM  Type of Therapy: Group Therapy- Emotion Regulation  Participation Level: Minimal  Participation Quality:  Reserved  Affect: Appropriate  Cognitive: Alert and Oriented   Insight:  Developing/Improving  Engagement in Therapy: Developing/Improving and Engaged   Modes of Intervention: Clarification, Confrontation, Discussion, Education, Exploration, Limit-setting, Orientation, Problem-solving, Rapport Building, Dance movement psychotherapist, Socialization and Support  Summary of Progress/Problems: The topic for group today was emotional regulation. This group focused on both positive and negative emotion identification and allowed group members to process ways to identify feelings, regulate negative emotions, and find healthy ways to manage internal/external emotions. Group members were asked to reflect on a time when their reaction to an emotion led to a negative outcome and explored how alternative responses using emotion regulation would have benefited them. Group members were also asked to discuss a time when emotion regulation was utilized when a negative emotion was experienced. Pt did not participate in group discussion.   Chad Cordial, LCSWA 07/08/2015 3:51 PM

## 2015-07-08 NOTE — Progress Notes (Addendum)
Patient ID: Carolyn Ayala, female   DOB: 1976-01-27, 40 y.o.   MRN: [id number] D -----    Pt. Removed from dropplet precautions after lab results came back " negative ".    NP notified

## 2015-07-08 NOTE — Progress Notes (Signed)
D. Pt had been up and visible in milieu this evening, pt did speak about how she still feels depressed and anxious. Pt also reports a cough and feeling congested. Pt did report that she was sleeping ok and did receive bedtime medications but chose not to take remeron until 12 midnight as she stated that she needed to read her bible before taking. However when 12 midnight came pt was in bed asleep and did not receive remeron this evening. A.Support provided. R. Safety maintained, will continue to monitor.

## 2015-07-08 NOTE — Progress Notes (Signed)
Patient ID: Carolyn Ayala, female   DOB: 1976/06/13, 40 y.o.   MRN: [id number] D   ---  pt. agrees to contract for safety at this time.    She has decline to take her scheduled Lantus this morning and said she would as for it later this morning.   Pt. Is friendly and  Pleasant with Clinical research associate but reports being highly stressed over her home situation.    Pt. Said her boyfriend has " already Taken up with some Bitch already,  I have only be in the hospital for 4 days. ".   Pt. Also said clearly "  I am god to kill both of them after I get out of here . I am going to get some battery acid and pour it onto that bastards face  And if that Bitch sayes anything , I will give her the same ".    She then said   " after I'm do that , I am going to kill them both".    Pt. Shows no sign of hostility or aggression toward staff.   . ---   A  ---  Monitor pt. Closely and be available  To talk  At all times.  ---  R ---  Pt. Remains safe on unit

## 2015-07-08 NOTE — Progress Notes (Signed)
Patient ID: Carolyn Ayala, female   DOB: October 01, 1975, 40 y.o.   MRN: [id number] [facility] MD Progress Note  07/08/2015 4:10 PM Carolyn Ayala  MRN:  [id number] Subjective:    Patient reports some improvement, but continues to feel anxious. Today ruminative about boyfriend " having someone else there with him".   Objective:  I have discussed case with treatment team  And have met with patient. Compared to admission patient has improved- she is less depressed, she denies suicidal ideations. She does continue to present with some anxiety. Although still somatically focused, she is less so today,and expressed relief about influenza panel being negative . Of note, patient does not endorse fever, malaise, coughing, shortness of breath,  or congestion.  As reviewed in chart notes, patient had expressed violent ideations towards BF and a woman earlier today - we reviewed this at length. Patient states she had been dating this man for about a month, states that she called him to find out if he still had her coat and that phone was answered by a woman, which made her feel hurt, angry . States " he knows I am in the hospital and he already got someone else ". Stated she had fantasy of throwing battery acid on him and her, but denies actual plan or intention of violence, stating  She ws simply venting . " I  don't even love the guy really, all I wanted was my coat back". States " I don't think I am even going to go there when I leave. If I do it will be with police so I can make sure I get my coat back".  Continues to express desire to go to a Rehab program after discharge .Marland Kitchen  No WDL symptoms at this time . Principal Problem: Alcoholism with alcohol dependence ([place]) Diagnosis:   Patient Active Problem List   Diagnosis Date Noted  . Major depressive disorder, recurrent episode, severe, with psychotic behavior ([place]) [F33.3] 07/03/2015  . Bipolar disorder, curr episode mixed, severe, with psychotic  features ([place]) [F31.64] 05/01/2015  . Uncontrolled diabetes mellitus with neurologic complication, with long-term current use of insulin (HCC) [E11.49, Z79.4, E11.65]   . Uncontrolled diabetes mellitus with diabetic neuropathy, with long-term current use of insulin ([place]) [E11.40, Z79.4, E11.65] 04/21/2015  . PTSD (post-traumatic stress disorder) [F43.10] 04/20/2015  . Alcohol dependence with withdrawal, uncomplicated ([place]) [O67.672] 04/20/2015  . Suicidal ideation [R45.851] 04/19/2015  . Recent bereavement [Z78.9] 04/19/2015  . Generalized anxiety disorder [F41.1] 02/13/2013  . Chronic posttraumatic stress disorder [F43.12] 02/13/2013  . Alcoholism with alcohol dependence ([place]) [F10.20] 02/08/2013  . Substance induced mood disorder ([place]) [F19.94] 02/08/2013  . IDDM (insulin dependent diabetes mellitus) ([place]) [E11.9, Z79.4] 02/08/2013   Total Time spent with patient: 20 minutes    Past Medical History:  Past Medical History  Diagnosis Date  . Mental disorder   . Depression   . Diabetes mellitus without complication ([place])   . Hypertension     Past Surgical History  Procedure Laterality Date  . Cesarean section     Family History:  Family History  Problem Relation Age of Onset  . Depression Mother     Social History:  History  Alcohol Use  . Yes    Comment: daily     History  Drug Use No    Social History   Social History  . Marital Status: Single    Spouse Name: N/A  . Number of Children: N/A  . Years of Education:  N/A   Social History Main Topics  . Smoking status: Never Smoker   . Smokeless tobacco: Current User  . Alcohol Use: Yes     Comment: daily  . Drug Use: No  . Sexual Activity: No   Other Topics Concern  . None   Social History Narrative   ** Merged History Encounter **       Additional Social History:   Sleep: improved  Appetite:  Good  Current Medications: Current Facility-Administered Medications  Medication Dose Route Frequency  Provider Last Rate Last Dose  . acamprosate (CAMPRAL) tablet 666 mg  666 mg Oral TID WC Jenne Campus, MD   666 mg at 07/08/15 1136  . acetaminophen (TYLENOL) tablet 650 mg  650 mg Oral Q6H PRN Harriet Butte, NP   650 mg at 07/05/15 0947  . alum & mag hydroxide-simeth (MAALOX/MYLANTA) 200-200-20 MG/5ML suspension 30 mL  30 mL Oral Q4H PRN Harriet Butte, NP      . ARIPiprazole (ABILIFY) tablet 15 mg  15 mg Oral Daily Jenne Campus, MD   15 mg at 07/08/15 0804  . capsaicin (ZOSTRIX) 0.025 % cream   Topical BID Harriet Butte, NP      . ferrous sulfate tablet 325 mg  325 mg Oral Q breakfast Harriet Butte, NP   325 mg at 07/08/15 0804  . FLUoxetine (PROZAC) capsule 20 mg  20 mg Oral Daily Harriet Butte, NP   20 mg at 07/08/15 0800  . gabapentin (NEURONTIN) capsule 900 mg  900 mg Oral TID PC & HS Harriet Butte, NP   900 mg at 07/08/15 1326  . guaiFENesin (MUCINEX) 12 hr tablet 600 mg  600 mg Oral BID PRN Jenne Campus, MD   600 mg at 07/07/15 2101  . hydrOXYzine (ATARAX/VISTARIL) tablet 50 mg  50 mg Oral BID PRN Harriet Butte, NP   50 mg at 07/08/15 1044  . ibuprofen (ADVIL,MOTRIN) tablet 600 mg  600 mg Oral Q6H PRN Harriet Butte, NP   600 mg at 07/07/15 1742  . insulin aspart (novoLOG) injection 15 Units  15 Units Subcutaneous TID WC Harriet Butte, NP   15 Units at 07/08/15 1214  . insulin glargine (LANTUS) injection 42 Units  42 Units Subcutaneous BID Harriet Butte, NP   42 Units at 07/08/15 1036  . lisinopril (PRINIVIL,ZESTRIL) tablet 5 mg  5 mg Oral Daily Harriet Butte, NP   5 mg at 07/08/15 0803  . loratadine (CLARITIN) tablet 10 mg  10 mg Oral q morning - 10a Harriet Butte, NP   10 mg at 07/08/15 0947  . magnesium hydroxide (MILK OF MAGNESIA) suspension 30 mL  30 mL Oral Daily PRN Harriet Butte, NP      . menthol-cetylpyridinium (CEPACOL) lozenge 3 mg  1 lozenge Oral PRN Kerrie Buffalo, NP   3 mg at 07/06/15 2319  . metFORMIN (GLUCOPHAGE) tablet 1,000 mg  1,000 mg  Oral BID WC Harriet Butte, NP   1,000 mg at 07/08/15 0805  . mirtazapine (REMERON) tablet 15 mg  15 mg Oral QHS Jenne Campus, MD   15 mg at 07/06/15 2311  . multivitamin with minerals tablet 1 tablet  1 tablet Oral Daily Jenne Campus, MD   1 tablet at 07/08/15 0805  . thiamine (VITAMIN B-1) tablet 100 mg  100 mg Oral Daily Jenne Campus, MD   100 mg at 07/08/15 [phone]  Lab Results:  Results for orders placed or performed during the hospital encounter of 07/03/15 (from the past 48 hour(s))  Glucose, capillary     Status: Abnormal   Collection Time: 07/06/15  4:44 PM  Result Value Ref Range   Glucose-Capillary 272 (H) 65 - 99 mg/dL  Glucose, capillary     Status: Abnormal   Collection Time: 07/06/15  6:34 PM  Result Value Ref Range   Glucose-Capillary 239 (H) 65 - 99 mg/dL   Comment 1 Notify RN    Comment 2 Document in Chart   Glucose, capillary     Status: Abnormal   Collection Time: 07/06/15  9:18 PM  Result Value Ref Range   Glucose-Capillary 133 (H) 65 - 99 mg/dL  Glucose, capillary     Status: Abnormal   Collection Time: 07/07/15  6:11 AM  Result Value Ref Range   Glucose-Capillary 127 (H) 65 - 99 mg/dL  Glucose, capillary     Status: Abnormal   Collection Time: 07/07/15 11:46 AM  Result Value Ref Range   Glucose-Capillary 200 (H) 65 - 99 mg/dL  Glucose, capillary     Status: Abnormal   Collection Time: 07/07/15  4:59 PM  Result Value Ref Range   Glucose-Capillary 190 (H) 65 - 99 mg/dL   Comment 1 Notify RN    Comment 2 Document in Chart   Glucose, capillary     Status: Abnormal   Collection Time: 07/07/15  8:37 PM  Result Value Ref Range   Glucose-Capillary 146 (H) 65 - 99 mg/dL  Glucose, capillary     Status: Abnormal   Collection Time: 07/08/15  7:54 AM  Result Value Ref Range   Glucose-Capillary 178 (H) 65 - 99 mg/dL  Glucose, capillary     Status: Abnormal   Collection Time: 07/08/15 11:57 AM  Result Value Ref Range   Glucose-Capillary 312 (H) 65 -  99 mg/dL    Physical Findings: AIMS: Facial and Oral Movements Muscles of Facial Expression: None, normal Lips and Perioral Area: None, normal Jaw: None, normal Tongue: None, normal,Extremity Movements Upper (arms, wrists, hands, fingers): None, normal Lower (legs, knees, ankles, toes): None, normal, Trunk Movements Neck, shoulders, hips: None, normal, Overall Severity Severity of abnormal movements (highest score from questions above): None, normal Incapacitation due to abnormal movements: None, normal Patient's awareness of abnormal movements (rate only patient's report): No Awareness,    CIWA:  CIWA-Ar Total: 3 COWS:     Musculoskeletal: Strength & Muscle Tone: within normal limits at this time no noticeable tremors or diaphoresis, no restlessness  Gait & Station: normal Patient leans: N/A  Psychiatric Specialty Exam: Review of Systems  Respiratory: Positive for cough.   Gastrointestinal: Positive for nausea and abdominal pain.       Lower abdominal pain, diarrhea.   Musculoskeletal: Positive for myalgias.  Neurological: Positive for headaches.  Psychiatric/Behavioral: Positive for depression and substance abuse. Negative for suicidal ideas. The patient is nervous/anxious and has insomnia.   All other systems reviewed and are negative.  denies headache, denies shortness of breath, no chest pain, describes vague lower left quadrant discomfort, denies constipation or melenas , no vomiting   Blood pressure 115/82, pulse 101, temperature 98 F (36.7 C), temperature source Oral, resp. rate 20, height 5' 1"  (1.549 m), weight 197 lb (89.359 kg), last menstrual period 06/12/2015.Body mass index is 37.24 kg/(m^2).  General Appearance: Fairly Groomed  Engineer, water::  Good  Speech:  Normal Rate  Volume:  Normal  Mood:  Improving   Affect:  Mildly constricted but more reactive, anxious   Thought Process:  Linear  Orientation:  Other:  fully alert and attentive   Thought Content:    denies auditory hallucinations at this time.  No delusions  Expressed at this time   Suicidal Thoughts:  No- at this time denies any suicidal ideations or any self injurious ideations   Homicidal Thoughts:  Earlier today expressed violent ideations towards BF and woman but later stated this was venting rather than any actual plan or intent- during this session denied any actual plan or intention of violence towards anyone   Memory:  recent and remote grossly intact   Judgement:  Fair  Insight:  Present  Psychomotor Activity:  Normal- no tremors, no restlessness   Concentration:  Good  Recall:  Good  Fund of Knowledge:Good  Language: Good  Akathisia:  Negative  Handed:  Right  AIMS (if indicated):     Assets:  Desire for Improvement Resilience  ADL's: improved   Cognition: WNL  Sleep:  Number of Hours: 5   Assessment - patient gradually improving, less depressed, less anxious , today ruminative about finding out that her BF of one month had another woman staying with him- initially reported violent ideations, later stating she was just venting and denying any actual plan or intention of violence. Her behavior on unit has remained in good control. Tolerating medications well- motivated in going to a Rehab after discharge.   Treatment Plan Summary: Daily contact with patient to assess and evaluate symptoms and progress in treatment, Medication management, Plan inpatient admission and medications as below  Continue to encourage milieu, group participation to work on coping skills and symptom reduction Continue to encourage and support early recovery and relapse prevention efforts Continue Prozac 20 mgrs QDAY for depression and anxiety Continue  Abilify 15  mgrs QDAY for mood disorder/ depression/residual psychotic symptoms  Continue Neurontin 900 mgrs TID for anxiety, pain Continue GLucophage and Lantus Insulin for management of DM. Treatment team working on disposition options   Neita Garnet- MD  07/08/2015, 4:10 PM

## 2015-07-08 NOTE — BHH Group Notes (Signed)
[facility] LCSW Aftercare Discharge Planning Group Note  07/08/2015 8:45 AM  Pt did not attend, declined invitation.   Chad Cordial, LCSWA 07/08/2015 9:39 AM

## 2015-07-08 NOTE — Progress Notes (Signed)
Patient ID: Carolyn Ayala, female   DOB: August 17, 1975, 40 y.o.   MRN: [id number] D: Client visible on the unit interacting with peers and on the phone. Client reports depression "10" of 10. " I was wanting them to give me a bus ticket to [place], need to get from around here" "my mom died a month ago" Client somatic, complains of a cough, "feel like pins/needles sticking me, in my toes too" A: Clinical research associate provided emotional support, educated client on symptoms associated with diabetes "neuropathy" encouraged client to monitor her diet, try to eliminate/decrease sugar, carbohydrates. Staff will monitor q45min for safety. R: client is safe on the unit, attended group.

## 2015-07-09 DIAGNOSIS — F1024 Alcohol dependence with alcohol-induced mood disorder: Secondary | ICD-10-CM | POA: Insufficient documentation

## 2015-07-09 LAB — GLUCOSE, CAPILLARY
Glucose-Capillary: 368 mg/dL — ABNORMAL HIGH (ref 65–99)
Glucose-Capillary: 185 mg/dL — ABNORMAL HIGH (ref 65–99)
Glucose-Capillary: 373 mg/dL — ABNORMAL HIGH (ref 65–99)
Glucose-Capillary: 316 mg/dL — ABNORMAL HIGH (ref 65–99)
Glucose-Capillary: 155 mg/dL — ABNORMAL HIGH (ref 65–99)

## 2015-07-09 MED ORDER — LOPERAMIDE HCL 2 MG PO CAPS
4.0000 mg | ORAL_CAPSULE | ORAL | Status: DC | PRN
  Administered 2015-07-09: 4 mg via ORAL
  Filled 2015-07-09: qty 2

## 2015-07-09 MED ORDER — ACAMPROSATE CALCIUM 333 MG PO TBEC: 666.0000 mg | DELAYED_RELEASE_TABLET | Freq: Three times a day (TID) | ORAL | Status: DC

## 2015-07-09 MED ORDER — ARIPIPRAZOLE 15 MG PO TABS: 15.0000 mg | ORAL_TABLET | Freq: Every day | ORAL | Status: DC

## 2015-07-09 MED ORDER — GABAPENTIN 300 MG PO CAPS: 900.0000 mg | ORAL_CAPSULE | Freq: Three times a day (TID) | ORAL | Status: DC

## 2015-07-09 MED ORDER — FLUOXETINE HCL 20 MG PO CAPS: 20.0000 mg | ORAL_CAPSULE | Freq: Every day | ORAL | Status: DC

## 2015-07-09 MED ORDER — MIRTAZAPINE 15 MG PO TABS: 15.0000 mg | ORAL_TABLET | Freq: Every day | ORAL | Status: DC

## 2015-07-09 NOTE — Tx Team (Signed)
Interdisciplinary Treatment Plan Update (Adult) Date: 07/09/2015   Date: 07/09/2015 3:19 PM  Progress in Treatment:  Attending groups: Yes  Participating in groups: Yes  Taking medication as prescribed: Yes  Tolerating medication: Yes  Family/Significant othe contact made: No, Pt declines Patient understands diagnosis: Yes AEB seeking help with depresion Discussing patient identified problems/goals with staff: Yes  Medical problems stabilized or resolved: Yes  Denies suicidal/homicidal ideation: Yes Patient has not harmed self or Others: Yes   New problem(s) identified: None identified at this time.   Discharge Plan or Barriers: Pt will return home and follow-up with [facility] and [facility].  07/09/15: Pt was accepted to [facility]; will discharge there for continued treatment.  Additional comments:  Patient and CSW reviewed pt's identified goals and treatment plan. Patient verbalized understanding and agreed to treatment plan. CSW reviewed [facility] "Discharge Process and Patient Involvement" Form. Pt verbalized understanding of information provided and signed form.   Reason for Continuation of Hospitalization:  Depression Medication stabilization Suicidal ideation Withdrawal symptoms  Estimated length of stay: 0 days; Pt stable for discharge  Review of initial/current patient goals per problem list:   1.  Goal(s): Patient will participate in aftercare plan  Met:  Yes  Target date: 3-5 days from date of admission   As evidenced by: Patient will participate within aftercare plan AEB aftercare provider and housing plan at discharge being identified.   07/06/15: Pt will return home and follow-up with [facility] and [facility].  2.  Goal (s): Patient will exhibit decreased depressive symptoms and suicidal ideations.  Met:  Yes  Target date: 3-5 days from date of admission   As evidenced by: Patient will utilize self rating of depression at 3 or below and demonstrate decreased signs of depression or be  deemed stable for discharge by MD.  07/06/15: Pt rates depression at 0/10; denies SI 4.  Goal(s): Patient will demonstrate decreased signs of withdrawal due to substance abuse  Met:  Yes  Target date: 3-5 days from date of admission   As evidenced by: Patient will produce a CIWA/COWS score of 0, have stable vitals signs, and no symptoms of withdrawal  07/06/15: Pt is completing [facility] detox protocol. Denies symptoms of withdrawal.  07/09/15: Pt denies symptoms of withdrawal; CIWA score of 0  Attendees:  Patient:    Family:    Physician: Dr. Parke Poisson MD 07/09/2015 3:19 PM  Nursing: Lars Pinks, RN Case manager  07/09/2015 3:19 PM  Clinical Social Worker Peri Maris, [place] 07/09/2015 3:19 PM  Other: Tilden Fossa, [place] 07/09/2015 3:19 PM  Clinical: Mayra Neer, RN 07/09/2015 3:19 PM  Other: , RN Charge Nurse 07/09/2015 3:19 PM  Other:     Peri Maris, [place] Work [phone]

## 2015-07-09 NOTE — Progress Notes (Signed)
Discharge note:  Patient received all personal belongings.  She received prescriptions and a 14-day supply of medication samples. AVS/Discharge instructions reviewed with patient and she indicated understanding.  Patient will follow up with [facility] for medication management.  She denies SI/HI/AVH.  Patient left with [facility] representative.

## 2015-07-09 NOTE — Progress Notes (Signed)
  [facility] Adult Case Management Discharge Plan :  Will you be returning to the same living situation after discharge:  Yes,  Pt discharging to [facility] At discharge, do you have transportation home?: Yes,  [facility] staff to provide transportation to their facility Do you have the ability to pay for your medications: Yes,  Pt provided with 14-day supply for treatment.  Release of information consent forms completed and in the chart;  Patient's signature needed at discharge.  Patient to Follow up at: Follow-up Information    Follow up with [facility].   Specialty:  Behavioral Health   Why:  Please walk-in between 8am-3pm Monday-Friday within 7 days of discharge to be seen for your initial assessment and be set up for medication management and therapy.   Contact information:   [address] [place] [address] [phone]       [facility] with [facility].   Specialty:  Behavioral Health   Why:  Please walk-in on Tuesdays between 9am-12pm for your initial assessment for substance abuse outpatient treatment.   Contact information:   [address] [place] [address] [phone]       Follow up with [facility]. Go on 07/09/2015.   Why:  for admission for substance abuse treatment.   Contact information:   [address] [place] [phone]      Next level of care provider has access to [facility] Link:no  Safety Planning and Suicide Prevention discussed: Yes,  with Pt; declined family contact  Have you used any form of tobacco in the last 30 days? (Cigarettes, Smokeless Tobacco, Cigars, and/or Pipes): No  Has patient been referred to the Quitline?: N/A patient is not a smoker  Patient has been referred for addiction treatment: Yes- see above  Elaina Hoops 07/09/2015, 3:21 PM

## 2015-07-09 NOTE — BHH Suicide Risk Assessment (Signed)
[facility] Discharge Suicide Risk Assessment   Principal Problem: Alcoholism with alcohol dependence [facility]) Discharge Diagnoses:  Patient Active Problem List   Diagnosis Date Noted  . 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 Bipolar disorder, curr episode mixed, severe, with psychotic features ([facility]) [F31.64] 05/01/2015  . Uncontrolled diabetes mellitus with neurologic complication, with long-term current use of insulin (HCC) [E11.49, Z79.4, E11.65]   . Uncontrolled diabetes mellitus with diabetic neuropathy, with long-term current use of insulin (HCC) [E11.40, Z79.4, E11.65] 04/21/2015  . PTSD (post-traumatic stress disorder) [F43.10] 04/20/2015  . Alcohol dependence with withdrawal, uncomplicated (HCC) [F10.230] 04/20/2015  . Suicidal ideation [R45.851] 04/19/2015  . Recent bereavement [Z78.9] 04/19/2015  . Generalized anxiety disorder [F41.1] 02/13/2013  . Chronic posttraumatic stress disorder [F43.12] 02/13/2013  . Alcoholism with alcohol dependence (HCC) [F10.20] 02/08/2013  . Substance induced mood disorder (HCC) [F19.94] 02/08/2013  . IDDM (insulin dependent diabetes mellitus) (HCC) [E11.9, Z79.4] 02/08/2013    Total Time spent with patient: 30 minutes  Musculoskeletal: Strength & Muscle Tone: within normal limits Gait & Station: normal Patient leans: N/A  Psychiatric Specialty Exam: ROS  Blood pressure 127/84, pulse 102, temperature 98.5 F (36.9 C), temperature source Oral, resp. rate 20, height  (1.549 m), weight 197 lb (89.359 kg), last menstrual period 06/12/2015.Body mass index is 37.24 kg/(m^2).  General Appearance: improved grooming   Eye Contact::  Good  Speech:  Normal Rate409  Volume:  Normal  Mood:  improved, states she feels better   Affect:  Appropriate  Thought Process:  Linear  Orientation:  Full (Time, Place, and Person)  Thought Content:  denies hallucinations, no delusions , not internally  preoccupied   Suicidal Thoughts:  No denies any suicidal ideations, denies any self injurious ideations  Homicidal Thoughts:  No- denies any homicidal ideations, also specifically denies any violent or homicidal ideations towards BF or woman she thinks he is with- states " I have no intention of hurting them, I don't even want to see him, I plan to keep my distance because he is not good for me ".  Memory:  recent and remote grossly intact   Judgement:  Other:  improved  Insight:  improved   Psychomotor Activity:  Normal  Concentration:  Good  Recall:  Good  Fund of Knowledge:Good  Language: Good  Akathisia:  Negative  Handed:  Right  AIMS (if indicated):     Assets:  Communication Skills Desire for Improvement Resilience  Sleep:  Number of Hours: 4.25  Cognition: WNL  ADL's:  Intact   Mental Status Per Nursing Assessment::   On Admission:     Demographic Factors:  40 year old female   Loss Factors: Recent relationship stressors, unemployment , poor support system   Historical Factors: History of Mood Disorder, has been diagnosed with Bipolar Disorder in the past, history of Alcohol Dependence  Risk Reduction Factors:   Positive coping skills or problem solving skills  Continued Clinical Symptoms:  At this time patient improved compared to admission- mood improved, affect more reactive, no SI , no HI- see above, no current psychotic symptoms   Cognitive Features That Contribute To Risk:  No gross cognitive deficits noted upon discharge. Is alert , attentive, and oriented x 3   Suicide Risk:  Mild:  Suicidal ideation of limited frequency, intensity, duration, and specificity.  There are no identifiable plans, no associated intent, mild dysphoria and related symptoms, good self-control (both objective and subjective assessment), few  other risk factors, and identifiable protective factors, including available and accessible social support.  Follow-up Information    Follow  up with [facility].   Specialty:  Behavioral Health   Why:  Please walk-in between 8am-3pm Monday-Friday within 7 days of discharge to be seen for your initial assessment and be set up for medication management and therapy.   Contact information:   [address] [place] [address] [phone]       [facility] with [facility]   Specialty:  Behavioral Health   Why:  Please walk-in on Tuesdays between 9am-12pm for your initial assessment for substance abuse outpatient treatment.   Contact information:   [address] [place] [address] [phone]       Follow up with [facility]. Go on 07/09/2015.   Why:  for admission for substance abuse treatment.   Contact information:   [address] [place] [phone]      Plan Of Care/Follow-up recommendations:  Activity:  as tolerated Diet:  Diabetic Diet  Tests:  NA Other:  see below  Patient being discharged to [facility] .  Nehemiah Massed, MD 07/09/2015, 12:35 PM

## 2015-07-09 NOTE — Plan of Care (Signed)
Problem: Alteration in mood & ability to function due to Goal: STG-Patient will comply with prescribed medication regimen (Patient will comply with prescribed medication regimen)  Outcome: Progressing Client compliant with medication regime AEB taking medication, without incidence.

## 2015-07-09 NOTE — BHH Group Notes (Signed)
BHH Group Notes:  (Nursing/MHT/Case Management/Adjunct)  Date:  07/09/2015  Time:  0900 am  Type of Therapy:  Psychoeducational Skills  Participation Level:  Active  Participation Quality:  Appropriate and Attentive  Affect:  Appropriate  Cognitive:  Alert and Appropriate  Insight:  Improving  Engagement in Group:  Supportive  Modes of Intervention:  Support  Summary of Progress/Problems: Patient plans on discharging today.  Discharge plan in place.   [place] Mon 07/09/2015, 10:23 AM

## 2015-07-09 NOTE — Discharge Summary (Addendum)
Physician Discharge Summary Note  Patient:  Carolyn Ayala is an 40 y.o., female MRN:  [id number] DOB:  1976-02-26 Patient phone:  [phone] (home)  Patient address:   [address] [place] [address],  Total Time spent with patient: 30 minutes  Date of Admission:  07/03/2015 Date of Discharge: 07/09/2015  Reason for Admission:  Substance abuse  Principal Problem: Alcoholism with alcohol dependence [facility]) Discharge Diagnoses: Patient Active Problem List   Diagnosis Date Noted  . Alcohol dependence with alcohol-induced mood disorder (HCC) [F10.24]   . 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 Bipolar disorder, curr episode mixed, severe, with psychotic features (HCC) [F31.64] 05/01/2015  . Uncontrolled diabetes mellitus with neurologic complication, with long-term current use of insulin (HCC) [E11.49, Z79.4, E11.65]   . Uncontrolled diabetes mellitus with diabetic neuropathy, with long-term current use of insulin (HCC) [E11.40, Z79.4, E11.65] 04/21/2015  . PTSD (post-traumatic stress disorder) [F43.10] 04/20/2015  . Alcohol dependence with withdrawal, uncomplicated (HCC) [F10.230] 04/20/2015  . Suicidal ideation [R45.851] 04/19/2015  . Recent bereavement [Z78.9] 04/19/2015  . Generalized anxiety disorder [F41.1] 02/13/2013  . Chronic posttraumatic stress disorder [F43.12] 02/13/2013  . Alcoholism with alcohol dependence (HCC) [F10.20] 02/08/2013  . Substance induced mood disorder (HCC) [F19.94] 02/08/2013  . IDDM (insulin dependent diabetes mellitus) (HCC) [E11.9, Z79.4] 02/08/2013    Past Psychiatric History:  See above noted  Past Medical History:  Past Medical History  Diagnosis Date  . Mental disorder   . Depression   . Diabetes mellitus without complication (HCC)   . Hypertension     Past Surgical History  Procedure Laterality Date  . Cesarean section     Family History:   Family History  Problem Relation Age of Onset  . Depression Mother    Family Psychiatric  History:  See above noted Social History:  History  Alcohol Use  . Yes    Comment: daily     History  Drug Use No    Social History   Social History  . Marital Status: Single    Spouse Name: N/A  . Number of Children: N/A  . Years of Education: N/A   Social History Main Topics  . Smoking status: Never Smoker   . Smokeless tobacco: Current User  . Alcohol Use: Yes     Comment: daily  . Drug Use: No  . Sexual Activity: No   Other Topics Concern  . None   Social History Narrative   ** Merged History Encounter **        Hospital Course:  [name] was admitted for Alcoholism with alcohol dependence (HCC) and crisis management.  He was treated with the following medications listed below.  [name] was discharged with current medication and was instructed on how to take medications as prescribed; (details listed below under Medication List).  Medical problems were identified and treated as needed.  Home medications were restarted as appropriate.  Improvement was monitored by observation and [name] daily report of symptom reduction.  Emotional and mental status was monitored by daily self-inventory reports completed by [name] and clinical staff.         [name] was evaluated by the treatment team for stability and plans for continued recovery upon discharge.  [name] motivation was an integral factor for scheduling further treatment.  Employment, transportation, bed availability, health status, family support, and any pending legal issues were  also considered during his hospital stay.  He was offered further treatment options upon discharge including but not limited to Residential, Intensive Outpatient, and Outpatient treatment.  [name] will follow up with the services as listed  below under Follow Up Information.     Upon completion of this admission the [facility] was both mentally and medically stable for discharge denying suicidal/homicidal ideation, auditory/visual/tactile hallucinations, delusional thoughts and paranoia.     Physical Findings: AIMS: Facial and Oral Movements Muscles of Facial Expression: None, normal Lips and Perioral Area: None, normal Jaw: None, normal Tongue: None, normal,Extremity Movements Upper (arms, wrists, hands, fingers): None, normal Lower (legs, knees, ankles, toes): None, normal, Trunk Movements Neck, shoulders, hips: None, normal, Overall Severity Severity of abnormal movements (highest score from questions above): None, normal Incapacitation due to abnormal movements: None, normal Patient's awareness of abnormal movements (rate only patient's report): No Awareness,    CIWA:  CIWA-Ar Total: 1 COWS:     Musculoskeletal: Strength & Muscle Tone: within normal limits Gait & Station: normal Patient leans: N/A  Psychiatric Specialty Exam:  SEE MD SRA  Review of Systems  All other systems reviewed and are negative.   Blood pressure 127/84, pulse 102, temperature 98.5 F (36.9 C), temperature source Oral, resp. rate 20, height  (1.549 m), weight 89.359 kg (197 lb), last menstrual period 06/12/2015.Body mass index is 37.24 kg/(m^2).  Have you used any form of tobacco in the last 30 days? (Cigarettes, Smokeless Tobacco, Cigars, and/or Pipes): No  Has this patient used any form of tobacco in the last 30 days? (Cigarettes, Smokeless Tobacco, Cigars, and/or Pipes) Yes, offered  Metabolic Disorder Labs:  Lab Results  Component Value Date   HGBA1C 11.5* 04/19/2015   MPG 283 04/19/2015   MPG 240* 02/09/2013   Lab Results  Component Value Date   PROLACTIN 10.7 04/28/2015   Lab Results  Component Value Date   CHOL 233* 04/29/2015   TRIG 62 04/29/2015   HDL 89 04/29/2015   CHOLHDL 2.6 04/29/2015   VLDL 12  04/29/2015   LDLCALC 132* 04/29/2015   Discharge destination:  Home  See Psychiatric Specialty Exam and Suicide Risk Assessment completed by Attending Physician prior to discharge.   Is patient on multiple antipsychotic therapies at discharge:  No   Has Patient had three or more failed trials of antipsychotic monotherapy by history:  No  Recommended Plan for Multiple Antipsychotic Therapies: NA     Medication List    STOP taking these medications        benzocaine 10 % mucosal gel  Commonly known as:  ORAJEL     carisoprodol 350 MG tablet  Commonly known as:  SOMA     ferrous sulfate 325 (65 FE) MG tablet     hydrOXYzine 50 MG tablet  Commonly known as:  ATARAX/VISTARIL     insulin glargine 100 UNIT/ML injection  Commonly known as:  LANTUS     insulin lispro 100 UNIT/ML injection  Commonly known as:  HUMALOG     lisinopril 10 MG tablet  Commonly known as:  PRINIVIL,ZESTRIL     loratadine 10 MG tablet  Commonly known as:  CLARITIN     metFORMIN 500 MG tablet  Commonly known as:  GLUCOPHAGE     multivitamin with minerals tablet     thiamine 100 MG tablet     traMADol 50 MG tablet  Commonly known as:  ULTRAM      TAKE these medications  Indication   acamprosate 333 MG tablet  Commonly known as:  CAMPRAL  Take 2 tablets (666 mg total) by mouth 3 (three) times daily with meals.   Indication:  Excessive Use of Alcohol     ARIPiprazole 15 MG tablet  Commonly known as:  ABILIFY  Take 1 tablet (15 mg total) by mouth daily.   Indication:  psychosis     FLUoxetine 20 MG capsule  Commonly known as:  PROZAC  Take 1 capsule (20 mg total) by mouth daily.   Indication:  Depression     gabapentin 300 MG capsule  Commonly known as:  NEURONTIN  Take 3 capsules (900 mg total) by mouth 4 (four) times daily - after meals and at bedtime.   Indication:  Neuropathic Pain     mirtazapine 15 MG tablet  Commonly known as:  REMERON  Take 1 tablet (15 mg total) by  mouth at bedtime.   Indication:  Trouble Sleeping, Major Depressive Disorder           Follow-up Information    Follow up with [facility].   Specialty:  Behavioral Health   Why:  Please walk-in between 8am-3pm Monday-Friday within 7 days of discharge to be seen for your initial assessment and be set up for medication management and therapy.   Contact information:   [address] [place] [address] [phone]       Follow up with [facility].   Specialty:  Behavioral Health   Why:  Please walk-in on Tuesdays between 9am-12pm for your initial assessment for substance abuse outpatient treatment.   Contact information:   [address] [place] [address] [phone]       Follow up with [facility]. Go on 07/09/2015.   Why:  for admission for substance abuse treatment.   Contact information:   [address] [place] [phone]      Follow-up recommendations:  Activity:  as tol Diet:  as tol  Comments:  1.  Take all your medications as prescribed.              2.  Report any adverse side effects to outpatient provider.                       3.  Patient instructed to not use alcohol or illegal drugs while on prescription medicines.            4.  In the event of worsening symptoms, instructed patient to call 911, the crisis hotline or go to nearest emergency room for evaluation of symptoms.  Signed: Velna Hatchet May Agustin AGNP-BC 07/09/2015, 4:07 PM  Patient seen, Suicide Assessment Completed.  Disposition Plan Reviewed  [facility]

## 2015-08-01 ENCOUNTER — Inpatient Hospital Stay (HOSPITAL_COMMUNITY)
Admission: EM | Admit: 2015-08-01 | Discharge: 2015-08-05 | DRG: 638 | Disposition: A | Discharge status: Other Acute Inpatient Hospital | Payer: Federal, State, Local not specified - Other | Attending: Internal Medicine | Admitting: Internal Medicine

## 2015-08-01 ENCOUNTER — Encounter (HOSPITAL_COMMUNITY): Payer: Self-pay

## 2015-08-01 DIAGNOSIS — Z720 Tobacco use: Secondary | ICD-10-CM

## 2015-08-01 DIAGNOSIS — E119 Type 2 diabetes mellitus without complications: Secondary | ICD-10-CM

## 2015-08-01 DIAGNOSIS — F1010 Alcohol abuse, uncomplicated: Secondary | ICD-10-CM | POA: Insufficient documentation

## 2015-08-01 DIAGNOSIS — Z823 Family history of stroke: Secondary | ICD-10-CM

## 2015-08-01 DIAGNOSIS — R45851 Suicidal ideations: Secondary | ICD-10-CM | POA: Diagnosis not present

## 2015-08-01 DIAGNOSIS — F1994 Other psychoactive substance use, unspecified with psychoactive substance-induced mood disorder: Secondary | ICD-10-CM | POA: Diagnosis present

## 2015-08-01 DIAGNOSIS — E1065 Type 1 diabetes mellitus with hyperglycemia: Secondary | ICD-10-CM | POA: Diagnosis present

## 2015-08-01 DIAGNOSIS — E1310 Other specified diabetes mellitus with ketoacidosis without coma: Secondary | ICD-10-CM

## 2015-08-01 DIAGNOSIS — F411 Generalized anxiety disorder: Secondary | ICD-10-CM | POA: Diagnosis present

## 2015-08-01 DIAGNOSIS — Z79899 Other long term (current) drug therapy: Secondary | ICD-10-CM

## 2015-08-01 DIAGNOSIS — Y905 Blood alcohol level of 100-119 mg/100 ml: Secondary | ICD-10-CM | POA: Diagnosis present

## 2015-08-01 DIAGNOSIS — I10 Essential (primary) hypertension: Secondary | ICD-10-CM | POA: Diagnosis present

## 2015-08-01 DIAGNOSIS — F333 Major depressive disorder, recurrent, severe with psychotic symptoms: Secondary | ICD-10-CM | POA: Diagnosis present

## 2015-08-01 DIAGNOSIS — F1021 Alcohol dependence, in remission: Secondary | ICD-10-CM

## 2015-08-01 DIAGNOSIS — IMO0001 Reserved for inherently not codable concepts without codable children: Secondary | ICD-10-CM

## 2015-08-01 DIAGNOSIS — Z818 Family history of other mental and behavioral disorders: Secondary | ICD-10-CM

## 2015-08-01 DIAGNOSIS — E108 Type 1 diabetes mellitus with unspecified complications: Secondary | ICD-10-CM

## 2015-08-01 DIAGNOSIS — F1020 Alcohol dependence, uncomplicated: Secondary | ICD-10-CM | POA: Diagnosis present

## 2015-08-01 DIAGNOSIS — E1010 Type 1 diabetes mellitus with ketoacidosis without coma: Principal | ICD-10-CM | POA: Diagnosis present

## 2015-08-01 DIAGNOSIS — Z794 Long term (current) use of insulin: Secondary | ICD-10-CM

## 2015-08-01 DIAGNOSIS — E1029 Type 1 diabetes mellitus with other diabetic kidney complication: Secondary | ICD-10-CM | POA: Diagnosis not present

## 2015-08-01 DIAGNOSIS — IMO0002 Reserved for concepts with insufficient information to code with codable children: Secondary | ICD-10-CM

## 2015-08-01 DIAGNOSIS — R0789 Other chest pain: Secondary | ICD-10-CM | POA: Diagnosis present

## 2015-08-01 DIAGNOSIS — E1042 Type 1 diabetes mellitus with diabetic polyneuropathy: Secondary | ICD-10-CM | POA: Diagnosis present

## 2015-08-01 DIAGNOSIS — E1110 Type 2 diabetes mellitus with ketoacidosis without coma: Secondary | ICD-10-CM

## 2015-08-01 DIAGNOSIS — F1024 Alcohol dependence with alcohol-induced mood disorder: Secondary | ICD-10-CM | POA: Diagnosis present

## 2015-08-01 DIAGNOSIS — F1310 Sedative, hypnotic or anxiolytic abuse, uncomplicated: Secondary | ICD-10-CM

## 2015-08-01 DIAGNOSIS — R000 Tachycardia, unspecified: Secondary | ICD-10-CM | POA: Diagnosis present

## 2015-08-01 LAB — CBC WITH DIFFERENTIAL/PLATELET
Basophils Absolute: 0 10*3/uL (ref 0.0–0.1)
Basophils Relative: 0 %
Eosinophils Absolute: 0.2 10*3/uL (ref 0.0–0.7)
Eosinophils Relative: 3 %
HCT: 38.1 % (ref 36.0–46.0)
Hemoglobin: 12.2 g/dL (ref 12.0–15.0)
Lymphocytes Relative: 39 %
Lymphs Abs: 3 10*3/uL (ref 0.7–4.0)
MCH: 24.9 pg — ABNORMAL LOW (ref 26.0–34.0)
MCHC: 32 g/dL (ref 30.0–36.0)
MCV: 77.9 fL — ABNORMAL LOW (ref 78.0–100.0)
Monocytes Absolute: 0.4 10*3/uL (ref 0.1–1.0)
Monocytes Relative: 5 %
Neutro Abs: 4.1 10*3/uL (ref 1.7–7.7)
Neutrophils Relative %: 53 %
Platelets: 335 10*3/uL (ref 150–400)
RBC: 4.89 MIL/uL (ref 3.87–5.11)
RDW: 16.2 % — ABNORMAL HIGH (ref 11.5–15.5)
WBC: 7.7 10*3/uL (ref 4.0–10.5)

## 2015-08-01 LAB — POCT I-STAT TROPONIN I: Troponin i, poc: 0 ng/mL (ref 0.00–0.08)

## 2015-08-01 LAB — SALICYLATE LEVEL: Salicylate Lvl: <4 mg/dL (ref 2.8–30.0)

## 2015-08-01 LAB — COMPREHENSIVE METABOLIC PANEL
ALT: 34 U/L (ref 14–54)
AST: 25 U/L (ref 15–41)
Albumin: 4 g/dL (ref 3.5–5.0)
Alkaline Phosphatase: 103 U/L (ref 38–126)
Anion gap: 15 (ref 5–15)
BUN: 10 mg/dL (ref 6–20)
CO2: 18 mmol/L — ABNORMAL LOW (ref 22–32)
Calcium: 9.5 mg/dL (ref 8.9–10.3)
Chloride: 101 mmol/L (ref 101–111)
Creatinine, Ser: 0.56 mg/dL (ref 0.44–1.00)
GFR calc Af Amer: >60 mL/min (ref >60)
GFR calc non Af Amer: >60 mL/min (ref >60)
Glucose, Bld: 460 mg/dL — ABNORMAL HIGH (ref 65–99)
Potassium: 4.1 mmol/L (ref 3.5–5.1)
Sodium: 134 mmol/L — ABNORMAL LOW (ref 135–145)
Total Bilirubin: 0.3 mg/dL (ref 0.3–1.2)
Total Protein: 7.9 g/dL (ref 6.5–8.1)

## 2015-08-01 LAB — BLOOD GAS, VENOUS
Acid-base deficit: 6.8 mmol/L — ABNORMAL HIGH (ref 0.0–2.0)
Bicarbonate: 16.4 mEq/L — ABNORMAL LOW (ref 20.0–24.0)
Drawn by: 280161
O2 Saturation: 98.9 %
Patient temperature: 98.6
TCO2: 14.7 mmol/L (ref 0–100)
pCO2, Ven: 27.5 mmHg — ABNORMAL LOW (ref 45.0–50.0)
pH, Ven: 7.393 — ABNORMAL HIGH (ref 7.250–7.300)
pO2, Ven: 190 mmHg — ABNORMAL HIGH (ref 30.0–45.0)

## 2015-08-01 LAB — CBG MONITORING, ED
Glucose-Capillary: 422 mg/dL — ABNORMAL HIGH (ref 65–99)
Glucose-Capillary: 373 mg/dL — ABNORMAL HIGH (ref 65–99)
Glucose-Capillary: 353 mg/dL — ABNORMAL HIGH (ref 65–99)
Glucose-Capillary: 275 mg/dL — ABNORMAL HIGH (ref 65–99)

## 2015-08-01 LAB — TSH: TSH: 1.752 u[IU]/mL (ref 0.350–4.500)

## 2015-08-01 LAB — GLUCOSE, CAPILLARY
Glucose-Capillary: 420 mg/dL — ABNORMAL HIGH (ref 65–99)
Glucose-Capillary: 336 mg/dL — ABNORMAL HIGH (ref 65–99)
Glucose-Capillary: 279 mg/dL — ABNORMAL HIGH (ref 65–99)
Glucose-Capillary: 307 mg/dL — ABNORMAL HIGH (ref 65–99)
Glucose-Capillary: 364 mg/dL — ABNORMAL HIGH (ref 65–99)

## 2015-08-01 LAB — POCT I-STAT, CHEM 8
BUN: 11 mg/dL (ref 6–20)
Calcium, Ion: 1.03 mmol/L — ABNORMAL LOW (ref 1.12–1.23)
Chloride: 102 mmol/L (ref 101–111)
Creatinine, Ser: 0.6 mg/dL (ref 0.44–1.00)
Glucose, Bld: 476 mg/dL — ABNORMAL HIGH (ref 65–99)
HCT: 42 % (ref 36.0–46.0)
Hemoglobin: 14.3 g/dL (ref 12.0–15.0)
Potassium: 5 mmol/L (ref 3.5–5.1)
Sodium: 133 mmol/L — ABNORMAL LOW (ref 135–145)
TCO2: 18 mmol/L (ref 0–100)

## 2015-08-01 LAB — TROPONIN I
Troponin I: <0.03 ng/mL (ref <0.031)
Troponin I: <0.03 ng/mL (ref <0.031)
Troponin I: <0.03 ng/mL (ref <0.031)

## 2015-08-01 LAB — URINE MICROSCOPIC-ADD ON
Bacteria, UA: NONE SEEN
RBC / HPF: NONE SEEN RBC/hpf (ref 0–5)

## 2015-08-01 LAB — URINALYSIS, ROUTINE W REFLEX MICROSCOPIC
Bilirubin Urine: NEGATIVE
Glucose, UA: >1000 mg/dL — AB
Hgb urine dipstick: NEGATIVE
Ketones, ur: NEGATIVE mg/dL
Leukocytes, UA: NEGATIVE
Nitrite: NEGATIVE
Protein, ur: NEGATIVE mg/dL
Specific Gravity, Urine: 1.025 (ref 1.005–1.030)
pH: 6 (ref 5.0–8.0)

## 2015-08-01 LAB — I-STAT BETA HCG BLOOD, ED (NOT ORDERABLE): I-stat hCG, quantitative: <5 m[IU]/mL (ref <5)

## 2015-08-01 LAB — RAPID URINE DRUG SCREEN, HOSP PERFORMED
Amphetamines: NOT DETECTED
Barbiturates: NOT DETECTED
Benzodiazepines: POSITIVE — AB
Cocaine: NOT DETECTED
Opiates: NOT DETECTED
Tetrahydrocannabinol: NOT DETECTED

## 2015-08-01 LAB — ETHANOL: Alcohol, Ethyl (B): 108 mg/dL — ABNORMAL HIGH (ref <5)

## 2015-08-01 LAB — ACETAMINOPHEN LEVEL: Acetaminophen (Tylenol), Serum: <10 ug/mL — ABNORMAL LOW (ref 10–30)

## 2015-08-01 LAB — MAGNESIUM: Magnesium: 1.7 mg/dL (ref 1.7–2.4)

## 2015-08-01 MED ORDER — INSULIN GLARGINE 100 UNIT/ML ~~LOC~~ SOLN
20.0000 [IU] | Freq: Once | SUBCUTANEOUS | Status: AC
  Administered 2015-08-01: 20 [IU] via SUBCUTANEOUS
  Filled 2015-08-01: qty 0.2

## 2015-08-01 MED ORDER — ACETAMINOPHEN 325 MG PO TABS
650.0000 mg | ORAL_TABLET | Freq: Four times a day (QID) | ORAL | Status: DC | PRN
  Administered 2015-08-01 – 2015-08-05 (×9): 650 mg via ORAL
  Filled 2015-08-01 (×9): qty 2

## 2015-08-01 MED ORDER — ONDANSETRON HCL 4 MG/2ML IJ SOLN: 4.0000 mg | Freq: Four times a day (QID) | INTRAMUSCULAR | Status: DC | PRN

## 2015-08-01 MED ORDER — ENOXAPARIN SODIUM 40 MG/0.4ML ~~LOC~~ SOLN
40.0000 mg | SUBCUTANEOUS | Status: DC
  Administered 2015-08-01 – 2015-08-04 (×4): 40 mg via SUBCUTANEOUS
  Filled 2015-08-01 (×5): qty 0.4

## 2015-08-01 MED ORDER — LEVALBUTEROL HCL 0.63 MG/3ML IN NEBU: 0.6300 mg | INHALATION_SOLUTION | Freq: Four times a day (QID) | RESPIRATORY_TRACT | Status: DC | PRN

## 2015-08-01 MED ORDER — INSULIN ASPART 100 UNIT/ML ~~LOC~~ SOLN
0.0000 [IU] | Freq: Every day | SUBCUTANEOUS | Status: DC
  Administered 2015-08-01: 5 [IU] via SUBCUTANEOUS

## 2015-08-01 MED ORDER — LORAZEPAM 2 MG/ML IJ SOLN
1.0000 mg | Freq: Four times a day (QID) | INTRAMUSCULAR | Status: AC | PRN
  Administered 2015-08-01 – 2015-08-02 (×5): 1 mg via INTRAVENOUS
  Filled 2015-08-01 (×5): qty 1

## 2015-08-01 MED ORDER — SODIUM CHLORIDE 0.9 % IV SOLN
INTRAVENOUS | Status: DC
  Administered 2015-08-01 (×2): via INTRAVENOUS

## 2015-08-01 MED ORDER — ARIPIPRAZOLE 15 MG PO TABS
15.0000 mg | ORAL_TABLET | Freq: Every day | ORAL | Status: DC
Start: 2015-08-01 — End: 2015-08-05
  Administered 2015-08-01 – 2015-08-05 (×5): 15 mg via ORAL
  Filled 2015-08-01 (×5): qty 1

## 2015-08-01 MED ORDER — SODIUM CHLORIDE 0.9 % IV SOLN: Freq: Once | INTRAVENOUS | Status: DC

## 2015-08-01 MED ORDER — LORAZEPAM 1 MG PO TABS
1.0000 mg | ORAL_TABLET | Freq: Four times a day (QID) | ORAL | Status: AC | PRN
  Administered 2015-08-02 – 2015-08-04 (×4): 1 mg via ORAL
  Filled 2015-08-01 (×5): qty 1

## 2015-08-01 MED ORDER — SODIUM CHLORIDE 0.9 % IV BOLUS (SEPSIS)
1000.0000 mL | Freq: Once | INTRAVENOUS | Status: AC
  Administered 2015-08-01: 1000 mL via INTRAVENOUS

## 2015-08-01 MED ORDER — GABAPENTIN 300 MG PO CAPS
900.0000 mg | ORAL_CAPSULE | Freq: Three times a day (TID) | ORAL | Status: DC
  Administered 2015-08-01 – 2015-08-05 (×17): 900 mg via ORAL
  Filled 2015-08-01 (×20): qty 3

## 2015-08-01 MED ORDER — NALOXONE HCL 0.4 MG/ML IJ SOLN
0.4000 mg | Freq: Once | INTRAMUSCULAR | Status: AC
  Administered 2015-08-01: 0.4 mg via INTRAVENOUS
  Filled 2015-08-01: qty 1

## 2015-08-01 MED ORDER — ACETAMINOPHEN 650 MG RE SUPP: 650.0000 mg | Freq: Four times a day (QID) | RECTAL | Status: DC | PRN

## 2015-08-01 MED ORDER — ADULT MULTIVITAMIN W/MINERALS CH
1.0000 | ORAL_TABLET | Freq: Every day | ORAL | Status: DC
  Administered 2015-08-01 – 2015-08-05 (×5): 1 via ORAL
  Filled 2015-08-01 (×5): qty 1

## 2015-08-01 MED ORDER — FLUOXETINE HCL 20 MG PO CAPS
40.0000 mg | ORAL_CAPSULE | Freq: Every day | ORAL | Status: DC
  Administered 2015-08-01 – 2015-08-05 (×5): 40 mg via ORAL
  Filled 2015-08-01 (×5): qty 2

## 2015-08-01 MED ORDER — INSULIN ASPART 100 UNIT/ML ~~LOC~~ SOLN
10.0000 [IU] | Freq: Once | SUBCUTANEOUS | Status: AC
  Administered 2015-08-01: 10 [IU] via SUBCUTANEOUS

## 2015-08-01 MED ORDER — INSULIN REGULAR HUMAN 100 UNIT/ML IJ SOLN
INTRAMUSCULAR | Status: DC
  Filled 2015-08-01: qty 2.5

## 2015-08-01 MED ORDER — MIRTAZAPINE 15 MG PO TABS
15.0000 mg | ORAL_TABLET | Freq: Every day | ORAL | Status: DC
  Administered 2015-08-01 – 2015-08-04 (×3): 15 mg via ORAL
  Filled 2015-08-01 (×5): qty 1

## 2015-08-01 MED ORDER — SODIUM CHLORIDE 0.9% FLUSH
3.0000 mL | Freq: Two times a day (BID) | INTRAVENOUS | Status: DC
  Administered 2015-08-01 – 2015-08-05 (×7): 3 mL via INTRAVENOUS

## 2015-08-01 MED ORDER — FOLIC ACID 1 MG PO TABS
1.0000 mg | ORAL_TABLET | Freq: Every day | ORAL | Status: DC
  Administered 2015-08-01 – 2015-08-05 (×5): 1 mg via ORAL
  Filled 2015-08-01 (×5): qty 1

## 2015-08-01 MED ORDER — INSULIN GLARGINE 100 UNIT/ML ~~LOC~~ SOLN
35.0000 [IU] | Freq: Every day | SUBCUTANEOUS | Status: DC
  Administered 2015-08-01: 35 [IU] via SUBCUTANEOUS
  Filled 2015-08-01: qty 0.35

## 2015-08-01 MED ORDER — GABAPENTIN 300 MG PO CAPS
900.0000 mg | ORAL_CAPSULE | Freq: Three times a day (TID) | ORAL | Status: DC
  Filled 2015-08-01 (×3): qty 3

## 2015-08-01 MED ORDER — THIAMINE HCL 100 MG/ML IJ SOLN
100.0000 mg | Freq: Every day | INTRAMUSCULAR | Status: DC
  Filled 2015-08-01 (×4): qty 1

## 2015-08-01 MED ORDER — DEXTROSE-NACL 5-0.45 % IV SOLN: INTRAVENOUS | Status: DC

## 2015-08-01 MED ORDER — SODIUM CHLORIDE 0.9 % IV BOLUS (SEPSIS)
500.0000 mL | Freq: Once | INTRAVENOUS | Status: AC
  Administered 2015-08-01: 500 mL via INTRAVENOUS

## 2015-08-01 MED ORDER — ONDANSETRON HCL 4 MG PO TABS
4.0000 mg | ORAL_TABLET | Freq: Four times a day (QID) | ORAL | Status: DC | PRN
Start: 2015-08-01 — End: 2015-08-05

## 2015-08-01 MED ORDER — INSULIN GLARGINE 100 UNIT/ML ~~LOC~~ SOLN
35.0000 [IU] | Freq: Two times a day (BID) | SUBCUTANEOUS | Status: DC
  Administered 2015-08-01 – 2015-08-02 (×2): 35 [IU] via SUBCUTANEOUS
  Filled 2015-08-01 (×2): qty 0.35

## 2015-08-01 MED ORDER — INSULIN ASPART 100 UNIT/ML ~~LOC~~ SOLN
0.0000 [IU] | Freq: Three times a day (TID) | SUBCUTANEOUS | Status: DC
  Administered 2015-08-01: 5 [IU] via SUBCUTANEOUS
  Administered 2015-08-01: 9 [IU] via SUBCUTANEOUS
  Administered 2015-08-02: 5 [IU] via SUBCUTANEOUS
  Filled 2015-08-01 (×2): qty 1

## 2015-08-01 MED ORDER — SODIUM CHLORIDE 0.9 % IV SOLN
INTRAVENOUS | Status: DC
  Filled 2015-08-01: qty 2.5

## 2015-08-01 MED ORDER — VITAMIN B-1 100 MG PO TABS
100.0000 mg | ORAL_TABLET | Freq: Every day | ORAL | Status: DC
  Administered 2015-08-01 – 2015-08-05 (×5): 100 mg via ORAL
  Filled 2015-08-01 (×5): qty 1

## 2015-08-01 NOTE — ED Notes (Signed)
Pt given breakfast tray

## 2015-08-01 NOTE — ED Notes (Signed)
Pt BIB EMS stating "mother died a month ago, she became anxious so she got on the bus to [facility]. When she got there she had a couple of beers. L arm started hurting. She experienced some chest pain, but that went away."  Endorses trying to OD on insulin a few days ago, but was unsuccessful. Pt states "I just want to join my mother." A&Ox4.

## 2015-08-01 NOTE — ED Notes (Signed)
Bed: WA16 Expected date:  Expected time:  Means of arrival:  Comments: Room 3 

## 2015-08-01 NOTE — ED Notes (Signed)
CBG 275 

## 2015-08-01 NOTE — ED Provider Notes (Signed)
CSN: [id number]     Arrival date & time 08/01/15  0400 History   First MD Initiated Contact with Patient 08/01/15 0405     Chief Complaint  Patient presents with  . Hyperglycemia  . Anxiety     (Consider location/radiation/quality/duration/timing/severity/associated sxs/prior Treatment) Patient is a 40 y.o. female presenting with hyperglycemia and anxiety.  Hyperglycemia Severity:  Moderate Onset quality:  Gradual Timing:  Constant Progression:  Unchanged Chronicity:  Recurrent Diabetes status:  Controlled with insulin and controlled with oral medications Context: not change in medication   Relieved by:  Nothing Ineffective treatments:  None tried Associated symptoms: no diaphoresis and no weight change   Risk factors: obesity   Anxiety  Recently admitted to the hospital and then [facility] for insulin overdose.  Stated her mother recently passed away and then told EMS and states she "wants to join her mother." Was escorted to the bathroom and then admitted to baracading herself in.  When she emerged was slightly somnolent and was refusing all work up stating all she has is gas and wants to go home and doesn't want [facility] involved.  Refusing to tell EDP if she took too many meds  Past Medical History  Diagnosis Date  . Mental disorder   . Depression   . Diabetes mellitus without complication (HCC)   . Hypertension    Past Surgical History  Procedure Laterality Date  . Cesarean section     Family History  Problem Relation Age of Onset  . Depression Mother    Social History  Substance Use Topics  . Smoking status: Never Smoker   . Smokeless tobacco: Current User  . Alcohol Use: Yes     Comment: daily   OB History    No data available     Review of Systems  Constitutional: Negative for diaphoresis.  All other systems reviewed and are negative.     Allergies  Review of patient's allergies indicates no known allergies.  Home Medications   Prior to Admission  medications   Medication Sig Start Date End Date Taking? Authorizing Provider  acamprosate (CAMPRAL) 333 MG tablet Take 2 tablets (666 mg total) by mouth 3 (three) times daily with meals. 07/09/15   Adonis Brook, NP  ARIPiprazole (ABILIFY) 15 MG tablet Take 1 tablet (15 mg total) by mouth daily. 07/09/15   Adonis Brook, NP  FLUoxetine (PROZAC) 20 MG capsule Take 1 capsule (20 mg total) by mouth daily. 07/09/15   Adonis Brook, NP  gabapentin (NEURONTIN) 300 MG capsule Take 3 capsules (900 mg total) by mouth 4 (four) times daily - after meals and at bedtime. 07/09/15   Adonis Brook, NP  mirtazapine (REMERON) 15 MG tablet Take 1 tablet (15 mg total) by mouth at bedtime. 07/09/15   Adonis Brook, NP   BP 121/84 mmHg  Pulse 107  Temp(Src) 98.4 F (36.9 C) (Oral)  Resp 16  SpO2 94%  LMP 06/12/2015 Physical Exam  Constitutional: She is oriented to person, place, and time. She appears well-developed and well-nourished. No distress.  HENT:  Head: Normocephalic and atraumatic.  Left Ear: External ear normal.  Mouth/Throat: Oropharynx is clear and moist.  Eyes: Conjunctivae are normal. Pupils are equal, round, and reactive to light.  Neck: Normal range of motion. Neck supple.  Cardiovascular: Normal rate, regular rhythm and intact distal pulses.   Pulmonary/Chest: Effort normal and breath sounds normal. No respiratory distress. She has no wheezes. She has no rales.  Abdominal: Soft. Bowel sounds are normal. There  is no tenderness. There is no rebound and no guarding.  Musculoskeletal: Normal range of motion. She exhibits no edema or tenderness.  Neurological: She is alert and oriented to person, place, and time.  Skin: Skin is warm and dry.  Psychiatric: She has a normal mood and affect.    ED Course  Procedures (including critical care time) Labs Review Labs Reviewed  CBC WITH DIFFERENTIAL/PLATELET  ETHANOL  URINE RAPID DRUG SCREEN, HOSP PERFORMED  URINALYSIS, ROUTINE W REFLEX  MICROSCOPIC (NOT AT [facility])  ACETAMINOPHEN LEVEL  SALICYLATE LEVEL  COMPREHENSIVE METABOLIC PANEL  BLOOD GAS, VENOUS  I-STAT CHEM 8, ED  CBG MONITORING, ED  I-STAT TROPOININ, ED  I-STAT BETA HCG BLOOD, ED ([facility], [facility], [facility] ONLY)    Imaging Review No results found. I have personally reviewed and evaluated these images and lab results as part of my medical decision-making.   EKG Interpretation   Date/Time:  Saturday August 01 2015 04:25:08 EST Ventricular Rate:  102 PR Interval:  122 QRS Duration: 77 QT Interval:  367 QTC Calculation: 478 R Axis:   60 Text Interpretation:  Sinus tachycardia Confirmed by [facility] Hights  MD,  Verne Lanuza ([id number]) on 08/01/2015 4:33:25 AM      MDM   Final diagnoses:  None    Under IVC for suicidal statements and baracading herself in the bathroom and coming out somnolent with benzos in her urine, benzos that she does not legally hold an RX for.    Patient will be admitted medically for DKA and then will need [facility] for suicidality   Results for orders placed or performed during the hospital encounter of 08/01/15  CBC with Differential/Platelet  Result Value Ref Range   WBC 7.7 4.0 - 10.5 K/uL   RBC 4.89 3.87 - 5.11 MIL/uL   Hemoglobin 12.2 12.0 - 15.0 g/dL   HCT 60.4 54.0 - 98.1 %   MCV 77.9 (L) 78.0 - 100.0 fL   MCH 24.9 (L) 26.0 - 34.0 pg   MCHC 32.0 30.0 - 36.0 g/dL   RDW 19.1 (H) 47.8 - 29.5 %   Platelets 335 150 - 400 K/uL   Neutrophils Relative % 53 %   Neutro Abs 4.1 1.7 - 7.7 K/uL   Lymphocytes Relative 39 %   Lymphs Abs 3.0 0.7 - 4.0 K/uL   Monocytes Relative 5 %   Monocytes Absolute 0.4 0.1 - 1.0 K/uL   Eosinophils Relative 3 %   Eosinophils Absolute 0.2 0.0 - 0.7 K/uL   Basophils Relative 0 %   Basophils Absolute 0.0 0.0 - 0.1 K/uL  Ethanol  Result Value Ref Range   Alcohol, Ethyl (B) 108 (H) <5 mg/dL  Urine rapid drug screen (hosp performed)  Result Value Ref Range   Opiates NONE DETECTED NONE DETECTED   Cocaine NONE DETECTED  NONE DETECTED   Benzodiazepines POSITIVE (A) NONE DETECTED   Amphetamines NONE DETECTED NONE DETECTED   Tetrahydrocannabinol NONE DETECTED NONE DETECTED   Barbiturates NONE DETECTED NONE DETECTED  Urinalysis, Routine w reflex microscopic (not at [facility])  Result Value Ref Range   Color, Urine YELLOW YELLOW   APPearance CLEAR CLEAR   Specific Gravity, Urine 1.025 1.005 - 1.030   pH 6.0 5.0 - 8.0   Glucose, UA >1000 (A) NEGATIVE mg/dL   Hgb urine dipstick NEGATIVE NEGATIVE   Bilirubin Urine NEGATIVE NEGATIVE   Ketones, ur NEGATIVE NEGATIVE mg/dL   Protein, ur NEGATIVE NEGATIVE mg/dL   Nitrite NEGATIVE NEGATIVE   Leukocytes, UA NEGATIVE NEGATIVE  Acetaminophen  level  Result Value Ref Range   Acetaminophen (Tylenol), Serum <10 (L) 10 - 30 ug/mL  Salicylate level  Result Value Ref Range   Salicylate Lvl <4.0 2.8 - 30.0 mg/dL  Comprehensive metabolic panel  Result Value Ref Range   Sodium 134 (L) 135 - 145 mmol/L   Potassium 4.1 3.5 - 5.1 mmol/L   Chloride 101 101 - 111 mmol/L   CO2 18 (L) 22 - 32 mmol/L   Glucose, Bld 460 (H) 65 - 99 mg/dL   BUN 10 6 - 20 mg/dL   Creatinine, Ser 4.54 0.44 - 1.00 mg/dL   Calcium 9.5 8.9 - 09.8 mg/dL   Total Protein 7.9 6.5 - 8.1 g/dL   Albumin 4.0 3.5 - 5.0 g/dL   AST 25 15 - 41 U/L   ALT 34 14 - 54 U/L   Alkaline Phosphatase 103 38 - 126 U/L   Total Bilirubin 0.3 0.3 - 1.2 mg/dL   GFR calc non Af Amer >60 >60 mL/min   GFR calc Af Amer >60 >60 mL/min   Anion gap 15 5 - 15  Blood gas, venous  Result Value Ref Range   pH, Ven 7.393 (H) 7.250 - 7.300   pCO2, Ven 27.5 (L) 45.0 - 50.0 mmHg   pO2, Ven 190.0 (H) 30.0 - 45.0 mmHg   Bicarbonate 16.4 (L) 20.0 - 24.0 mEq/L   TCO2 14.7 0 - 100 mmol/L   Acid-base deficit 6.8 (H) 0.0 - 2.0 mmol/L   O2 Saturation 98.9 %   Patient temperature 98.6    Collection site VEIN    Drawn by [id number]    Sample type VEIN   Urine microscopic-add on  Result Value Ref Range   Squamous Epithelial / LPF 0-5 (A) NONE  SEEN   WBC, UA 0-5 0 - 5 WBC/hpf   RBC / HPF NONE SEEN 0 - 5 RBC/hpf   Bacteria, UA NONE SEEN NONE SEEN  POC CBG, ED  Result Value Ref Range   Glucose-Capillary 422 (H) 65 - 99 mg/dL   Comment 1 Notify RN    Dg Abd Acute W/chest  07/03/2015  CLINICAL DATA:  Acute onset of vomiting.  Initial encounter. EXAM: DG ABDOMEN ACUTE W/ 1V CHEST COMPARISON:  Chest radiograph performed 04/16/2015 FINDINGS: The lungs are well-aerated and clear. There is no evidence of focal opacification, pleural effusion or pneumothorax. The cardiomediastinal silhouette is borderline enlarged. The visualized bowel gas pattern is unremarkable. Scattered stool and air are seen within the colon; there is no evidence of small bowel dilatation to suggest obstruction. No free intra-abdominal air is identified on the provided decubitus view. No acute osseous abnormalities are seen; the sacroiliac joints are unremarkable in appearance. A calcified uterine fibroid is seen. IMPRESSION: 1. Unremarkable bowel gas pattern; no free intra-abdominal air seen. Moderate amount of stool noted in the colon. 2. Borderline cardiomegaly.  No acute cardiopulmonary process seen. Electronically Signed   By: Roanna Raider M.D.   On: 07/03/2015 02:08    Medications  insulin regular (NOVOLIN R,HUMULIN R) 250 Units in sodium chloride 0.9 % 250 mL (1 Units/mL) infusion (not administered)  dextrose 5 %-0.45 % sodium chloride infusion (not administered)  sodium chloride 0.9 % bolus 1,000 mL (1,000 mLs Intravenous New Bag/Given 08/01/15 0605)  naloxone [facility]) injection 0.4 mg (0.4 mg Intravenous Given 08/01/15 0605)   MDM Reviewed: previous chart, nursing note and vitals Reviewed previous: labs Interpretation: labs and ECG (elevated anion gap,  positive benzos in the urine.  Negative troponin) Total time providing critical care: 75-105 minutes. This excludes time spent performing separately reportable procedures and services. Consults: admitting  MD    CRITICAL CARE Performed by: Jasmine Awe Total critical care time: 90  minutes Critical care time was exclusive of separately billable procedures and treating other patients. Critical care was necessary to treat or prevent imminent or life-threatening deterioration. Critical care was time spent personally by me on the following activities: development of treatment plan with patient and/or surrogate as well as nursing, discussions with consultants, evaluation of patient's response to treatment, examination of patient, obtaining history from patient or surrogate, ordering and performing treatments and interventions, ordering and review of laboratory studies, ordering and review of radiographic studies, pulse oximetry and re-evaluation of patient's condition.   Admit to inpatient to be seen by morning team  Dyrell Tuccillo, MD 08/01/15 [phone]
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
## 2015-08-01 NOTE — ED Notes (Signed)
Pt ambulated to BR and back to room with only standby assist from sitter

## 2015-08-01 NOTE — ED Notes (Addendum)
1 purple sweater Gray pants Blue bra Tan and black jacket [employer] with flowers [employer] with papers, books, gum, ink pens, glasses, 2 glucometers, misc\  [name] medications sent to Pharmacy for storage

## 2015-08-01 NOTE — Progress Notes (Signed)
Psychiatry consultation received by Dr. Susie Cassette for evaluation. TTS to notify psychiatrist/extender at [facility], as patient is now scheduled to be admitted to MedSurg for elevated CBG's.     Carolyn Ayala. MSW, LCSW Therapeutic Triage Services-Triage Specialist   Phone: [phone]

## 2015-08-01 NOTE — Progress Notes (Signed)
Patient requests that "no one can know I'm here."  Nursing secretary notified.

## 2015-08-01 NOTE — Progress Notes (Signed)
Carryover: Ms. Mcneil is a 40 year old female with a past medical history of overdose, diabetes type 1, hypertension, and mood disorder; who presented initially with complaints of chest pain and left arm pain. Lab work revealed elevated blood glucose of 460 with an anion gap of 15. Patient at some point barricaded herself in a bathroom in the ED and came out somnolent. Question the possibility of patient taking benzos although not currently on her medication list. Patient had also recently and torso alcohol use. Due to patient's recent insulin overdose a couple weeks ago. Patient was committed IVC signed by Dr. Nicanor Alcon. Vital signs stable glucose stabilizer protocol initiated. Patient will likely need to see psychiatry prior to discharge.

## 2015-08-01 NOTE — ED Notes (Signed)
Dr Susie Cassette attempted to see assess and talk to patient. Pt sts to hospitalist, "go away and let me sleep." Pt refusing to see hospitaltist

## 2015-08-01 NOTE — ED Notes (Signed)
Bed: WA03 Expected date:  Expected time:  Means of arrival:  Comments: EMS 39yo F anxiety / hyperglycemia

## 2015-08-01 NOTE — ED Notes (Addendum)
I-STAT resulted have not crossed over, writer notifed EDP of result, paper result in chart at bedside.

## 2015-08-01 NOTE — H&P (Addendum)
[facility] History and Physical  Carolyn Ayala ZOX:[id number] DOB: 1975/07/08 DOA: 08/01/2015  Referring physician:   PCP: No primary care provider on file.   Chief Complaint:    HPI:  40 year old female with a history of type 1 diabetes, hypertension, mood disorder, prior suicidal ideation secondary to insulin overdose presents with chest pain and left arm pain. Patient refuses to be examined and wants me to go away. Initial CBG 460, and under 15. Patient decided to lock herself up in the bathroom in the ER today, and when she came out she was somnolent. There is some question as to whether the patient to cut her on sedating medications in the bathroom. She also has a history of alcohol abuse. She was recently admitted for insulin overdose in an attempt to end her life. She was committed under IVC by Dr. Daun Peacock. Patient unable to keep telemetry on herself to be monitored, very noncompliant. She is being admitted to MedSurg for her elevated CBGs. She states that her mother recently passed away, and she wanted to join her mother. She is deemed to be imminent risk to her life if discharged. Psychiatry consultation has been requested      Review of Systems:  Patient refuses to give any answers     Past Medical History  Diagnosis Date  . Mental disorder   . Depression   . Diabetes mellitus without complication (HCC)   . Hypertension      Past Surgical History  Procedure Laterality Date  . Cesarean section        Social History:  reports that she has never smoked. She uses smokeless tobacco. She reports that she drinks alcohol. She reports that she does not use illicit drugs.    No Known Allergies  Family History  Problem Relation Age of Onset  . Depression Mother         Prior to Admission medications   Medication Sig Start Date End Date Taking? Authorizing Provider  acamprosate (CAMPRAL) 333 MG tablet Take 2 tablets (666 mg total) by mouth 3  (three) times daily with meals. 07/09/15   Adonis Brook, NP  ARIPiprazole (ABILIFY) 15 MG tablet Take 1 tablet (15 mg total) by mouth daily. 07/09/15   Adonis Brook, NP  FLUoxetine (PROZAC) 20 MG capsule Take 1 capsule (20 mg total) by mouth daily. 07/09/15   Adonis Brook, NP  gabapentin (NEURONTIN) 300 MG capsule Take 3 capsules (900 mg total) by mouth 4 (four) times daily - after meals and at bedtime. 07/09/15   Adonis Brook, NP  mirtazapine (REMERON) 15 MG tablet Take 1 tablet (15 mg total) by mouth at bedtime. 07/09/15   Adonis Brook, NP     Physical Exam: Filed Vitals:   08/01/15 0402 08/01/15 0418 08/01/15 0605 08/01/15 0718  BP:  121/84  137/83  Pulse:  107 102 96  Temp:  98.4 F (36.9 C) 98.3 F (36.8 C) 98.1 F (36.7 C)  TempSrc:  Oral Oral Axillary  Resp:  SpO2: 100% 94%  96%     Constitutional: Vital signs reviewed. Patient uncooperative for physical exam. Patient is a well-developed and well-nourished in no acute distress .Marland Kitchen Patient refuses to talk and wants to sleep   Rest of the physical exam incomplete secondary to patient refusal    Data Review   Micro Results No results found for this or any previous visit (from the past 240 hour(s)).  Radiology Reports Dg Abd Acute W/chest  07/03/2015  CLINICAL DATA:  Acute onset of vomiting.  Initial encounter. EXAM: DG ABDOMEN ACUTE W/ 1V CHEST COMPARISON:  Chest radiograph performed 04/16/2015 FINDINGS: The lungs are well-aerated and clear. There is no evidence of focal opacification, pleural effusion or pneumothorax. The cardiomediastinal silhouette is borderline enlarged. The visualized bowel gas pattern is unremarkable. Scattered stool and air are seen within the colon; there is no evidence of small bowel dilatation to suggest obstruction. No free intra-abdominal air is identified on the provided decubitus view. No acute osseous abnormalities are seen; the sacroiliac joints are unremarkable in appearance. A  calcified uterine fibroid is seen. IMPRESSION: 1. Unremarkable bowel gas pattern; no free intra-abdominal air seen. Moderate amount of stool noted in the colon. 2. Borderline cardiomegaly.  No acute cardiopulmonary process seen. Electronically Signed   By: Roanna Raider M.D.   On: 07/03/2015 02:08     CBC  Recent Labs Lab 08/01/15 0439  WBC 7.7  HGB 12.2  HCT 38.1  PLT 335  MCV 77.9*  MCH 24.9*  MCHC 32.0  RDW 16.2*  LYMPHSABS 3.0  MONOABS 0.4  EOSABS 0.2  BASOSABS 0.0    Chemistries   Recent Labs Lab 08/01/15 0439  NA 134*  K 4.1  CL 101  CO2 18*  GLUCOSE 460*  BUN 10  CREATININE 0.56  CALCIUM 9.5  AST 25  ALT 34  ALKPHOS 103  BILITOT 0.3   ------------------------------------------------------------------------------------------------------------------ CrCl cannot be calculated (Unknown ideal weight.). ------------------------------------------------------------------------------------------------------------------ No results for input(s): HGBA1C in the last 72 hours. ------------------------------------------------------------------------------------------------------------------ No results for input(s): CHOL, HDL, LDLCALC, TRIG, CHOLHDL, LDLDIRECT in the last 72 hours. ------------------------------------------------------------------------------------------------------------------ No results for input(s): TSH, T4TOTAL, T3FREE, THYROIDAB in the last 72 hours.  Invalid input(s): FREET3 ------------------------------------------------------------------------------------------------------------------ No results for input(s): VITAMINB12, FOLATE, FERRITIN, TIBC, IRON, RETICCTPCT in the last 72 hours.  Coagulation profile No results for input(s): INR, PROTIME in the last 168 hours.  No results for input(s): DDIMER in the last 72 hours.  Cardiac Enzymes No results for input(s): CKMB, TROPONINI, MYOGLOBIN in the last 168 hours.  Invalid input(s):  CK ------------------------------------------------------------------------------------------------------------------ Invalid input(s): POCBNP   CBG:  Recent Labs Lab 08/01/15 0511 08/01/15 0657  GLUCAP 422* 373*       EKG: Independently reviewed.  Shows sinus tachycardia.  Assessment/Plan Principal Problem:   Diabetes type 1, uncontrolled (HCC) Last hemoglobin A1c was 11.6 Restart Lantus and moderate sliding scale  Chest pain Patient would not allow to keep telemetry leads on therefore being admitted to MedSurg Patient's troponins will be cycled, check TSH 2-D echo to rule out wall motion abnormalities Repeat EKG today for comparison    Alcoholism with alcohol dependence (HCC)-start patient on CIWA protocol    Substance induced mood disorder (HCC)-continue home psychiatric medications, psychiatric consultation requested. Patient is on Abilify, Prozac, Neurontin, Remeron Psychiatry consultation has been requested as the patient is under IVC initiated by the ER physician      Code Status Orders      full code    Start     Ordered     Family Communication: bedside Disposition Plan: admit   Total time spent 55 minutes.Greater than 50% of this time was spent in counseling, explanation of diagnosis, planning of further management, and coordination of care  [facility] Pager [phone]  If 7PM-7AM, please contact night-coverage [email or web address] Password [facility] 08/01/2015, 7:49 AM

## 2015-08-02 DIAGNOSIS — R45851 Suicidal ideations: Secondary | ICD-10-CM | POA: Diagnosis not present

## 2015-08-02 DIAGNOSIS — F333 Major depressive disorder, recurrent, severe with psychotic symptoms: Secondary | ICD-10-CM | POA: Diagnosis not present

## 2015-08-02 DIAGNOSIS — F1010 Alcohol abuse, uncomplicated: Secondary | ICD-10-CM

## 2015-08-02 DIAGNOSIS — F10220 Alcohol dependence with intoxication, uncomplicated: Secondary | ICD-10-CM | POA: Diagnosis not present

## 2015-08-02 DIAGNOSIS — E1010 Type 1 diabetes mellitus with ketoacidosis without coma: Secondary | ICD-10-CM | POA: Diagnosis not present

## 2015-08-02 DIAGNOSIS — F1994 Other psychoactive substance use, unspecified with psychoactive substance-induced mood disorder: Secondary | ICD-10-CM | POA: Diagnosis not present

## 2015-08-02 LAB — COMPREHENSIVE METABOLIC PANEL
ALT: 30 U/L (ref 14–54)
AST: 26 U/L (ref 15–41)
Albumin: 3.7 g/dL (ref 3.5–5.0)
Alkaline Phosphatase: 100 U/L (ref 38–126)
Anion gap: 8 (ref 5–15)
BUN: 10 mg/dL (ref 6–20)
CO2: 22 mmol/L (ref 22–32)
Calcium: 8.8 mg/dL — ABNORMAL LOW (ref 8.9–10.3)
Chloride: 102 mmol/L (ref 101–111)
Creatinine, Ser: 0.6 mg/dL (ref 0.44–1.00)
GFR calc Af Amer: >60 mL/min (ref >60)
GFR calc non Af Amer: >60 mL/min (ref >60)
Glucose, Bld: 240 mg/dL — ABNORMAL HIGH (ref 65–99)
Potassium: 4.3 mmol/L (ref 3.5–5.1)
Sodium: 132 mmol/L — ABNORMAL LOW (ref 135–145)
Total Bilirubin: 0.1 mg/dL — ABNORMAL LOW (ref 0.3–1.2)
Total Protein: 7.1 g/dL (ref 6.5–8.1)

## 2015-08-02 LAB — GLUCOSE, CAPILLARY
Glucose-Capillary: 239 mg/dL — ABNORMAL HIGH (ref 65–99)
Glucose-Capillary: 292 mg/dL — ABNORMAL HIGH (ref 65–99)
Glucose-Capillary: 405 mg/dL — ABNORMAL HIGH (ref 65–99)
Glucose-Capillary: 430 mg/dL — ABNORMAL HIGH (ref 65–99)
Glucose-Capillary: 459 mg/dL — ABNORMAL HIGH (ref 65–99)

## 2015-08-02 LAB — CBC
HCT: 36.3 % (ref 36.0–46.0)
Hemoglobin: 11.9 g/dL — ABNORMAL LOW (ref 12.0–15.0)
MCH: 25.2 pg — ABNORMAL LOW (ref 26.0–34.0)
MCHC: 32.8 g/dL (ref 30.0–36.0)
MCV: 76.9 fL — ABNORMAL LOW (ref 78.0–100.0)
Platelets: 300 10*3/uL (ref 150–400)
RBC: 4.72 MIL/uL (ref 3.87–5.11)
RDW: 16.1 % — ABNORMAL HIGH (ref 11.5–15.5)
WBC: 6.6 10*3/uL (ref 4.0–10.5)

## 2015-08-02 LAB — GLUCOSE, RANDOM: Glucose, Bld: 398 mg/dL — ABNORMAL HIGH (ref 65–99)

## 2015-08-02 MED ORDER — INSULIN ASPART 100 UNIT/ML ~~LOC~~ SOLN: 6.0000 [IU] | Freq: Three times a day (TID) | SUBCUTANEOUS | Status: DC

## 2015-08-02 MED ORDER — HYDROXYZINE HCL 50 MG PO TABS
100.0000 mg | ORAL_TABLET | Freq: Four times a day (QID) | ORAL | Status: DC | PRN
  Administered 2015-08-02 – 2015-08-05 (×4): 100 mg via ORAL
  Filled 2015-08-02 (×6): qty 2

## 2015-08-02 MED ORDER — INSULIN GLARGINE 100 UNIT/ML ~~LOC~~ SOLN
40.0000 [IU] | Freq: Two times a day (BID) | SUBCUTANEOUS | Status: DC
  Filled 2015-08-02: qty 0.4

## 2015-08-02 MED ORDER — CYCLOBENZAPRINE HCL 10 MG PO TABS
10.0000 mg | ORAL_TABLET | Freq: Three times a day (TID) | ORAL | Status: DC | PRN
  Administered 2015-08-03 – 2015-08-05 (×5): 10 mg via ORAL
  Filled 2015-08-02 (×5): qty 1

## 2015-08-02 MED ORDER — INSULIN ASPART 100 UNIT/ML ~~LOC~~ SOLN
15.0000 [IU] | Freq: Once | SUBCUTANEOUS | Status: AC
  Administered 2015-08-02: 15 [IU] via SUBCUTANEOUS

## 2015-08-02 MED ORDER — INSULIN ASPART 100 UNIT/ML ~~LOC~~ SOLN
0.0000 [IU] | Freq: Three times a day (TID) | SUBCUTANEOUS | Status: DC
  Administered 2015-08-03: 11 [IU] via SUBCUTANEOUS
  Administered 2015-08-03: 7 [IU] via SUBCUTANEOUS
  Administered 2015-08-03: 20 [IU] via SUBCUTANEOUS
  Administered 2015-08-04: 11 [IU] via SUBCUTANEOUS
  Administered 2015-08-04: 4 [IU] via SUBCUTANEOUS
  Administered 2015-08-04: 20 [IU] via SUBCUTANEOUS
  Administered 2015-08-05: 15 [IU] via SUBCUTANEOUS
  Administered 2015-08-05: 7 [IU] via SUBCUTANEOUS

## 2015-08-02 MED ORDER — FLUOXETINE HCL 40 MG PO CAPS: 40.0000 mg | ORAL_CAPSULE | Freq: Every day | ORAL | Status: DC

## 2015-08-02 MED ORDER — INSULIN ASPART 100 UNIT/ML ~~LOC~~ SOLN
0.0000 [IU] | Freq: Every day | SUBCUTANEOUS | Status: DC
  Administered 2015-08-03 – 2015-08-04 (×2): 3 [IU] via SUBCUTANEOUS

## 2015-08-02 MED ORDER — INSULIN GLARGINE 100 UNIT/ML ~~LOC~~ SOLN
45.0000 [IU] | Freq: Two times a day (BID) | SUBCUTANEOUS | Status: DC
  Administered 2015-08-02: 45 [IU] via SUBCUTANEOUS
  Filled 2015-08-02 (×2): qty 0.45

## 2015-08-02 MED ORDER — INSULIN ASPART 100 UNIT/ML ~~LOC~~ SOLN
5.0000 [IU] | Freq: Once | SUBCUTANEOUS | Status: AC
Start: 2015-08-02 — End: 2015-08-02
  Administered 2015-08-02: 5 [IU] via SUBCUTANEOUS

## 2015-08-02 MED ORDER — INSULIN ASPART 100 UNIT/ML ~~LOC~~ SOLN
22.0000 [IU] | Freq: Once | SUBCUTANEOUS | Status: AC
  Administered 2015-08-02: 22 [IU] via SUBCUTANEOUS

## 2015-08-02 MED ORDER — LORAZEPAM 1 MG PO TABS
2.0000 mg | ORAL_TABLET | Freq: Three times a day (TID) | ORAL | Status: DC
  Administered 2015-08-02 – 2015-08-03 (×5): 2 mg via ORAL
  Filled 2015-08-02 (×6): qty 2

## 2015-08-02 MED ORDER — FLUTICASONE PROPIONATE 50 MCG/ACT NA SUSP
1.0000 | Freq: Every day | NASAL | Status: DC | PRN
  Filled 2015-08-02: qty 16

## 2015-08-02 NOTE — Progress Notes (Signed)
Utilization review completed.  

## 2015-08-02 NOTE — Progress Notes (Signed)
Patient pulled IV out and refuses to have new IV restarted.  MD notified.
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
## 2015-08-02 NOTE — Progress Notes (Signed)
PROGRESS NOTE  Carolyn Ayala XWR:[id number] DOB: 03/20/1976 DOA: 08/01/2015 PCP: No primary care provider on file. Brief History 40 year old female with history of diabetes mellitus type 1, hypertension, substance induced mood disorder with history of suicidal attempt presented with chest pain and left arm pain.  In the emergency department, the patient in the bathroom, and when she came out of the bathroom she was somnolent. There was suspected the patient took some type of illicit substance. The patient was involuntarily committed by the EDP because the patient was noted to be a harm to herself.. In emergency department, the patient was noted to have serum glucose of 460 and anion gap of 15. The patient was started on adjuvant his fluids with improvement of her glucose and anion gap. Assessment/Plan: DKA-DM I -mild -resolved without insulin drip -stable on [place] insulin now -04/19/2015 hemoglobin A1c 11.5 -Recheck A1c -increase Lantus to 45 units bid -start novolog 6 units with meal -change to resistant SSI Delirium -Suspected that she took some type of illicit substance -Urine drug screen only positive for benzodiazepines -Resolved, back to baseline -Psychiatry has been consulted and recommends inpatient psychiatric care once medically stable Atypical chest pain -Troponins negative 3 -Presently chest pain-free -EKG negative for ST-T wave changes Alcohol dependence -Alcohol level 108 at the time of admission -CIWA Substance induced mood disorder  -appreciate psychiatry consult -Continue Abilify, Prozac, Neurontin, Remeron    Family Communication:   Pt at beside Disposition Plan:   PATIENT IS MEDICALLY STABLE TO GO TO BEHAVIORAL HEALTH       Procedures/Studies:  No results found.      Subjective: Patient denies fevers, chills, headache, chest pain, dyspnea, nausea, vomiting, diarrhea, abdominal pain, dysuria, hematuria   Objective: Filed  Vitals:   08/01/15 2010 08/01/15 2130 08/02/15 0539 08/02/15 1619  BP:  123/90 124/96 125/72  Pulse:  108 103 111  Temp:  98.8 F (37.1 C) 97.5 F (36.4 C) 98.6 F (37 C)  TempSrc:  Oral Oral Oral  Resp:  Height:  (1.575 m)     Weight: 90.719 kg (200 lb)     SpO2:  97% 99% 99%   No intake or output data in the 24 hours ending 08/02/15 1726 Weight change:  Exam:   General:  Pt is alert, follows commands appropriately, not in acute distress  HEENT: No icterus, No thrush, No neck mass, [place]/AT  Cardiovascular: RRR, S1/S2, no rubs, no gallops  Respiratory: CTA bilaterally, no wheezing, no crackles, no rhonchi  Abdomen: Soft/+BS, non tender, non distended, no guarding; no hepatosplenomegaly  Extremities: No edema, No lymphangitis, No petechiae, No rashes, no synovitis; no cyanosis or clubbing   Data Reviewed: Basic Metabolic Panel:  Recent Labs Lab 08/01/15 0439 08/01/15 0448 08/01/15 0814 08/02/15 0633 08/02/15 1326  NA 134* 133*  --  132*  --   K 4.1 5.0  --  4.3  --   CL 101 102  --  102  --   CO2 18*  --   --  22  --   GLUCOSE 460* 476*  --  240* 398*  BUN 10 11  --  10  --   CREATININE 0.56 0.60  --  0.60  --   CALCIUM 9.5  --   --  8.8*  --   MG  --   --  1.7  --   --    Liver Function Tests:  Recent Labs Lab 08/01/15 0439 08/02/15 0633  AST 25 26  ALT 34 30  ALKPHOS 103 100  BILITOT 0.3 0.1*  PROT 7.9 7.1  ALBUMIN 4.0 3.7   No results for input(s): LIPASE, AMYLASE in the last 168 hours. No results for input(s): AMMONIA in the last 168 hours. CBC:  Recent Labs Lab 08/01/15 0439 08/01/15 0448 08/02/15 0633  WBC 7.7  --  6.6  NEUTROABS 4.1  --   --   HGB 12.2 14.3 11.9*  HCT 38.1 42.0 36.3  MCV 77.9*  --  76.9*  PLT 335  --  300   Cardiac Enzymes:  Recent Labs Lab 08/01/15 0814 08/01/15 1400 08/01/15 2026  TROPONINI <0.03 <0.03 <0.03   BNP: Invalid input(s): POCBNP CBG:  Recent Labs Lab 08/02/15 0022  08/02/15 0334 08/02/15 0801 08/02/15 1137 08/02/15 1616  GLUCAP 405* 239* 292* 459* 430*    No results found for this or any previous visit (from the past 240 hour(s)).   Scheduled Meds: . ARIPiprazole  15 mg Oral Daily  . enoxaparin (LOVENOX) injection  40 mg Subcutaneous Q24H  . FLUoxetine  40 mg Oral Daily  . folic acid  1 mg Oral Daily  . gabapentin  900 mg Oral TID PC & HS  . insulin aspart  0-20 Units Subcutaneous TID WC  . insulin aspart  0-5 Units Subcutaneous QHS  . [START ON 08/03/2015] insulin aspart  6 Units Subcutaneous TID WC  . insulin glargine  45 Units Subcutaneous BID  . LORazepam  2 mg Oral TID  . mirtazapine  15 mg Oral QHS  . multivitamin with minerals  1 tablet Oral Daily  . sodium chloride flush  3 mL Intravenous Q12H  . thiamine  100 mg Oral Daily   Or  . thiamine  100 mg Intravenous Daily   Continuous Infusions: . sodium chloride 125 mL/hr at 08/02/15 0500     Benz Vandenberghe, DO  [facility] Pager [phone]  If 7PM-7AM, please contact night-coverage [email or web address] Password [email or web address] 08/02/2015, 5:26 PM   LOS: 1 day

## 2015-08-03 DIAGNOSIS — F333 Major depressive disorder, recurrent, severe with psychotic symptoms: Secondary | ICD-10-CM | POA: Diagnosis not present

## 2015-08-03 DIAGNOSIS — E1010 Type 1 diabetes mellitus with ketoacidosis without coma: Secondary | ICD-10-CM | POA: Diagnosis not present

## 2015-08-03 DIAGNOSIS — F1010 Alcohol abuse, uncomplicated: Secondary | ICD-10-CM | POA: Diagnosis not present

## 2015-08-03 DIAGNOSIS — F1994 Other psychoactive substance use, unspecified with psychoactive substance-induced mood disorder: Secondary | ICD-10-CM | POA: Diagnosis not present

## 2015-08-03 LAB — BASIC METABOLIC PANEL
Anion gap: 10 (ref 5–15)
BUN: 12 mg/dL (ref 6–20)
CO2: 20 mmol/L — ABNORMAL LOW (ref 22–32)
Calcium: 9.5 mg/dL (ref 8.9–10.3)
Chloride: 101 mmol/L (ref 101–111)
Creatinine, Ser: 0.76 mg/dL (ref 0.44–1.00)
GFR calc Af Amer: >60 mL/min (ref >60)
GFR calc non Af Amer: >60 mL/min (ref >60)
Glucose, Bld: 400 mg/dL — ABNORMAL HIGH (ref 65–99)
Potassium: 4.5 mmol/L (ref 3.5–5.1)
Sodium: 131 mmol/L — ABNORMAL LOW (ref 135–145)

## 2015-08-03 LAB — MAGNESIUM: Magnesium: 1.8 mg/dL (ref 1.7–2.4)

## 2015-08-03 LAB — GLUCOSE, CAPILLARY
Glucose-Capillary: 375 mg/dL — ABNORMAL HIGH (ref 65–99)
Glucose-Capillary: 378 mg/dL — ABNORMAL HIGH (ref 65–99)
Glucose-Capillary: 411 mg/dL — ABNORMAL HIGH (ref 65–99)
Glucose-Capillary: 482 mg/dL — ABNORMAL HIGH (ref 65–99)
Glucose-Capillary: 485 mg/dL — ABNORMAL HIGH (ref 65–99)
Glucose-Capillary: 213 mg/dL — ABNORMAL HIGH (ref 65–99)
Glucose-Capillary: 267 mg/dL — ABNORMAL HIGH (ref 65–99)

## 2015-08-03 LAB — HEMOGLOBIN A1C
Hgb A1c MFr Bld: 11.5 % — ABNORMAL HIGH (ref 4.8–5.6)
Mean Plasma Glucose: 283 mg/dL

## 2015-08-03 MED ORDER — INSULIN ASPART 100 UNIT/ML ~~LOC~~ SOLN
8.0000 [IU] | Freq: Once | SUBCUTANEOUS | Status: AC
  Administered 2015-08-03: 8 [IU] via SUBCUTANEOUS

## 2015-08-03 MED ORDER — INSULIN GLARGINE 100 UNIT/ML ~~LOC~~ SOLN
50.0000 [IU] | Freq: Two times a day (BID) | SUBCUTANEOUS | Status: DC
  Administered 2015-08-03 (×2): 50 [IU] via SUBCUTANEOUS
  Filled 2015-08-03 (×3): qty 0.5

## 2015-08-03 MED ORDER — INSULIN ASPART 100 UNIT/ML ~~LOC~~ SOLN: 8.0000 [IU] | Freq: Three times a day (TID) | SUBCUTANEOUS | Status: DC

## 2015-08-03 MED ORDER — SODIUM CHLORIDE 0.9 % IV BOLUS (SEPSIS)
1000.0000 mL | Freq: Once | INTRAVENOUS | Status: AC
  Administered 2015-08-03: 1000 mL via INTRAVENOUS

## 2015-08-03 MED ORDER — INSULIN ASPART 100 UNIT/ML ~~LOC~~ SOLN
10.0000 [IU] | Freq: Three times a day (TID) | SUBCUTANEOUS | Status: DC
  Administered 2015-08-03 (×3): 10 [IU] via SUBCUTANEOUS

## 2015-08-03 NOTE — Progress Notes (Signed)
PROGRESS NOTE  Carolyn Ayala ZOX:[id number] DOB: [birth year] DOA: 08/01/2015 PCP: No primary care provider on file.  Brief History 40 year old female with history of diabetes mellitus type 1, hypertension, substance induced mood disorder with history of suicidal attempt presented with chest pain and left arm pain. In the emergency department, the patient in the bathroom, and when she came out of the bathroom she was somnolent. There was suspected the patient took some type of illicit substance. The patient was involuntarily committed by the EDP because the patient was noted to be a harm to herself.. In emergency department, the patient was noted to have serum glucose of 460 and anion gap of 15. The patient was started on adjuvant his fluids with improvement of her glucose and anion gap. Assessment/Plan: DKA-DM I -mild -resolved without insulin drip -stable on [place] insulin now -04/19/2015 hemoglobin A1c 11.5 -Recheck A1c--pending -increase Lantus to 50 units bid -start novolog 10 units with meal in addition to SSI -change to resistant SSI Delirium -Suspected that she took some type of illicit substance -Urine drug screen only positive for benzodiazepines -Resolved, back to baseline -Psychiatry has been consulted and recommends inpatient psychiatric care once medically stable Atypical chest pain -Troponins negative 3 -Presently chest pain-free -EKG negative for ST-T wave changes Alcohol dependence -Alcohol level 108 at the time of admission -CIWA Substance induced mood disorder  -appreciate psychiatry consult -Continue Abilify, Prozac, Neurontin, Remeron    Family Communication: No family present Disposition Plan: PATIENT IS MEDICALLY STABLE TO GO TO BEHAVIORAL HEALTH if CBG <400 with lunch    Procedures/Studies:  No results found.      Subjective: Pt c/o some nausea without emesis.  NO cp, sob, abd pain.  No f/c  Objective: Filed Vitals:   08/02/15 1619 08/02/15 2015 08/03/15 0048 08/03/15 0420  BP:   134/91 125/73  Pulse: 111 103    Temp: 98.6 F (37 C) 98.2 F (36.8 C) 98.4 F (36.9 C) 98.2 F (36.8 C)  TempSrc: Oral Oral Oral Oral  Resp: Height:      Weight:      SpO2: 99% 100% 99% 100%   No intake or output data in the 24 hours ending 08/03/15 0734 Weight change:  Exam:   General:  Pt is alert, follows commands appropriately, not in acute distress  HEENT: No icterus, No thrush, No neck mass, [place]/AT  Cardiovascular: RRR, S1/S2, no rubs, no gallops  Respiratory: CTA bilaterally, no wheezing, no crackles, no rhonchi  Abdomen: Soft/+BS, non tender, non distended, no guarding  Extremities: No edema, No lymphangitis, No petechiae, No rashes, no synovitis  Data Reviewed: Basic Metabolic Panel:  Recent Labs Lab 08/01/15 0439 08/01/15 0448 08/01/15 0814 08/02/15 0633 08/02/15 1326 08/03/15 0558  NA 134* 133*  --  132*  --  131*  K 4.1 5.0  --  4.3  --  4.5  CL 101 102  --  102  --  101  CO2 18*  --   --  22  --  20*  GLUCOSE 460* 476*  --  240* 398* 400*  BUN 10 11  --  10  --  12  CREATININE 0.56 0.60  --  0.60  --  0.76  CALCIUM 9.5  --   --  8.8*  --  9.5  MG  --   --  1.7  --   --  1.8   Liver Function  Tests:  Recent Labs Lab 08/01/15 0439 08/02/15 0633  AST 25 26  ALT 34 30  ALKPHOS 103 100  BILITOT 0.3 0.1*  PROT 7.9 7.1  ALBUMIN 4.0 3.7   No results for input(s): LIPASE, AMYLASE in the last 168 hours. No results for input(s): AMMONIA in the last 168 hours. CBC:  Recent Labs Lab 08/01/15 0439 08/01/15 0448 08/02/15 0633  WBC 7.7  --  6.6  NEUTROABS 4.1  --   --   HGB 12.2 14.3 11.9*  HCT 38.1 42.0 36.3  MCV 77.9*  --  76.9*  PLT 335  --  300   Cardiac Enzymes:  Recent Labs Lab 08/01/15 0814 08/01/15 1400 08/01/15 2026  TROPONINI <0.03 <0.03 <0.03   BNP: Invalid input(s): POCBNP CBG:  Recent Labs Lab 08/02/15 0022 08/02/15 0334 08/02/15 0801  08/02/15 1137 08/02/15 1616  GLUCAP 405* 239* 292* 459* 430*    No results found for this or any previous visit (from the past 240 hour(s)).   Scheduled Meds: . ARIPiprazole  15 mg Oral Daily  . enoxaparin (LOVENOX) injection  40 mg Subcutaneous Q24H  . FLUoxetine  40 mg Oral Daily  . folic acid  1 mg Oral Daily  . gabapentin  900 mg Oral TID PC & HS  . insulin aspart  0-20 Units Subcutaneous TID WC  . insulin aspart  0-5 Units Subcutaneous QHS  . insulin aspart  8 Units Subcutaneous TID WC  . insulin glargine  50 Units Subcutaneous BID  . LORazepam  2 mg Oral TID  . mirtazapine  15 mg Oral QHS  . multivitamin with minerals  1 tablet Oral Daily  . sodium chloride  1,000 mL Intravenous Once  . sodium chloride flush  3 mL Intravenous Q12H  . thiamine  100 mg Oral Daily   Or  . thiamine  100 mg Intravenous Daily   Continuous Infusions: . sodium chloride 125 mL/hr at 08/02/15 0500     Raekwan Spelman, DO  [facility] Pager [phone]  If 7PM-7AM, please contact night-coverage [email or web address] Password [email or web address] 08/03/2015, 7:34 AM   LOS: 2 days

## 2015-08-03 NOTE — Progress Notes (Signed)
Pt refusing tele. MD notified

## 2015-08-03 NOTE — Progress Notes (Signed)
Patient was seen and evaluated upon request from Dr. Arlana Pouch, she still expresses active suicidal thoughts and ideation, IVC papers were filled and signed. Huey Bienenstock MD

## 2015-08-03 NOTE — Progress Notes (Signed)
Inpatient Diabetes Program Recommendations  AACE/ADA: New Consensus Statement on Inpatient Glycemic Control (2015)  Target Ranges:  Prepandial:   less than 140 mg/dL      Peak postprandial:   less than 180 mg/dL (1-2 hours)      Critically ill patients:  140 - 180 mg/dL   Review of Glycemic Control  Results for Carolyn Ayala, Carolyn Ayala (MRN [id number]) as of 08/03/2015 15:52  Ref. Range 08/02/2015 16:16 08/02/2015 20:46 08/03/2015 00:14 08/03/2015 04:24 08/03/2015 07:38  Glucose-Capillary Latest Ref Range: 65-99 mg/dL 409 (H) 811 (H) 914 (H) 411 (H) 482 (H)  Results for Carolyn Ayala, Carolyn Ayala (MRN [id number]) as of 08/03/2015 15:52  Ref. Range 08/01/2015 08:14  Hemoglobin A1C Latest Ref Range: 4.8-5.6 % 11.5 (H)   Uncontrolled DM2. Lantus increased to 50 units bid Novolog increased to 10 units tidwc.  Inpatient Diabetes Program Recommendations:    Will likely need 15 - 20 units of Novolog with meals. Continue to titrate.  Will follow. Thank you. Ailene Ards, RD, LDN, CDE Inpatient Diabetes Coordinator [phone]

## 2015-08-03 NOTE — Progress Notes (Addendum)
CSW assisting with d/c planning. IVC papers sent to magistrate. Pt needs to be served prior to d/c. CSW will assist with d/c planning to in pt psych hospital.   Cori Razor LCSW [phone]  14:22 Pt has been referred to [facility] for psych placement.  Cori Razor LCSW [phone]

## 2015-08-04 DIAGNOSIS — F10220 Alcohol dependence with intoxication, uncomplicated: Secondary | ICD-10-CM | POA: Diagnosis not present

## 2015-08-04 DIAGNOSIS — F333 Major depressive disorder, recurrent, severe with psychotic symptoms: Secondary | ICD-10-CM | POA: Diagnosis not present

## 2015-08-04 DIAGNOSIS — E1010 Type 1 diabetes mellitus with ketoacidosis without coma: Secondary | ICD-10-CM | POA: Diagnosis not present

## 2015-08-04 DIAGNOSIS — R45851 Suicidal ideations: Secondary | ICD-10-CM | POA: Diagnosis not present

## 2015-08-04 DIAGNOSIS — F1994 Other psychoactive substance use, unspecified with psychoactive substance-induced mood disorder: Secondary | ICD-10-CM | POA: Diagnosis not present

## 2015-08-04 LAB — BASIC METABOLIC PANEL
Anion gap: 8 (ref 5–15)
BUN: 11 mg/dL (ref 6–20)
CO2: 22 mmol/L (ref 22–32)
Calcium: 8.8 mg/dL — ABNORMAL LOW (ref 8.9–10.3)
Chloride: 102 mmol/L (ref 101–111)
Creatinine, Ser: 0.83 mg/dL (ref 0.44–1.00)
GFR calc Af Amer: >60 mL/min (ref >60)
GFR calc non Af Amer: >60 mL/min (ref >60)
Glucose, Bld: 447 mg/dL — ABNORMAL HIGH (ref 65–99)
Potassium: 4.3 mmol/L (ref 3.5–5.1)
Sodium: 132 mmol/L — ABNORMAL LOW (ref 135–145)

## 2015-08-04 LAB — GLUCOSE, CAPILLARY
Glucose-Capillary: 195 mg/dL — ABNORMAL HIGH (ref 65–99)
Glucose-Capillary: 269 mg/dL — ABNORMAL HIGH (ref 65–99)
Glucose-Capillary: 286 mg/dL — ABNORMAL HIGH (ref 65–99)
Glucose-Capillary: 473 mg/dL — ABNORMAL HIGH (ref 65–99)
Glucose-Capillary: 285 mg/dL — ABNORMAL HIGH (ref 65–99)
Glucose-Capillary: 172 mg/dL — ABNORMAL HIGH (ref 65–99)
Glucose-Capillary: 71 mg/dL (ref 65–99)
Glucose-Capillary: 285 mg/dL — ABNORMAL HIGH (ref 65–99)

## 2015-08-04 LAB — URINALYSIS, ROUTINE W REFLEX MICROSCOPIC
Bilirubin Urine: NEGATIVE
Glucose, UA: 250 mg/dL — AB
Hgb urine dipstick: NEGATIVE
Ketones, ur: NEGATIVE mg/dL
Leukocytes, UA: NEGATIVE
Nitrite: NEGATIVE
Protein, ur: NEGATIVE mg/dL
Specific Gravity, Urine: 1.013 (ref 1.005–1.030)
pH: 6.5 (ref 5.0–8.0)

## 2015-08-04 LAB — HEMOGLOBIN A1C
Hgb A1c MFr Bld: 11.4 % — ABNORMAL HIGH (ref 4.8–5.6)
Mean Plasma Glucose: 280 mg/dL

## 2015-08-04 MED ORDER — INSULIN GLARGINE 100 UNIT/ML ~~LOC~~ SOLN
55.0000 [IU] | Freq: Two times a day (BID) | SUBCUTANEOUS | Status: DC
  Administered 2015-08-04 – 2015-08-05 (×3): 55 [IU] via SUBCUTANEOUS
  Filled 2015-08-04 (×4): qty 0.55

## 2015-08-04 MED ORDER — INSULIN GLARGINE 100 UNIT/ML ~~LOC~~ SOLN: 55.0000 [IU] | Freq: Two times a day (BID) | SUBCUTANEOUS | Status: DC

## 2015-08-04 MED ORDER — LORAZEPAM 1 MG PO TABS: 1.0000 mg | ORAL_TABLET | Freq: Three times a day (TID) | ORAL | Status: DC

## 2015-08-04 MED ORDER — INSULIN ASPART 100 UNIT/ML ~~LOC~~ SOLN: 15.0000 [IU] | Freq: Three times a day (TID) | SUBCUTANEOUS | Status: DC

## 2015-08-04 MED ORDER — LORAZEPAM 1 MG PO TABS
1.0000 mg | ORAL_TABLET | Freq: Three times a day (TID) | ORAL | Status: DC
  Administered 2015-08-04 – 2015-08-05 (×5): 1 mg via ORAL
  Filled 2015-08-04 (×6): qty 1

## 2015-08-04 MED ORDER — SODIUM CHLORIDE 0.9 % IV BOLUS (SEPSIS)
1000.0000 mL | Freq: Once | INTRAVENOUS | Status: AC
  Administered 2015-08-04: 1000 mL via INTRAVENOUS

## 2015-08-04 MED ORDER — INSULIN ASPART 100 UNIT/ML ~~LOC~~ SOLN
15.0000 [IU] | Freq: Three times a day (TID) | SUBCUTANEOUS | Status: DC
  Administered 2015-08-04 – 2015-08-05 (×5): 15 [IU] via SUBCUTANEOUS

## 2015-08-04 NOTE — Progress Notes (Addendum)
CSW contacted the [facility] and [facility] Fermo confirmed that there are not acute beds at this time.   CSW will continue to follow and search for [facility] placement.   Branda Chaudhary Freida Busman [employer]  [phone]

## 2015-08-04 NOTE — Discharge Summary (Signed)
Physician Discharge Summary  Carolyn Ayala ZOX:[id number] DOB: 1975/07/03 DOA: 08/01/2015  PCP: No primary care provider on file.  Admit date: 08/01/2015 Discharge date: 08/04/2015  Recommendations for Outpatient Follow-up:  1. Pt will need to follow up with PCP in 2 weeks post discharge 2. Please obtain BMP in one week  Discharge Diagnoses:  DKA-DM I -mild -resolved without insulin drip -stable on [place] insulin now -04/19/2015 hemoglobin A1c 11.5 -08/03/15 Recheck A1c--11.4 -increase Lantus to 55 units bid -Increased novolog 15 units with meals in addition to SSI -change to resistant SSI -during hospitalization, pt was eating a meal around 3AM each day causing spikes in am CBGs Delirium -Suspected that she took some type of illicit substance -Urine drug screen only positive for benzodiazepines -Resolved, back to baseline -Psychiatry has been consulted and recommends inpatient psychiatric care once medically stable Atypical chest pain -Troponins negative 3 -Presently chest pain-free -EKG negative for ST-T wave changes -did not recur during the admission Alcohol dependence -Alcohol level 108 at the time of admission -CIWA Substance induced mood disorder  -appreciate psychiatry consult -Continue Abilify, Prozac, Neurontin, Remeron -unclear dose of Ativan that she was taking prior to hospitalization -Ativan dose decreased to 1 mg tid (previously )  Discharge Condition: stable  Disposition: [facility]  Diet:carb modified Wt Readings from Last 3 Encounters:  08/01/15 90.719 kg (200 lb)  07/03/15 89.359 kg (197 lb)  04/19/15 77.565 kg (171 lb)    History of present illness:  40 year old female with history of diabetes mellitus type 1, hypertension, substance induced mood disorder with history of suicidal attempt presented with chest pain and left arm pain. In the emergency department, the patient in the bathroom, and when she came out of the bathroom she was  somnolent. There was suspected the patient took some type of illicit substance. The patient was involuntarily committed by the EDP because the patient was noted to be a harm to herself.. In emergency department, the patient was noted to have serum glucose of 460 and anion gap of 15. The patient was started on adjuvant his fluids with improvement of her glucose and anion gap. During her stay, her insulin regimen was adjusted with improvement of her glycemic control. Consultants: psychiatry  Discharge Exam: Filed Vitals:   08/03/15 2036 08/04/15 0417  BP: 122/82 126/89  Pulse: 98 111  Temp: 98.6 F (37 C) 98.1 F (36.7 C)  Resp: 18 18   Filed Vitals:   08/03/15 0420 08/03/15 1400 08/03/15 2036 08/04/15 0417  BP: 125/73 125/74 122/82 126/89  Pulse:   98 111  Temp: 98.2 F (36.8 C) 98.7 F (37.1 C) 98.6 F (37 C) 98.1 F (36.7 C)  TempSrc: Oral Oral Oral Oral  Resp: Height:      Weight:      SpO2: 100% 99% 100% 100%   General: A&O x 3, NAD, pleasant, cooperative Cardiovascular: RRR, no rub, no gallop, no S3 Respiratory: CTAB, no wheeze, no rhonchi Abdomen:soft, nontender, nondistended, positive bowel sounds Extremities: No edema, No lymphangitis, no petechiae  Discharge Instructions      Discharge Instructions    Increase activity slowly    Complete by:  As directed             Medication List    STOP taking these medications        acamprosate 333 MG tablet  Commonly known as:  CAMPRAL     cyclobenzaprine 10 MG tablet  Commonly known as:  FLEXERIL  hydrOXYzine 100 MG capsule  Commonly known as:  VISTARIL     ibuprofen 800 MG tablet  Commonly known as:  ADVIL,MOTRIN     insulin regular 100 units/mL injection  Commonly known as:  NOVOLIN R,HUMULIN R     LIPITOR PO     lisinopril 10 MG tablet  Commonly known as:  PRINIVIL,ZESTRIL     traMADol 50 MG tablet  Commonly known as:  ULTRAM      TAKE these medications        ARIPiprazole  15 MG tablet  Commonly known as:  ABILIFY  Take 1 tablet (15 mg total) by mouth daily.     capsaicin 0.025 % cream  Commonly known as:  ZOSTRIX  Apply 1 application topically 2 (two) times daily as needed (burning).     diclofenac sodium 1 % Gel  Commonly known as:  VOLTAREN  Apply 2 g topically 2 (two) times daily as needed (pain).     eucerin cream  Apply 1 application topically 2 (two) times daily as needed for dry skin.     FLUoxetine 40 MG capsule  Commonly known as:  PROZAC  Take 40 mg by mouth daily.     fluticasone 50 MCG/ACT nasal spray  Commonly known as:  FLONASE  Place 1 spray into both nostrils daily as needed for allergies or rhinitis.     gabapentin 300 MG capsule  Commonly known as:  NEURONTIN  Take 3 capsules (900 mg total) by mouth 4 (four) times daily - after meals and at bedtime.     insulin aspart 100 UNIT/ML injection  Commonly known as:  novoLOG  Inject 15 Units into the skin 3 (three) times daily with meals.     insulin glargine 100 UNIT/ML injection  Commonly known as:  LANTUS  Inject 0.55 mLs (55 Units total) into the skin 2 (two) times daily.     LORazepam 1 MG tablet  Commonly known as:  ATIVAN  Take 1 tablet (1 mg total) by mouth 3 (three) times daily.     Melatonin 5 MG Tabs  Take 5 mg by mouth at bedtime as needed (sleep).     metFORMIN 1000 MG tablet  Commonly known as:  GLUCOPHAGE  Take 1,000 mg by mouth 2 (two) times daily with a meal.     mirtazapine 15 MG tablet  Commonly known as:  REMERON  Take 1 tablet (15 mg total) by mouth at bedtime.     multivitamin with minerals tablet  Take 1 tablet by mouth daily.     VITAMIN B-1 PO  Take 1 tablet by mouth 2 (two) times daily.         The results of significant diagnostics from this hospitalization (including imaging, microbiology, ancillary and laboratory) are listed below for reference.    Significant Diagnostic Studies: No results found.   Microbiology: No results  found for this or any previous visit (from the past 240 hour(s)).   Labs: Basic Metabolic Panel:  Recent Labs Lab 08/01/15 0439 08/01/15 0448 08/01/15 1610 08/02/15 9604 08/02/15 1326 08/03/15 0558 08/04/15 0603  NA 134* 133*  --  132*  --  131* 132*  K 4.1 5.0  --  4.3  --  4.5 4.3  CL 101 102  --  102  --  101 102  CO2 18*  --   --  22  --  20* 22  GLUCOSE 460* 476*  --  240* 398* 400* 447*  BUN 10  11  --  10  --  12 11  CREATININE 0.56 0.60  --  0.60  --  0.76 0.83  CALCIUM 9.5  --   --  8.8*  --  9.5 8.8*  MG  --   --  1.7  --   --  1.8  --    Liver Function Tests:  Recent Labs Lab 08/01/15 0439 08/02/15 0633  AST 25 26  ALT 34 30  ALKPHOS 103 100  BILITOT 0.3 0.1*  PROT 7.9 7.1  ALBUMIN 4.0 3.7   No results for input(s): LIPASE, AMYLASE in the last 168 hours. No results for input(s): AMMONIA in the last 168 hours. CBC:  Recent Labs Lab 08/01/15 0439 08/01/15 0448 08/02/15 0633  WBC 7.7  --  6.6  NEUTROABS 4.1  --   --   HGB 12.2 14.3 11.9*  HCT 38.1 42.0 36.3  MCV 77.9*  --  76.9*  PLT 335  --  300   Cardiac Enzymes:  Recent Labs Lab 08/01/15 0814 08/01/15 1400 08/01/15 2026  TROPONINI <0.03 <0.03 <0.03   BNP: Invalid input(s): POCBNP CBG:  Recent Labs Lab 08/03/15 0014 08/03/15 0424 08/03/15 0738 08/03/15 1204 08/03/15 1608  GLUCAP 375* 411* 482* 267* 213*    Time coordinating discharge:  Greater than 30 minutes  Signed:  Annalysia Willenbring, DO [facility] Pager: [phone] 08/04/2015, 7:24 AM
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
## 2015-08-05 ENCOUNTER — Encounter (HOSPITAL_COMMUNITY): Payer: Self-pay

## 2015-08-05 ENCOUNTER — Inpatient Hospital Stay (HOSPITAL_COMMUNITY)
Admission: AD | Admit: 2015-08-05 | Discharge: 2015-08-17 | DRG: 885 | Disposition: A | Discharge status: Home / Self Care | Payer: Federal, State, Local not specified - Other | Source: Intra-hospital | Attending: Psychiatry | Admitting: Psychiatry

## 2015-08-05 DIAGNOSIS — F1020 Alcohol dependence, uncomplicated: Secondary | ICD-10-CM

## 2015-08-05 DIAGNOSIS — F10230 Alcohol dependence with withdrawal, uncomplicated: Secondary | ICD-10-CM | POA: Diagnosis not present

## 2015-08-05 DIAGNOSIS — R45851 Suicidal ideations: Secondary | ICD-10-CM | POA: Diagnosis present

## 2015-08-05 DIAGNOSIS — F411 Generalized anxiety disorder: Secondary | ICD-10-CM

## 2015-08-05 DIAGNOSIS — F10229 Alcohol dependence with intoxication, unspecified: Secondary | ICD-10-CM | POA: Diagnosis present

## 2015-08-05 DIAGNOSIS — E119 Type 2 diabetes mellitus without complications: Secondary | ICD-10-CM | POA: Insufficient documentation

## 2015-08-05 DIAGNOSIS — I10 Essential (primary) hypertension: Secondary | ICD-10-CM | POA: Diagnosis present

## 2015-08-05 DIAGNOSIS — F333 Major depressive disorder, recurrent, severe with psychotic symptoms: Principal | ICD-10-CM | POA: Diagnosis present

## 2015-08-05 DIAGNOSIS — Z794 Long term (current) use of insulin: Secondary | ICD-10-CM

## 2015-08-05 DIAGNOSIS — Z634 Disappearance and death of family member: Secondary | ICD-10-CM | POA: Diagnosis not present

## 2015-08-05 DIAGNOSIS — E7800 Pure hypercholesterolemia, unspecified: Secondary | ICD-10-CM | POA: Diagnosis present

## 2015-08-05 DIAGNOSIS — F1994 Other psychoactive substance use, unspecified with psychoactive substance-induced mood disorder: Secondary | ICD-10-CM

## 2015-08-05 DIAGNOSIS — J302 Other seasonal allergic rhinitis: Secondary | ICD-10-CM | POA: Diagnosis present

## 2015-08-05 DIAGNOSIS — E1040 Type 1 diabetes mellitus with diabetic neuropathy, unspecified: Secondary | ICD-10-CM | POA: Diagnosis present

## 2015-08-05 DIAGNOSIS — E785 Hyperlipidemia, unspecified: Secondary | ICD-10-CM | POA: Clinically undetermined

## 2015-08-05 DIAGNOSIS — M792 Neuralgia and neuritis, unspecified: Secondary | ICD-10-CM | POA: Diagnosis present

## 2015-08-05 DIAGNOSIS — IMO0001 Reserved for inherently not codable concepts without codable children: Secondary | ICD-10-CM

## 2015-08-05 LAB — GLUCOSE, CAPILLARY
Glucose-Capillary: 244 mg/dL — ABNORMAL HIGH (ref 65–99)
Glucose-Capillary: 325 mg/dL — ABNORMAL HIGH (ref 65–99)
Glucose-Capillary: 338 mg/dL — ABNORMAL HIGH (ref 65–99)

## 2015-08-05 MED ORDER — ADULT MULTIVITAMIN W/MINERALS CH
1.0000 | ORAL_TABLET | Freq: Every day | ORAL | Status: DC
  Administered 2015-08-06: 1 via ORAL
  Filled 2015-08-05 (×3): qty 1

## 2015-08-05 MED ORDER — METFORMIN HCL 500 MG PO TABS
1000.0000 mg | ORAL_TABLET | Freq: Two times a day (BID) | ORAL | Status: DC
  Administered 2015-08-06 – 2015-08-17 (×23): 1000 mg via ORAL
  Filled 2015-08-05 (×25): qty 2

## 2015-08-05 MED ORDER — INSULIN ASPART 100 UNIT/ML ~~LOC~~ SOLN
15.0000 [IU] | Freq: Three times a day (TID) | SUBCUTANEOUS | Status: DC
  Administered 2015-08-06 – 2015-08-07 (×5): 15 [IU] via SUBCUTANEOUS

## 2015-08-05 MED ORDER — FLUTICASONE PROPIONATE 50 MCG/ACT NA SUSP: 1.0000 | Freq: Every day | NASAL | Status: DC | PRN

## 2015-08-05 MED ORDER — MIRTAZAPINE 15 MG PO TABS
15.0000 mg | ORAL_TABLET | Freq: Every day | ORAL | Status: DC
  Administered 2015-08-05: 15 mg via ORAL
  Filled 2015-08-05 (×5): qty 1

## 2015-08-05 MED ORDER — ACETAMINOPHEN 325 MG PO TABS
650.0000 mg | ORAL_TABLET | Freq: Four times a day (QID) | ORAL | Status: DC | PRN
  Administered 2015-08-05 – 2015-08-11 (×4): 650 mg via ORAL
  Filled 2015-08-05 (×3): qty 2

## 2015-08-05 MED ORDER — INSULIN GLARGINE 100 UNIT/ML ~~LOC~~ SOLN
55.0000 [IU] | Freq: Two times a day (BID) | SUBCUTANEOUS | Status: DC
  Administered 2015-08-05: 30 [IU] via SUBCUTANEOUS
  Administered 2015-08-06: 55 [IU] via SUBCUTANEOUS

## 2015-08-05 MED ORDER — ALUM & MAG HYDROXIDE-SIMETH 200-200-20 MG/5ML PO SUSP: 30.0000 mL | ORAL | Status: DC | PRN

## 2015-08-05 MED ORDER — LORAZEPAM 1 MG PO TABS
1.0000 mg | ORAL_TABLET | Freq: Once | ORAL | Status: AC
  Administered 2015-08-05: 1 mg via ORAL

## 2015-08-05 MED ORDER — ARIPIPRAZOLE 15 MG PO TABS
15.0000 mg | ORAL_TABLET | Freq: Every day | ORAL | Status: DC
  Administered 2015-08-05 – 2015-08-07 (×3): 15 mg via ORAL
  Filled 2015-08-05 (×5): qty 1

## 2015-08-05 MED ORDER — MELATONIN 5 MG PO TABS: 5.0000 mg | ORAL_TABLET | Freq: Every evening | ORAL | Status: DC | PRN

## 2015-08-05 MED ORDER — ADULT MULTIVITAMIN W/MINERALS CH
1.0000 | ORAL_TABLET | Freq: Every day | ORAL | Status: DC
  Filled 2015-08-05 (×2): qty 1

## 2015-08-05 MED ORDER — MAGNESIUM HYDROXIDE 400 MG/5ML PO SUSP: 30.0000 mL | Freq: Every day | ORAL | Status: DC | PRN

## 2015-08-05 MED ORDER — GABAPENTIN 300 MG PO CAPS
900.0000 mg | ORAL_CAPSULE | Freq: Three times a day (TID) | ORAL | Status: DC
  Administered 2015-08-05 – 2015-08-11 (×23): 900 mg via ORAL
  Filled 2015-08-05 (×35): qty 3

## 2015-08-05 MED ORDER — HYDROXYZINE HCL 25 MG PO TABS
25.0000 mg | ORAL_TABLET | Freq: Four times a day (QID) | ORAL | Status: DC | PRN
  Administered 2015-08-05 – 2015-08-15 (×23): 25 mg via ORAL
  Filled 2015-08-05 (×22): qty 1

## 2015-08-05 NOTE — Tx Team (Addendum)
Initial Interdisciplinary Treatment Plan   PATIENT STRESSORS: Financial difficulties Health problems Medication change or noncompliance Occupational concerns Substance abuse   PATIENT STRENGTHS: Wellsite geologist fund of knowledge Religious Affiliation   PROBLEM LIST: Problem List/Patient Goals Date to be addressed Date deferred Reason deferred Estimated date of resolution  Anxiety  08/05/15     Substance abuse 08/05/15     " Get my medications right." 08/05/15     " Learn coping skills." 08/05/15     Risk for suicide 08/15/2015     depression 08/15/2015                        DISCHARGE CRITERIA:  Ability to meet basic life and health needs Adequate post-discharge living arrangements Improved stabilization in mood, thinking, and/or behavior Medical problems require only outpatient monitoring  PRELIMINARY DISCHARGE PLAN: Attend aftercare/continuing care group Attend PHP/IOP Attend 12-step recovery group Outpatient therapy  PATIENT/FAMIILY INVOLVEMENT: This treatment plan has been presented to and reviewed with the patient, Carolyn Ayala, and/or family member.  The patient and family have been given the opportunity to ask questions and make suggestions.  Bethann Punches 08/05/2015, 6:16 PM

## 2015-08-05 NOTE — Progress Notes (Signed)
Inpatient Diabetes Program Recommendations  AACE/ADA: New Consensus Statement on Inpatient Glycemic Control (2015)  Target Ranges:  Prepandial:   less than 140 mg/dL      Peak postprandial:   less than 180 mg/dL (1-2 hours)      Critically ill patients:  140 - 180 mg/dL   Review of Glycemic Control  Results for AZIYAH, PROVENCAL (MRN [id number]) as of 08/05/2015 18:09  Ref. Range 08/04/2015 04:13 08/04/2015 07:47 08/04/2015 12:25 08/04/2015 16:25 08/04/2015 18:38 08/04/2015 20:54 08/05/2015 07:37 08/05/2015 11:41  Glucose-Capillary Latest Ref Range: 65-99 mg/dL 409 (H) 811 (H) 914 (H) 172 (H) 71 285 (H) 244 (H) 325 (H)   Very labile blood sugars. Eats at 3 am per MD notes.  Inpatient Diabetes Program Recommendations:  (orders from [facility] on 08/05/2015) Lantus 55 units bid Novolog 15 units tidwc for meal coverage insulin Novolog resistant tidwc and hs Metformin 1000 mg bid  Will follow daily. Thank you. Ailene Ards, RD, LDN, CDE Inpatient Diabetes Coordinator [phone]

## 2015-08-05 NOTE — Progress Notes (Addendum)
CSW contacted [facility] to assess for placement.   [facility] can accept Pt today. Pt will be going to room 302-1.  CSW provided nurse with information, as well as, MD.   CSW following for d/c to [facility] today.   Leron Croak [facility] [phone]

## 2015-08-05 NOTE — Progress Notes (Signed)
This is a 40 yr female who went to the Ed with complained of increased depression, anxiety, alcohol intoxication, and suicidal ideation with the plan to run in the front of vehicle on the street traffic. Pt stated her mother past 2 weeks, separated with the boy friend and been drinking a lot to calm herself. Pt also has a history of diabetes with CBG reading 460 mg/dl at arrival in the ED. Pt has been calm and cooperative during admission, alert and oriented x 4, denies SI/HI. Consents signed, skin/belongings search completed and pt oriented to unit. Pt stable at this time. Pt given the opportunity to express concerns and ask questions. Pt given toiletries. Will continue to monitor.

## 2015-08-05 NOTE — Progress Notes (Signed)
PT VSS. [employer] transporting. IVC papers and personal belongings provided to [employer].

## 2015-08-05 NOTE — Progress Notes (Signed)

## 2015-08-05 NOTE — Progress Notes (Signed)
Pt attended evening NA, however pt left early.

## 2015-08-05 NOTE — Progress Notes (Signed)
Report called to [facility] receiving RN Erskine Squibb. [employer] department contacted for transport. Justin Mend, RN

## 2015-08-05 NOTE — Progress Notes (Signed)
[facility] PROGRESS NOTE  Carolyn Ayala ZOX:[id number] DOB: Oct 11, 1975 DOA: 08/01/2015 PCP: No primary care provider on file.  Assessment/Plan: #1 major depressive disorder recurrent with psychotic behavior and suicidal ideation/substance-induced mood disorder Patient has been seen in consultation by psychiatry who recommended inpatient psychiatric treatment. Continue current regimen of Abilify, Prozac, Ativan, Remeron. Psychiatry may possibly consider TCAs such as Pamelor or nortriptyline if deemed appropriate as this may also help with patient's neuropathy that she's been complaining about.  #2 DKA/DM 1 poorly controlled Patient was admitted with DKA which resolved without insulin drip. Hemoglobin A1c was 11.4 and 08/03/2015. Patient noted to be eating meals around 3 AM: Spikes and moaning CBGs. Lantus has been increased to 55 units twice daily. Continue meal coverage insulin with NovoLog 15 units 3 times daily and resistant sliding scale insulin. Diabetic coordinator is following and will follow and make recommendations at the behavioral health Hospital. Patient currently on maximum dose Neurontin however with complaints of Neurontin. Patient likely unable to afford Lyrica. If okay with psychiatry may consider starting patient on a TCA such as Pamelor or nortriptyline in addition to her Neurontin for better control of neuropathy.  #3 atypical chest pain Troponins negative 3. She currently chest pain-free. EKG with no ischemic changes. Follow.  #4 sinus tachycardia/GAD Likely secondary to anxiety. Cardiac enzymes were cycled and have been ruled out. Patient drinking lots of coffee. Patient with no signs or symptoms of infection. Patient is not significantly anemic last hemoglobin was 11.9. TSH within normal limits at 1.752. Patient on scheduled Ativan. We'll give a dose of Ativan. Will defer titration of Ativan to psychiatry.  #5 alcohol dependence Continue the Ativan  withdrawal protocol.  Code Status: Full Family Communication: Updated patient. No family at bedside. Disposition Plan: Transfer to behavioral health Hospital.   Consultants:  Psychiatry: Dr. Elsie Saas 08/01/2015  Procedures:  None  Antibiotics:  None  HPI/Subjective: Patient ambulating in hallway. Per nursing patient recently junk 2 cups of coffee. Patient also complaining of anxiety. No shortness of breath. No chest pain. Patient complaining of neuropathy.  Objective: Filed Vitals:   08/04/15 1444 08/04/15 2300  BP: 112/67 112/88  Pulse: 102 120  Temp: 98.2 F (36.8 C) 98.1 F (36.7 C)  Resp: 18 16    Intake/Output Summary (Last 24 hours) at 08/05/15 1437 Last data filed at 08/05/15 0859  Gross per 24 hour  Intake    920 ml  Output      0 ml  Net    920 ml   Filed Weights   08/01/15 1024 08/01/15 2010  Weight: 91.218 kg (201 lb 1.6 oz) 90.719 kg (200 lb)    Exam:   General:  NAD. Anxious.  Cardiovascular: Tachycardia, RR  Respiratory: CTAB  Abdomen: Soft, nontender, nondistended, positive bowel sounds.  Musculoskeletal: No clubbing cyanosis or edema.  Data Reviewed: Basic Metabolic Panel:  Recent Labs Lab 08/01/15 0439 08/01/15 0448 08/01/15 8119 08/02/15 1478 08/02/15 1326 08/03/15 0558 08/04/15 0603  NA 134* 133*  --  132*  --  131* 132*  K 4.1 5.0  --  4.3  --  4.5 4.3  CL 101 102  --  102  --  101 102  CO2 18*  --   --  22  --  20* 22  GLUCOSE 460* 476*  --  240* 398* 400* 447*  BUN 10 11  --  10  --  12 11  CREATININE 0.56 0.60  --  0.60  --  0.76 0.83  CALCIUM 9.5  --   --  8.8*  --  9.5 8.8*  MG  --   --  1.7  --   --  1.8  --    Liver Function Tests:  Recent Labs Lab 08/01/15 0439 08/02/15 0633  AST 25 26  ALT 34 30  ALKPHOS 103 100  BILITOT 0.3 0.1*  PROT 7.9 7.1  ALBUMIN 4.0 3.7   No results for input(s): LIPASE, AMYLASE in the last 168 hours. No results for input(s): AMMONIA in the last 168  hours. CBC:  Recent Labs Lab 08/01/15 0439 08/01/15 0448 08/02/15 0633  WBC 7.7  --  6.6  NEUTROABS 4.1  --   --   HGB 12.2 14.3 11.9*  HCT 38.1 42.0 36.3  MCV 77.9*  --  76.9*  PLT 335  --  300   Cardiac Enzymes:  Recent Labs Lab 08/01/15 0814 08/01/15 1400 08/01/15 2026  TROPONINI <0.03 <0.03 <0.03   BNP (last 3 results) No results for input(s): BNP in the last 8760 hours.  ProBNP (last 3 results) No results for input(s): PROBNP in the last 8760 hours.  CBG:  Recent Labs Lab 08/04/15 1625 08/04/15 1838 08/04/15 2054 08/05/15 0737 08/05/15 1141  GLUCAP 172* 71 285* 244* 325*    Recent Results (from the past 240 hour(s))  Urine culture     Status: None (Preliminary result)   Collection Time: 08/04/15  4:34 PM  Result Value Ref Range Status   Specimen Description URINE, CLEAN CATCH  Final   Special Requests NONE  Final   Culture   Final    TOO YOUNG TO READ Performed at [facility]    Report Status PENDING  Incomplete     Studies: No results found.  Scheduled Meds: . ARIPiprazole  15 mg Oral Daily  . enoxaparin (LOVENOX) injection  40 mg Subcutaneous Q24H  . FLUoxetine  40 mg Oral Daily  . folic acid  1 mg Oral Daily  . gabapentin  900 mg Oral TID PC & HS  . insulin aspart  0-20 Units Subcutaneous TID WC  . insulin aspart  0-5 Units Subcutaneous QHS  . insulin aspart  15 Units Subcutaneous TID WC  . insulin glargine  55 Units Subcutaneous BID  . LORazepam  1 mg Oral TID  . LORazepam  1 mg Oral Once  . mirtazapine  15 mg Oral QHS  . multivitamin with minerals  1 tablet Oral Daily  . sodium chloride flush  3 mL Intravenous Q12H  . thiamine  100 mg Oral Daily   Or  . thiamine  100 mg Intravenous Daily   Continuous Infusions:   Principal Problem:   Major depressive disorder, recurrent episode, severe, with psychotic behavior (HCC) Active Problems:   Alcoholism with alcohol dependence (HCC)   Substance induced mood disorder (HCC)    IDDM (insulin dependent diabetes mellitus) (HCC)   Generalized anxiety disorder   Diabetes type 1, uncontrolled (HCC)   Diabetes mellitus type 1, uncontrolled, insulin dependent (HCC)    Time spent: 40 mins    [facility] MD [facility] Pager [phone]. If 7PM-7AM, please contact night-coverage at [email or web address], password [facility] 08/05/2015, 2:37 PM  LOS: 4 days

## 2015-08-06 ENCOUNTER — Encounter (HOSPITAL_COMMUNITY): Payer: Self-pay | Admitting: Psychiatry

## 2015-08-06 DIAGNOSIS — F333 Major depressive disorder, recurrent, severe with psychotic symptoms: Secondary | ICD-10-CM | POA: Diagnosis present

## 2015-08-06 LAB — GLUCOSE, CAPILLARY
Glucose-Capillary: 249 mg/dL — ABNORMAL HIGH (ref 65–99)
Glucose-Capillary: 272 mg/dL — ABNORMAL HIGH (ref 65–99)
Glucose-Capillary: 241 mg/dL — ABNORMAL HIGH (ref 65–99)
Glucose-Capillary: 260 mg/dL — ABNORMAL HIGH (ref 65–99)

## 2015-08-06 LAB — URINE CULTURE

## 2015-08-06 MED ORDER — CHLORDIAZEPOXIDE HCL 25 MG PO CAPS
25.0000 mg | ORAL_CAPSULE | ORAL | Status: AC
  Administered 2015-08-08 – 2015-08-09 (×2): 25 mg via ORAL
  Filled 2015-08-06 (×2): qty 1

## 2015-08-06 MED ORDER — PREGABALIN 75 MG PO CAPS: 75.0000 mg | ORAL_CAPSULE | Freq: Two times a day (BID) | ORAL | Status: DC

## 2015-08-06 MED ORDER — INSULIN ASPART 100 UNIT/ML ~~LOC~~ SOLN
0.0000 [IU] | Freq: Three times a day (TID) | SUBCUTANEOUS | Status: DC
Start: 2015-08-06 — End: 2015-08-06

## 2015-08-06 MED ORDER — LORAZEPAM 1 MG PO TABS: 1.0000 mg | ORAL_TABLET | Freq: Four times a day (QID) | ORAL | Status: DC

## 2015-08-06 MED ORDER — LORAZEPAM 1 MG PO TABS: 1.0000 mg | ORAL_TABLET | Freq: Three times a day (TID) | ORAL | Status: DC

## 2015-08-06 MED ORDER — HYDROXYZINE HCL 25 MG PO TABS: 25.0000 mg | ORAL_TABLET | Freq: Four times a day (QID) | ORAL | Status: DC | PRN

## 2015-08-06 MED ORDER — LORAZEPAM 1 MG PO TABS: 1.0000 mg | ORAL_TABLET | Freq: Four times a day (QID) | ORAL | Status: DC | PRN

## 2015-08-06 MED ORDER — CHLORDIAZEPOXIDE HCL 25 MG PO CAPS
25.0000 mg | ORAL_CAPSULE | Freq: Every day | ORAL | Status: AC
  Administered 2015-08-10: 25 mg via ORAL
  Filled 2015-08-06 (×2): qty 1

## 2015-08-06 MED ORDER — LORAZEPAM 1 MG PO TABS: 1.0000 mg | ORAL_TABLET | Freq: Two times a day (BID) | ORAL | Status: DC

## 2015-08-06 MED ORDER — INSULIN GLARGINE 100 UNIT/ML ~~LOC~~ SOLN
55.0000 [IU] | Freq: Two times a day (BID) | SUBCUTANEOUS | Status: DC
Start: 2015-08-06 — End: 2015-08-07
  Administered 2015-08-06 – 2015-08-07 (×2): 55 [IU] via SUBCUTANEOUS

## 2015-08-06 MED ORDER — CHLORDIAZEPOXIDE HCL 25 MG PO CAPS
25.0000 mg | ORAL_CAPSULE | Freq: Four times a day (QID) | ORAL | Status: AC | PRN
  Administered 2015-08-08: 25 mg via ORAL
  Filled 2015-08-06: qty 1

## 2015-08-06 MED ORDER — MIRTAZAPINE 30 MG PO TABS
30.0000 mg | ORAL_TABLET | Freq: Every day | ORAL | Status: DC
  Administered 2015-08-06 – 2015-08-11 (×6): 30 mg via ORAL
  Filled 2015-08-06 (×7): qty 1

## 2015-08-06 MED ORDER — INSULIN ASPART 100 UNIT/ML ~~LOC~~ SOLN
4.0000 [IU] | Freq: Three times a day (TID) | SUBCUTANEOUS | Status: DC
Start: 2015-08-06 — End: 2015-08-06

## 2015-08-06 MED ORDER — VITAMIN B-1 100 MG PO TABS
100.0000 mg | ORAL_TABLET | Freq: Every day | ORAL | Status: DC
  Administered 2015-08-07 – 2015-08-17 (×11): 100 mg via ORAL
  Filled 2015-08-06 (×12): qty 1

## 2015-08-06 MED ORDER — CYCLOBENZAPRINE HCL 10 MG PO TABS
10.0000 mg | ORAL_TABLET | Freq: Three times a day (TID) | ORAL | Status: DC | PRN
  Administered 2015-08-06 – 2015-08-16 (×21): 10 mg via ORAL
  Filled 2015-08-06 (×3): qty 2
  Filled 2015-08-06: qty 1
  Filled 2015-08-06 (×4): qty 2
  Filled 2015-08-06 (×2): qty 1
  Filled 2015-08-06 (×3): qty 2
  Filled 2015-08-06 (×2): qty 1
  Filled 2015-08-06: qty 2
  Filled 2015-08-06 (×2): qty 1
  Filled 2015-08-06 (×3): qty 2

## 2015-08-06 MED ORDER — CHLORDIAZEPOXIDE HCL 25 MG PO CAPS
25.0000 mg | ORAL_CAPSULE | Freq: Three times a day (TID) | ORAL | Status: AC
  Administered 2015-08-07 – 2015-08-08 (×3): 25 mg via ORAL
  Filled 2015-08-06 (×3): qty 1

## 2015-08-06 MED ORDER — PREGABALIN 75 MG PO CAPS
75.0000 mg | ORAL_CAPSULE | Freq: Two times a day (BID) | ORAL | Status: DC
  Administered 2015-08-06 – 2015-08-07 (×3): 75 mg via ORAL
  Filled 2015-08-06 (×3): qty 1

## 2015-08-06 MED ORDER — CHLORDIAZEPOXIDE HCL 25 MG PO CAPS
25.0000 mg | ORAL_CAPSULE | Freq: Four times a day (QID) | ORAL | Status: AC
  Administered 2015-08-06 – 2015-08-07 (×4): 25 mg via ORAL
  Filled 2015-08-06 (×4): qty 1

## 2015-08-06 MED ORDER — ADULT MULTIVITAMIN W/MINERALS CH
1.0000 | ORAL_TABLET | Freq: Every day | ORAL | Status: DC
  Administered 2015-08-06 – 2015-08-17 (×12): 1 via ORAL
  Filled 2015-08-06 (×14): qty 1

## 2015-08-06 MED ORDER — LOPERAMIDE HCL 2 MG PO CAPS: 2.0000 mg | ORAL_CAPSULE | ORAL | Status: AC | PRN

## 2015-08-06 MED ORDER — LORAZEPAM 1 MG PO TABS: 1.0000 mg | ORAL_TABLET | Freq: Every day | ORAL | Status: DC

## 2015-08-06 MED ORDER — ONDANSETRON 4 MG PO TBDP: 4.0000 mg | ORAL_TABLET | Freq: Four times a day (QID) | ORAL | Status: AC | PRN

## 2015-08-06 NOTE — Progress Notes (Signed)
Patient in her room awake at the beginning of this shift. She reported still feeling depressed but dr Dub Mikes is working on adjusting her medications. "Dr Dub Mikes said the Remeron will help me with depression and help me sleep". She also said the dose will be increased to 30 mg per the doctor and Abilify will be started in the morning. Pt complaint of muscle pain and requested for flexeril. Her mood and affect flat and depressed. She denied SI/Hi and denied Hallucinations. Writer encouraged and supported patient. Q 15 minute check continues for safety.

## 2015-08-06 NOTE — Progress Notes (Signed)
D: Patient has been cooperative today.  She has not presented any attention seeking behaviors.  Her CBG at 1200 was 272 and she received standing 15 U novolog.  She rates her depression, hopelessness and anxiety as a 10.  She presents with bright affect and pleasant mood.  Her goal is to "get on the right medications."  She complains of withdrawal symptoms such as sedation, chilling, cramping, nausea and irritability.  She denies any SI/HI/AVH. A: Continue to monitor medication management and MD orders.  Safety checks completed every 15 minutes per protocol.  Offer support and encouragement as needed. R: Patient is receptive to staff; her behavior is appropriate.
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
## 2015-08-06 NOTE — Progress Notes (Signed)
[facility] Group Notes:  (Nursing/MHT/Case Management/Adjunct)  Date:  08/06/2015  Time:  2045  Type of Therapy:  wrap up group  Participation Level:  Active  Participation Quality:  Appropriate, Attentive, Sharing and Supportive  Affect:  Appropriate  Cognitive:  Appropriate  Insight:  Lacking  Engagement in Group:  Engaged  Modes of Intervention:  Clarification, Education and Support  Summary of Progress/Problems: Pt shares that she has been inpatient here at [facility] seven times. Pt quickly shares that she is depressed, lost her mother, she is working on communicating her feelings, she wants to do better for herself so she can do better for her son. Pt is hoping that her boyfriend will be monogamous because it is hard for her to know he is with other women but she reports that it is difficult for him.   Carolyn Ayala 08/06/2015, 9:16 PM

## 2015-08-06 NOTE — [facility] Group Notes (Signed)

## 2015-08-06 NOTE — [facility] Suicide Risk Assessment (Signed)
[facility] Admission Suicide Risk Assessment   Nursing information obtained from:  Patient Demographic factors:  Unemployed Current Mental Status:  NA Loss Factors:  Loss of significant relationship Historical Factors:  Impulsivity Risk Reduction Factors:  NA  Total Time spent with patient: 45 minutes Principal Problem: <principal problem not specified> Diagnosis:   Patient Active Problem List   Diagnosis Date Noted  . Diabetes type 1, uncontrolled (HCC) [E10.65] 08/01/2015  . Diabetes mellitus type 1, uncontrolled, insulin dependent (HCC) [E10.65] 08/01/2015  . Alcohol abuse [F10.10]   . Alcohol dependence with alcohol-induced mood disorder (HCC) [F10.24]   . 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 Bipolar disorder, curr episode mixed, severe, with psychotic features (HCC) [F31.64] 05/01/2015  . Uncontrolled diabetes mellitus with neurologic complication, with long-term current use of insulin (HCC) [E11.49, Z79.4, E11.65]   . Uncontrolled diabetes mellitus with diabetic neuropathy, with long-term current use of insulin (HCC) [E11.40, Z79.4, E11.65] 04/21/2015  . PTSD (post-traumatic stress disorder) [F43.10] 04/20/2015  . Alcohol dependence with withdrawal, uncomplicated (HCC) [F10.230] 04/20/2015  . Suicidal ideation [R45.851] 04/19/2015  . Recent bereavement [Z78.9] 04/19/2015  . Generalized anxiety disorder [F41.1] 02/13/2013  . Chronic posttraumatic stress disorder [F43.12] 02/13/2013  . Alcoholism with alcohol dependence (HCC) [F10.20] 02/08/2013  . Substance induced mood disorder (HCC) [F19.94] 02/08/2013  . IDDM (insulin dependent diabetes mellitus) (HCC) [E11.9, Z79.4] 02/08/2013   Subjective Data: 40 Y/o female recently D/C from our service who states that since her mother died she has not been able to get herself together. States she injected herself with insulin to kill self. Increasingly more depressed, hers  voices that are very negative "you are going to build up to nothing, you are ugly, you are fat." States she is very anxious cant relaxed she drinks a lot, 7 40 oz and liquor. Has been  drinking for the last 2 years worst since mother died less than 3 months ago. States she had a hard time Christmas New Years now anticipating mother's day. After she was here last time, went to [facility]. States there  was "too much smoking" She was also at [facility] where she staid a month. Has taken Zoloft, Wellbutrin, Paxil  Past Psyc; [facility] Family History; mother depressed father died alcohol and diabetes, Continued Clinical Symptoms:  Alcohol Use Disorder Identification Test Final Score (AUDIT): 39 The "Alcohol Use Disorders Identification Test", Guidelines for Use in Primary Care, Second Edition.  World Science writer [facility]). Score between 0-7:  no or low risk or alcohol related problems. Score between 8-15:  moderate risk of alcohol related problems. Score between 16-19:  high risk of alcohol related problems. Score 20 or above:  warrants further diagnostic evaluation for alcohol dependence and treatment.   CLINICAL FACTORS:   Alcohol/Depression   Musculoskeletal: Strength & Muscle Tone: within normal limits Gait & Station: normal Patient leans: normal  Psychiatric Specialty Exam: Review of Systems  Constitutional: Positive for malaise/fatigue.  HENT:       Sinus  Eyes: Positive for blurred vision.  Respiratory: Positive for shortness of breath.   Cardiovascular: Positive for palpitations.  Gastrointestinal: Negative.   Genitourinary: Negative.   Musculoskeletal: Positive for myalgias, back pain and joint pain.  Skin: Positive for itching and rash.  Neurological: Positive for weakness and headaches.  Endo/Heme/Allergies: Negative.   Psychiatric/Behavioral: Positive for depression, suicidal ideas, hallucinations and substance abuse. The patient is nervous/anxious and has insomnia.      Blood pressure 120/82,  pulse 120, temperature 98.7 F (37.1 C), temperature source Oral, resp. rate 12, height  (1.575 m), weight 89.359 kg (197 lb), last menstrual period 08/05/2015, SpO2 100 %.Body mass index is 36.02 kg/(m^2).  General Appearance: Fairly Groomed  Patent attorney::  Fair  Speech:  Clear and Coherent  Volume:  fluctuates  Mood:  Anxious, Depressed and Dysphoric  Affect:  anxious depressed worried   Thought Process:  Coherent and Goal Directed  Orientation:  Full (Time, Place, and Person)  Thought Content:  symptoms events worries concerns  Suicidal Thoughts:  Yes.  without intent/plan  Homicidal Thoughts:  No  Memory:  Immediate;   Fair Recent;   Fair Remote;   Fair  Judgement:  Fair  Insight:  Present and Shallow  Psychomotor Activity:  Restlessness  Concentration:  Fair  Recall:  [employer] of Knowledge:Fair  Language: Fair  Akathisia:  No  Handed:  Right  AIMS (if indicated):     Assets:  Desire for Improvement Housing  Sleep:  Number of Hours: 4.75  Cognition: WNL  ADL's:  Intact    COGNITIVE FEATURES THAT CONTRIBUTE TO RISK:  Closed-mindedness, Polarized thinking and Thought constriction (tunnel vision)    SUICIDE RISK:   Moderate:  Frequent suicidal ideation with limited intensity, and duration, some specificity in terms of plans, no associated intent, good self-control, limited dysphoria/symptomatology, some risk factors present, and identifiable protective factors, including available and accessible social support.  PLAN OF CARE: Supportive approach/coping skills Alcohol dependence; Librium detox protocol/work a relapse prevention plan Depression; will resume the Remeron 30 mg HS/work with grief and loss Hallucinations: Abilify 15 mg Pain: will continue the Neurontin 900 mg TID, will add Lyrica 75 mg BID ( has used it before) Work with CBT/mindfulness; grief and loss Explore residential treatment options I certify that inpatient services  furnished can reasonably be expected to improve the patient's condition.   Rachael Fee, MD 08/06/2015, 2:03 PM

## 2015-08-06 NOTE — [facility] Group Notes (Signed)
[facility] LCSW Group Therapy 08/06/2015  1:15 PM   Type of Therapy: Group Therapy  Participation Level: Did Not Attend. Patient invited to participate but declined.   Josiyah Tozzi, MSW, LCSW Clinical Social Worker [facility] [phone]   

## 2015-08-06 NOTE — Clinical Social Work Note (Signed)
Pt rejected [facility] and [facility] as referral possibilities as she was at both recently, and found neither one helpful.  She asked for a referral to [facility] rehab.  They informed me that they are not able to work with patients who have diabetes.  Pt informed.

## 2015-08-06 NOTE — H&P (Signed)
Psychiatric Admission Assessment Adult  Patient Identification: Carolyn Ayala  MRN:  [id number]  Date of Evaluation:  08/06/2015  Chief Complaint: Suicide attempt, alcohol use disorder  Principal Diagnosis: Major depressive disorder, recurrent episode, severe, with psychotic behavior (HCC)  Diagnosis:   Patient Active Problem List   Diagnosis Date Noted  . Diabetes type 1, uncontrolled (HCC) [E10.65] 08/01/2015  . Diabetes mellitus type 1, uncontrolled, insulin dependent (HCC) [E10.65] 08/01/2015  . Alcohol abuse [F10.10]   . Alcohol dependence with alcohol-induced mood disorder (HCC) [F10.24]   . 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 Bipolar disorder, curr episode mixed, severe, with psychotic features (HCC) [F31.64] 05/01/2015  . Uncontrolled diabetes mellitus with neurologic complication, with long-term current use of insulin (HCC) [E11.49, Z79.4, E11.65]   . Uncontrolled diabetes mellitus with diabetic neuropathy, with long-term current use of insulin (HCC) [E11.40, Z79.4, E11.65] 04/21/2015  . PTSD (post-traumatic stress disorder) [F43.10] 04/20/2015  . Alcohol dependence with withdrawal, uncomplicated (HCC) [F10.230] 04/20/2015  . Suicidal ideation [R45.851] 04/19/2015  . Recent bereavement [Z78.9] 04/19/2015  . Generalized anxiety disorder [F41.1] 02/13/2013  . Chronic posttraumatic stress disorder [F43.12] 02/13/2013  . Alcoholism with alcohol dependence (HCC) [F10.20] 02/08/2013  . Substance induced mood disorder (HCC) [F19.94] 02/08/2013  . IDDM (insulin dependent diabetes mellitus) (HCC) [E11.9, Z79.4] 02/08/2013   History of Present Illness: Carolyn Ayala is a 40 year old African-American female. Admitted to [facility] adult unit from the medical floor of the [facility] with complaints of suicide attempt by overdose on insulin, Metformin & alcohol intoxication. She was recently discharged from  this hospital on 07-09-15 after receiving alcohol detoxification & mood stabilization treatments. She was discharged to follow-up care at the [facility] clinic both in [place], [place].   During this admission assessment, Aldora reports, "The ambulance took me to the [facility] on 18th of this month. My mother died a couple of weeks ago. Her passing got me very depressed, I could not take it, so I took an overdose of my diabetic medicines. I injected a whole bottle of Lantus, 73/30 insulin & 9 tablets of metformin. I fell out at a store, someone saw me & called 911. I almost died. When they got me to the hospital, my blood sugar was 30. I have been depressed for 2 years, my mother's death worsened it. I'm on medication for depression, they are not working. I hear voices too. The voices are telling me that I'm worthless, useless, that I should kill myself. I self-medicate with alcohol. I drank last on the 18th prior to my overdose. I'm an alcoholic. My longest sobriety was 9 months, that was 11 years ago. I relapsed because I want to keep drinking alcohol. I got diabetes & neuropathy. I have been on Sertraline &Paxil, these medicines did not help me. I think I'm withdrawing from the alcohol now because I feel shaky, sweaty a lot & restless.  Associated Signs/Symptoms:  Depression Symptoms:  depressed mood, insomnia, feelings of worthlessness/guilt, hopelessness, suicidal thoughts with specific plan,  (Hypo) Manic Symptoms:  Hallucinations, Impulsivity, Irritable Mood, Labiality of Mood,  Anxiety Symptoms:  Excessive Worry,  Psychotic Symptoms:  Hallucinations: Auditory  PTSD Symptoms: None reported  Total Time spent with patient: 1 hour  Past Psychiatric History: Bipolar affective disorder  Is the patient at risk to self? No. "Unless someone gets in my way) Has the patient been a risk to self in the past 6 months? Yes.  Has the patient been a risk to self within the distant  past? No.  Is the patient a risk to others? No.  Has the patient been a risk to others in the past 6 months? No.  Has the patient been a risk to others within the distant past? No.   Prior Inpatient Therapy: Yes, [facility] x multiple times).  Prior Outpatient Therapy: Yes, ([facility], [facility])  Alcohol Screening: 1. How often do you have a drink containing alcohol?: 4 or more times a week 2. How many drinks containing alcohol do you have on a typical day when you are drinking?: 7, 8, or 9 3. How often do you have six or more drinks on one occasion?: Daily or almost daily Preliminary Score: 7 4. How often during the last year have you found that you were not able to stop drinking once you had started?: Daily or almost daily 5. How often during the last year have you failed to do what was normally expected from you becasue of drinking?: Daily or almost daily 6. How often during the last year have you needed a first drink in the morning to get yourself going after a heavy drinking session?: Daily or almost daily 7. How often during the last year have you had a feeling of guilt of remorse after drinking?: Daily or almost daily 8. How often during the last year have you been unable to remember what happened the night before because you had been drinking?: Daily or almost daily 9. Have you or someone else been injured as a result of your drinking?: Yes, during the last year 10. Has a relative or friend or a doctor or another health worker been concerned about your drinking or suggested you cut down?: Yes, during the last year Alcohol Use Disorder Identification Test Final Score (AUDIT): 39 Brief Intervention: Yes  Substance Abuse History in the last 12 months:  Yes.    Consequences of Substance Abuse: Medical Consequences:  Liver damage, Possible death by overdose Legal Consequences:  Arrests, jail time, Loss of driving privilege. Family Consequences:  Family discord, divorce and or separation.  Previous  Psychotropic Medications: Yes (Abilify 15 mg for mood control, Gabapentin 900 mg for neuropathic pain, Hydroxyzine 25 mg for anxiety, Mirtazapine 15 mg for depression/insomnia)  Psychological Evaluations: Yes   Past Medical History:  Past Medical History  Diagnosis Date  . Mental disorder   . Depression   . Diabetes mellitus without complication (HCC)   . Hypertension     Past Surgical History  Procedure Laterality Date  . Cesarean section     Family History:  Family History  Problem Relation Age of Onset  . Depression Mother    Family Psychiatric  History: Depression: mother  Tobacco Screening: @FLOW [phone])::1)@  Social History:  History  Alcohol Use  . Yes    Comment: daily     History  Drug Use No    Additional Social History: Pain Medications: pt denies abuse Prescriptions: pt denies abuse Over the Counter: pt denies abuse History of alcohol / drug use?: Yes Longest period of sobriety (when/how long): unknown Negative Consequences of Use: Personal relationships Withdrawal Symptoms: Tremors  Allergies:  No Known Allergies  Lab Results:  Results for orders placed or performed during the hospital encounter of 08/05/15 (from the past 48 hour(s))  Glucose, capillary     Status: Abnormal   Collection Time: 08/05/15  8:43 PM  Result Value Ref Range   Glucose-Capillary 338 (H) 65 - 99  mg/dL  Glucose, capillary     Status: Abnormal   Collection Time: 08/06/15  6:06 AM  Result Value Ref Range   Glucose-Capillary 249 (H) 65 - 99 mg/dL  Glucose, capillary     Status: Abnormal   Collection Time: 08/06/15 12:03 PM  Result Value Ref Range   Glucose-Capillary 272 (H) 65 - 99 mg/dL   Comment 1 Notify RN    Blood Alcohol level:  Lab Results  Component Value Date   ETH 108* 08/01/2015   ETH 103* 04/17/2015   Metabolic Disorder Labs:  Lab Results  Component Value Date   HGBA1C 11.4* 08/03/2015   MPG 280 08/03/2015   MPG 283 08/01/2015   Lab Results   Component Value Date   PROLACTIN 10.7 04/28/2015   Lab Results  Component Value Date   CHOL 233* 04/29/2015   TRIG 62 04/29/2015   HDL 89 04/29/2015   CHOLHDL 2.6 04/29/2015   VLDL 12 04/29/2015   LDLCALC 132* 04/29/2015   Current Medications: Current Facility-Administered Medications  Medication Dose Route Frequency Provider Last Rate Last Dose  . acetaminophen (TYLENOL) tablet 650 mg  650 mg Oral Q6H PRN Adonis Brook, NP   650 mg at 08/06/15 0752  . alum & mag hydroxide-simeth (MAALOX/MYLANTA) 200-200-20 MG/5ML suspension 30 mL  30 mL Oral Q4H PRN Adonis Brook, NP      . ARIPiprazole (ABILIFY) tablet 15 mg  15 mg Oral Daily Adonis Brook, NP   15 mg at 08/06/15 0753  . fluticasone (FLONASE) 50 MCG/ACT nasal spray 1 spray  1 spray Each Nare Daily PRN Adonis Brook, NP      . gabapentin (NEURONTIN) capsule 900 mg  900 mg Oral TID PC & HS Adonis Brook, NP   900 mg at 08/06/15 1205  . hydrOXYzine (ATARAX/VISTARIL) tablet 25 mg  25 mg Oral Q6H PRN Kerry Hough, PA-C   25 mg at 08/06/15 0753  . insulin aspart (novoLOG) injection 15 Units  15 Units Subcutaneous TID WC Adonis Brook, NP   15 Units at 08/06/15 1206  . insulin glargine (LANTUS) injection 55 Units  55 Units Subcutaneous BID Adonis Brook, NP   55 Units at 08/06/15 0755  . magnesium hydroxide (MILK OF MAGNESIA) suspension 30 mL  30 mL Oral Daily PRN Adonis Brook, NP      . metFORMIN (GLUCOPHAGE) tablet 1,000 mg  1,000 mg Oral BID WC Adonis Brook, NP   1,000 mg at 08/06/15 0753  . mirtazapine (REMERON) tablet 15 mg  15 mg Oral QHS Adonis Brook, NP   15 mg at 08/05/15 2326  . multivitamin with minerals tablet 1 tablet  1 tablet Oral Daily Rachael Fee, MD   1 tablet at 08/06/15 1610   PTA Medications: Prescriptions prior to admission  Medication Sig Dispense Refill Last Dose  . ARIPiprazole (ABILIFY) 15 MG tablet Take 1 tablet (15 mg total) by mouth daily. 30 tablet 0 Past Week at Unknown time  . capsaicin  (ZOSTRIX) 0.025 % cream Apply 1 application topically 2 (two) times daily as needed (burning).   unknown  . diclofenac sodium (VOLTAREN) 1 % GEL Apply 2 g topically 2 (two) times daily as needed (pain).   Past Month at Unknown time  . FLUoxetine (PROZAC) 40 MG capsule Take 40 mg by mouth daily.   Past Week at Unknown time  . fluticasone (FLONASE) 50 MCG/ACT nasal spray Place 1 spray into both nostrils daily as needed for allergies or rhinitis.   unknown  .  gabapentin (NEURONTIN) 300 MG capsule Take 3 capsules (900 mg total) by mouth 4 (four) times daily - after meals and at bedtime. 360 capsule 0 07/31/2015 at Unknown time  . insulin aspart (NOVOLOG) 100 UNIT/ML injection Inject 15 Units into the skin 3 (three) times daily with meals. 10 mL 0   . insulin glargine (LANTUS) 100 UNIT/ML injection Inject 0.55 mLs (55 Units total) into the skin 2 (two) times daily. 10 mL 0   . LORazepam (ATIVAN) 1 MG tablet Take 1 tablet (1 mg total) by mouth 3 (three) times daily. 3 tablet 0   . Melatonin 5 MG TABS Take 5 mg by mouth at bedtime as needed (sleep).   Past Week at Unknown time  . metFORMIN (GLUCOPHAGE) 1000 MG tablet Take 1,000 mg by mouth 2 (two) times daily with a meal.   07/31/2015 at Unknown time  . mirtazapine (REMERON) 15 MG tablet Take 1 tablet (15 mg total) by mouth at bedtime. 30 tablet 0 07/31/2015 at Unknown time  . Multiple Vitamins-Minerals (MULTIVITAMIN WITH MINERALS) tablet Take 1 tablet by mouth daily.   07/31/2015 at Unknown time  . Skin Protectants, Misc. (EUCERIN) cream Apply 1 application topically 2 (two) times daily as needed for dry skin.   Past Week at Unknown time  . Thiamine HCl (VITAMIN B-1 PO) Take 1 tablet by mouth 2 (two) times daily.   07/31/2015 at Unknown time   Musculoskeletal: Strength & Muscle Tone: within normal limits Gait & Station: normal Patient leans: N/A  Psychiatric Specialty Exam: Physical Exam  Constitutional: She is oriented to person, place, and time. She  appears well-developed.  Obese  HENT:  Head: Normocephalic.  Eyes: Pupils are equal, round, and reactive to light.  Neck: Normal range of motion.  Cardiovascular: Normal rate.   Hx. HTN  Respiratory: Effort normal.  GI: Soft.  Genitourinary:  Denies any issues in this area  Musculoskeletal: Normal range of motion.  Neurological: She is alert and oriented to person, place, and time.  Skin: Skin is warm and dry.  Psychiatric: Her speech is normal. Thought content normal. Her mood appears anxious (Rates #10). Her affect is labile and inappropriate. Her affect is not angry and not blunt. She is actively hallucinating (Reports auditory hallucinations). Cognition and memory are normal. She expresses impulsivity and inappropriate judgment. She exhibits a depressed mood (Rates #20).    Review of Systems  Constitutional: Positive for chills, malaise/fatigue and diaphoresis.  Eyes: Positive for blurred vision.  Respiratory: Negative.   Cardiovascular: Negative.        Hx. HTN  Gastrointestinal: Positive for nausea.  Genitourinary: Negative.   Musculoskeletal: Negative.   Skin: Negative.   Neurological: Positive for dizziness, weakness and headaches.  Endo/Heme/Allergies: Negative.   Psychiatric/Behavioral: Positive for depression (Rates #20), hallucinations (Auditory) and substance abuse (Alcohol dependence). Negative for suicidal ideas and memory loss. The patient is nervous/anxious (Rates #10) and has insomnia.     Blood pressure 120/82, pulse 120, temperature 98.7 F (37.1 C), temperature source Oral, resp. rate 12, height  (1.575 m), weight 89.359 kg (197 lb), last menstrual period 08/05/2015, SpO2 100 %.Body mass index is 36.02 kg/(m^2).  General Appearance: Disheveled, Obese  Eye Contact::  Fair  Speech:  Clear, coherent, normal rate, not spontaneous  Volume:  Normal  Mood:  Depressed, anxious. rated depression #20, anxiety #10  Affect:  Non-Congruent  Thought Process:   Coherent, Intact and Logical  Orientation:  Full (Time, Place, and Person)  Thought Content:  Rumination, Admits auditory hallucinations  Suicidal Thoughts:  Denies  Homicidal Thoughts:  "No, unless someone gets in my way"  Memory:  Grossly intact  Judgement:  Fair  Insight:  Shallow  Psychomotor Activity:  Reports restlesslness, high anxiety & tremors  Concentration:  Fair  Recall:  Good  Fund of Knowledge:Fair  Language: Good  Akathisia:  No  Handed:  Right  AIMS (if indicated):     Assets:  Communication Skills Desire for Improvement  ADL's:  Impaired  Cognition: Fairly intact  Sleep:  Number of Hours: 4.75   Treatment Plan/Recommendations: 1. Admit for crisis management and stabilization, estimated length of stay 3-5 days.  2. Medication management to reduce current symptoms to base line and improve the patient's overall level of functioning: Resumed; Abilify 15 mg for mood control, Gabapentin 900 mg for neuropathic pain, Hydroxyzine 25 mg for anxiety, Mirtazapine 15 mg for depression/insomnia,  3. Treat health problems as indicated; Resumed; Flonase 50 mcg for allergies, Insulin  (Novolog) 15 units, Insulin (Lantus) 55 units for DM, Metformin 1,000 mgh for DM. 4. Develop treatment plan to decrease risk of relapse upon discharge and the need for readmission.  5. Psycho-social education regarding relapse prevention and self care.  6. Health care follow up as needed for medical problems.  7. Review, reconcile, and reinstate any pertinent home medications for other health issues where appropriate. 8. Call for consults with hospitalist for any additional specialty patient care services as needed.  Observation Level/Precautions:  15 minute checks  Laboratory:  Per ED, ETOH 108, UDS (+) for Benzodiazepine.  Psychotherapy:  Group sessions, AA/NA meetings  Medications: Abilify 15 mg for mood control, Gabapentin 900 mg for neuropathic pain, Hydroxyzine 25 mg for anxiety, Mirtazapine 15  mg for depression/insomnia, Flonase 50 mcg for allergies, Insulin  (Novolog) 15 units, Insulin (Lantus) 55 units for DM, Metformin 1,000 mg for DM,   Consultations: As needed   Discharge Concerns: Sobriety, Mood stability  Estimated LOS: 2-4 days  Other: Admit to [facility]   I certify that inpatient services furnished can reasonably be expected to improve the patient's condition.    Sanjuana Kava, NP, PMHNP-BC 2/23/20171:29 PM I personally assessed the patient, reviewed the physical exam and labs and formulated the treatment plan Madie Reno A. Dub Mikes, M.D.

## 2015-08-06 NOTE — Progress Notes (Signed)
D   Pt is irritable and medication seeking   She has poor boundaries and can be irritable with others    Pt refused her entire dose of insulin and would only take 30 units of the 55 units of lantus ordered A   Verbal support given   Medications administered and effectiveness monitored   Q 15 min checks R   Pt safe at present

## 2015-08-06 NOTE — [facility] Suicide Risk Assessment (Signed)
[facility] INPATIENT:  Family/Significant Other Suicide Prevention Education  Suicide Prevention Education:  Patient Refusal for Family/Significant Other Suicide Prevention Education: The patient Carolyn Ayala has refused to provide written consent for family/significant other to be provided Family/Significant Other Suicide Prevention Education during admission and/or prior to discharge.  Physician notified.  Daryel Gerald B 08/06/2015, 4:59 PM

## 2015-08-06 NOTE — Tx Team (Signed)
Interdisciplinary Treatment Plan Update (Adult) Date: 08/06/2015    Time Reviewed: 9:30 AM  Progress in Treatment: Attending groups: Continuing to assess, patient new to milieu Participating in groups: Continuing to assess, patient new to milieu Taking medication as prescribed: Yes Tolerating medication: Yes Family/Significant other contact made: No, CSW assessing for appropriate contacts Patient understands diagnosis: Yes Discussing patient identified problems/goals with staff: Yes Medical problems stabilized or resolved: Yes Denies suicidal/homicidal ideation: Yes Issues/concerns per patient self-inventory: Yes Other:  New problem(s) identified: N/A  Discharge Plan or Barriers: CSW continuing to assess, patient new to milieu.  Reason for Continuation of Hospitalization:  Depression Anxiety Medication Stabilization   Comments: N/A  Estimated length of stay: 3-5 days    Patient is a 40 year old female with a history of Major Depressive Disorder and Bipolar Disorder. Pt presented to the hospital with depression, anxiety, alcohol intoxication, and suicidal ideations. Pt reports primary trigger(s) for admission were a recent break up and family death. Patient will benefit from crisis stabilization, medication evaluation, group therapy and psycho education in addition to case management for discharge planning. At discharge, it is recommended that Pt remain compliant with established discharge plan and continued treatment.   Review of initial/current patient goals per problem list:  1. Goal(s): Patient will participate in aftercare plan   Met: No   Target date: 3-5 days post admission date   As evidenced by: Patient will participate within aftercare plan AEB aftercare provider and housing plan at discharge being identified.  2/23: Goal not met: CSW assessing for appropriate referrals for pt and will have follow up secured prior to d/c.    2. Goal (s): Patient will  exhibit decreased depressive symptoms and suicidal ideations.   Met: No   Target date: 3-5 days post admission date   As evidenced by: Patient will utilize self rating of depression at 3 or below and demonstrate decreased signs of depression or be deemed stable for discharge by MD.  2/23: Goal not met: Pt presents with flat affect and depressed mood.  Pt admitted with depression rating of 10.  Pt to show decreased sign of depression and a rating of 3 or less before d/c.       3. Goal(s): Patient will demonstrate decreased signs and symptoms of anxiety.   Met: No   Target date: 3-5 days post admission date   As evidenced by: Patient will utilize self rating of anxiety at 3 or below and demonstrated decreased signs of anxiety, or be deemed stable for discharge by MD  2/23: Goal not met: Pt presents with anxious mood and affect.  Pt admitted with anxiety rating of 10.  Pt to show decreased sign of anxiety and a rating of 3 or less before d/c.      4. Goal(s): Patient will demonstrate decreased signs of withdrawal due to substance abuse   Met: Yes   Target date: 3-5 days post admission date   As evidenced by: Patient will produce a CIWA/COWS score of 0, have stable vitals signs, and no symptoms of withdrawal   2/23: Goal met. No withdrawal symptoms reported at this time per medical chart.     Attendees: Patient:    Family:    Physician: Dr. Sabra Heck 08/06/2015 9:30 AM  Nursing: Mayra Neer, Grayland Ormond, RN 08/06/2015 9:30 AM  Clinical Social Worker: Tilden Fossa, LCSW 08/06/2015 9:30 AM  Other: Peri Maris, LCSWA; [place], LCSW  08/06/2015 9:30 AM  Other:  08/06/2015 9:30 AM  Other: Lars Pinks, Case Manager 08/06/2015 9:30 AM  Other: May Dot Been [facility] NP 08/06/2015 9:30 AM  Other:           Scribe for Treatment Team:  Tilden Fossa, [place]

## 2015-08-07 LAB — LIPID PANEL
Cholesterol: 248 mg/dL — ABNORMAL HIGH (ref 0–200)
HDL: 83 mg/dL (ref >40)
LDL Cholesterol: 126 mg/dL — ABNORMAL HIGH (ref 0–99)
Total CHOL/HDL Ratio: 3 RATIO
Triglycerides: 195 mg/dL — ABNORMAL HIGH (ref <150)
VLDL: 39 mg/dL (ref 0–40)

## 2015-08-07 LAB — GLUCOSE, CAPILLARY
Glucose-Capillary: 333 mg/dL — ABNORMAL HIGH (ref 65–99)
Glucose-Capillary: 210 mg/dL — ABNORMAL HIGH (ref 65–99)
Glucose-Capillary: 282 mg/dL — ABNORMAL HIGH (ref 65–99)
Glucose-Capillary: 105 mg/dL — ABNORMAL HIGH (ref 65–99)

## 2015-08-07 MED ORDER — INSULIN ASPART 100 UNIT/ML ~~LOC~~ SOLN
18.0000 [IU] | Freq: Three times a day (TID) | SUBCUTANEOUS | Status: DC
  Administered 2015-08-07 – 2015-08-10 (×8): 18 [IU] via SUBCUTANEOUS

## 2015-08-07 MED ORDER — INSULIN GLARGINE 100 UNIT/ML ~~LOC~~ SOLN
60.0000 [IU] | Freq: Two times a day (BID) | SUBCUTANEOUS | Status: DC
  Administered 2015-08-07 – 2015-08-17 (×18): 60 [IU] via SUBCUTANEOUS

## 2015-08-07 MED ORDER — IBUPROFEN 800 MG PO TABS
800.0000 mg | ORAL_TABLET | Freq: Four times a day (QID) | ORAL | Status: DC | PRN
  Administered 2015-08-07 – 2015-08-14 (×16): 800 mg via ORAL
  Filled 2015-08-07 (×16): qty 1

## 2015-08-07 MED ORDER — INSULIN ASPART 100 UNIT/ML ~~LOC~~ SOLN
0.0000 [IU] | Freq: Every day | SUBCUTANEOUS | Status: DC
  Administered 2015-08-12: 2 [IU] via SUBCUTANEOUS

## 2015-08-07 MED ORDER — ARIPIPRAZOLE 5 MG PO TABS
5.0000 mg | ORAL_TABLET | Freq: Once | ORAL | Status: AC
  Administered 2015-08-07: 5 mg via ORAL
  Filled 2015-08-07 (×2): qty 1

## 2015-08-07 MED ORDER — INSULIN ASPART 100 UNIT/ML ~~LOC~~ SOLN
0.0000 [IU] | Freq: Three times a day (TID) | SUBCUTANEOUS | Status: DC
  Administered 2015-08-07: 8 [IU] via SUBCUTANEOUS
  Administered 2015-08-08: 11 [IU] via SUBCUTANEOUS
  Administered 2015-08-08: 2 [IU] via SUBCUTANEOUS
  Administered 2015-08-08: 11 [IU] via SUBCUTANEOUS
  Administered 2015-08-09: 2 [IU] via SUBCUTANEOUS
  Administered 2015-08-09: 15 [IU] via SUBCUTANEOUS
  Administered 2015-08-10 (×2): 8 [IU] via SUBCUTANEOUS
  Administered 2015-08-11: 3 [IU] via SUBCUTANEOUS
  Administered 2015-08-11 (×2): 15 [IU] via SUBCUTANEOUS
  Administered 2015-08-12: 11 [IU] via SUBCUTANEOUS
  Administered 2015-08-12: 8 [IU] via SUBCUTANEOUS
  Administered 2015-08-12: 5 [IU] via SUBCUTANEOUS
  Administered 2015-08-13: 2 [IU] via SUBCUTANEOUS
  Administered 2015-08-13: 3 [IU] via SUBCUTANEOUS
  Administered 2015-08-13: 5 [IU] via SUBCUTANEOUS
  Administered 2015-08-14: 3 [IU] via SUBCUTANEOUS
  Administered 2015-08-14: 8 [IU] via SUBCUTANEOUS
  Administered 2015-08-14: 5 [IU] via SUBCUTANEOUS
  Administered 2015-08-15: 8 [IU] via SUBCUTANEOUS
  Administered 2015-08-15: 5 [IU] via SUBCUTANEOUS
  Administered 2015-08-15: 15 [IU] via SUBCUTANEOUS
  Administered 2015-08-16 (×3): 8 [IU] via SUBCUTANEOUS
  Administered 2015-08-17: 3 [IU] via SUBCUTANEOUS

## 2015-08-07 MED ORDER — ARIPIPRAZOLE 10 MG PO TABS
20.0000 mg | ORAL_TABLET | Freq: Every day | ORAL | Status: DC
  Administered 2015-08-08 – 2015-08-11 (×4): 20 mg via ORAL
  Filled 2015-08-07 (×6): qty 2

## 2015-08-07 NOTE — [facility] Group Notes (Signed)
[facility] LCSW Group Therapy  08/07/2015 2:50 PM  Type of Therapy:  Group Therapy  Participation Level:  Did Not Attend-pt chose to leave group and did not want to participate.   Summary of Progress/Problems: Feelings around Relapse. Group members discussed the meaning of relapse and shared personal stories of relapse, how it affected them and others, and how they perceived themselves during this time. Group members were encouraged to identify triggers, warning signs and coping skills used when facing the possibility of relapse. Social supports were discussed and explored in detail.   Smart, Lakeyshia Tuckerman LCSW 08/07/2015, 2:50 PM

## 2015-08-07 NOTE — Progress Notes (Signed)
Recreation Therapy Notes  Date: 02.24.2017 Time: 9:30am Location: [address] Group Room  Group Topic: Stress Management  Goal Area(s) Addresses:  Patient will actively participate in stress management techniques presented during session.   Behavioral Response: Did not attend.   Marykay Lex Melane Windholz, LRT/CTRS  Kimala Horne L 08/07/2015 10:09 AM

## 2015-08-07 NOTE — Progress Notes (Addendum)
Inpatient Diabetes Program Recommendations  AACE/ADA: New Consensus Statement on Inpatient Glycemic Control (2015)  Target Ranges:  Prepandial:   less than 140 mg/dL      Peak postprandial:   less than 180 mg/dL (1-2 hours)      Critically ill patients:  140 - 180 mg/dL   Results for Carolyn Ayala, Carolyn Ayala (MRN [id number]) as of 08/07/2015 13:40  Ref. Range 08/06/2015 06:06 08/06/2015 12:03 08/06/2015 17:01 08/06/2015 20:59  Glucose-Capillary Latest Ref Range: 65-99 mg/dL 782 (H) 956 (H) 213 (H) 260 (H)   Results for Carolyn Ayala, Carolyn Ayala (MRN [id number]) as of 08/07/2015 13:40  Ref. Range 08/07/2015 05:50 08/07/2015 12:06  Glucose-Capillary Latest Ref Range: 65-99 mg/dL 629 (H) 528 (H)    Home DM Meds: Lantus 55 units bid       Novolog 15 units tidwc       Metformin 1000 mg bid     Spoke with Armandina Stammer, NP with [facility] unit.  Discussed patient's current blood sugar levels with Nicole Kindred.  Also gave insulin adjustment recommendations to [facility] for this patient.  Received telephone orders from Ms. Nwoko to place new insulin orders:   1. Increase Lantus to 60 units bid (10% increase)  2. Increase Novolog Meal Coverage to 18 units tidwc  3. Start Novolog Moderate Correction Scale/ SSI (0-15 units) TID AC + HS   Orders placed and reviewed with RN.      --Will follow patient during hospitalization--  Ambrose Finland RN, MSN, CDE Diabetes Coordinator Inpatient Glycemic Control Team Team Pager: [phone] (8a-5p)

## 2015-08-07 NOTE — Progress Notes (Signed)
[facility] MD Progress Note  08/07/2015 1:33 PM Carolyn Ayala  MRN:  [id number] Subjective:  Carolyn Ayala states she needs to go to a residential treatment program as she states she does not think she would make it otherwise. States she really wants to do the right thing. She states she is depressed she is hearing voices, she "needs help." Her glucose control is not adequate.  Principal Problem: Depression, major, recurrent, severe with psychosis (HCC) Diagnosis:   Patient Active Problem List   Diagnosis Date Noted  . 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 Diabetes type 1, uncontrolled (HCC) [E10.65] 08/01/2015  . Diabetes mellitus type 1, uncontrolled, insulin dependent (HCC) [E10.65] 08/01/2015  . Alcohol abuse [F10.10]   . Alcohol dependence with alcohol-induced mood disorder (HCC) [F10.24]   . Bipolar disorder, curr episode mixed, severe, with psychotic features (HCC) [F31.64] 05/01/2015  . Uncontrolled diabetes mellitus with neurologic complication, with long-term current use of insulin (HCC) [E11.49, Z79.4, E11.65]   . Uncontrolled diabetes mellitus with diabetic neuropathy, with long-term current use of insulin (HCC) [E11.40, Z79.4, E11.65] 04/21/2015  . PTSD (post-traumatic stress disorder) [F43.10] 04/20/2015  . Alcohol dependence with withdrawal, uncomplicated (HCC) [F10.230] 04/20/2015  . Suicidal ideation [R45.851] 04/19/2015  . Generalized anxiety disorder [F41.1] 02/13/2013  . Chronic posttraumatic stress disorder [F43.12] 02/13/2013  . Substance induced mood disorder (HCC) [F19.94] 02/08/2013  . IDDM (insulin dependent diabetes mellitus) (HCC) [E11.9, Z79.4] 02/08/2013   Total Time spent with patient: 20 minutes  Past Psychiatric History: see admission H and P  Past Medical History:  Past Medical History  Diagnosis Date  . Mental disorder   . Depression   . Diabetes mellitus without complication (HCC)   . Hypertension      Past Surgical History  Procedure Laterality Date  . Cesarean section     Family History:  Family History  Problem Relation Age of Onset  . Depression Mother    Family Psychiatric  History: see admission H and P Social History:  History  Alcohol Use  . Yes    Comment: daily     History  Drug Use No    Social History   Social History  . Marital Status: Single    Spouse Name: N/A  . Number of Children: N/A  . Years of Education: N/A   Social History Main Topics  . Smoking status: Never Smoker   . Smokeless tobacco: Current User  . Alcohol Use: Yes     Comment: daily  . Drug Use: No  . Sexual Activity: No   Other Topics Concern  . None   Social History Narrative   ** Merged History Encounter **       Additional Social History:    Pain Medications: pt denies abuse Prescriptions: pt denies abuse Over the Counter: pt denies abuse History of alcohol / drug use?: Yes Longest period of sobriety (when/how long): unknown Negative Consequences of Use: Personal relationships Withdrawal Symptoms: Tremors                    Sleep: Poor  Appetite:  Fair  Current Medications: Current Facility-Administered Medications  Medication Dose Route Frequency Provider Last Rate Last Dose  . acetaminophen (TYLENOL) tablet 650 mg  650 mg Oral Q6H PRN Adonis Brook, NP   650 mg at 08/06/15 2229  . alum & mag hydroxide-simeth (MAALOX/MYLANTA) 200-200-20 MG/5ML suspension 30 mL  30 mL Oral Q4H PRN Adonis Brook, NP      . [  START ON 08/08/2015] ARIPiprazole (ABILIFY) tablet 20 mg  20 mg Oral Daily Rachael Fee, MD      . chlordiazePOXIDE (LIBRIUM) capsule 25 mg  25 mg Oral Q6H PRN Rachael Fee, MD      . chlordiazePOXIDE (LIBRIUM) capsule 25 mg  25 mg Oral TID Rachael Fee, MD       Followed by  . [START ON 08/08/2015] chlordiazePOXIDE (LIBRIUM) capsule 25 mg  25 mg Oral BH-qamhs Rachael Fee, MD       Followed by  . [START ON 08/10/2015] chlordiazePOXIDE (LIBRIUM)  capsule 25 mg  25 mg Oral Daily Rachael Fee, MD      . cyclobenzaprine (FLEXERIL) tablet 10 mg  10 mg Oral TID PRN Rachael Fee, MD   10 mg at 08/07/15 0807  . fluticasone (FLONASE) 50 MCG/ACT nasal spray 1 spray  1 spray Each Nare Daily PRN Adonis Brook, NP      . gabapentin (NEURONTIN) capsule 900 mg  900 mg Oral TID PC & HS Adonis Brook, NP   900 mg at 08/07/15 1205  . hydrOXYzine (ATARAX/VISTARIL) tablet 25 mg  25 mg Oral Q6H PRN Kerry Hough, PA-C   25 mg at 08/07/15 1610  . ibuprofen (ADVIL,MOTRIN) tablet 800 mg  800 mg Oral Q6H PRN Rachael Fee, MD   800 mg at 08/07/15 1120  . insulin aspart (novoLOG) injection 15 Units  15 Units Subcutaneous TID WC Adonis Brook, NP   15 Units at 08/07/15 1205  . insulin glargine (LANTUS) injection 55 Units  55 Units Subcutaneous BID Sanjuana Kava, NP   55 Units at 08/07/15 0810  . loperamide (IMODIUM) capsule 2-4 mg  2-4 mg Oral PRN Rachael Fee, MD      . magnesium hydroxide (MILK OF MAGNESIA) suspension 30 mL  30 mL Oral Daily PRN Adonis Brook, NP      . metFORMIN (GLUCOPHAGE) tablet 1,000 mg  1,000 mg Oral BID WC Adonis Brook, NP   1,000 mg at 08/07/15 0805  . mirtazapine (REMERON) tablet 30 mg  30 mg Oral QHS Rachael Fee, MD   30 mg at 08/06/15 2229  . multivitamin with minerals tablet 1 tablet  1 tablet Oral Daily Rachael Fee, MD   1 tablet at 08/07/15 0805  . ondansetron (ZOFRAN-ODT) disintegrating tablet 4 mg  4 mg Oral Q6H PRN Rachael Fee, MD      . pregabalin (LYRICA) capsule 75 mg  75 mg Oral BID Rachael Fee, MD   75 mg at 08/07/15 0805  . thiamine (VITAMIN B-1) tablet 100 mg  100 mg Oral Daily Rachael Fee, MD   100 mg at 08/07/15 0805    Lab Results:  Results for orders placed or performed during the hospital encounter of 08/05/15 (from the past 48 hour(s))  Glucose, capillary     Status: Abnormal   Collection Time: 08/05/15  8:43 PM  Result Value Ref Range   Glucose-Capillary 338 (H) 65 - 99 mg/dL  Glucose,  capillary     Status: Abnormal   Collection Time: 08/06/15  6:06 AM  Result Value Ref Range   Glucose-Capillary 249 (H) 65 - 99 mg/dL  Glucose, capillary     Status: Abnormal   Collection Time: 08/06/15 12:03 PM  Result Value Ref Range   Glucose-Capillary 272 (H) 65 - 99 mg/dL   Comment 1 Notify RN   Glucose, capillary     Status: Abnormal  Collection Time: 08/06/15  5:01 PM  Result Value Ref Range   Glucose-Capillary 241 (H) 65 - 99 mg/dL   Comment 1 Notify RN   Glucose, capillary     Status: Abnormal   Collection Time: 08/06/15  8:59 PM  Result Value Ref Range   Glucose-Capillary 260 (H) 65 - 99 mg/dL   Comment 1 Notify RN    Comment 2 Document in Chart   Glucose, capillary     Status: Abnormal   Collection Time: 08/07/15  5:50 AM  Result Value Ref Range   Glucose-Capillary 333 (H) 65 - 99 mg/dL  Lipid panel     Status: Abnormal   Collection Time: 08/07/15  6:30 AM  Result Value Ref Range   Cholesterol 248 (H) 0 - 200 mg/dL   Triglycerides 161 (H) <150 mg/dL   HDL 83 >09 mg/dL   Total CHOL/HDL Ratio 3.0 RATIO   VLDL 39 0 - 40 mg/dL   LDL Cholesterol 604 (H) 0 - 99 mg/dL    Comment:        Total Cholesterol/HDL:CHD Risk Coronary Heart Disease Risk Table                     Men   Women  1/2 Average Risk   3.4   3.3  Average Risk       5.0   4.4  2 X Average Risk   9.6   7.1  3 X Average Risk  23.4   11.0        Use the calculated Patient Ratio above and the CHD Risk Table to determine the patient's CHD Risk.        ATP III CLASSIFICATION (LDL):  <100     mg/dL   Optimal  540-981  mg/dL   Near or Above                    Optimal  130-159  mg/dL   Borderline  191-478  mg/dL   High  >295     mg/dL   Very High Performed at [facility]   Glucose, capillary     Status: Abnormal   Collection Time: 08/07/15 12:06 PM  Result Value Ref Range   Glucose-Capillary 210 (H) 65 - 99 mg/dL    Blood Alcohol level:  Lab Results  Component Value Date   ETH 108*  08/01/2015   ETH 103* 04/17/2015    Physical Findings: AIMS: Facial and Oral Movements Muscles of Facial Expression: None, normal Lips and Perioral Area: None, normal Jaw: None, normal Tongue: None, normal,Extremity Movements Upper (arms, wrists, hands, fingers): None, normal Lower (legs, knees, ankles, toes): None, normal, Trunk Movements Neck, shoulders, hips: None, normal, Overall Severity Severity of abnormal movements (highest score from questions above): None, normal Incapacitation due to abnormal movements: None, normal Patient's awareness of abnormal movements (rate only patient's report): No Awareness, Dental Status Current problems with teeth and/or dentures?: No Does patient usually wear dentures?: No  CIWA:  CIWA-Ar Total: 4 COWS:  COWS Total Score: 5  Musculoskeletal: Strength & Muscle Tone: within normal limits Gait & Station: normal Patient leans: normal  Psychiatric Specialty Exam: Review of Systems  Constitutional: Negative.   HENT: Negative.   Eyes: Negative.   Respiratory: Negative.   Cardiovascular: Negative.   Gastrointestinal: Negative.   Genitourinary: Negative.   Musculoskeletal: Negative.   Skin: Negative.   Neurological: Negative.   Endo/Heme/Allergies: Negative.   Psychiatric/Behavioral: Positive for depression and  substance abuse. The patient is nervous/anxious and has insomnia.     Blood pressure 125/90, pulse 112, temperature 98.4 F (36.9 C), temperature source Oral, resp. rate 24, height 5\' 2"  (1.575 m), weight 89.359 kg (197 lb), last menstrual period 08/05/2015, SpO2 100 %.Body mass index is 36.02 kg/(m^2).  General Appearance: Fairly Groomed  Patent attorney::  Fair  Speech:  Clear and Coherent  Volume:  Normal  Mood:  Anxious, Depressed and worried  Affect:  anxious worried  Thought Process:  Coherent and Goal Directed  Orientation:  Full (Time, Place, and Person)  Thought Content:  symptoms events worries concerns  Suicidal  Thoughts:  No  Homicidal Thoughts:  No  Memory:  Immediate;   Fair Recent;   Fair Remote;   Fair  Judgement:  Fair  Insight:  Shallow  Psychomotor Activity:  Restlessness  Concentration:  Fair  Recall:  [employer] of Knowledge:Fair  Language: Fair  Akathisia:  No  Handed:  Right  AIMS (if indicated):     Assets:  Desire for Improvement  ADL's:  Intact  Cognition: WNL  Sleep:  Number of Hours: 4.5   Treatment Plan Summary: Daily contact with patient to assess and evaluate symptoms and progress in treatment and Medication management Supportive approach/coping skills Alcohol dependence; continue the Librium detox protocol/work a relapse prevention plan Depression; continue the Remeron 30 mg consider increasing to 45 Hallucinations; increase Abilify to 20 mg Consult Diabetes Coordinator Work with CBT/mindfulness Rachael Fee, MD 08/07/2015, 1:33 PM

## 2015-08-07 NOTE — [facility] Group Notes (Signed)
[facility] LCSW Aftercare Discharge Planning Group Note   08/07/2015 9:23 AM  Participation Quality:  Attended but refused to speak in the group setting. "I don't talk about personal stuff in group." CSW stated that she would check in with pt after treatment team. Pt agreeable to this.  Mood/Affect:  Irritable  Smart, Cleaster Shiffer LCSW

## 2015-08-08 DIAGNOSIS — M792 Neuralgia and neuritis, unspecified: Secondary | ICD-10-CM | POA: Diagnosis present

## 2015-08-08 DIAGNOSIS — F411 Generalized anxiety disorder: Secondary | ICD-10-CM

## 2015-08-08 DIAGNOSIS — J302 Other seasonal allergic rhinitis: Secondary | ICD-10-CM

## 2015-08-08 LAB — GLUCOSE, CAPILLARY
Glucose-Capillary: 333 mg/dL — ABNORMAL HIGH (ref 65–99)
Glucose-Capillary: 137 mg/dL — ABNORMAL HIGH (ref 65–99)
Glucose-Capillary: 348 mg/dL — ABNORMAL HIGH (ref 65–99)
Glucose-Capillary: 132 mg/dL — ABNORMAL HIGH (ref 65–99)

## 2015-08-08 MED ORDER — PREGABALIN 100 MG PO CAPS
100.0000 mg | ORAL_CAPSULE | Freq: Two times a day (BID) | ORAL | Status: DC
  Administered 2015-08-08 – 2015-08-10 (×5): 100 mg via ORAL
  Filled 2015-08-08 (×5): qty 1

## 2015-08-08 MED ORDER — LORATADINE 10 MG PO TABS
10.0000 mg | ORAL_TABLET | Freq: Every day | ORAL | Status: DC
  Administered 2015-08-08 – 2015-08-17 (×10): 10 mg via ORAL
  Filled 2015-08-08 (×12): qty 1

## 2015-08-08 NOTE — Plan of Care (Signed)
Problem: Alteration in mood Goal: LTG-Patient reports reduction in suicidal thoughts (Patient reports reduction in suicidal thoughts and is able to verbalize a safety plan for whenever patient is feeling suicidal)  Outcome: Progressing Pt denies SI at this time     

## 2015-08-08 NOTE — Progress Notes (Signed)
D- Patient is out in milieu interacting with peers. She denies SI and is anticipating a 2/28 d/c date.  Approached another staff member requesting "another nurse" but gave no reason as to request.  Requested diabetes education materials. C/O generalized neuropathic pain. Emotional support offered.  A- Diabetes consult ordered.  NP and patient notified.  Ongoing support offered.  Continue current POC and evaluation of treatment goals.  Continue 15' checks for safety.  R- Safety maintained.

## 2015-08-08 NOTE — Progress Notes (Signed)
D: Pt denies SI/HI/AVH. Pt is pleasant and cooperative. Pt stated she  Was  ok. Pt was accused of being  Flirtatious with another patient.   A: Pt was offered support and encouragement. Pt was given scheduled medications. Pt was encourage to attend groups. Q 15 minute checks were done for safety. Pt informed to be careful what she discusses with other patients and she should only be talking about appropriate things.   R:Pt attends groups and interacts well with peers and staff. Pt is taking medication. Pt has no complaints.Pt receptive to treatment and safety maintained on unit.

## 2015-08-08 NOTE — Progress Notes (Signed)
Pt did not attend AA group this evening.  

## 2015-08-08 NOTE — Progress Notes (Signed)
D: Pt denies SI/HI/AVH. Pt is pleasant and cooperative. Pt stated she did not want a female nurse , so writer ended care after assessment. Pt is known to Clinical research associate from [facility] , but uses another name . Pt appears to only be concerned about her pain medications and pt appears to hospital hop due to her stays at [facility], [facility], [facility], [facility] in the past few months.   A: Pt was offered support and encouragement. Pt was given scheduled medications. Pt was encourage to attend groups. Q 15 minute checks were done for safety.   R: Pt is taking medication. Pt receptive to treatment and safety maintained on unit.

## 2015-08-08 NOTE — Progress Notes (Signed)
[facility] MD Progress Note  08/08/2015 10:39 AM Zitlaly Clyda Smyth  MRN:  [id number]   Subjective:   Patient seems by this provider, case reviewed with social worker and nursing.  On evaluation:  Nahla Lukin reports that the Almyra Deforest is not working for her neuropathic pain.  Has dicussed it being increased with Dr. Dub Mikes.   Rated depression 9/10 and anxiety 10/10.  States there has been no improvement in depression or anxiety since admission.  Denies suicidal/homicidal ideation, and paranoia.  States that there is some improvement in psychosis.  Denies hearing any voices at this time.  Last time heard voices was yesterday. Reports that she is tolerating medications without adverse reaction; sleeping and eating without difficulty; and continues to attend/participate in group sessions.  States that the Librium is helping with withdrawal.     Principal Problem: Depression, major, recurrent, severe with psychosis (HCC) Diagnosis:   Patient Active Problem List   Diagnosis Date Noted  . Neuropathic pain [M79.2] 08/08/2015  . Seasonal allergic rhinitis [J30.2] 08/08/2015  . Anxiety state [F41.1] 08/08/2015  . 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 Diabetes type 1, uncontrolled (HCC) [E10.65] 08/01/2015  . Diabetes mellitus type 1, uncontrolled, insulin dependent (HCC) [E10.65] 08/01/2015  . Alcohol abuse [F10.10]   . Alcohol dependence with alcohol-induced mood disorder (HCC) [F10.24]   . Bipolar disorder, curr episode mixed, severe, with psychotic features (HCC) [F31.64] 05/01/2015  . Uncontrolled diabetes mellitus with neurologic complication, with long-term current use of insulin (HCC) [E11.49, Z79.4, E11.65]   . Uncontrolled diabetes mellitus with diabetic neuropathy, with long-term current use of insulin (HCC) [E11.40, Z79.4, E11.65] 04/21/2015  . PTSD (post-traumatic stress disorder) [F43.10] 04/20/2015  . Alcohol dependence with  withdrawal, uncomplicated (HCC) [F10.230] 04/20/2015  . Suicidal ideation [R45.851] 04/19/2015  . Generalized anxiety disorder [F41.1] 02/13/2013  . Chronic posttraumatic stress disorder [F43.12] 02/13/2013  . Substance induced mood disorder (HCC) [F19.94] 02/08/2013  . IDDM (insulin dependent diabetes mellitus) (HCC) [E11.9, Z79.4] 02/08/2013   Total Time spent with patient: 20 minutes  Past Psychiatric History: see admission H and P  Past Medical History:  Past Medical History  Diagnosis Date  . Mental disorder   . Depression   . Diabetes mellitus without complication (HCC)   . Hypertension     Past Surgical History  Procedure Laterality Date  . Cesarean section     Family History:  Family History  Problem Relation Age of Onset  . Depression Mother    Family Psychiatric  History: see admission H and P Social History:  History  Alcohol Use  . Yes    Comment: daily     History  Drug Use No    Social History   Social History  . Marital Status: Single    Spouse Name: N/A  . Number of Children: N/A  . Years of Education: N/A   Social History Main Topics  . Smoking status: Never Smoker   . Smokeless tobacco: Current User  . Alcohol Use: Yes     Comment: daily  . Drug Use: No  . Sexual Activity: No   Other Topics Concern  . None   Social History Narrative   ** Merged History Encounter **       Additional Social History:    Pain Medications: pt denies abuse Prescriptions: pt denies abuse Over the Counter: pt denies abuse History of alcohol / drug use?: Yes Longest period of sobriety (when/how long): unknown Negative Consequences of  Use: Personal relationships Withdrawal Symptoms: Tremors  Sleep: Good  Appetite:  Good  Current Medications: Current Facility-Administered Medications  Medication Dose Route Frequency Provider Last Rate Last Dose  . acetaminophen (TYLENOL) tablet 650 mg  650 mg Oral Q6H PRN Adonis Brook, NP   650 mg at 08/06/15 2229   . alum & mag hydroxide-simeth (MAALOX/MYLANTA) 200-200-20 MG/5ML suspension 30 mL  30 mL Oral Q4H PRN Adonis Brook, NP      . ARIPiprazole (ABILIFY) tablet 20 mg  20 mg Oral Daily Rachael Fee, MD   20 mg at 08/08/15 0817  . chlordiazePOXIDE (LIBRIUM) capsule 25 mg  25 mg Oral Q6H PRN Rachael Fee, MD      . chlordiazePOXIDE (LIBRIUM) capsule 25 mg  25 mg Oral TID Rachael Fee, MD   25 mg at 08/08/15 0818   Followed by  . chlordiazePOXIDE (LIBRIUM) capsule 25 mg  25 mg Oral BH-qamhs Rachael Fee, MD       Followed by  . [START ON 08/10/2015] chlordiazePOXIDE (LIBRIUM) capsule 25 mg  25 mg Oral Daily Rachael Fee, MD      . cyclobenzaprine (FLEXERIL) tablet 10 mg  10 mg Oral TID PRN Rachael Fee, MD   10 mg at 08/08/15 0545  . fluticasone (FLONASE) 50 MCG/ACT nasal spray 1 spray  1 spray Each Nare Daily PRN Adonis Brook, NP      . gabapentin (NEURONTIN) capsule 900 mg  900 mg Oral TID PC & HS Adonis Brook, NP   900 mg at 08/08/15 0817  . hydrOXYzine (ATARAX/VISTARIL) tablet 25 mg  25 mg Oral Q6H PRN Kerry Hough, PA-C   25 mg at 08/08/15 0545  . ibuprofen (ADVIL,MOTRIN) tablet 800 mg  800 mg Oral Q6H PRN Rachael Fee, MD   800 mg at 08/08/15 0545  . insulin aspart (novoLOG) injection 0-15 Units  0-15 Units Subcutaneous TID WC Sanjuana Kava, NP   11 Units at 08/08/15 0548  . insulin aspart (novoLOG) injection 0-5 Units  0-5 Units Subcutaneous QHS Sanjuana Kava, NP   0 Units at 08/07/15 2200  . insulin aspart (novoLOG) injection 18 Units  18 Units Subcutaneous TID WC Sanjuana Kava, NP   18 Units at 08/08/15 0548  . insulin glargine (LANTUS) injection 60 Units  60 Units Subcutaneous BID Sanjuana Kava, NP   60 Units at 08/08/15 0825  . loperamide (IMODIUM) capsule 2-4 mg  2-4 mg Oral PRN Rachael Fee, MD      . loratadine (CLARITIN) tablet 10 mg  10 mg Oral Daily Shuvon B Rankin, NP      . magnesium hydroxide (MILK OF MAGNESIA) suspension 30 mL  30 mL Oral Daily PRN Adonis Brook, NP       . metFORMIN (GLUCOPHAGE) tablet 1,000 mg  1,000 mg Oral BID WC Adonis Brook, NP   1,000 mg at 08/07/15 1814  . mirtazapine (REMERON) tablet 30 mg  30 mg Oral QHS Rachael Fee, MD   30 mg at 08/07/15 2345  . multivitamin with minerals tablet 1 tablet  1 tablet Oral Daily Rachael Fee, MD   1 tablet at 08/08/15 [phone]  . ondansetron (ZOFRAN-ODT) disintegrating tablet 4 mg  4 mg Oral Q6H PRN Rachael Fee, MD      . pregabalin (LYRICA) capsule 100 mg  100 mg Oral BID Shuvon B Rankin, NP      . thiamine (VITAMIN B-1) tablet 100 mg  100 mg Oral Daily Rachael Fee, MD   100 mg at 08/08/15 5409    Lab Results:  Results for orders placed or performed during the hospital encounter of 08/05/15 (from the past 48 hour(s))  Glucose, capillary     Status: Abnormal   Collection Time: 08/06/15 12:03 PM  Result Value Ref Range   Glucose-Capillary 272 (H) 65 - 99 mg/dL   Comment 1 Notify RN   Glucose, capillary     Status: Abnormal   Collection Time: 08/06/15  5:01 PM  Result Value Ref Range   Glucose-Capillary 241 (H) 65 - 99 mg/dL   Comment 1 Notify RN   Glucose, capillary     Status: Abnormal   Collection Time: 08/06/15  8:59 PM  Result Value Ref Range   Glucose-Capillary 260 (H) 65 - 99 mg/dL   Comment 1 Notify RN    Comment 2 Document in Chart   Glucose, capillary     Status: Abnormal   Collection Time: 08/07/15  5:50 AM  Result Value Ref Range   Glucose-Capillary 333 (H) 65 - 99 mg/dL  Lipid panel     Status: Abnormal   Collection Time: 08/07/15  6:30 AM  Result Value Ref Range   Cholesterol 248 (H) 0 - 200 mg/dL   Triglycerides 811 (H) <150 mg/dL   HDL 83 >91 mg/dL   Total CHOL/HDL Ratio 3.0 RATIO   VLDL 39 0 - 40 mg/dL   LDL Cholesterol 478 (H) 0 - 99 mg/dL    Comment:        Total Cholesterol/HDL:CHD Risk Coronary Heart Disease Risk Table                     Men   Women  1/2 Average Risk   3.4   3.3  Average Risk       5.0   4.4  2 X Average Risk   9.6   7.1  3 X Average  Risk  23.4   11.0        Use the calculated Patient Ratio above and the CHD Risk Table to determine the patient's CHD Risk.        ATP III CLASSIFICATION (LDL):  <100     mg/dL   Optimal  295-621  mg/dL   Near or Above                    Optimal  130-159  mg/dL   Borderline  308-657  mg/dL   High  >846     mg/dL   Very High Performed at [facility]   Glucose, capillary     Status: Abnormal   Collection Time: 08/07/15 12:06 PM  Result Value Ref Range   Glucose-Capillary 210 (H) 65 - 99 mg/dL  Glucose, capillary     Status: Abnormal   Collection Time: 08/07/15  4:32 PM  Result Value Ref Range   Glucose-Capillary 282 (H) 65 - 99 mg/dL  Glucose, capillary     Status: Abnormal   Collection Time: 08/07/15  9:22 PM  Result Value Ref Range   Glucose-Capillary 105 (H) 65 - 99 mg/dL  Glucose, capillary     Status: Abnormal   Collection Time: 08/08/15  5:34 AM  Result Value Ref Range   Glucose-Capillary 333 (H) 65 - 99 mg/dL   Comment 1 Notify RN     Blood Alcohol level:  Lab Results  Component Value Date   ETH 108* 08/01/2015  ETH 103* 04/17/2015    Physical Findings: AIMS: Facial and Oral Movements Muscles of Facial Expression: None, normal Lips and Perioral Area: None, normal Jaw: None, normal Tongue: None, normal,Extremity Movements Upper (arms, wrists, hands, fingers): None, normal Lower (legs, knees, ankles, toes): None, normal, Trunk Movements Neck, shoulders, hips: None, normal, Overall Severity Severity of abnormal movements (highest score from questions above): None, normal Incapacitation due to abnormal movements: None, normal Patient's awareness of abnormal movements (rate only patient's report): No Awareness, Dental Status Current problems with teeth and/or dentures?: No Does patient usually wear dentures?: No  CIWA:  CIWA-Ar Total: 1 COWS:  COWS Total Score: 5  Musculoskeletal: Strength & Muscle Tone: within normal limits Gait & Station:  normal Patient leans: normal  Psychiatric Specialty Exam: Review of Systems  Constitutional: Negative.   HENT: Negative.   Eyes: Negative.   Respiratory: Negative.   Cardiovascular: Negative.   Gastrointestinal: Negative.   Genitourinary: Negative.   Musculoskeletal: Negative.   Skin: Negative.   Neurological: Negative.   Endo/Heme/Allergies: Negative.   Psychiatric/Behavioral: Positive for depression and substance abuse. The patient is nervous/anxious and has insomnia.     Blood pressure 128/96, pulse 114, temperature 97.8 F (36.6 C), temperature source Oral, resp. rate 16, height  (1.575 m), weight 89.359 kg (197 lb), last menstrual period 08/05/2015, SpO2 100 %.Body mass index is 36.02 kg/(m^2).  General Appearance: Fairly Groomed  Patent attorney::  Good  Speech:  Clear and Coherent  Volume:  Normal  Mood:  Anxious and Depressed  Affect:  Anxious  Thought Process:  Coherent and Goal Directed  Orientation:  Full (Time, Place, and Person)  Thought Content:  At this time patient denies hallucinations, delusions, and paranoia   Suicidal Thoughts:  No  Homicidal Thoughts:  No  Memory:  Immediate;   Good Recent;   Good Remote;   Good  Judgement:  Fair  Insight:  Fair  Psychomotor Activity:  Normal  Concentration:  Fair  Recall:  Good  Fund of Knowledge:Fair  Language: Fair  Akathisia:  No  Handed:  Right  AIMS (if indicated):     Assets:  Desire for Improvement  ADL's:  Intact  Cognition: WNL  Sleep:  Number of Hours: 4.75   Treatment Plan Summary: Daily contact with patient to assess and evaluate symptoms and progress in treatment and Medication management   Continue supportive approach/coping skills Alcohol dependence; continue the Librium detox protocol/work a relapse prevention plan Depression; continue the Remeron 30 mg consider increasing to 45.  No increase at this time Hallucinations; Continue Abilify to 20 mg Consult Diabetes Coordinator:   Ordered Neuropathic Pain:  Increased Lyric to 100 mg Bid; Continue Neurontin 900 mg Tid Anxiety:  Continue Vistaril 25 mg Q 6 hr Work with CBT/mindfulness Seasonal Allergic Rhinitis: Started Claritin 10 mg daily  Improvement with hallucinations, Denies improvement with anxiety and depression since admission.  New complaint of nasal congestion and needing something for seasonal allergies; and denies improvement with neuropathic pain. Does states some improvement with withdrawal symptoms.    Rankin, Shuvon, NP 08/08/2015, 10:39 AM

## 2015-08-08 NOTE — Plan of Care (Signed)
Problem: Diagnosis: Increased Risk For Suicide Attempt Goal: LTG-Patient Will Report Absence of Withdrawal Symptoms LTG (by discharge): Patient will report absence of withdrawal symptoms.  Outcome: Not Progressing Patient shows no outward signed of withdrawal symptoms.  Continues to request librium every 6 hours.

## 2015-08-08 NOTE — [facility] Group Notes (Signed)
[facility] LCSW Group Therapy  08/08/2015 10:00 AM   Type of Therapy:  Group Therapy  Participation Level:  Did Not Attend - pt intended to attend group but was pulled out by NP for the duration of group.  Pt returned at the very end of group.  Reyes Ivan, LCSW 08/08/2015 12:46 PM

## 2015-08-09 LAB — GLUCOSE, CAPILLARY
Glucose-Capillary: 145 mg/dL — ABNORMAL HIGH (ref 65–99)
Glucose-Capillary: 71 mg/dL (ref 65–99)
Glucose-Capillary: 382 mg/dL — ABNORMAL HIGH (ref 65–99)
Glucose-Capillary: 69 mg/dL (ref 65–99)
Glucose-Capillary: 84 mg/dL (ref 65–99)
Glucose-Capillary: 177 mg/dL — ABNORMAL HIGH (ref 65–99)

## 2015-08-09 MED ORDER — INSULIN GLARGINE 100 UNIT/ML ~~LOC~~ SOLN
30.0000 [IU] | Freq: Once | SUBCUTANEOUS | Status: AC
  Administered 2015-08-09: 30 [IU] via SUBCUTANEOUS

## 2015-08-09 NOTE — Progress Notes (Signed)
[facility] MD Progress Note  08/09/2015 1:16 PM Carolyn Ayala  MRN:  [id number]   Subjective:  "I'm doing all right" Patient seems by this provider, case reviewed with social worker and nursing.  On evaluation:  Carolyn Ayala reports that she is feeling better today.  Continues to attend/participate in group session; but, states that she is disappointed about the group sessions because there is no interaction really.  "They give you a pamphlet and talk about goals and that is it.  How are you going to set goals when you ain't dealt with what got you here in the first place."  Patient states that she is tolerating her medications without adverse reaction; and that she is sleeping and eating without difficulty.   At this time denies suicidal/homicidal ideation, psychosis, and paranoia.  Continues to endorse depression and anxiety but states much better than yesterday.     Principal Problem: Depression, major, recurrent, severe with psychosis (HCC) Diagnosis:   Patient Active Problem List   Diagnosis Date Noted  . Neuropathic pain [M79.2] 08/08/2015  . Seasonal allergic rhinitis [J30.2] 08/08/2015  . Anxiety state [F41.1] 08/08/2015  . 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 Diabetes type 1, uncontrolled (HCC) [E10.65] 08/01/2015  . Diabetes mellitus type 1, uncontrolled, insulin dependent (HCC) [E10.65] 08/01/2015  . Alcohol abuse [F10.10]   . Alcohol dependence with alcohol-induced mood disorder (HCC) [F10.24]   . Bipolar disorder, curr episode mixed, severe, with psychotic features (HCC) [F31.64] 05/01/2015  . Uncontrolled diabetes mellitus with neurologic complication, with long-term current use of insulin (HCC) [E11.49, Z79.4, E11.65]   . Uncontrolled diabetes mellitus with diabetic neuropathy, with long-term current use of insulin (HCC) [E11.40, Z79.4, E11.65] 04/21/2015  . PTSD (post-traumatic stress disorder) [F43.10] 04/20/2015   . Alcohol dependence with withdrawal, uncomplicated (HCC) [F10.230] 04/20/2015  . Suicidal ideation [R45.851] 04/19/2015  . Generalized anxiety disorder [F41.1] 02/13/2013  . Chronic posttraumatic stress disorder [F43.12] 02/13/2013  . Substance induced mood disorder (HCC) [F19.94] 02/08/2013  . IDDM (insulin dependent diabetes mellitus) (HCC) [E11.9, Z79.4] 02/08/2013   Total Time spent with patient: 15 minutes  Past Psychiatric History: see admission H and P  Past Medical History:  Past Medical History  Diagnosis Date  . Mental disorder   . Depression   . Diabetes mellitus without complication (HCC)   . Hypertension     Past Surgical History  Procedure Laterality Date  . Cesarean section     Family History:  Family History  Problem Relation Age of Onset  . Depression Mother    Family Psychiatric  History: see admission H and P Social History:  History  Alcohol Use  . Yes    Comment: daily     History  Drug Use No    Social History   Social History  . Marital Status: Single    Spouse Name: N/A  . Number of Children: N/A  . Years of Education: N/A   Social History Main Topics  . Smoking status: Never Smoker   . Smokeless tobacco: Current User  . Alcohol Use: Yes     Comment: daily  . Drug Use: No  . Sexual Activity: No   Other Topics Concern  . None   Social History Narrative   ** Merged History Encounter **       Additional Social History:    Pain Medications: pt denies abuse Prescriptions: pt denies abuse Over the Counter: pt denies abuse History of alcohol / drug  use?: Yes Longest period of sobriety (when/how long): unknown Negative Consequences of Use: Personal relationships Withdrawal Symptoms: Tremors  Sleep: Good  Appetite:  Good  Current Medications: Current Facility-Administered Medications  Medication Dose Route Frequency Provider Last Rate Last Dose  . acetaminophen (TYLENOL) tablet 650 mg  650 mg Oral Q6H PRN Adonis Brook,  NP   650 mg at 08/06/15 2229  . alum & mag hydroxide-simeth (MAALOX/MYLANTA) 200-200-20 MG/5ML suspension 30 mL  30 mL Oral Q4H PRN Adonis Brook, NP      . ARIPiprazole (ABILIFY) tablet 20 mg  20 mg Oral Daily Rachael Fee, MD   20 mg at 08/09/15 [phone]  . chlordiazePOXIDE (LIBRIUM) capsule 25 mg  25 mg Oral Q6H PRN Rachael Fee, MD   25 mg at 08/08/15 1455  . [START ON 08/10/2015] chlordiazePOXIDE (LIBRIUM) capsule 25 mg  25 mg Oral Daily Rachael Fee, MD      . cyclobenzaprine (FLEXERIL) tablet 10 mg  10 mg Oral TID PRN Rachael Fee, MD   10 mg at 08/09/15 9604  . fluticasone (FLONASE) 50 MCG/ACT nasal spray 1 spray  1 spray Each Nare Daily PRN Adonis Brook, NP      . gabapentin (NEURONTIN) capsule 900 mg  900 mg Oral TID PC & HS Adonis Brook, NP   900 mg at 08/09/15 0811  . hydrOXYzine (ATARAX/VISTARIL) tablet 25 mg  25 mg Oral Q6H PRN Kerry Hough, PA-C   25 mg at 08/08/15 2125  . ibuprofen (ADVIL,MOTRIN) tablet 800 mg  800 mg Oral Q6H PRN Rachael Fee, MD   800 mg at 08/09/15 5409  . insulin aspart (novoLOG) injection 0-15 Units  0-15 Units Subcutaneous TID WC Sanjuana Kava, NP   2 Units at 08/09/15 [phone]  . insulin aspart (novoLOG) injection 0-5 Units  0-5 Units Subcutaneous QHS Sanjuana Kava, NP   0 Units at 08/07/15 2200  . insulin aspart (novoLOG) injection 18 Units  18 Units Subcutaneous TID WC Sanjuana Kava, NP   18 Units at 08/09/15 [phone]  . insulin glargine (LANTUS) injection 60 Units  60 Units Subcutaneous BID Sanjuana Kava, NP   60 Units at 08/09/15 [phone]  . loperamide (IMODIUM) capsule 2-4 mg  2-4 mg Oral PRN Rachael Fee, MD      . loratadine (CLARITIN) tablet 10 mg  10 mg Oral Daily Shuvon B Rankin, NP   10 mg at 08/09/15 0811  . magnesium hydroxide (MILK OF MAGNESIA) suspension 30 mL  30 mL Oral Daily PRN Adonis Brook, NP      . metFORMIN (GLUCOPHAGE) tablet 1,000 mg  1,000 mg Oral BID WC Adonis Brook, NP   1,000 mg at 08/09/15 0810  . mirtazapine (REMERON) tablet 30  mg  30 mg Oral QHS Rachael Fee, MD   30 mg at 08/08/15 2227  . multivitamin with minerals tablet 1 tablet  1 tablet Oral Daily Rachael Fee, MD   1 tablet at 08/09/15 [phone]  . ondansetron (ZOFRAN-ODT) disintegrating tablet 4 mg  4 mg Oral Q6H PRN Rachael Fee, MD      . pregabalin (LYRICA) capsule 100 mg  100 mg Oral BID Shuvon B Rankin, NP   100 mg at 08/09/15 0811  . thiamine (VITAMIN B-1) tablet 100 mg  100 mg Oral Daily Rachael Fee, MD   100 mg at 08/09/15 8657    Lab Results:  Results for orders placed or performed during the hospital  encounter of 08/05/15 (from the past 48 hour(s))  Glucose, capillary     Status: Abnormal   Collection Time: 08/07/15  4:32 PM  Result Value Ref Range   Glucose-Capillary 282 (H) 65 - 99 mg/dL  Glucose, capillary     Status: Abnormal   Collection Time: 08/07/15  9:22 PM  Result Value Ref Range   Glucose-Capillary 105 (H) 65 - 99 mg/dL  Glucose, capillary     Status: Abnormal   Collection Time: 08/08/15  5:34 AM  Result Value Ref Range   Glucose-Capillary 333 (H) 65 - 99 mg/dL   Comment 1 Notify RN   Glucose, capillary     Status: Abnormal   Collection Time: 08/08/15 11:54 AM  Result Value Ref Range   Glucose-Capillary 137 (H) 65 - 99 mg/dL  Glucose, capillary     Status: Abnormal   Collection Time: 08/08/15  4:40 PM  Result Value Ref Range   Glucose-Capillary 348 (H) 65 - 99 mg/dL  Glucose, capillary     Status: Abnormal   Collection Time: 08/08/15  8:28 PM  Result Value Ref Range   Glucose-Capillary 132 (H) 65 - 99 mg/dL  Glucose, capillary     Status: Abnormal   Collection Time: 08/09/15  6:25 AM  Result Value Ref Range   Glucose-Capillary 145 (H) 65 - 99 mg/dL  Glucose, capillary     Status: None   Collection Time: 08/09/15 12:04 PM  Result Value Ref Range   Glucose-Capillary 71 65 - 99 mg/dL    Blood Alcohol level:  Lab Results  Component Value Date   ETH 108* 08/01/2015   ETH 103* 04/17/2015    Physical Findings: AIMS:  Facial and Oral Movements Muscles of Facial Expression: None, normal Lips and Perioral Area: None, normal Jaw: None, normal Tongue: None, normal,Extremity Movements Upper (arms, wrists, hands, fingers): None, normal Lower (legs, knees, ankles, toes): None, normal, Trunk Movements Neck, shoulders, hips: None, normal, Overall Severity Severity of abnormal movements (highest score from questions above): None, normal Incapacitation due to abnormal movements: None, normal Patient's awareness of abnormal movements (rate only patient's report): No Awareness, Dental Status Current problems with teeth and/or dentures?: No Does patient usually wear dentures?: No  CIWA:  CIWA-Ar Total: 0 COWS:  COWS Total Score: 5  Musculoskeletal: Strength & Muscle Tone: within normal limits Gait & Station: normal Patient leans: normal  Psychiatric Specialty Exam: Review of Systems  Psychiatric/Behavioral: Positive for depression (Improving) and substance abuse. Suicidal ideas: Denies. The patient is nervous/anxious (Improving) and has insomnia (Improving).   All other systems reviewed and are negative.   Blood pressure 112/81, pulse 109, temperature 97.8 F (36.6 C), temperature source Oral, resp. rate 16, height  (1.575 m), weight 89.359 kg (197 lb), last menstrual period 08/05/2015, SpO2 100 %.Body mass index is 36.02 kg/(m^2).  General Appearance: Casual and Fairly Groomed  Eye Contact::  Good  Speech:  Clear and Coherent  Volume:  Normal  Mood:  Anxious and Depressed  Affect:  Anxious  Thought Process:  Coherent and Goal Directed  Orientation:  Full (Time, Place, and Person)  Thought Content:  Continues to deny hallucinations, delusions, and paranoia   Suicidal Thoughts:  No  Homicidal Thoughts:  No  Memory:  Immediate;   Good Recent;   Good Remote;   Good  Judgement:  Fair  Insight:  Fair  Psychomotor Activity:  Normal  Concentration:  Fair  Recall:  Good  Fund of Knowledge:Fair   Language: Fair  Akathisia:  No  Handed:  Right  AIMS (if indicated):     Assets:  Communication Skills Desire for Improvement  ADL's:  Intact  Cognition: WNL  Sleep:  Number of Hours: 5   Treatment Plan Summary: Daily contact with patient to assess and evaluate symptoms and progress in treatment and Medication management   Continue supportive approach/coping skills Alcohol dependence; continue the Librium detox protocol/work a relapse prevention plan Depression; continue the Remeron 30 mg consider increasing to 45.  No increase at this time Hallucinations; Continue Abilify to 20 mg Consult Diabetes Coordinator:  Recommendations reviewed; Order nutritional consult.  Patient wanted education on how to eat once discharged Neuropathic Pain: Improving; Continue Lyric to 100 mg Bid; Continue Neurontin 900 mg Tid Anxiety:  Continue Vistaril 25 mg Q 6 hr Work with CBT/mindfulness Seasonal Allergic Rhinitis: Improved; Continue Claritin 10 mg daily   Continues to deny hallucinations.  Improvement in Neuropathic pain and sinus congestion. Moderate Improvement with depression and anxiety.  No complaints of withdrawal symptoms.  Patient also interested in talking with a nutritionist prior to discharge related to being a new diabetic.    Rankin, Shuvon, NP 08/09/2015, 1:16 PM

## 2015-08-09 NOTE — Progress Notes (Addendum)
Patient ID: Nevena Rozenberg, female   DOB: 1976/04/04, 40 y.o.   MRN: [id number] Adult Psychoeducational Group Note  Date: 08/08/2015 Time: 1:15pm   Group Topic/Focus:  Healthy Communication:   The focus of this group is to discuss communication, barriers to communication, as well as healthy ways to communicate with others.  Participation Level:  Active  Participation Quality:  Appropriate  Affect:  Appropriate  Cognitive:  Alert and Oriented  Insight: Appropriate  Engagement in Group:  Engaged  Modes of Intervention:  Activity, Discussion, Education and Support  Additional Comments:

## 2015-08-09 NOTE — Progress Notes (Signed)
Hypoglycemic Event  CBG: 69  Treatment: 15 GM carbohydrate snack  Symptoms: None  Follow-up CBG: Time:2118 CBG Result:69  Possible Reasons for Event: Unknown  Comments/MD notified:Ijeoma, NP Adult (Non-Pregnant) Hypoglycemia Standing Orders  1.  RN shall initiate Hypoglycemia Standing Orders for emergency measures immediately when:            w Routine or STAT CBG and/or a lab glucose indicates hypoglycemia (CBG < 70 mg/dl)  2.  Treat the patient according to ability to take PO's and severity of hypoglycemia.   3.  If patient is on GlucoStabilizer, follow directions provided by the [facility] for hypoglycemic events.  4.  If patient on insulin pump, follow Hypoglycemia Standing Orders.  If patient requires more than one treatment have patient place pump in SUSPEND and notify MD.  DO NOT leave pump in SUSPEND for greater than 30 minutes unless ordered by MD.  A.  Treatment for Mild or Moderate-Patient cooperative and able to swallow    1.  Patient taking PO's and can cooperate   a.  Give one of the following 15 gram CHO options:                           w 1 tube oral dextrose gel                           w 3-4 Glucose tablets                           w 4 oz. Juice                           w 4 oz. regular soda                                    ESRD patients:  clear, regular soda                           w 8 oz. skim milk    b.  Recheck CBG in 15 minutes after treatment                            w If CBG < 70 mg/dl, repeat treatment and recheck until hypoglycemia is resolved                            w If CBG > 70 mg/dl and next meal is more than 1 hour away, give additional 15 grams CHO   2.  Patient NPO-Patient cooperative and no altered mental status    a.  Give 25 ml of D50 IV.   b.  Recheck CBG in 15 minutes after treatment.                             w If CBG is less than 70 mg/dl, repeat treatment and recheck until hypoglycemia is resolved.   c.  Notify  MD for further orders.             SPECIAL CONSIDERATIONS:    a.  If no IV access,  w Start IV of D5W at [facility]                             w Give 25 ml of D50 IV.    b.  If unable to gain IV access                             w Give Glucagon IM:    i.  1 mg if patient weighs more than 45.5 kg    ii.  0.5 mg if patient weighs less than 45.5 kg   c.  Notify MD for further orders  B.  Treatment for Severe-- Patient unconscious or unable to take PO's safely    1.  Position patient on side   2.  Give 50 ml D50 IV   3.  Recheck CBG in 15 minutes.                    w If CBG is less than 70 mg/dl, repeat treatment and recheck until hypoglycemia is resolved.   4.  Notify MD for further orders.    SPECIAL CONSIDERATIONS:    a.  If no IV access                              w Give Glucagon IM                                        i.  1 mg if patient weighs more than 45.5 kg                                       ii.  0.5 mg if patient weighs less than 45.5 kg                              w Start IV of D5W at 50 ml/hr and give 50 ml D50 IV   b.  If no IV access and active seizure                               w Call Rapid Response   c.  If unable to gain IV access, give Glucagon IM:                              w 1 mg if patient weighs more than 45.5 kg                              w 0.5 mg if patient weighs less than 45.5 kg   d.  Notify MD for further orders.  C.  Complete smart text progress note to document intervention and follow-up CBG   1.  In [facility] patient chart, click on Notes (left side of screen)   2.  Create Progress Note   3.  Click on [employer].  In the Match box type "hypo" and enter    4.  Double click on [facility] IP HYPOGLYCEMIC EVENT and enter data   5.  MD must be notified if patient is NPO or experienced severe hypoglycemia   Laddie Naeem, [employer]

## 2015-08-09 NOTE — Progress Notes (Signed)
Patient in day room watching TV at the beginning of the shift. Her mood and affects sad and depressed. Reported that she had a very depressed day. Writer encouraged and supported patient. Patient endorsed passive SI but verbally contracted for safety. Writer also did some teaching about patient diabetic diet and meal choices. Encouraged patient to drink diet ginger ale instead of regular one or juice, less sugar and less carbohydrate and protein snacks. Patient receptive to encouragement and support. Q15 minute check continues as ordered for safety

## 2015-08-09 NOTE — Progress Notes (Signed)
D: Pt presents with flat affect and anxious mood. Pt rates depression 9/10. Hopeless 8/10. Anxiety 10/10. Pt reports withdrawal symptoms of cravings, agitation, tactile hallucinations and irritability. Pt endorses passive suicidal thoughts with out a plan. Pt denies active suicidal thoughts. Pt verbally contracts for safety. Pt appears disheveled throughout the day. Pt requested go have four digit code number changed, stating that someone for her past continues to call her and she doesn't want to go down that road again of using drugs. A: Medications reviewed with pt. Medications administered as ordered per MD. Verbal support provided. Pt encouraged to attend groups. R: Pt stated goal "to heal better and to calm down". Pt receptive to tx.

## 2015-08-09 NOTE — [facility] Group Notes (Signed)
[facility] LCSW Group Therapy  08/09/2015   10:00 AM   Type of Therapy:  Group Therapy  Participation Level:  Minimal  Participation Quality:  Appropriate and Attentive  Affect:  Irritable  Cognitive:  Alert and Appropriate  Insight:  Limited, lacking  Engagement in Therapy:  Limited, lacking  Modes of Intervention:  Clarification, Confrontation, Discussion, Education, Exploration, Limit-setting, Orientation, Problem-solving, Rapport Building, Dance movement psychotherapist, Socialization and Support  Summary of Progress/Problems: The main focus of today's process group was to identify the patient's current support system and decide on other supports that can be put in place.  An emphasis was placed on using counselor, doctor, therapy groups, 12-step groups, and problem-specific support groups to expand supports, as well as doing something different than has been done before. Pt appeared attentive to group discussion and did not share anything, but waited until towards the end of group discussion to ask what coping skills CSW could offer to her to stop drinking.  CSW reviewed what was discussed during group discussion today and suggested distractions, avoiding isolation and changing habits.  Pt became irritable and said she had nothing else to say.      Reyes Ivan, LCSW 08/09/2015 1:48 PM

## 2015-08-10 DIAGNOSIS — F10230 Alcohol dependence with withdrawal, uncomplicated: Secondary | ICD-10-CM

## 2015-08-10 DIAGNOSIS — Z794 Long term (current) use of insulin: Secondary | ICD-10-CM

## 2015-08-10 DIAGNOSIS — R45851 Suicidal ideations: Secondary | ICD-10-CM

## 2015-08-10 DIAGNOSIS — F333 Major depressive disorder, recurrent, severe with psychotic symptoms: Principal | ICD-10-CM

## 2015-08-10 DIAGNOSIS — E785 Hyperlipidemia, unspecified: Secondary | ICD-10-CM | POA: Clinically undetermined

## 2015-08-10 DIAGNOSIS — F1020 Alcohol dependence, uncomplicated: Secondary | ICD-10-CM | POA: Diagnosis present

## 2015-08-10 DIAGNOSIS — E119 Type 2 diabetes mellitus without complications: Secondary | ICD-10-CM

## 2015-08-10 LAB — GLUCOSE, CAPILLARY
Glucose-Capillary: 379 mg/dL — ABNORMAL HIGH (ref 65–99)
Glucose-Capillary: 416 mg/dL — ABNORMAL HIGH (ref 65–99)
Glucose-Capillary: 321 mg/dL — ABNORMAL HIGH (ref 65–99)
Glucose-Capillary: 274 mg/dL — ABNORMAL HIGH (ref 65–99)
Glucose-Capillary: 269 mg/dL — ABNORMAL HIGH (ref 65–99)
Glucose-Capillary: 133 mg/dL — ABNORMAL HIGH (ref 65–99)

## 2015-08-10 MED ORDER — LITHIUM CARBONATE 150 MG PO CAPS
150.0000 mg | ORAL_CAPSULE | Freq: Two times a day (BID) | ORAL | Status: DC
  Administered 2015-08-10 – 2015-08-14 (×9): 150 mg via ORAL
  Filled 2015-08-10 (×12): qty 1

## 2015-08-10 MED ORDER — GLUCERNA SHAKE PO LIQD
237.0000 mL | Freq: Three times a day (TID) | ORAL | Status: DC
  Administered 2015-08-10 – 2015-08-13 (×8): 237 mL via ORAL

## 2015-08-10 MED ORDER — INSULIN ASPART 100 UNIT/ML ~~LOC~~ SOLN
12.0000 [IU] | Freq: Two times a day (BID) | SUBCUTANEOUS | Status: DC
  Administered 2015-08-11: 12 [IU] via SUBCUTANEOUS

## 2015-08-10 MED ORDER — PREGABALIN 75 MG PO CAPS
150.0000 mg | ORAL_CAPSULE | Freq: Two times a day (BID) | ORAL | Status: DC
  Administered 2015-08-10 – 2015-08-12 (×4): 150 mg via ORAL
  Filled 2015-08-10 (×4): qty 2

## 2015-08-10 MED ORDER — SIMVASTATIN 20 MG PO TABS
20.0000 mg | ORAL_TABLET | Freq: Every day | ORAL | Status: DC
  Administered 2015-08-10 – 2015-08-16 (×7): 20 mg via ORAL
  Filled 2015-08-10 (×8): qty 1

## 2015-08-10 NOTE — [facility] Group Notes (Signed)
[facility] LCSW Aftercare Discharge Planning Group Note  08/10/2015 8:45 AM  Pt attended but stated, "I'm fine, skip me."  Chad Cordial, LCSWA 08/10/2015 2:11 PM

## 2015-08-10 NOTE — Progress Notes (Signed)
Patient in her room during the assessment. She talked about the Hospitalist visit and her medication changes. She reported that the doctor started her on Lithium and she was told that she would have to stay for at least 5 more days to evaluate the medication therapeutic level before she would be discharged. Her mood and affects labile and she remains easily irritable. She denied SI/HI and denied Hallucinations. Writer encouraged and supported patient. She continues to focus on getting more meals before taking her bed time insulin. Writer encouraged and supported patient. Q 15 minute checks continues as ordered for safety.

## 2015-08-10 NOTE — Progress Notes (Signed)
[facility] MD Progress Note  08/10/2015 12:49 PM Carolyn Ayala  MRN:  [id number]   Subjective: Pt states " I still feel suicidal on and off . I always struggle with the thought that I am not good to be alive, there is nothing in life anymore. "  Objective:Ellorie Lacie Landry is a 40 y.o.AA Female [place] who is single , unemployed , who was admitted to [facility] adult unit from the medical floor of the [facility] with complaints of suicide attempt by overdose on insulin, Metformin & alcohol intoxication. She was recently discharged from this hospital on 07-09-15 after receiving alcohol detoxification & mood stabilization treatments. She was discharged to follow-up care at the [facility] clinic both in [place], [place].   Patient seen and chart reviewed.Discussed patient with treatment team.  Pt today seen as depressed, continues to ruminate on negative thoughts that are distressing to her. Pt continues to have  SI . Pt reports she took a long acting insulin and that is why she did not die and that she knows now to take a short acting one since that may actually kill her. Pt also reports chronic pain sx. Pt tolerating medications well. Per staff - pt continues to be labile, lack insight, judgement , is seen as hoarding sugar and other high carb food in her room and does not watch her diet, which seems to make her CBGs worse. Will continue to encourage and support.     Principal Problem: Depression, major, recurrent, severe with psychosis (HCC) Diagnosis:   Patient Active Problem List   Diagnosis Date Noted  . Neuropathic pain [M79.2] 08/08/2015  . Seasonal allergic rhinitis [J30.2] 08/08/2015  . Anxiety state [F41.1] 08/08/2015  . 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 Diabetes type 1, uncontrolled (HCC) [E10.65] 08/01/2015  . Diabetes mellitus type 1, uncontrolled, insulin dependent (HCC) [E10.65] 08/01/2015  . Alcohol abuse  [F10.10]   . Alcohol dependence with alcohol-induced mood disorder (HCC) [F10.24]   . Bipolar disorder, curr episode mixed, severe, with psychotic features (HCC) [F31.64] 05/01/2015  . Uncontrolled diabetes mellitus with neurologic complication, with long-term current use of insulin (HCC) [E11.49, Z79.4, E11.65]   . Uncontrolled diabetes mellitus with diabetic neuropathy, with long-term current use of insulin (HCC) [E11.40, Z79.4, E11.65] 04/21/2015  . PTSD (post-traumatic stress disorder) [F43.10] 04/20/2015  . Alcohol dependence with withdrawal, uncomplicated (HCC) [F10.230] 04/20/2015  . Suicidal ideation [R45.851] 04/19/2015  . Generalized anxiety disorder [F41.1] 02/13/2013  . Chronic posttraumatic stress disorder [F43.12] 02/13/2013  . Substance induced mood disorder (HCC) [F19.94] 02/08/2013  . IDDM (insulin dependent diabetes mellitus) (HCC) [E11.9, Z79.4] 02/08/2013   Total Time spent with patient: 30 minutes  Past Psychiatric History: see admission H and P  Past Medical History:  Past Medical History  Diagnosis Date  . Mental disorder   . Depression   . Diabetes mellitus without complication (HCC)   . Hypertension     Past Surgical History  Procedure Laterality Date  . Cesarean section     Family History:  Family History  Problem Relation Age of Onset  . Depression Mother    Family Psychiatric  History: see admission H and P Social History:  History  Alcohol Use  . Yes    Comment: daily     History  Drug Use No    Social History   Social History  . Marital Status: Single    Spouse Name: N/A  . Number of Children: N/A  .  Years of Education: N/A   Social History Main Topics  . Smoking status: Never Smoker   . Smokeless tobacco: Current User  . Alcohol Use: Yes     Comment: daily  . Drug Use: No  . Sexual Activity: No   Other Topics Concern  . None   Social History Narrative   ** Merged History Encounter **       Additional Social History:     Pain Medications: pt denies abuse Prescriptions: pt denies abuse Over the Counter: pt denies abuse History of alcohol / drug use?: Yes Longest period of sobriety (when/how long): unknown Negative Consequences of Use: Personal relationships Withdrawal Symptoms: Tremors  Sleep: Good  Appetite:  Good  Current Medications: Current Facility-Administered Medications  Medication Dose Route Frequency Provider Last Rate Last Dose  . acetaminophen (TYLENOL) tablet 650 mg  650 mg Oral Q6H PRN Adonis Brook, NP   650 mg at 08/06/15 2229  . alum & mag hydroxide-simeth (MAALOX/MYLANTA) 200-200-20 MG/5ML suspension 30 mL  30 mL Oral Q4H PRN Adonis Brook, NP      . ARIPiprazole (ABILIFY) tablet 20 mg  20 mg Oral Daily Rachael Fee, MD   20 mg at 08/10/15 0831  . cyclobenzaprine (FLEXERIL) tablet 10 mg  10 mg Oral TID PRN Rachael Fee, MD   10 mg at 08/10/15 0831  . feeding supplement (GLUCERNA SHAKE) (GLUCERNA SHAKE) liquid 237 mL  237 mL Oral TID BM Maricela Kawahara, MD   237 mL at 08/10/15 1101  . fluticasone (FLONASE) 50 MCG/ACT nasal spray 1 spray  1 spray Each Nare Daily PRN Adonis Brook, NP      . gabapentin (NEURONTIN) capsule 900 mg  900 mg Oral TID PC & HS Adonis Brook, NP   900 mg at 08/10/15 0830  . hydrOXYzine (ATARAX/VISTARIL) tablet 25 mg  25 mg Oral Q6H PRN Kerry Hough, PA-C   25 mg at 08/10/15 1106  . ibuprofen (ADVIL,MOTRIN) tablet 800 mg  800 mg Oral Q6H PRN Rachael Fee, MD   800 mg at 08/10/15 0831  . insulin aspart (novoLOG) injection 0-15 Units  0-15 Units Subcutaneous TID WC Sanjuana Kava, NP   Stopped at 08/10/15 0700  . insulin aspart (novoLOG) injection 0-5 Units  0-5 Units Subcutaneous QHS Sanjuana Kava, NP   0 Units at 08/07/15 2200  . insulin aspart (novoLOG) injection 18 Units  18 Units Subcutaneous TID WC Sanjuana Kava, NP   18 Units at 08/10/15 1037  . insulin glargine (LANTUS) injection 60 Units  60 Units Subcutaneous BID Sanjuana Kava, NP   60 Units at  08/10/15 1021  . lithium carbonate capsule 150 mg  150 mg Oral BID WC Ymani Porcher, MD   150 mg at 08/10/15 1106  . loratadine (CLARITIN) tablet 10 mg  10 mg Oral Daily Shuvon B Rankin, NP   10 mg at 08/10/15 0831  . magnesium hydroxide (MILK OF MAGNESIA) suspension 30 mL  30 mL Oral Daily PRN Adonis Brook, NP      . metFORMIN (GLUCOPHAGE) tablet 1,000 mg  1,000 mg Oral BID WC Adonis Brook, NP   1,000 mg at 08/10/15 0831  . mirtazapine (REMERON) tablet 30 mg  30 mg Oral QHS Rachael Fee, MD   30 mg at 08/09/15 2229  . multivitamin with minerals tablet 1 tablet  1 tablet Oral Daily Rachael Fee, MD   1 tablet at 08/10/15 0830  . pregabalin (LYRICA) capsule 150  mg  150 mg Oral BID Jomarie Longs, MD      . simvastatin (ZOCOR) tablet 20 mg  20 mg Oral q1800 Jomarie Longs, MD      . thiamine (VITAMIN B-1) tablet 100 mg  100 mg Oral Daily Rachael Fee, MD   100 mg at 08/10/15 0831    Lab Results:  Results for orders placed or performed during the hospital encounter of 08/05/15 (from the past 48 hour(s))  Glucose, capillary     Status: Abnormal   Collection Time: 08/08/15  4:40 PM  Result Value Ref Range   Glucose-Capillary 348 (H) 65 - 99 mg/dL  Glucose, capillary     Status: Abnormal   Collection Time: 08/08/15  8:28 PM  Result Value Ref Range   Glucose-Capillary 132 (H) 65 - 99 mg/dL  Glucose, capillary     Status: Abnormal   Collection Time: 08/09/15  6:25 AM  Result Value Ref Range   Glucose-Capillary 145 (H) 65 - 99 mg/dL  Glucose, capillary     Status: None   Collection Time: 08/09/15 12:04 PM  Result Value Ref Range   Glucose-Capillary 71 65 - 99 mg/dL  Glucose, capillary     Status: Abnormal   Collection Time: 08/09/15  4:51 PM  Result Value Ref Range   Glucose-Capillary 382 (H) 65 - 99 mg/dL  Glucose, capillary     Status: None   Collection Time: 08/09/15  8:53 PM  Result Value Ref Range   Glucose-Capillary 69 65 - 99 mg/dL  Glucose, capillary     Status: None    Collection Time: 08/09/15  9:18 PM  Result Value Ref Range   Glucose-Capillary 84 65 - 99 mg/dL  Glucose, capillary     Status: Abnormal   Collection Time: 08/09/15 10:26 PM  Result Value Ref Range   Glucose-Capillary 177 (H) 65 - 99 mg/dL  Glucose, capillary     Status: Abnormal   Collection Time: 08/10/15  6:14 AM  Result Value Ref Range   Glucose-Capillary 379 (H) 65 - 99 mg/dL  Glucose, capillary     Status: Abnormal   Collection Time: 08/10/15 10:14 AM  Result Value Ref Range   Glucose-Capillary 416 (H) 65 - 99 mg/dL  Glucose, capillary     Status: Abnormal   Collection Time: 08/10/15 11:43 AM  Result Value Ref Range   Glucose-Capillary 321 (H) 65 - 99 mg/dL   Comment 1 Notify RN    Comment 2 Document in Chart     Blood Alcohol level:  Lab Results  Component Value Date   ETH 108* 08/01/2015   ETH 103* 04/17/2015    Physical Findings: AIMS: Facial and Oral Movements Muscles of Facial Expression: None, normal Lips and Perioral Area: None, normal Jaw: None, normal Tongue: None, normal,Extremity Movements Upper (arms, wrists, hands, fingers): None, normal Lower (legs, knees, ankles, toes): None, normal, Trunk Movements Neck, shoulders, hips: None, normal, Overall Severity Severity of abnormal movements (highest score from questions above): None, normal Incapacitation due to abnormal movements: None, normal Patient's awareness of abnormal movements (rate only patient's report): No Awareness, Dental Status Current problems with teeth and/or dentures?: No Does patient usually wear dentures?: No  CIWA:  CIWA-Ar Total: 0 COWS:  COWS Total Score: 5  Musculoskeletal: Strength & Muscle Tone: within normal limits Gait & Station: normal Patient leans: normal  Psychiatric Specialty Exam: Review of Systems  Musculoskeletal: Positive for back pain and joint pain.  Psychiatric/Behavioral: Positive for depression, suicidal ideas and  substance abuse. The patient is  nervous/anxious and has insomnia (Improving).   All other systems reviewed and are negative.   Blood pressure 103/72, pulse 107, temperature 97.8 F (36.6 C), temperature source Oral, resp. rate 16, height  (1.575 m), weight 89.359 kg (197 lb), last menstrual period 08/05/2015, SpO2 100 %.Body mass index is 36.02 kg/(m^2).  General Appearance: Casual and Fairly Groomed  Patent attorney::  Good  Speech:  Clear and Coherent  Volume:  Normal  Mood:  Anxious and Depressed  Affect:  Anxious  Thought Process:  Coherent and Goal Directed  Orientation:  Full (Time, Place, and Person)  Thought Content:  Rumination  Suicidal Thoughts:  Yes.  with intent/plan  Homicidal Thoughts:  No  Memory:  Immediate;   Good Recent;   Good Remote;   Good  Judgement:  Poor  Insight:  Lacking  Psychomotor Activity:  Normal  Concentration:  Fair  Recall:  Good  Fund of Knowledge:Fair  Language: Fair  Akathisia:  No  Handed:  Right  AIMS (if indicated):     Assets:  Communication Skills Desire for Improvement  ADL's:  Intact  Cognition: WNL  Sleep:  Number of Hours: 6.25   Treatment Plan Summary:Lenisha Joseline Mccampbell is a 40 y.o.AA Female [place] who is single , unemployed , who was admitted to [facility] adult unit from the medical floor of the [facility] with complaints of suicide attempt by overdose on insulin, Metformin & alcohol intoxication.Pt continues to be labile , sad and has SI with plan od on insulin. Daily contact with patient to assess and evaluate symptoms and progress in treatment and Medication management   Continue supportive approach/coping skills Alcohol dependence; continue the Librium detox protocol/work a relapse prevention plan Depression; continue the Remeron 30 mg po qhs , consider increasing to 45.  No increase at this time                       Add Lithium 150 mg po bid for mood sx, SI.Li level in 5 days. Hallucinations; Continue Abilify  20 mg po daily. Consult  Hospitalist for DM.  Recommendations reviewed; Order nutritional consult.  Patient wanted education on how to eat once discharged Neuropathic Pain: Increase Lyric to 150 mg PO Bid; Continue Neurontin 900 mg PO Tid Anxiety:  Continue Vistaril 25 mg PO  Q 6 hr PRN. Work with CBT/mindfulness Seasonal Allergic Rhinitis: Improved; Continue Claritin 10 mg PO  Daily. Reviewed labs- Hba1c- 11.5, lipid panel abnormal- will add Zocor 20 mg po qpm  , tsh wnl,ekg - qtc -wnl. CSW will start working on disposition.  Patient to participate in therapeutic milieu .       Decorian Schuenemann, MD 08/10/2015, 12:49 PM

## 2015-08-10 NOTE — Clinical Social Work Note (Signed)
Patient referred to [facility] program, case manager will assess tomorrow for possible assistance, also referred to [facility] worker to determine possible eligibility.  Santa Genera, LCSW Lead Clinical Social Worker Phone:  [phone]

## 2015-08-10 NOTE — Progress Notes (Signed)
Adult Psychoeducational Group Note  Date:  08/10/2015 Time:  9:14 PM  Group Topic/Focus:  Wrap-Up Group:   The focus of this group is to help patients review their daily goal of treatment and discuss progress on daily workbooks.  Participation Level:  Active  Participation Quality:  Appropriate  Affect:  Appropriate  Cognitive:  Appropriate  Insight: Appropriate  Engagement in Group:  Engaged  Modes of Intervention:  Discussion  Additional Comments: The patient expressed that she attended group.  Octavio Manns 08/10/2015, 9:14 PM

## 2015-08-10 NOTE — Progress Notes (Signed)
Spoke with Dr Ferne Reus Hospitalist.  Dicussed patients blood sugars not well controlled and fluctuate from 160's to over 400's.  He will see the patient to consult.

## 2015-08-10 NOTE — Progress Notes (Signed)
DAR Note: Gabrianna has been in the day room much of the day.  She was noted putting pictures up on the wall with tooth paste.  Informed her that she cannot put pictures up on the wall.  Environmental completed and she was noted to have multiple paper bags and a pitcher full of equal/splenda packets and a Nutragrain bar.  Talked with her about her blood sugars that she needs follow her diabetic diet.  Her blood sugar was 416 at 1030 am.  Staff with Dr. Elna Breslow and her 18 units of lunch coverage was given early.  At 1145 am her blood sugar was 321 but she refused to take her sliding scale insulin because it would drop her blood sugar down too low.  She agreed to eat lunch and check after.  Blood sugar at 115 pm was 271.  Staff with Dr. Elna Breslow and sliding scale coverage given for lunch.  Talked with her about choosing better options for meals and snacks.  Glucerna given and reassured her that we will be monitoring her blood sugar.  Informed her that she will need to eat her meals on the unit so staff can monitor her carb intake.  After talking about all the diabetes issues, she stated that it would be easier to just "jump off a cliff."  She denies any plan or intent and has agreed to contract for safety.  She denies any A/V hallucinations.  She completed her self inventory and reports that her depression is 9/10 and her hopelessness/anxiety are 10/10.  PRN's given for anxiety with minimal relief.  She states that her goal for today is "getting better" and her goal for today is "go to group and clear my mind."  Encouraged continued participation in group and unit activities.  Q 15 minute checks maintained for safety.  We will continue to monitor the progress towards her goals.

## 2015-08-10 NOTE — Consult Note (Signed)
[facility] Medical Consultation  Barbara Ahart ZOX:[id number] DOB: 11-24-1975 DOA: 08/05/2015   PCP: No primary care provider on file.    Requesting physician: Dr. Elna Breslow Date of consultation: 08/10/15  Reason for consultation: Diabetes management  Chief Complaint: Low blood sugars  HPI: Carolyn Ayala is a 40 y.o. female  with past medical history of diabetes, which was recently diagnosed in October of last year, history of hypertension who is admitted to behavioral health for severe depression. Patient's mother passed away recently. This is the precipitant reason for the patient's depression. Patient has been on insulin for management of diabetes since October of last year. Blood sugars have been noted to be quite labile. Hospitalist was consulted for further management. Patient is mainly concerned about low blood sugars. She had an episode of hypoglycemia yesterday evening. She mentions that she was taking her medications as prescribed even when she was not at behavioral health. However, she would like to be off of insulin if possible. She currently denies any nausea, vomiting.   Home Medications: Prior to Admission medications   Medication Sig Start Date End Date Taking? Authorizing Provider  ARIPiprazole (ABILIFY) 15 MG tablet Take 1 tablet (15 mg total) by mouth daily. 07/09/15   Adonis Brook, NP  capsaicin (ZOSTRIX) 0.025 % cream Apply 1 application topically 2 (two) times daily as needed (burning).    Historical Provider, MD  diclofenac sodium (VOLTAREN) 1 % GEL Apply 2 g topically 2 (two) times daily as needed (pain).    Historical Provider, MD  FLUoxetine (PROZAC) 40 MG capsule Take 40 mg by mouth daily.    Historical Provider, MD  fluticasone (FLONASE) 50 MCG/ACT nasal spray Place 1 spray into both nostrils daily as needed for allergies or rhinitis.    Historical Provider, MD  gabapentin (NEURONTIN) 300 MG capsule Take 3 capsules (900 mg total) by  mouth 4 (four) times daily - after meals and at bedtime. 07/09/15   Adonis Brook, NP  insulin aspart (NOVOLOG) 100 UNIT/ML injection Inject 15 Units into the skin 3 (three) times daily with meals. 08/04/15   Catarina Hartshorn, MD  insulin glargine (LANTUS) 100 UNIT/ML injection Inject 0.55 mLs (55 Units total) into the skin 2 (two) times daily. 08/04/15   Catarina Hartshorn, MD  LORazepam (ATIVAN) 1 MG tablet Take 1 tablet (1 mg total) by mouth 3 (three) times daily. 08/04/15   Catarina Hartshorn, MD  Melatonin 5 MG TABS Take 5 mg by mouth at bedtime as needed (sleep).    Historical Provider, MD  metFORMIN (GLUCOPHAGE) 1000 MG tablet Take 1,000 mg by mouth 2 (two) times daily with a meal.    Historical Provider, MD  mirtazapine (REMERON) 15 MG tablet Take 1 tablet (15 mg total) by mouth at bedtime. 07/09/15   Adonis Brook, NP  Multiple Vitamins-Minerals (MULTIVITAMIN WITH MINERALS) tablet Take 1 tablet by mouth daily.    Historical Provider, MD  Skin Protectants, Misc. (EUCERIN) cream Apply 1 application topically 2 (two) times daily as needed for dry skin.    Historical Provider, MD  Thiamine HCl (VITAMIN B-1 PO) Take 1 tablet by mouth 2 (two) times daily.    Historical Provider, MD    Current Inpatient Medications:  Scheduled: . ARIPiprazole  20 mg Oral Daily  . feeding supplement (GLUCERNA SHAKE)  237 mL Oral TID BM  . gabapentin  900 mg Oral TID PC & HS  . insulin aspart  0-15 Units Subcutaneous TID WC  . insulin aspart  0-5  Units Subcutaneous QHS  . [START ON 08/11/2015] insulin aspart  12 Units Subcutaneous BID WC  . insulin glargine  60 Units Subcutaneous BID  . lithium carbonate  150 mg Oral BID WC  . loratadine  10 mg Oral Daily  . metFORMIN  1,000 mg Oral BID WC  . mirtazapine  30 mg Oral QHS  . multivitamin with minerals  1 tablet Oral Daily  . pregabalin  150 mg Oral BID  . simvastatin  20 mg Oral q1800  . thiamine  100 mg Oral Daily   Continuous:  ZOX:[id number], alum & mag hydroxide-simeth,  cyclobenzaprine, fluticasone, hydrOXYzine, ibuprofen, magnesium hydroxide  Allergies: No Known Allergies  Past Medical History: Past Medical History  Diagnosis Date  . Mental disorder   . Depression   . Diabetes mellitus without complication (HCC)   . Hypertension     Past Surgical History  Procedure Laterality Date  . Cesarean section      Social History:  Social History   Social History  . Marital Status: Single    Spouse Name: N/A  . Number of Children: N/A  . Years of Education: N/A   Occupational History  . Not on file.   Social History Main Topics  . Smoking status: Never Smoker   . Smokeless tobacco: Current User  . Alcohol Use: Yes     Comment: daily  . Drug Use: No  . Sexual Activity: No   Other Topics Concern  . Not on file   Social History Narrative   ** Merged History Encounter **         Family History:  Family History  Problem Relation Age of Onset  . Depression Mother       Physical Examination: Filed Vitals:   08/09/15 0700 08/09/15 0701 08/10/15 0607 08/10/15 0608  BP: 133/95 112/81 122/80 103/72  Pulse: 109 109 115 107  Temp:   97.8 F (36.6 C)   TempSrc:   Oral   Resp: 16  16   Height:      Weight:      SpO2:        General appearance: alert, cooperative, appears stated age, no distress and moderately obese Head: Normocephalic, without obvious abnormality, atraumatic Throat: lips, mucosa, and tongue normal; teeth and gums normal Neck: no adenopathy, no carotid bruit, no JVD, supple, symmetrical, trachea midline and thyroid not enlarged, symmetric, no tenderness/mass/nodules Resp: clear to auscultation bilaterally Cardio: regular rate and rhythm, S1, S2 normal, no murmur, click, rub or gallop GI: soft, non-tender; bowel sounds normal; no masses,  no organomegaly Extremities: extremities normal, atraumatic, no cyanosis or edema Neurologic: No focal deficits  Laboratory Data: Results for orders placed or performed during  the hospital encounter of 08/05/15 (from the past 48 hour(s))  Glucose, capillary     Status: Abnormal   Collection Time: 08/08/15  8:28 PM  Result Value Ref Range   Glucose-Capillary 132 (H) 65 - 99 mg/dL  Glucose, capillary     Status: Abnormal   Collection Time: 08/09/15  6:25 AM  Result Value Ref Range   Glucose-Capillary 145 (H) 65 - 99 mg/dL  Glucose, capillary     Status: None   Collection Time: 08/09/15 12:04 PM  Result Value Ref Range   Glucose-Capillary 71 65 - 99 mg/dL  Glucose, capillary     Status: Abnormal   Collection Time: 08/09/15  4:51 PM  Result Value Ref Range   Glucose-Capillary 382 (H) 65 - 99 mg/dL  Glucose, capillary  Status: None   Collection Time: 08/09/15  8:53 PM  Result Value Ref Range   Glucose-Capillary 69 65 - 99 mg/dL  Glucose, capillary     Status: None   Collection Time: 08/09/15  9:18 PM  Result Value Ref Range   Glucose-Capillary 84 65 - 99 mg/dL  Glucose, capillary     Status: Abnormal   Collection Time: 08/09/15 10:26 PM  Result Value Ref Range   Glucose-Capillary 177 (H) 65 - 99 mg/dL  Glucose, capillary     Status: Abnormal   Collection Time: 08/10/15  6:14 AM  Result Value Ref Range   Glucose-Capillary 379 (H) 65 - 99 mg/dL  Glucose, capillary     Status: Abnormal   Collection Time: 08/10/15 10:14 AM  Result Value Ref Range   Glucose-Capillary 416 (H) 65 - 99 mg/dL  Glucose, capillary     Status: Abnormal   Collection Time: 08/10/15 11:43 AM  Result Value Ref Range   Glucose-Capillary 321 (H) 65 - 99 mg/dL   Comment 1 Notify RN    Comment 2 Document in Chart   Glucose, capillary     Status: Abnormal   Collection Time: 08/10/15  1:27 PM  Result Value Ref Range   Glucose-Capillary 274 (H) 65 - 99 mg/dL  Glucose, capillary     Status: Abnormal   Collection Time: 08/10/15  4:37 PM  Result Value Ref Range   Glucose-Capillary 269 (H) 65 - 99 mg/dL   Comment 1 Notify RN    Comment 2 Document in Chart     Imaging Studies: No  results found.   Impression/Recommendations  Principal Problem:   Depression, major, recurrent, severe with psychosis (HCC) Active Problems:   IDDM (insulin dependent diabetes mellitus) (HCC)   Neuropathic pain   Seasonal allergic rhinitis   Anxiety state   Hyperlipidemia   Alcohol use disorder, moderate, dependence (HCC)   Diabetes mellitus type 2 on insulin CBGs reviewed. Patient's current insulin regimen reviewed. She is on Lantus 60 mg twice a day. She is on sliding scale insulin coverage. She also has meal coverage. She is also noted to be on metformin. She did have an episode of hypoglycemia around 8 PM yesterday. Blood glucose level on February 25 at 8 PM was 132. CBG on 2/24 was 105. This suggests that perhaps she is getting too much insulin with her supper. We will discontinue the meal coverage with supper. Decrease meal coverage dose for breakfast and lunch. Leave the dose of Lantus as is. Continue with sliding scale coverage. Her last HbA1c was 11.4, checked on February 20. This implies very poor control of her diabetes.   Patient was extensively educated regarding her disease. She was explained the rationale for continuing to use insulin. Metformin can be continued as well. She will need dietary education. I have told the nurse to provide her with some information regarding modified carbohydrate diet. LDL was 126 on 324. Agree with continuing statin. UA does not show any proteinuria. Patient may still benefit from being on an ACE inhibitor. However, this can be pursued as an outpatient.  [facility] will followup again tomorrow and adjust treatment depending on her CBGs.   Please contact me if I can be of assistance in the meanwhile. Thank you for this consultation.  [facility]  [facility] Pager [phone]  If 7PM-7AM, please contact night-coverage.  [email or web address] Password [facility]  08/10/2015, 6:54 PM

## 2015-08-10 NOTE — Progress Notes (Addendum)
Inpatient Diabetes Program Recommendations  AACE/ADA: New Consensus Statement on Inpatient Glycemic Control (2015)  Target Ranges:  Prepandial:   less than 140 mg/dL      Peak postprandial:   less than 180 mg/dL (1-2 hours)      Critically ill patients:  140 - 180 mg/dL   Review of Glycemic Control  Diabetes history: DM 2 Outpatient Diabetes medications: Lantus 55 units bid, Novolog 15 units tidwc, Metformin 1000 mg bid Current orders for Inpatient glycemic control: Lantus 60 units BID, Novolog Moderate + HS + Novolog 18 units meal coverage  Inpatient Diabetes Program Recommendations: Insulin - Basal: Patient had barely a hypoglycemic episode yesterday and basal insulin was reduced. Please administer the Lantus 60 units BID as prescribed. Patient is hyperglycemic this am well into the 300's. If patient has a slight hypoglycemia again please reduce that evening dose only by 2-4 units.   Patient's hyperglycemia in the evening may be due to extra snack intake.  Recommend no changes at this time.  Thanks,  Christena Deem RN, MSN, [facility] Inpatient Diabetes Coordinator Team Pager [phone] (8a-5p)

## 2015-08-10 NOTE — [facility] Group Notes (Signed)
[facility] LCSW Group Therapy  08/10/2015 4:16 PM  Type of Therapy:  Group Therapy  Participation Level:  Limited  Participation Quality:  Minimal  Affect:  Appropriate  Cognitive:  Alert   Insight:  Developing/Improving  Engagement in Therapy:  Developing/Improving  Modes of Intervention:  Discussion, Exploration, Problem-solving and Support  Summary of Progress/Problems:  Finding Balance in Life. Today's group focused on defining balance in one's own words, identifying things that can knock one off balance, and exploring healthy ways to maintain balance in life. Group members were asked to provide an example of a time when they felt off balance, describe how they handled that situation, and process healthier ways to regain balance in the future. Group members were asked to share the most important tool for maintaining balance that they learned while at [facility] and how they plan to apply this method after discharge.   Patient engaged herself in her word search puzzles and did not appear to be invested in the topic.  When directly questioned, patient discussed the importance of financial management and budgeting as an important key to maintaining balance in life.  Patient left the room midway during group and returned towards the end.    Sallee Lange

## 2015-08-10 NOTE — Progress Notes (Signed)
Patient had hypoglycemic episode last night. Her blood sugar dropped to 69. Patient treated per protocol. Ijeoma NP on call notified.This morning CBG was 379. Patient's sliding scale order was 15 unit and she also has a meal coverage of 18 units . Writer notified Christen Bame, NP again. NP  advised that writer gives only the meal coverage 18 unit and hold the sliding scale order.  Writer to report to the in coming day Nurse to recheck patient's CBG after an hour and also to put in an order for diabetic consult.

## 2015-08-11 LAB — GLUCOSE, CAPILLARY
Glucose-Capillary: 395 mg/dL — ABNORMAL HIGH (ref 65–99)
Glucose-Capillary: 161 mg/dL — ABNORMAL HIGH (ref 65–99)
Glucose-Capillary: 381 mg/dL — ABNORMAL HIGH (ref 65–99)
Glucose-Capillary: 116 mg/dL — ABNORMAL HIGH (ref 65–99)

## 2015-08-11 MED ORDER — INSULIN ASPART 100 UNIT/ML ~~LOC~~ SOLN
12.0000 [IU] | Freq: Two times a day (BID) | SUBCUTANEOUS | Status: DC
  Administered 2015-08-11 – 2015-08-12 (×3): 12 [IU] via SUBCUTANEOUS

## 2015-08-11 MED ORDER — ARIPIPRAZOLE 10 MG PO TABS
25.0000 mg | ORAL_TABLET | Freq: Every day | ORAL | Status: DC
  Administered 2015-08-12 – 2015-08-17 (×6): 25 mg via ORAL
  Filled 2015-08-11 (×8): qty 3

## 2015-08-11 NOTE — Tx Team (Addendum)
Interdisciplinary Treatment Plan Update (Adult) Date: 08/11/2015    Time Reviewed: 9:30 AM  Progress in Treatment: Attending groups: Yes Participating in groups: No Taking medication as prescribed: Yes Tolerating medication: Yes Family/Significant other contact made: No, Pt declines Patient understands diagnosis: Yes Discussing patient identified problems/goals with staff: Yes Medical problems stabilized or resolved: Yes Denies suicidal/homicidal ideation: Yes Issues/concerns per patient self-inventory: Yes Other:  New problem(s) identified: N/A  Discharge Plan or Barriers: CSW continuing to assess, patient new to milieu.   08/11/15: Discharge plan still not established. Pt has no income and has declined/is not eligible for substance abuse facilities. She has no income and her diabetes is unmanaged.   Reason for Continuation of Hospitalization:  Depression Anxiety Medication Stabilization   Comments: N/A  Estimated length of stay: 3-5 days    Patient is a 40 year old female with a history of Major Depressive Disorder and Bipolar Disorder. Pt presented to the hospital with depression, anxiety, alcohol intoxication, and suicidal ideations. Pt reports primary trigger(s) for admission were a recent break up and family death. Patient will benefit from crisis stabilization, medication evaluation, group therapy and psycho education in addition to case management for discharge planning. At discharge, it is recommended that Pt remain compliant with established discharge plan and continued treatment.  Continue the Librium detox protocol/work a relapse prevention plan Continue the Remeron 30 mg po qhs , consider increasing to 45. No increase at this time. Add Lithium 150 mg po bid for mood sx, SI.Li level in 5 days. Continue Abilify 20 mg po daily. Continue Vistaril 25 mg PO Q 6 hr PRN   Review of initial/current patient goals per problem list:  1. Goal(s): Patient will  participate in aftercare plan   Met: No   Target date: 3-5 days post admission date   As evidenced by: Patient will participate within aftercare plan AEB aftercare provider and housing plan at discharge being identified.  2/23: Goal not met: CSW assessing for appropriate referrals for pt and will have follow up secured prior to d/c.  2/28: Discharge plan undetermined. CSW assessing for appropriate referrals.     2. Goal (s): Patient will exhibit decreased depressive symptoms and suicidal ideations.   Met: No   Target date: 3-5 days post admission date   As evidenced by: Patient will utilize self rating of depression at 3 or below and demonstrate decreased signs of depression or be deemed stable for discharge by MD.  2/23: Goal not met: Pt presents with flat affect and depressed mood.  Pt admitted with depression rating of 10.  Pt to show decreased sign of depression and a rating of 3 or less before d/c.    2/28: Pt rates depression at 9/10; passive SI   3. Goal(s): Patient will demonstrate decreased signs and symptoms of anxiety.   Met: No   Target date: 3-5 days post admission date   As evidenced by: Patient will utilize self rating of anxiety at 3 or below and demonstrated decreased signs of anxiety, or be deemed stable for discharge by MD  2/23: Goal not met: Pt presents with anxious mood and affect.  Pt admitted with anxiety rating of 10.  Pt to show decreased sign of anxiety and a rating of 3 or less before d/c  2/28: Pt rates anxiety at 10/10   4. Goal(s): Patient will demonstrate decreased signs of withdrawal due to substance abuse   Met: Yes   Target date: 3-5 days post admission date  As evidenced by: Patient will produce a CIWA/COWS score of 0, have stable vitals signs, and no symptoms of withdrawal   2/23: Goal met. No withdrawal symptoms reported at this time per medical chart.     Attendees: Patient:    Family:    Physician: Dr. Shea Evans  08/06/2015 9:30 AM  Nursing:Heather Reddick, RN; Larrie Kass., RN 08/06/2015 9:30 AM  Clinical Social Worker: Peri Maris, [place] 08/06/2015 9:30 AM  Other:  08/06/2015 9:30 AM  Other:  08/06/2015 9:30 AM  Other:  08/06/2015 9:30 AM  Other:  08/06/2015 9:30 AM  Other:           Scribe for Treatment Team:  Peri Maris, [place] [phone]

## 2015-08-11 NOTE — [facility] Group Notes (Signed)
[facility] Group Notes:  (Nursing/MHT/Case Management/Adjunct)  Date:  08/11/2015  Time:  9:30am  Type of Therapy:  Nurse Education  Participation Level:  Active  Participation Quality:  Attentive and Sharing  Affect:  Appropriate  Cognitive:  Alert  Insight:  Limited  Engagement in Group:  Improving and Supportive  Modes of Intervention:  Discussion and Education  Summary of Progress/Problems:  Group topic today was recovery.  Discussed what recovery means to the group.  Discussed long term and short term goals.  Reviewed sleep hygiene.  She was able to discuss setting goals so she will be able to follow up when she is discharged.

## 2015-08-11 NOTE — [facility] Group Notes (Signed)
[facility] LCSW Group Therapy 08/11/2015 1:15pm  Type of Therapy: Group Therapy- Feelings Around Discharge & Establishing a Supportive Framework  Participation Level: Active   Modes of Intervention: Clarification, Confrontation, Discussion, Education, Exploration, Limit-setting, Orientation, Problem-solving, Rapport Building, Dance movement psychotherapist, Socialization and Support   Description of Group:   What is a supportive framework? What does it look like feel like and how do I discern it from and unhealthy non-supportive network? Learn how to cope when supports are not helpful and don't support you. Discuss what to do when your family/friends are not supportive.  Summary of Patient Progress Pt was more active in group discussion and identified the importance of community. Pt expressed that she seeks to find understanding and support in the community, especially related to her mental illness. Pt interacted well with peers and was receptive to feedback.   Therapeutic Modalities:   Cognitive Behavioral Therapy Person-Centered Therapy Motivational Interviewing   Chad Cordial, LCSWA 08/11/2015 2:00 PM

## 2015-08-11 NOTE — Progress Notes (Signed)
[facility] MD Progress Note  08/11/2015 12:30 PM Carolyn Ayala  MRN:  [id number]   Subjective: Pt states " I am still depressed, but I really want to go out to the cafeteria and eat. I do not want to eat cinnamon buns anymore , I promise.'   Objective:Javeah Eric Morganti is a 40 y.o.AA Female [place] who is single , unemployed , who was admitted to [facility] adult unit from the medical floor of the [facility] with complaints of suicide attempt by overdose on insulin, Metformin & alcohol intoxication. She was recently discharged from this hospital on 07-09-15 after receiving alcohol detoxification & mood stabilization treatments. She was discharged to follow-up care at the [facility] clinic both in [place], [place].   Patient seen and chart reviewed.Discussed patient with treatment team.  Pt today seen as depressed, but her affect is more reactive,  Pt reports that she continues to have AH , but her AH is mostly voices telling her negative things like " there is no hope '. Pt denies any SI today . Pt according to staff - continues to need a lot of redirection on the unit , needs constant education about watching her diet since she tends to hoard any food that can bring her CBGs up and has poor control over her diet. Will continue to monitor.     Principal Problem: Depression, major, recurrent, severe with psychosis (HCC) Diagnosis:   Patient Active Problem List   Diagnosis Date Noted  . Hyperlipidemia [E78.5] 08/10/2015  . Alcohol use disorder, moderate, dependence (HCC) [F10.20] 08/10/2015  . Neuropathic pain [M79.2] 08/08/2015  . Seasonal allergic rhinitis [J30.2] 08/08/2015  . Anxiety state [F41.1] 08/08/2015  . 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 Uncontrolled diabetes mellitus with neurologic complication, with long-term current use of insulin (HCC) [E11.49, Z79.4, E11.65]   . Uncontrolled diabetes mellitus with diabetic  neuropathy, with long-term current use of insulin (HCC) [E11.40, Z79.4, E11.65] 04/21/2015  . Generalized anxiety disorder [F41.1] 02/13/2013  . Chronic posttraumatic stress disorder [F43.12] 02/13/2013  . Substance induced mood disorder (HCC) [F19.94] 02/08/2013  . IDDM (insulin dependent diabetes mellitus) (HCC) [E11.9, Z79.4] 02/08/2013   Total Time spent with patient: 30 minutes  Past Psychiatric History: see admission H and P  Past Medical History:  Past Medical History  Diagnosis Date  . Mental disorder   . Depression   . Diabetes mellitus without complication (HCC)   . Hypertension     Past Surgical History  Procedure Laterality Date  . Cesarean section     Family History:  Family History  Problem Relation Age of Onset  . Depression Mother    Family Psychiatric  History: see admission H and P Social History:  History  Alcohol Use  . Yes    Comment: daily     History  Drug Use No    Social History   Social History  . Marital Status: Single    Spouse Name: N/A  . Number of Children: N/A  . Years of Education: N/A   Social History Main Topics  . Smoking status: Never Smoker   . Smokeless tobacco: Current User  . Alcohol Use: Yes     Comment: daily  . Drug Use: No  . Sexual Activity: No   Other Topics Concern  . None   Social History Narrative   ** Merged History Encounter **       Additional Social History:    Pain Medications:  pt denies abuse Prescriptions: pt denies abuse Over the Counter: pt denies abuse History of alcohol / drug use?: Yes Longest period of sobriety (when/how long): unknown Negative Consequences of Use: Personal relationships Withdrawal Symptoms: Tremors  Sleep: Good  Appetite:  Good  Current Medications: Current Facility-Administered Medications  Medication Dose Route Frequency Provider Last Rate Last Dose  . acetaminophen (TYLENOL) tablet 650 mg  650 mg Oral Q6H PRN Adonis Brook, NP   650 mg at 08/11/15 0604  .  alum & mag hydroxide-simeth (MAALOX/MYLANTA) 200-200-20 MG/5ML suspension 30 mL  30 mL Oral Q4H PRN Adonis Brook, NP      . ARIPiprazole (ABILIFY) tablet 20 mg  20 mg Oral Daily Rachael Fee, MD   20 mg at 08/11/15 0801  . cyclobenzaprine (FLEXERIL) tablet 10 mg  10 mg Oral TID PRN Rachael Fee, MD   10 mg at 08/11/15 0604  . feeding supplement (GLUCERNA SHAKE) (GLUCERNA SHAKE) liquid 237 mL  237 mL Oral TID BM Cha Gomillion, MD   237 mL at 08/11/15 1000  . fluticasone (FLONASE) 50 MCG/ACT nasal spray 1 spray  1 spray Each Nare Daily PRN Adonis Brook, NP      . hydrOXYzine (ATARAX/VISTARIL) tablet 25 mg  25 mg Oral Q6H PRN Kerry Hough, PA-C   25 mg at 08/11/15 0604  . ibuprofen (ADVIL,MOTRIN) tablet 800 mg  800 mg Oral Q6H PRN Rachael Fee, MD   800 mg at 08/10/15 1708  . insulin aspart (novoLOG) injection 0-15 Units  0-15 Units Subcutaneous TID WC Sanjuana Kava, NP   3 Units at 08/11/15 1221  . insulin aspart (novoLOG) injection 0-5 Units  0-5 Units Subcutaneous QHS Sanjuana Kava, NP   Stopped at 08/10/15 2118  . insulin aspart (novoLOG) injection 12 Units  12 Units Subcutaneous BID WC Jomarie Longs, MD   12 Units at 08/11/15 1222  . insulin glargine (LANTUS) injection 60 Units  60 Units Subcutaneous BID Sanjuana Kava, NP   60 Units at 08/11/15 0802  . lithium carbonate capsule 150 mg  150 mg Oral BID WC Lazarus Sudbury, MD   150 mg at 08/11/15 0800  . loratadine (CLARITIN) tablet 10 mg  10 mg Oral Daily Shuvon B Rankin, NP   10 mg at 08/11/15 0800  . magnesium hydroxide (MILK OF MAGNESIA) suspension 30 mL  30 mL Oral Daily PRN Adonis Brook, NP      . metFORMIN (GLUCOPHAGE) tablet 1,000 mg  1,000 mg Oral BID WC Adonis Brook, NP   1,000 mg at 08/11/15 0800  . mirtazapine (REMERON) tablet 30 mg  30 mg Oral QHS Rachael Fee, MD   30 mg at 08/10/15 2307  . multivitamin with minerals tablet 1 tablet  1 tablet Oral Daily Rachael Fee, MD   1 tablet at 08/11/15 0800  . pregabalin  (LYRICA) capsule 150 mg  150 mg Oral BID Jomarie Longs, MD   150 mg at 08/11/15 0800  . simvastatin (ZOCOR) tablet 20 mg  20 mg Oral q1800 Jomarie Longs, MD   20 mg at 08/10/15 1704  . thiamine (VITAMIN B-1) tablet 100 mg  100 mg Oral Daily Rachael Fee, MD   100 mg at 08/11/15 0800    Lab Results:  Results for orders placed or performed during the hospital encounter of 08/05/15 (from the past 48 hour(s))  Glucose, capillary     Status: Abnormal   Collection Time: 08/09/15  4:51 PM  Result  Value Ref Range   Glucose-Capillary 382 (H) 65 - 99 mg/dL  Glucose, capillary     Status: None   Collection Time: 08/09/15  8:53 PM  Result Value Ref Range   Glucose-Capillary 69 65 - 99 mg/dL  Glucose, capillary     Status: None   Collection Time: 08/09/15  9:18 PM  Result Value Ref Range   Glucose-Capillary 84 65 - 99 mg/dL  Glucose, capillary     Status: Abnormal   Collection Time: 08/09/15 10:26 PM  Result Value Ref Range   Glucose-Capillary 177 (H) 65 - 99 mg/dL  Glucose, capillary     Status: Abnormal   Collection Time: 08/10/15  6:14 AM  Result Value Ref Range   Glucose-Capillary 379 (H) 65 - 99 mg/dL  Glucose, capillary     Status: Abnormal   Collection Time: 08/10/15 10:14 AM  Result Value Ref Range   Glucose-Capillary 416 (H) 65 - 99 mg/dL  Glucose, capillary     Status: Abnormal   Collection Time: 08/10/15 11:43 AM  Result Value Ref Range   Glucose-Capillary 321 (H) 65 - 99 mg/dL   Comment 1 Notify RN    Comment 2 Document in Chart   Glucose, capillary     Status: Abnormal   Collection Time: 08/10/15  1:27 PM  Result Value Ref Range   Glucose-Capillary 274 (H) 65 - 99 mg/dL  Glucose, capillary     Status: Abnormal   Collection Time: 08/10/15  4:37 PM  Result Value Ref Range   Glucose-Capillary 269 (H) 65 - 99 mg/dL   Comment 1 Notify RN    Comment 2 Document in Chart   Glucose, capillary     Status: Abnormal   Collection Time: 08/10/15  8:20 PM  Result Value Ref Range    Glucose-Capillary 133 (H) 65 - 99 mg/dL  Glucose, capillary     Status: Abnormal   Collection Time: 08/11/15  6:39 AM  Result Value Ref Range   Glucose-Capillary 395 (H) 65 - 99 mg/dL  Glucose, capillary     Status: Abnormal   Collection Time: 08/11/15 11:32 AM  Result Value Ref Range   Glucose-Capillary 161 (H) 65 - 99 mg/dL   Comment 1 Notify RN    Comment 2 Document in Chart     Blood Alcohol level:  Lab Results  Component Value Date   ETH 108* 08/01/2015   ETH 103* 04/17/2015    Physical Findings: AIMS: Facial and Oral Movements Muscles of Facial Expression: None, normal Lips and Perioral Area: None, normal Jaw: None, normal Tongue: None, normal,Extremity Movements Upper (arms, wrists, hands, fingers): None, normal Lower (legs, knees, ankles, toes): None, normal, Trunk Movements Neck, shoulders, hips: None, normal, Overall Severity Severity of abnormal movements (highest score from questions above): None, normal Incapacitation due to abnormal movements: None, normal Patient's awareness of abnormal movements (rate only patient's report): No Awareness, Dental Status Current problems with teeth and/or dentures?: No Does patient usually wear dentures?: No  CIWA:  CIWA-Ar Total: 0 COWS:  COWS Total Score: 5  Musculoskeletal: Strength & Muscle Tone: within normal limits Gait & Station: normal Patient leans: normal  Psychiatric Specialty Exam: Review of Systems  Psychiatric/Behavioral: Positive for depression and substance abuse. The patient is nervous/anxious and has insomnia (Improving).   All other systems reviewed and are negative.   Blood pressure 124/88, pulse 109, temperature 97.6 F (36.4 C), temperature source Oral, resp. rate 18, height  (1.575 m), weight 89.359 kg (197 lb),  last menstrual period 08/05/2015, SpO2 100 %.Body mass index is 36.02 kg/(m^2).  General Appearance: Casual and Fairly Groomed  Patent attorney::  Good  Speech:  Clear and Coherent   Volume:  Normal  Mood:  Anxious and Depressed  Affect:  Anxious  Thought Process:  Coherent and Goal Directed  Orientation:  Full (Time, Place, and Person)  Thought Content:  Hallucinations: Auditory and Rumination negative voices   Suicidal Thoughts:  No  Homicidal Thoughts:  No  Memory:  Immediate;   Good Recent;   Good Remote;   Good  Judgement:  Poor  Insight:  Lacking  Psychomotor Activity:  Normal  Concentration:  Fair  Recall:  Good  Fund of Knowledge:Fair  Language: Fair  Akathisia:  No  Handed:  Right  AIMS (if indicated):     Assets:  Communication Skills Desire for Improvement  ADL's:  Intact  Cognition: WNL  Sleep:  Number of Hours: 6   Treatment Plan Summary:Bernardina Mckinsey Keagle is a 40 y.o.AA Female [place] who is single , unemployed , who was admitted to [facility] adult unit from the medical floor of the [facility] with complaints of suicide attempt by overdose on insulin, Metformin & alcohol intoxication.Pt continues to be depressed. Will continue treatment. Daily contact with patient to assess and evaluate symptoms and progress in treatment and Medication management   Continue supportive approach/coping skills Alcohol dependence; continue the Librium detox protocol/work a relapse prevention plan Depression; continue the Remeron 30 mg po qhs , consider increasing to 45.  No increase at this time                       Added Lithium 150 mg po bid for mood sx, SI.Li level in 5 days. Hallucinations; Will increase Abilify to 25 mg po daily. Consulted Hospitalist for DM.  Recommendations reviewed. Neuropathic Pain: Increase Lyric to 150 mg PO Bid; Will DC Neurontin. Anxiety:  Continue Vistaril 25 mg PO  Q 6 hr PRN. Work with CBT/mindfulness Seasonal Allergic Rhinitis: Improved; Continue Claritin 10 mg PO  Daily. Reviewed labs- Hba1c- 11.5, lipid panel abnormal- will continue Zocor 20 mg po qpm  , tsh wnl,ekg - qtc -wnl. CSW will start working on  disposition. Patient to be seen by [facility] program today. Patient to participate in therapeutic milieu .       Javarion Douty, MD 08/11/2015, 12:30 PM

## 2015-08-11 NOTE — Progress Notes (Signed)
DAR Note: Carolyn Ayala has been visible on the unit.  She denies any SI/HI or A/V hallucinations.  She has been asking about having a no roommate order and urged her to talk with Dr. Elna Breslow.  Her reasoning is that her roommate isolates and that bothers her.  She attends groups.   Minimal interaction with peers.  Blood sugar at lunch was 167.  She states that she is going to eat a dessert at lunch and Clinical research associate agreed so she can make some food choices but she agreed to no dessert if her blood sugar is too high at supper.  PM blood sugar was 381. She continues to eat on the unit so staff can monitor her food intake.  She completed her self inventory and reports that her depression and hopelessness are 9/10 and anxiety is 10/10.  Her goal for today is "stability" and she will accomplish her goal by "listening."  Encouraged continued participation in group and unit activities.  Q 15 minute checks maintained for safety.  We will continue to monitor the progress towards her goals.

## 2015-08-11 NOTE — Progress Notes (Signed)
Adult Psychoeducational Group Note  Date:  08/11/2015 Time:  9:16 PM  Group Topic/Focus:  Wrap-Up Group:   The focus of this group is to help patients review their daily goal of treatment and discuss progress on daily workbooks.  Participation Level:  Active  Participation Quality:  Appropriate  Affect:  Appropriate  Cognitive:  Appropriate  Insight: Appropriate  Engagement in Group:  Engaged  Modes of Intervention:  Discussion  Additional Comments:The patient expressed that she attend groups today.  Octavio Manns 08/11/2015, 9:16 PM

## 2015-08-12 DIAGNOSIS — E119 Type 2 diabetes mellitus without complications: Secondary | ICD-10-CM | POA: Insufficient documentation

## 2015-08-12 LAB — GLUCOSE, CAPILLARY
Glucose-Capillary: 241 mg/dL — ABNORMAL HIGH (ref 65–99)
Glucose-Capillary: 254 mg/dL — ABNORMAL HIGH (ref 65–99)
Glucose-Capillary: 307 mg/dL — ABNORMAL HIGH (ref 65–99)
Glucose-Capillary: 241 mg/dL — ABNORMAL HIGH (ref 65–99)

## 2015-08-12 MED ORDER — MIRTAZAPINE 15 MG PO TABS
45.0000 mg | ORAL_TABLET | Freq: Every day | ORAL | Status: DC
  Administered 2015-08-12 – 2015-08-16 (×4): 45 mg via ORAL
  Filled 2015-08-12 (×7): qty 3

## 2015-08-12 MED ORDER — TUBERCULIN PPD 5 UNIT/0.1ML ID SOLN
5.0000 [IU] | Freq: Once | INTRADERMAL | Status: AC
  Administered 2015-08-12: 5 [IU] via INTRADERMAL

## 2015-08-12 MED ORDER — GABAPENTIN 300 MG PO CAPS
900.0000 mg | ORAL_CAPSULE | Freq: Three times a day (TID) | ORAL | Status: DC
  Administered 2015-08-12 – 2015-08-17 (×20): 900 mg via ORAL
  Filled 2015-08-12 (×25): qty 3

## 2015-08-12 MED ORDER — INSULIN ASPART 100 UNIT/ML ~~LOC~~ SOLN
12.0000 [IU] | Freq: Three times a day (TID) | SUBCUTANEOUS | Status: DC
  Administered 2015-08-13 (×2): 12 [IU] via SUBCUTANEOUS

## 2015-08-12 NOTE — Progress Notes (Signed)
D: Pt presents with flat affect and depressed mood. Pt rates depression 10/10. Anxiety 10/10. Hopeless 10/10. Pt denies suicidal thoughts this morning and verbally contracts for safety. Pt reports fair sleep and appetite. Pt reported that she's excited this morning because she had a good visit from a social worker yesterday from [place]. Pt reports having alcohol cravings today. Pt requested to attend meals in the cafeteria today. Pt agreed to make healthy selections and low carb choices. Writer informed Dr. Elna Breslow and she agreed to allow pt to attend lunch in the cafeteria and supper is contingent on how wells she do at lunch time.  A: Medications reviewed with pt. Medications administered as ordered per MD. Verbal support provided. Pt encouraged to attend groups. 15 minute checks performed for safety. R: Pt receptive to tx. Pt stated goal "work with my team to go to [place]". Pt verbalized understanding of med regimen.

## 2015-08-12 NOTE — Progress Notes (Signed)
Reviewed CBG:  CBG (last 3)   Recent Labs  08/12/15 0615 08/12/15 1153 08/12/15 1703  GLUCAP 241* 254* 307*   Consulted DM coordinator for input on changes in insulin regimen  Current regimen includes Lantus 60 U BID and novolog 12 U BID along with SSI. I will add novolog to reflect TID dosage.  Manson Passey [facility] [phone]

## 2015-08-12 NOTE — Progress Notes (Signed)
Adult Psychoeducational Group Note  Date:  08/12/2015 Time:  9:34 PM  Group Topic/Focus:  Wrap-Up Group:   The focus of this group is to help patients review their daily goal of treatment and discuss progress on daily workbooks.  Participation Level:  Active  Participation Quality:  Appropriate  Affect:  Appropriate  Cognitive:  Alert  Insight: Appropriate  Engagement in Group:  Engaged  Modes of Intervention:  Discussion  Additional Comments:  Patient stated her day was good, calm and relaxing. Patient goal for today was to stay positive and to learn more from her doctor.  On a scale between 1-10, (1=worse, 10=best) patient rated her day an 8.  Rianne Degraaf L Bradyn Soward 08/12/2015, 9:34 PM

## 2015-08-12 NOTE — Progress Notes (Signed)
D: Patient alert and oriented x 4. Patient denies pain/SI/HI/AVH. Patient was educated on  CBG's and diabetes. Patient states she does not believe that diabetes can make you go blind or have any amputation. This Clinical research associate educated the process of how a person can lose toes and a foot due to diabetes. Patient still states she don't believe. Patient also was educated on proper foods to eat by this Clinical research associate. Patient states, "I am always hungry and want to eat. I don't care if it runs my sugars up." HS CBG was 116.  A: Staff to monitor Q 15 mins for safety. Encouragement and support offered. Scheduled medications administered per orders. R: Patient remains safe on the unit. Patient attended group tonight. Patient visible on the unit and interacting with peers. Patient taking administered medications.

## 2015-08-12 NOTE — Progress Notes (Signed)
TB completed at 1300 to left forearm. Hardcopy placed in pt chart.

## 2015-08-12 NOTE — Progress Notes (Signed)
[facility] MD Progress Note  08/12/2015 2:00 PM Carolyn Ayala  MRN:  [id number]   Subjective: Pt states " I am a bit hopeful today.'    Objective:Carolyn Ayala is a 40 y.o.AA Female Carolyn Ayala who is single , unemployed , who was admitted to [facility] adult unit from the medical floor of the [facility] with complaints of suicide attempt by overdose on insulin, Metformin & alcohol intoxication. She was recently discharged from this hospital on 07-09-15 after receiving alcohol detoxification & mood stabilization treatments. She was discharged to follow-up care at the [facility] clinic both in [place], [place].   Patient seen and chart reviewed.Discussed patient with treatment team.  Pt today seen as less depressed, more reactive,  Pt is more hopeful today since she has started working with CSW regarding her out patient services and community resources. Pt is tolerating her medications well.  Pt continues to need a lot of encouragement when it comes to taking her medications , managing her diet and watching her CBGs. Pt per staff remains labile , anxious , but has been progressing. Will continue to monitor.     Principal Problem: Depression, major, recurrent, severe with psychosis (HCC) Diagnosis:   Patient Active Problem List   Diagnosis Date Noted  . Hyperlipidemia [E78.5] 08/10/2015  . Alcohol use disorder, moderate, dependence (HCC) [F10.20] 08/10/2015  . Neuropathic pain [M79.2] 08/08/2015  . Seasonal allergic rhinitis [J30.2] 08/08/2015  . Anxiety state [F41.1] 08/08/2015  . 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 Uncontrolled diabetes mellitus with neurologic complication, with long-term current use of insulin (HCC) [E11.49, Z79.4, E11.65]   . Uncontrolled diabetes mellitus with diabetic neuropathy, with long-term current use of insulin (HCC) [E11.40, Z79.4, E11.65] 04/21/2015  . Generalized anxiety disorder [F41.1]  02/13/2013  . Chronic posttraumatic stress disorder [F43.12] 02/13/2013  . Substance induced mood disorder (HCC) [F19.94] 02/08/2013  . IDDM (insulin dependent diabetes mellitus) (HCC) [E11.9, Z79.4] 02/08/2013   Total Time spent with patient: 30 minutes  Past Psychiatric History: see admission H and P  Past Medical History:  Past Medical History  Diagnosis Date  . Mental disorder   . Depression   . Diabetes mellitus without complication (HCC)   . Hypertension     Past Surgical History  Procedure Laterality Date  . Cesarean section     Family History:  Family History  Problem Relation Age of Onset  . Depression Mother    Family Psychiatric  History: see admission H and P Social History:  History  Alcohol Use  . Yes    Comment: daily     History  Drug Use No    Social History   Social History  . Marital Status: Single    Spouse Name: N/A  . Number of Children: N/A  . Years of Education: N/A   Social History Main Topics  . Smoking status: Never Smoker   . Smokeless tobacco: Current User  . Alcohol Use: Yes     Comment: daily  . Drug Use: No  . Sexual Activity: No   Other Topics Concern  . None   Social History Narrative   ** Merged History Encounter **       Additional Social History:    Pain Medications: pt denies abuse Prescriptions: pt denies abuse Over the Counter: pt denies abuse History of alcohol / drug use?: Yes Longest period of sobriety (when/how long): unknown Negative Consequences of Use: Personal relationships Withdrawal Symptoms: Tremors  Sleep: Good  Appetite:  Good  Current Medications: Current Facility-Administered Medications  Medication Dose Route Frequency Provider Last Rate Last Dose  . acetaminophen (TYLENOL) tablet 650 mg  650 mg Oral Q6H PRN Adonis Brook, NP   650 mg at 08/11/15 0604  . alum & mag hydroxide-simeth (MAALOX/MYLANTA) 200-200-20 MG/5ML suspension 30 mL  30 mL Oral Q4H PRN Adonis Brook, NP      .  ARIPiprazole (ABILIFY) tablet 25 mg  25 mg Oral Daily Jomarie Longs, MD   25 mg at 08/12/15 0810  . cyclobenzaprine (FLEXERIL) tablet 10 mg  10 mg Oral TID PRN Rachael Fee, MD   10 mg at 08/12/15 0810  . feeding supplement (GLUCERNA SHAKE) (GLUCERNA SHAKE) liquid 237 mL  237 mL Oral TID BM Billey Wojciak, MD   237 mL at 08/12/15 1301  . fluticasone (FLONASE) 50 MCG/ACT nasal spray 1 spray  1 spray Each Nare Daily PRN Adonis Brook, NP      . gabapentin (NEURONTIN) capsule 900 mg  900 mg Oral TID PC & HS Aniayah Alaniz, MD   900 mg at 08/12/15 1256  . hydrOXYzine (ATARAX/VISTARIL) tablet 25 mg  25 mg Oral Q6H PRN Kerry Hough, PA-C   25 mg at 08/12/15 1257  . ibuprofen (ADVIL,MOTRIN) tablet 800 mg  800 mg Oral Q6H PRN Rachael Fee, MD   800 mg at 08/12/15 0521  . insulin aspart (novoLOG) injection 0-15 Units  0-15 Units Subcutaneous TID WC Sanjuana Kava, NP   8 Units at 08/12/15 1214  . insulin aspart (novoLOG) injection 0-5 Units  0-5 Units Subcutaneous QHS Sanjuana Kava, NP   Stopped at 08/10/15 2118  . insulin aspart (novoLOG) injection 12 Units  12 Units Subcutaneous BID WC Jomarie Longs, MD   12 Units at 08/12/15 1213  . insulin glargine (LANTUS) injection 60 Units  60 Units Subcutaneous BID Sanjuana Kava, NP   60 Units at 08/12/15 1610  . lithium carbonate capsule 150 mg  150 mg Oral BID WC Jomarie Longs, MD   150 mg at 08/12/15 0811  . loratadine (CLARITIN) tablet 10 mg  10 mg Oral Daily Shuvon B Rankin, NP   10 mg at 08/12/15 0812  . magnesium hydroxide (MILK OF MAGNESIA) suspension 30 mL  30 mL Oral Daily PRN Adonis Brook, NP      . metFORMIN (GLUCOPHAGE) tablet 1,000 mg  1,000 mg Oral BID WC Adonis Brook, NP   1,000 mg at 08/12/15 0810  . mirtazapine (REMERON) tablet 45 mg  45 mg Oral QHS Nickolas Chalfin, MD      . multivitamin with minerals tablet 1 tablet  1 tablet Oral Daily Rachael Fee, MD   1 tablet at 08/12/15 [phone]  . simvastatin (ZOCOR) tablet 20 mg  20 mg Oral q1800  Jomarie Longs, MD   20 mg at 08/11/15 1724  . thiamine (VITAMIN B-1) tablet 100 mg  100 mg Oral Daily Rachael Fee, MD   100 mg at 08/12/15 5409  . tuberculin injection 5 Units  5 Units Intradermal Once Jomarie Longs, MD   5 Units at 08/12/15 1300    Lab Results:  Results for orders placed or performed during the hospital encounter of 08/05/15 (from the past 48 hour(s))  Glucose, capillary     Status: Abnormal   Collection Time: 08/10/15  4:37 PM  Result Value Ref Range   Glucose-Capillary 269 (H) 65 - 99 mg/dL   Comment 1 Notify RN  Comment 2 Document in Chart   Glucose, capillary     Status: Abnormal   Collection Time: 08/10/15  8:20 PM  Result Value Ref Range   Glucose-Capillary 133 (H) 65 - 99 mg/dL  Glucose, capillary     Status: Abnormal   Collection Time: 08/11/15  6:39 AM  Result Value Ref Range   Glucose-Capillary 395 (H) 65 - 99 mg/dL  Glucose, capillary     Status: Abnormal   Collection Time: 08/11/15 11:32 AM  Result Value Ref Range   Glucose-Capillary 161 (H) 65 - 99 mg/dL   Comment 1 Notify RN    Comment 2 Document in Chart   Glucose, capillary     Status: Abnormal   Collection Time: 08/11/15  4:47 PM  Result Value Ref Range   Glucose-Capillary 381 (H) 65 - 99 mg/dL  Glucose, capillary     Status: Abnormal   Collection Time: 08/11/15  8:28 PM  Result Value Ref Range   Glucose-Capillary 116 (H) 65 - 99 mg/dL  Glucose, capillary     Status: Abnormal   Collection Time: 08/12/15  6:15 AM  Result Value Ref Range   Glucose-Capillary 241 (H) 65 - 99 mg/dL  Glucose, capillary     Status: Abnormal   Collection Time: 08/12/15 11:53 AM  Result Value Ref Range   Glucose-Capillary 254 (H) 65 - 99 mg/dL   Comment 1 Notify RN    Comment 2 Document in Chart     Blood Alcohol level:  Lab Results  Component Value Date   ETH 108* 08/01/2015   ETH 103* 04/17/2015    Physical Findings: AIMS: Facial and Oral Movements Muscles of Facial Expression: None,  normal Lips and Perioral Area: None, normal Jaw: None, normal Tongue: None, normal,Extremity Movements Upper (arms, wrists, hands, fingers): None, normal Lower (legs, knees, ankles, toes): None, normal, Trunk Movements Neck, shoulders, hips: None, normal, Overall Severity Severity of abnormal movements (highest score from questions above): None, normal Incapacitation due to abnormal movements: None, normal Patient's awareness of abnormal movements (rate only patient's report): No Awareness, Dental Status Current problems with teeth and/or dentures?: No Does patient usually wear dentures?: No  CIWA:  CIWA-Ar Total: 0 COWS:  COWS Total Score: 5  Musculoskeletal: Strength & Muscle Tone: within normal limits Gait & Station: normal Patient leans: normal  Psychiatric Specialty Exam: Review of Systems  Psychiatric/Behavioral: Positive for depression and substance abuse. The patient is nervous/anxious and has insomnia (Improving).   All other systems reviewed and are negative.   Blood pressure 121/75, pulse 109, temperature 97.7 F (36.5 C), temperature source Oral, resp. rate 20, height  (1.575 m), weight 89.359 kg (197 lb), last menstrual period 08/05/2015, SpO2 100 %.Body mass index is 36.02 kg/(m^2).  General Appearance: Casual and Fairly Groomed  Patent attorney::  Good  Speech:  Clear and Coherent  Volume:  Normal  Mood:  Anxious and Depressed  Affect:  Anxious  Thought Process:  Coherent and Goal Directed  Orientation:  Full (Time, Place, and Person)  Thought Content:  Hallucinations: Auditory and Rumination negative voices , improving  Suicidal Thoughts:  No  Homicidal Thoughts:  No  Memory:  Immediate;   Good Recent;   Good Remote;   Good  Judgement:  Poor  Insight:  Lacking  Psychomotor Activity:  Normal  Concentration:  Fair  Recall:  Good  Fund of Knowledge:Fair  Language: Fair  Akathisia:  No  Handed:  Right  AIMS (if indicated):  Assets:  Communication  Skills Desire for Improvement  ADL's:  Intact  Cognition: WNL  Sleep:  Number of Hours: 5.5   Treatment Plan Summary:Carolyn Ayala is a 40 y.o.AA Female Carolyn Ayala who is single , unemployed , who was admitted to [facility] adult unit from the medical floor of the [facility] with complaints of suicide attempt by overdose on insulin, Metformin & alcohol intoxication.Pt continues to be depressed and anxious , but with improvement . Will continue treatment. Daily contact with patient to assess and evaluate symptoms and progress in treatment and Medication management   Continue supportive approach/coping skills Alcohol dependence; continue the Librium detox protocol/work a relapse prevention plan Depression; Increase the Remeron to 45 mg po qhs.                       Added Lithium 150 mg po bid for mood sx, SI.Li level on 08/14/15. Hallucinations; Will continue Abilify  25 mg po daily. Consulted Hospitalist for DM.  Recommendations reviewed. Neuropathic Pain:Will DC Lyrica. Restart Neurontin 900 mg po qid for pain sx. Anxiety:  Continue Vistaril 25 mg PO  Q 6 hr PRN. Work with CBT/mindfulness Seasonal Allergic Rhinitis: Improved; Continue Claritin 10 mg PO  Daily. Reviewed labs- Hba1c- 11.5, lipid panel abnormal- will continue Zocor 20 mg po qpm  , tsh wnl,ekg - qtc -wnl. CSW will start working on disposition. PPD provided today - pending likely referral to ALF . Patient to participate in therapeutic milieu .       Admir Candelas, MD 08/12/2015, 2:00 PM

## 2015-08-12 NOTE — B[facility] Group Notes (Signed)
[facility] LCSW Aftercare Discharge Planning Group Note  08/12/2015 8:45 AM  Pt attended but declined to participate.  Chad Cordial, LCSWA 08/12/2015 4:04 PM

## 2015-08-12 NOTE — BHH Group Notes (Signed)
[facility] [employer] Group Therapy 08/12/2015 1:15pm  Type of Therapy: Mental Health Association Presentation  Participation Level: Active  Participation Quality: Attentive  Affect: Appropriate  Cognitive: Oriented  Insight: Developing/Improving  Engagement in Therapy: Engaged  Modes of Intervention: Discussion, Education and Socialization  Summary of Progress/Problems: Mental Health Association (MHA) Speaker came to talk about his personal journey with substance abuse and addiction. The pt processed ways by which to relate to the speaker. MHA speaker provided handouts and educational information pertaining to groups and services offered by the [facility]. Pt was engaged in speaker's presentation and was receptive to resources provided.    Chad Cordial, LCSWA 08/12/2015 4:05 PM

## 2015-08-13 LAB — GLUCOSE, CAPILLARY
Glucose-Capillary: 158 mg/dL — ABNORMAL HIGH (ref 65–99)
Glucose-Capillary: 131 mg/dL — ABNORMAL HIGH (ref 65–99)
Glucose-Capillary: 246 mg/dL — ABNORMAL HIGH (ref 65–99)
Glucose-Capillary: 158 mg/dL — ABNORMAL HIGH (ref 65–99)

## 2015-08-13 MED ORDER — INSULIN ASPART 100 UNIT/ML ~~LOC~~ SOLN
15.0000 [IU] | Freq: Three times a day (TID) | SUBCUTANEOUS | Status: DC
  Administered 2015-08-13 – 2015-08-17 (×11): 15 [IU] via SUBCUTANEOUS

## 2015-08-13 NOTE — Progress Notes (Signed)
Inpatient Diabetes Program Recommendations  AACE/ADA: New Consensus Statement on Inpatient Glycemic Control (2015)  Target Ranges:  Prepandial:   less than 140 mg/dL      Peak postprandial:   less than 180 mg/dL (1-2 hours)      Critically ill patients:  140 - 180 mg/dL   Review of Glycemic Control  Results for KRYSTLE, POLCYN (MRN [id number]) as of 08/13/2015 08:32  Ref. Range 08/12/2015 06:15 08/12/2015 11:53 08/12/2015 17:03 08/12/2015 20:37 08/13/2015 06:15  Glucose-Capillary Latest Ref Range: 65-99 mg/dL 409 (H) 811 (H) 914 (H) 241 (H) 158 (H)   Inpatient Diabetes Program Recommendations:   Insulin - Meal Coverage: Increase meal coverage to home dose Novolog 15 units TID if patient eats at least 50% of meals. Diet: Please re-evaluate the need for Glucerna supplements with the patient being 197 lbs and eating snacks throughout the day.  Thanks,  Christena Deem RN, MSN, [facility] Inpatient Diabetes Coordinator Team Pager [phone] (8a-5p)

## 2015-08-13 NOTE — Progress Notes (Signed)
Donya was visible on the unit.  Attending groups.  Interacting with peers.   Denies SI/HI or A/V hallucinations.  She continues to c/o of pain in left side and ibuprofen given with minimal relief.  Vistaril given x 2 for anxiety with minimal relief however she is sitting quietly in the day room watching TV or talking on the phone.  Self inventory completed and reports depression 10/10, hopelessness 9/10 and anxiety 10/10.  She reports that her goal for today is "pain management" and she will accomplish this goal by "control my hair triggered temper."  Her blood sugar was 131 at lunch requiring 14 units coverage.  Pt requested to take insulin after lunch since her blood sugar was lower than her usual.  Reminded her to watch her carbs and make better choices.  She stated to writer that "I ate one piece of pizza and I did eat a dessert."  Encouraged continued participation in group and unit activities.  Q 15 minute checks maintained for safety.  We will continue to monitor the progress towards her goals.

## 2015-08-13 NOTE — Clinical Social Work Note (Signed)
PASARR submitted, MUST ID [id number]  Santa Genera, LCSW Lead Clinical Social Worker Phone:  [phone]

## 2015-08-13 NOTE — Progress Notes (Signed)
Appreciate DM coordinator input on insulin regimen TRH to sign off  M[facility] [phone]

## 2015-08-13 NOTE — BHH Group Notes (Signed)
B[facility] Group Notes:  (Nursing/MHT/Case Management/Adjunct)  Date:  08/13/2015  Time:  10:15 am  Type of Therapy:  Nurse Education  Participation Level:  Active  Participation Quality:  Appropriate and Attentive  Affect:  Appropriate  Cognitive:  Appropriate  Insight:  Improving  Engagement in Group:  Developing/Improving and Engaged  Modes of Intervention:  Discussion and Education  Summary of Progress/Problems:  Group topic was Leisure and lifestyle changes.  Discussed making positive changes with leisure activities, trying to focus on positives, make changes with our negative feelings/ideas and importance of daily goals.  Carolyn Ayala states that she likes to cook and she feels that a positive attribute is being dedicated.   Carolyn Ayala 08/13/2015, 11:46 AM

## 2015-08-13 NOTE — Progress Notes (Signed)
[facility] MD Progress Note  08/13/2015 2:19 PM Carolyn Ayala  MRN:  [id number]   Subjective: Pt states " I am still same , but I really want to get help from that program.'    Objective:Carolyn Ayala is a 40 y.o.AA Female Carolyn Ayala who is single , unemployed , who was admitted to [facility] adult unit from the medical floor of the [facility] with complaints of suicide attempt by overdose on insulin, Metformin & alcohol intoxication. She was recently discharged from this hospital on 07-09-15 after receiving alcohol detoxification & mood stabilization treatments. She was discharged to follow-up care at the [facility] clinic both in [place], [place].   Patient seen and chart reviewed.Discussed patient with treatment team.  Pt today seen as less depressed, continues to be hopeful about [facility] services . Pt tolerating her medications well.  Pt reports she is more watchful about what she eats and how she manages her CBGs. Pt per staff , pt has been progressing, is less anxious than on admission. Will continue to monitor.     Principal Problem: Depression, major, recurrent, severe with psychosis (HCC) Diagnosis:   Patient Active Problem List   Diagnosis Date Noted  . Diabetes mellitus without complication (HCC) [E11.9]   . Hyperlipidemia [E78.5] 08/10/2015  . Alcohol use disorder, moderate, dependence (HCC) [F10.20] 08/10/2015  . Neuropathic pain [M79.2] 08/08/2015  . Seasonal allergic rhinitis [J30.2] 08/08/2015  . Anxiety state [F41.1] 08/08/2015  . 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 Uncontrolled diabetes mellitus with neurologic complication, with long-term current use of insulin (HCC) [E11.49, Z79.4, E11.65]   . Uncontrolled diabetes mellitus with diabetic neuropathy, with long-term current use of insulin (HCC) [E11.40, Z79.4, E11.65] 04/21/2015  . Generalized anxiety disorder [F41.1] 02/13/2013  . Chronic posttraumatic  stress disorder [F43.12] 02/13/2013  . Substance induced mood disorder (HCC) [F19.94] 02/08/2013  . IDDM (insulin dependent diabetes mellitus) (HCC) [E11.9, Z79.4] 02/08/2013   Total Time spent with patient: 30 minutes  Past Psychiatric History: see admission H and P  Past Medical History:  Past Medical History  Diagnosis Date  . Mental disorder   . Depression   . Diabetes mellitus without complication (HCC)   . Hypertension     Past Surgical History  Procedure Laterality Date  . Cesarean section     Family History:  Family History  Problem Relation Age of Onset  . Depression Mother    Family Psychiatric  History: see admission H and P Social History:  History  Alcohol Use  . Yes    Comment: daily     History  Drug Use No    Social History   Social History  . Marital Status: Single    Spouse Name: N/A  . Number of Children: N/A  . Years of Education: N/A   Social History Main Topics  . Smoking status: Never Smoker   . Smokeless tobacco: Current User  . Alcohol Use: Yes     Comment: daily  . Drug Use: No  . Sexual Activity: No   Other Topics Concern  . None   Social History Narrative   ** Merged History Encounter **       Additional Social History:    Pain Medications: pt denies abuse Prescriptions: pt denies abuse Over the Counter: pt denies abuse History of alcohol / drug use?: Yes Longest period of sobriety (when/how long): unknown Negative Consequences of Use: Personal relationships Withdrawal Symptoms: Tremors  Sleep: Good  Appetite:  Good  Current Medications: Current Facility-Administered Medications  Medication Dose Route Frequency Provider Last Rate Last Dose  . acetaminophen (TYLENOL) tablet 650 mg  650 mg Oral Q6H PRN Adonis Brook, NP   650 mg at 08/11/15 0604  . alum & mag hydroxide-simeth (MAALOX/MYLANTA) 200-200-20 MG/5ML suspension 30 mL  30 mL Oral Q4H PRN Adonis Brook, NP      . ARIPiprazole (ABILIFY) tablet 25 mg  25 mg  Oral Daily Jomarie Longs, MD   25 mg at 08/13/15 0815  . cyclobenzaprine (FLEXERIL) tablet 10 mg  10 mg Oral TID PRN Rachael Fee, MD   10 mg at 08/13/15 0816  . fluticasone (FLONASE) 50 MCG/ACT nasal spray 1 spray  1 spray Each Nare Daily PRN Adonis Brook, NP      . gabapentin (NEURONTIN) capsule 900 mg  900 mg Oral TID PC & HS Tilley Faeth, MD   900 mg at 08/13/15 1251  . hydrOXYzine (ATARAX/VISTARIL) tablet 25 mg  25 mg Oral Q6H PRN Kerry Hough, PA-C   25 mg at 08/13/15 0816  . ibuprofen (ADVIL,MOTRIN) tablet 800 mg  800 mg Oral Q6H PRN Rachael Fee, MD   800 mg at 08/13/15 0816  . insulin aspart (novoLOG) injection 0-15 Units  0-15 Units Subcutaneous TID WC Sanjuana Kava, NP   2 Units at 08/13/15 1252  . insulin aspart (novoLOG) injection 0-5 Units  0-5 Units Subcutaneous QHS Sanjuana Kava, NP   2 Units at 08/12/15 2255  . insulin aspart (novoLOG) injection 15 Units  15 Units Subcutaneous TID WC Mikalyn Hermida, MD      . insulin glargine (LANTUS) injection 60 Units  60 Units Subcutaneous BID Sanjuana Kava, NP   60 Units at 08/13/15 [phone]  . lithium carbonate capsule 150 mg  150 mg Oral BID WC Jomarie Longs, MD   150 mg at 08/13/15 0815  . loratadine (CLARITIN) tablet 10 mg  10 mg Oral Daily Shuvon B Rankin, NP   10 mg at 08/13/15 0815  . magnesium hydroxide (MILK OF MAGNESIA) suspension 30 mL  30 mL Oral Daily PRN Adonis Brook, NP      . metFORMIN (GLUCOPHAGE) tablet 1,000 mg  1,000 mg Oral BID WC Adonis Brook, NP   1,000 mg at 08/13/15 0815  . mirtazapine (REMERON) tablet 45 mg  45 mg Oral QHS Jomarie Longs, MD   45 mg at 08/12/15 2255  . multivitamin with minerals tablet 1 tablet  1 tablet Oral Daily Rachael Fee, MD   1 tablet at 08/13/15 0815  . simvastatin (ZOCOR) tablet 20 mg  20 mg Oral q1800 Jomarie Longs, MD   20 mg at 08/12/15 1709  . thiamine (VITAMIN B-1) tablet 100 mg  100 mg Oral Daily Rachael Fee, MD   100 mg at 08/13/15 0815  . tuberculin injection 5 Units  5  Units Intradermal Once Jomarie Longs, MD   5 Units at 08/12/15 1300    Lab Results:  Results for orders placed or performed during the hospital encounter of 08/05/15 (from the past 48 hour(s))  Glucose, capillary     Status: Abnormal   Collection Time: 08/11/15  4:47 PM  Result Value Ref Range   Glucose-Capillary 381 (H) 65 - 99 mg/dL  Glucose, capillary     Status: Abnormal   Collection Time: 08/11/15  8:28 PM  Result Value Ref Range   Glucose-Capillary 116 (H) 65 - 99 mg/dL  Glucose, capillary  Status: Abnormal   Collection Time: 08/12/15  6:15 AM  Result Value Ref Range   Glucose-Capillary 241 (H) 65 - 99 mg/dL  Glucose, capillary     Status: Abnormal   Collection Time: 08/12/15 11:53 AM  Result Value Ref Range   Glucose-Capillary 254 (H) 65 - 99 mg/dL   Comment 1 Notify RN    Comment 2 Document in Chart   Glucose, capillary     Status: Abnormal   Collection Time: 08/12/15  5:03 PM  Result Value Ref Range   Glucose-Capillary 307 (H) 65 - 99 mg/dL  Glucose, capillary     Status: Abnormal   Collection Time: 08/12/15  8:37 PM  Result Value Ref Range   Glucose-Capillary 241 (H) 65 - 99 mg/dL  Glucose, capillary     Status: Abnormal   Collection Time: 08/13/15  6:15 AM  Result Value Ref Range   Glucose-Capillary 158 (H) 65 - 99 mg/dL  Glucose, capillary     Status: Abnormal   Collection Time: 08/13/15 11:40 AM  Result Value Ref Range   Glucose-Capillary 131 (H) 65 - 99 mg/dL   Comment 1 Notify RN    Comment 2 Document in Chart     Blood Alcohol level:  Lab Results  Component Value Date   ETH 108* 08/01/2015   ETH 103* 04/17/2015    Physical Findings: AIMS: Facial and Oral Movements Muscles of Facial Expression: None, normal Lips and Perioral Area: None, normal Jaw: None, normal Tongue: None, normal,Extremity Movements Upper (arms, wrists, hands, fingers): None, normal Lower (legs, knees, ankles, toes): None, normal, Trunk Movements Neck, shoulders, hips:  None, normal, Overall Severity Severity of abnormal movements (highest score from questions above): None, normal Incapacitation due to abnormal movements: None, normal Patient's awareness of abnormal movements (rate only patient's report): No Awareness, Dental Status Current problems with teeth and/or dentures?: No Does patient usually wear dentures?: No  CIWA:  CIWA-Ar Total: 0 COWS:  COWS Total Score: 5  Musculoskeletal: Strength & Muscle Tone: within normal limits Gait & Station: normal Patient leans: normal  Psychiatric Specialty Exam: Review of Systems  Psychiatric/Behavioral: Positive for depression and substance abuse. The patient is nervous/anxious and has insomnia (Improving).   All other systems reviewed and are negative.   Blood pressure 123/77, pulse 115, temperature 97.4 F (36.3 C), temperature source Oral, resp. rate 18, height 5\' 2"  (1.575 m), weight 89.359 kg (197 lb), last menstrual period 08/05/2015, SpO2 100 %.Body mass index is 36.02 kg/(m^2).  General Appearance: Casual and Fairly Groomed  Patent attorney::  Good  Speech:  Clear and Coherent  Volume:  Normal  Mood:  Anxious and Depressed improving  Affect:  Anxious  Thought Process:  Coherent and Goal Directed  Orientation:  Full (Time, Place, and Person)  Thought Content:  Hallucinations: Auditory and Rumination negative voices , improving  Suicidal Thoughts:  No  Homicidal Thoughts:  No  Memory:  Immediate;   Good Recent;   Good Remote;   Good  Judgement:  Poor  Insight:  Lacking  Psychomotor Activity:  Normal  Concentration:  Fair  Recall:  Good  Fund of Knowledge:Fair  Language: Fair  Akathisia:  No  Handed:  Right  AIMS (if indicated):     Assets:  Communication Skills Desire for Improvement  ADL's:  Intact  Cognition: WNL  Sleep:  Number of Hours: 5.5   Treatment Plan Summary:Carolyn Ayala is a 40 y.o.AA Female Carolyn Ayala who is single , unemployed , who was admitted  to [facility] adult unit  from the medical floor of the [facility] with complaints of suicide attempt by overdose on insulin, Metformin & alcohol intoxication.Pt continues to be depressed and anxious , but with improvement . Will continue treatment. Daily contact with patient to assess and evaluate symptoms and progress in treatment and Medication management   Continue supportive approach/coping skills Alcohol dependence; continue the Librium detox protocol/work a relapse prevention plan Depression; Increase the Remeron to 45 mg po qhs.                       Added Lithium 150 mg po bid for mood sx, SI.Li level on 08/14/15. Hallucinations; Will continue Abilify  25 mg po daily. Consulted Hospitalist for DM.  Recommendations reviewed. Neuropathic Pain:Will DC Lyrica. Restart Neurontin 900 mg po qid for pain sx. Anxiety:  Continue Vistaril 25 mg PO  Q 6 hr PRN. Work with CBT/mindfulness Seasonal Allergic Rhinitis: Improved; Continue Claritin 10 mg PO  Daily. Reviewed labs- Hba1c- 11.5, lipid panel abnormal- will continue Zocor 20 mg po qpm  , tsh wnl,ekg - qtc -wnl. CSW will start working on disposition. PPD provided today - pending likely referral to ALF . Patient to participate in therapeutic milieu .       Kelley Polinsky, MD 08/13/2015, 2:19 PM

## 2015-08-13 NOTE — [place] FL2 (Signed)
[place] MEDICAID FL2 LEVEL OF CARE SCREENING TOOL     IDENTIFICATION  Patient Name: Carolyn Ayala Birthdate: 10-17-1975 Sex: female Admission Date (Current Location): 08/05/2015  [facility] and [place] Number:  Producer, television/film/video and Address:   [facility] - [address], [place] [address])      Provider Number: [id number]  Attending Physician Name and Address:  Rachael Fee, MD  Relative Name and Phone Number:       Current Level of Care: Hospital Recommended Level of Care: Assisted Living Facility, [facility] Prior Approval Number:    Date Approved/Denied:   PASRR Number:    Discharge Plan: Domiciliary (Rest home)    Current Diagnoses: Patient Active Problem List   Diagnosis Date Noted  . Diabetes mellitus without complication (HCC)   . Hyperlipidemia 08/10/2015  . Alcohol use disorder, moderate, dependence (HCC) 08/10/2015  . Neuropathic pain 08/08/2015  . Seasonal allergic rhinitis 08/08/2015  . Anxiety state 08/08/2015  . 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 Uncontrolled diabetes mellitus with neurologic complication, with long-term current use of insulin (HCC)   . Uncontrolled diabetes mellitus with diabetic neuropathy, with long-term current use of insulin (HCC) 04/21/2015  . Generalized anxiety disorder 02/13/2013  . Chronic posttraumatic stress disorder 02/13/2013  . Substance induced mood disorder (HCC) 02/08/2013  . IDDM (insulin dependent diabetes mellitus) (HCC) 02/08/2013    Orientation RESPIRATION BLADDER Height & Weight     Self, Time, Situation, Place  Normal Continent Weight: 197 lb (89.359 kg) Height:   (157.5 cm)  BEHAVIORAL SYMPTOMS/MOOD NEUROLOGICAL BOWEL NUTRITION STATUS      Continent Diet  AMBULATORY STATUS COMMUNICATION OF NEEDS Skin   Independent Verbally Normal                       Personal Care Assistance Level of Assistance  Bathing, Feeding, Dressing  Bathing Assistance: Independent Feeding assistance: Independent Dressing Assistance: Independent     Functional Limitations Info  Sight, Hearing, Speech Sight Info: Adequate Hearing Info: Adequate Speech Info: Adequate    SPECIAL CARE FACTORS FREQUENCY                       Contractures Contractures Info: Not present    Additional Factors Info  Insulin Sliding Scale, Code Status Code Status Info: Full code     Insulin Sliding Scale Info: 4x/day       Current Medications (08/13/2015):  This is the current hospital active medication list Current Facility-Administered Medications  Medication Dose Route Frequency Provider Last Rate Last Dose  . acetaminophen (TYLENOL) tablet 650 mg  650 mg Oral Q6H PRN Adonis Brook, NP   650 mg at 08/11/15 0604  . alum & mag hydroxide-simeth (MAALOX/MYLANTA) 200-200-20 MG/5ML suspension 30 mL  30 mL Oral Q4H PRN Adonis Brook, NP      . ARIPiprazole (ABILIFY) tablet 25 mg  25 mg Oral Daily Jomarie Longs, MD   25 mg at 08/13/15 0815  . cyclobenzaprine (FLEXERIL) tablet 10 mg  10 mg Oral TID PRN Rachael Fee, MD   10 mg at 08/13/15 0816  . fluticasone (FLONASE) 50 MCG/ACT nasal spray 1 spray  1 spray Each Nare Daily PRN Adonis Brook, NP      . gabapentin (NEURONTIN) capsule 900 mg  900 mg Oral TID PC & HS Saramma Eappen, MD   900 mg at 08/13/15 1251  . hydrOXYzine (ATARAX/VISTARIL) tablet 25  mg  25 mg Oral Q6H PRN Kerry Hough, PA-C   25 mg at 08/13/15 1914  . ibuprofen (ADVIL,MOTRIN) tablet 800 mg  800 mg Oral Q6H PRN Rachael Fee, MD   800 mg at 08/13/15 0816  . insulin aspart (novoLOG) injection 0-15 Units  0-15 Units Subcutaneous TID WC Sanjuana Kava, NP   2 Units at 08/13/15 1252  . insulin aspart (novoLOG) injection 0-5 Units  0-5 Units Subcutaneous QHS Sanjuana Kava, NP   2 Units at 08/12/15 2255  . insulin aspart (novoLOG) injection 15 Units  15 Units Subcutaneous TID WC Saramma Eappen, MD      . insulin glargine (LANTUS)  injection 60 Units  60 Units Subcutaneous BID Sanjuana Kava, NP   60 Units at 08/13/15 [phone]  . lithium carbonate capsule 150 mg  150 mg Oral BID WC Jomarie Longs, MD   150 mg at 08/13/15 0815  . loratadine (CLARITIN) tablet 10 mg  10 mg Oral Daily Shuvon B Rankin, NP   10 mg at 08/13/15 0815  . magnesium hydroxide (MILK OF MAGNESIA) suspension 30 mL  30 mL Oral Daily PRN Adonis Brook, NP      . metFORMIN (GLUCOPHAGE) tablet 1,000 mg  1,000 mg Oral BID WC Adonis Brook, NP   1,000 mg at 08/13/15 0815  . mirtazapine (REMERON) tablet 45 mg  45 mg Oral QHS Jomarie Longs, MD   45 mg at 08/12/15 2255  . multivitamin with minerals tablet 1 tablet  1 tablet Oral Daily Rachael Fee, MD   1 tablet at 08/13/15 0815  . simvastatin (ZOCOR) tablet 20 mg  20 mg Oral q1800 Jomarie Longs, MD   20 mg at 08/12/15 1709  . thiamine (VITAMIN B-1) tablet 100 mg  100 mg Oral Daily Rachael Fee, MD   100 mg at 08/13/15 0815  . tuberculin injection 5 Units  5 Units Intradermal Once Jomarie Longs, MD   5 Units at 08/12/15 1300     Discharge Medications: Please see discharge summary for a list of discharge medications.  Relevant Imaging Results:  Relevant Lab Results:   Additional Information No known allergies  Sallee Lange, LCSW

## 2015-08-13 NOTE — [facility] Group Notes (Signed)
[facility] LCSW Group Therapy 08/13/2015 1:15pm  Type of Therapy: Group Therapy- Balance in Life  Participation Level: Minimal  Description of the Group:  The topic for group was balance in life. Today's group focused on defining balance in one's own words, identifying things that can knock one off balance, and exploring healthy ways to maintain balance in life. Group members were asked to provide an example of a time when they felt off balance, describe how they handled that situation,and process healthier ways to regain balance in the future. Group members were asked to share the most important tool for maintaining balance that they learned while at [facility] and how they plan to apply this method after discharge.  Summary of Patient Progress Pt did not participate in group discussion.   Therapeutic Modalities:   Cognitive Behavioral Therapy Solution-Focused Therapy Assertiveness Training   Chad Cordial, LCSWA 08/13/2015 2:06 PM

## 2015-08-13 NOTE — Progress Notes (Signed)
Adult Psychoeducational Group Note  Date:  08/13/2015 Time:  9:18 PM  Group Topic/Focus:  Wrap-Up Group:   The focus of this group is to help patients review their daily goal of treatment and discuss progress on daily workbooks.  Participation Level:  Active  Participation Quality:  Appropriate  Affect:  Appropriate  Cognitive:  Appropriate  Insight: Appropriate  Engagement in Group:  Engaged  Modes of Intervention:  Discussion  Additional Comments:  The patient expressed that he attended group.The patient also said that she  rates his day a 10.    Octavio Manns 08/13/2015, 9:18 PM

## 2015-08-13 NOTE — Progress Notes (Signed)
D: Patient alert and oriented x 4. Patient denies SI/HI/AVH. Patient complained of left side pain. Ibuprofen given at 1938 with effective results. Patient was reeducated before snack on healthier choices.  A: Staff to monitor Q 15 mins for safety. Encouragement and support offered. Scheduled medications administered per orders. R: Patient remains safe on the unit. Patient attended group tonight. Patient visible on the unit and interacting with peers. Patient taking administered medications.

## 2015-08-14 LAB — GLUCOSE, CAPILLARY
Glucose-Capillary: 162 mg/dL — ABNORMAL HIGH (ref 65–99)
Glucose-Capillary: 201 mg/dL — ABNORMAL HIGH (ref 65–99)
Glucose-Capillary: 271 mg/dL — ABNORMAL HIGH (ref 65–99)
Glucose-Capillary: 81 mg/dL (ref 65–99)
Glucose-Capillary: 207 mg/dL — ABNORMAL HIGH (ref 65–99)

## 2015-08-14 LAB — LITHIUM LEVEL: Lithium Lvl: 0.16 mmol/L — ABNORMAL LOW (ref 0.60–1.20)

## 2015-08-14 MED ORDER — IBUPROFEN 800 MG PO TABS
800.0000 mg | ORAL_TABLET | Freq: Three times a day (TID) | ORAL | Status: DC | PRN
  Administered 2015-08-14 – 2015-08-16 (×5): 800 mg via ORAL
  Filled 2015-08-14 (×5): qty 1

## 2015-08-14 MED ORDER — LITHIUM CARBONATE 300 MG PO CAPS
300.0000 mg | ORAL_CAPSULE | Freq: Three times a day (TID) | ORAL | Status: DC
  Administered 2015-08-14 – 2015-08-17 (×8): 300 mg via ORAL
  Filled 2015-08-14 (×13): qty 1

## 2015-08-14 NOTE — Progress Notes (Signed)
[facility] MD Progress Note  08/14/2015 12:53 PM Jakiya Delton PrairieDenise XXXCardenas  MRN:  [id number]   Subjective: Pt states " I am still having a lot of negative voices in my head and I still feel depressed.'     Objective:Luiza Lucy AntiguaDenise Monie is a 40 y.o.AA Female Tomikia who is single , unemployed , who was [facility] from [facility] medical floor of the [facility] with complaints of suicide attempt by overdose on insulin, Metformin & alcohol intoxication. She was recently discharged from this hospital on 07-09-15 after receiving alcohol detoxification & mood stabilization treatments. She was discharged to [facility]-up care [facility] [place] in [place], [place].   Patient seen and chart reviewed.Discussed patient with treatment team.  Pt today seen as anxious , sad, states she continues to have AH telling her negative stuff which she is afraid to talk about. Pt seen as restless and fidgety at times . Continues to need a lot of redirection from staff especially when it comes to taking care of her diet , and manging her CBGs. Pt is only partially compliant on her medications , refused her remeron and insulin last night. Pt provided with medication education. Will continue to encourage and support. Will continue to monitor.     Principal Problem: Depression, major, recurrent, severe with psychosis (HCC) Diagnosis:   Patient Active Problem List   Diagnosis Date Noted  . Diabetes mellitus without complication (HCC) [E11.9]   . Hyperlipidemia [E78.5] 08/10/2015  . Alcohol use disorder, moderate, dependence (HCC) [F10.20] 08/10/2015  . Neuropathic pain [M79.2] 08/08/2015  . Seasonal allergic rhinitis [J30.2] 08/08/2015  . Anxiety state [F41.1] 08/08/2015  . 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 Uncontrolled diabetes mellitus with neurologic complication, with long-term current use of insulin (HCC) [E11.49, Z79.4, E11.65]   .  Uncontrolled diabetes mellitus with diabetic neuropathy, with long-term current use of insulin (HCC) [E11.40, Z79.4, E11.65] 04/21/2015  . Generalized anxiety disorder [F41.1] 02/13/2013  . Chronic posttraumatic stress disorder [F43.12] 02/13/2013  . Substance induced mood disorder (HCC) [F19.94] 02/08/2013  . IDDM (insulin dependent diabetes mellitus) (HCC) [E11.9, Z79.4] 02/08/2013   Total Time spent with patient: 30 minutes  Past Psychiatric History: see admission H and P  Past Medical History:  Past Medical History  Diagnosis Date  . Mental disorder   . Depression   . Diabetes mellitus without complication (HCC)   . Hypertension     Past Surgical History  Procedure Laterality Date  . Cesarean section     Family History:  Family History  Problem Relation Age of Onset  . Depression Mother    Family Psychiatric  History: see admission H and P Social History:  History  Alcohol Use  . Yes    Comment: daily     History  Drug Use No    Social History   Social History  . Marital Status: Single    Spouse Name: N/A  . Number of Children: N/A  . Years of Education: N/A   Social History Main Topics  . Smoking status: Never Smoker   . Smokeless tobacco: Current User  . Alcohol Use: Yes     Comment: daily  . Drug Use: No  . Sexual Activity: No   Other Topics Concern  . None   Social History Narrative   ** Merged History Encounter **       Additional Social History:    Pain Medications: pt denies abuse  Prescriptions: pt denies abuse Over the Counter: pt denies abuse History of alcohol / drug use?: Yes Longest period of sobriety (when/how long): unknown Negative Consequences of Use: Personal relationships Withdrawal Symptoms: Tremors  Sleep: Fair  Appetite:  Good  Current Medications: Current Facility-Administered Medications  Medication Dose Route Frequency Provider Last Rate Last Dose  . acetaminophen (TYLENOL) tablet 650 mg  650 mg Oral Q6H PRN  Adonis Brook, NP   650 mg at 08/11/15 0604  . alum & mag hydroxide-simeth (MAALOX/MYLANTA) 200-200-20 MG/5ML suspension 30 mL  30 mL Oral Q4H PRN Adonis Brook, NP      . ARIPiprazole (ABILIFY) tablet 25 mg  25 mg Oral Daily Jomarie Longs, MD   25 mg at 08/14/15 0807  . cyclobenzaprine (FLEXERIL) tablet 10 mg  10 mg Oral TID PRN Rachael Fee, MD   10 mg at 08/14/15 0810  . fluticasone (FLONASE) 50 MCG/ACT nasal spray 1 spray  1 spray Each Nare Daily PRN Adonis Brook, NP      . gabapentin (NEURONTIN) capsule 900 mg  900 mg Oral TID PC & HS Monroe Qin, MD   900 mg at 08/14/15 1204  . hydrOXYzine (ATARAX/VISTARIL) tablet 25 mg  25 mg Oral Q6H PRN Kerry Hough, PA-C   25 mg at 08/14/15 1204  . ibuprofen (ADVIL,MOTRIN) tablet 800 mg  800 mg Oral Q6H PRN Rachael Fee, MD   800 mg at 08/14/15 1204  . insulin aspart (novoLOG) injection 0-15 Units  0-15 Units Subcutaneous TID WC Sanjuana Kava, NP   5 Units at 08/14/15 1221  . insulin aspart (novoLOG) injection 0-5 Units  0-5 Units Subcutaneous QHS Sanjuana Kava, NP   2 Units at 08/12/15 2255  . insulin aspart (novoLOG) injection 15 Units  15 Units Subcutaneous TID WC Jomarie Longs, MD   15 Units at 08/14/15 1222  . insulin glargine (LANTUS) injection 60 Units  60 Units Subcutaneous BID Sanjuana Kava, NP   60 Units at 08/14/15 1610  . lithium carbonate capsule 300 mg  300 mg Oral TID WC Theresia Pree, MD      . loratadine (CLARITIN) tablet 10 mg  10 mg Oral Daily Shuvon B Rankin, NP   10 mg at 08/14/15 0807  . magnesium hydroxide (MILK OF MAGNESIA) suspension 30 mL  30 mL Oral Daily PRN Adonis Brook, NP      . metFORMIN (GLUCOPHAGE) tablet 1,000 mg  1,000 mg Oral BID WC Adonis Brook, NP   1,000 mg at 08/14/15 0807  . mirtazapine (REMERON) tablet 45 mg  45 mg Oral QHS Jomarie Longs, MD   45 mg at 08/12/15 2255  . multivitamin with minerals tablet 1 tablet  1 tablet Oral Daily Rachael Fee, MD   1 tablet at 08/14/15 [phone]  . simvastatin  (ZOCOR) tablet 20 mg  20 mg Oral q1800 Jomarie Longs, MD   20 mg at 08/13/15 1723  . thiamine (VITAMIN B-1) tablet 100 mg  100 mg Oral Daily Rachael Fee, MD   100 mg at 08/14/15 0807  . tuberculin injection 5 Units  5 Units Intradermal Once Jomarie Longs, MD   5 Units at 08/12/15 1300    Lab Results:  Results for orders placed or performed during the hospital encounter of 08/05/15 (from the past 48 hour(s))  Glucose, capillary     Status: Abnormal   Collection Time: 08/12/15  5:03 PM  Result Value Ref Range   Glucose-Capillary 307 (H) 65 -  99 mg/dL  Glucose, capillary     Status: Abnormal   Collection Time: 08/12/15  8:37 PM  Result Value Ref Range   Glucose-Capillary 241 (H) 65 - 99 mg/dL  Glucose, capillary     Status: Abnormal   Collection Time: 08/13/15  6:15 AM  Result Value Ref Range   Glucose-Capillary 158 (H) 65 - 99 mg/dL  Glucose, capillary     Status: Abnormal   Collection Time: 08/13/15 11:40 AM  Result Value Ref Range   Glucose-Capillary 131 (H) 65 - 99 mg/dL   Comment 1 Notify RN    Comment 2 Document in Chart   Glucose, capillary     Status: Abnormal   Collection Time: 08/13/15  4:55 PM  Result Value Ref Range   Glucose-Capillary 246 (H) 65 - 99 mg/dL   Comment 1 Notify RN    Comment 2 Document in Chart   Glucose, capillary     Status: Abnormal   Collection Time: 08/13/15  8:34 PM  Result Value Ref Range   Glucose-Capillary 158 (H) 65 - 99 mg/dL  Glucose, capillary     Status: Abnormal   Collection Time: 08/14/15  5:23 AM  Result Value Ref Range   Glucose-Capillary 162 (H) 65 - 99 mg/dL  Lithium level     Status: Abnormal   Collection Time: 08/14/15  6:30 AM  Result Value Ref Range   Lithium Lvl 0.16 (L) 0.60 - 1.20 mmol/L    Comment: Performed at [facility]  Glucose, capillary     Status: Abnormal   Collection Time: 08/14/15 11:39 AM  Result Value Ref Range   Glucose-Capillary 201 (H) 65 - 99 mg/dL    Blood Alcohol level:  Lab  Results  Component Value Date   ETH 108* 08/01/2015   ETH 103* 04/17/2015    Physical Findings: AIMS: Facial and Oral Movements Muscles of Facial Expression: None, normal Lips and Perioral Area: None, normal Jaw: None, normal Tongue: None, normal,Extremity Movements Upper (arms, wrists, hands, fingers): None, normal Lower (legs, knees, ankles, toes): None, normal, Trunk Movements Neck, shoulders, hips: None, normal, Overall Severity Severity of abnormal movements (highest score from questions above): None, normal Incapacitation due to abnormal movements: None, normal Patient's awareness of abnormal movements (rate only patient's report): No Awareness, Dental Status Current problems with teeth and/or dentures?: No Does patient usually wear dentures?: No  CIWA:  CIWA-Ar Total: 1 COWS:  COWS Total Score: 5  Musculoskeletal: Strength & Muscle Tone: within normal limits Gait & Station: normal Patient leans: normal  Psychiatric Specialty Exam: Review of Systems  Psychiatric/Behavioral: Positive for depression, hallucinations and substance abuse. The patient is nervous/anxious and has insomnia (Improving).   All other systems reviewed and are negative.   Blood pressure 127/88, pulse 94, temperature 97.7 F (36.5 C), temperature source Oral, resp. rate 16, height  (1.575 m), weight 89.359 kg (197 lb), last menstrual period 08/05/2015, SpO2 100 %.Body mass index is 36.02 kg/(m^2).  General Appearance: Casual and Fairly Groomed  Patent attorney::  Good  Speech:  Clear and Coherent  Volume:  Normal  Mood:  Anxious and Depressed improving  Affect:  Anxious  Thought Process:  Coherent and Goal Directed  Orientation:  Full (Time, Place, and Person)  Thought Content:  Hallucinations: Auditory and Rumination negative voices , improving  Suicidal Thoughts:  No  Homicidal Thoughts:  No  Memory:  Immediate;   Good Recent;   Good Remote;   Good  Judgement:  Poor  Insight:  Lacking   Psychomotor Activity:  Normal  Concentration:  Fair  Recall:  Good  Fund of Knowledge:Fair  Language: Fair  Akathisia:  No  Handed:  Right  AIMS (if indicated):     Assets:  Communication Skills Desire for Improvement  ADL's:  Intact  Cognition: WNL  Sleep:  Number of Hours: 4.75   Treatment Plan Summary:Chere Cade Olberding is a 39 y.o.AA Female Tomikia who is single , unemployed , who was admitted to [facility] adult unit from the medical floor of the [facility] with complaints of suicide attempt by overdose on insulin, Metformin & alcohol intoxication.Pt continues to be depressed and anxious ,and has negative voices . Will continue treatment. Daily contact with patient to assess and evaluate symptoms and progress in treatment and Medication management   Continue supportive approach/coping skills Alcohol dependence; continue the Librium detox protocol/work a relapse prevention plan Depression; Increased the Remeron to 45 mg po qhs.                       Will increase Lithium to 300 mg po tid for mood sx, SI.Li level on 08/14/15- 0.16 mmols/l. Will get another Dierdre Searles level in 5 days - 08/18/15. Hallucinations; Will continue Abilify  25 mg po daily. Diabetic management as per Hospitalist/Diabetic coordinator recommendations. Neuropathic Pain:Will continue Neurontin 900 mg po qid for pain sx. Anxiety:  Continue Vistaril 25 mg PO  Q 6 hr PRN. Work with CBT/mindfulness Seasonal Allergic Rhinitis: Improved; Continue Claritin 10 mg PO  Daily. Reviewed labs- Hba1c- 11.5, lipid panel abnormal- will continue Zocor 20 mg po qpm  , tsh wnl,ekg - qtc -wnl. CSW will continue working on disposition.Referral to PATH services made. Patient to participate in therapeutic milieu .       Jermar Colter, MD 08/14/2015, 12:53 PM

## 2015-08-14 NOTE — Progress Notes (Signed)
D: Patient alert and oriented x 4. Patient denies pain/SI/HI/AVH. Patient had to be redirected multiple times to put clothes at bed time. Patient finally put on a shirt, but not pants. Patient refused HS Remeron and 2100 Lantus. Patient would not give reason just that she refused. During checks during night patient had bag of pretzels in bed that appeared to have been eaten.  A: Staff to monitor Q 15 mins for safety. Encouragement and support offered. Scheduled medications administered per orders. R: Patient remains safe on the unit. Patient attended group tonight. Patient visible on hte unit and interacting with peers. Patient taking administered medications.

## 2015-08-14 NOTE — Progress Notes (Signed)
Recreation Therapy Notes          03.03.2017  Approximately 8:45am Per MD order LRT met with patient to investigate ways to enhance tx during admission. Patient shared that since her admission 11.2106 her depression has increased, specifically due to the passing of her mother. Patient mother pass "a couple of months ago" from COPD. Subsequent to her mothers death patients alcohol intake increased, patient described herself as an "alcoholic." Patient shared she primarily feels lonely and lost, as she does not have anyone to talk to any longer now that her mother has passed. Which ultimately resulted in patient attempting suicide via overdoes on prescribed insulin. Drinking was only identified coping skill. Patient identified word finds and cooking as leisure interest of hers. Patient unable to identify specific goal for tx, simply stating that she wants to change her life. Patient did relate this to continuing tx for alcoholism. Patient shared that following d/c she wants to move in with her niece who lives in [place].   LRT introduced diaphragmatic breathing and mindful breathing to patient. LRT educated patient on use, to ease cravings for alcohol and to calm herself when she experiences stress post d/c. Patient and LRT practiced both techniques, LRT educated patient about benefit and uses of both techniques. Patient acknowledged education and expressed understanding. Patient reported feeling calm and relaxed upon completion of practicing both diaphragmatic breathing and mindful breathing. Patient asked for handouts and literature on additional stress management techniques she can use in and outpatient. Patient reports she learns better through reading. LRT to provide.   Approximately 10:10am LRT returned with requested literature. Patient provided information on progressive muscle relaxation, diaphragmatic breathing, mindfulness and deep breathing.   Laureen Ochs Recia Sons, LRT/CTRS   Michole Lecuyer,  Nashon Erbes L 08/14/2015 10:14 AM

## 2015-08-14 NOTE — Progress Notes (Addendum)
Carolyn Ayala is med compliant today. She has taken ALL scheduled meds as planned, she has allowed nursing to guide her when choosing her snacks and her meals and she has taken lunch and suppetime sliding scale insulins as well as meal coverage insulins. A She completed her daily assessment and on ti she wrote she denied SI and she rated her depression, hopelessness and axneity " 02/20/09", respectively. R She is attending her groups and  Safety is in place.

## 2015-08-14 NOTE — [facility] Group Notes (Signed)
[facility] LCSW Group Therapy  08/14/2015 1:15pm  Type of Therapy:  Group Therapy vercoming Obstacles  Participation Level:  Minimal  Participation Quality:  Reserved  Affect:  Appropriate  Cognitive:  Appropriate and Oriented  Insight:  Limited  Engagement in Therapy:  Improving  Modes of Intervention:  Discussion, Exploration, Problem-solving and Support  Description of Group:   In this group patients will be encouraged to explore what they see as obstacles to their own wellness and recovery. They will be guided to discuss their thoughts, feelings, and behaviors related to these obstacles. The group will process together ways to cope with barriers, with attention given to specific choices patients can make. Each patient will be challenged to identify changes they are motivated to make in order to overcome their obstacles. This group will be process-oriented, with patients participating in exploration of their own experiences as well as giving and receiving support and challenge from other group members.  Summary of Patient Progress: Pt initially asked to pass on participation, however, with prompting she identified moving to [place] with family as her goal.   Therapeutic Modalities:   Cognitive Behavioral Therapy Solution Focused Therapy Motivational Interviewing Relapse Prevention Therapy   Chad CordialLauren Carter, LCSWA 08/14/2015 3:57 PM

## 2015-08-14 NOTE — [facility] Group Notes (Signed)
[facility] L[facility] After[facility] Dis[facility] Planning Group Note  08/14/2015 8:45 AM  Pt attended group but declined to participate.   Chad CordialLauren Carter, LCSWA 08/14/2015 1:39 PM

## 2015-08-14 NOTE — Clinical Social Work Note (Signed)
PASARR screen completed.  Santa GeneraAnne Guenevere Roorda, LCSW Lead Clinical Social Worker Phone:  [phone]

## 2015-08-14 NOTE — Tx Team (Signed)
Interdisciplinary Treatment Plan Update (Adult) Date: 08/14/2015    Time Reviewed: 9:30 AM  Progress in Treatment: Attending groups: Yes Participating in groups: Minimally Taking medication as prescribed: Yes Tolerating medication: Yes Family/Significant other contact made: No, Pt declines Patient understands diagnosis: Yes Discussing patient identified problems/goals with staff: Yes Medical problems stabilized or resolved: Yes Denies suicidal/homicidal ideation: Yes Issues/concerns per patient self-inventory: Yes Other:  New problem(s) identified: N/A  Discharge Plan or Barriers: CSW continuing to assess, patient new to milieu.   08/11/15: Discharge plan still not established. Pt has no income and has declined/is not eligible for substance abuse facilities. She has no income and her diabetes is unmanaged.   08/14/15: Pt plans to relocate to [place] to live with family.  Reason for Continuation of Hospitalization:  Depression Anxiety Medication Stabilization   Comments: N/A  Estimated length of stay: 1-3 days    Patient is a 40 year old female with a history of Major Depressive Disorder and Bipolar Disorder. Pt presented to the hospital with depression, anxiety, alcohol intoxication, and suicidal ideations. Pt reports primary trigger(s) for admission were a recent break up and family death. Patient will benefit from crisis stabilization, medication evaluation, group therapy and psycho education in addition to case management for discharge planning. At discharge, it is recommended that Pt remain compliant with established discharge plan and continued treatment.  Continue the Librium detox protocol/work a relapse prevention plan Continue the Remeron 30 mg po qhs , consider increasing to 45. No increase at this time. Add Lithium 150 mg po bid for mood sx, SI.Li level in 5 days. Continue Abilify 20 mg po daily. Continue Vistaril 25 mg PO Q 6 hr PRN   Review of  initial/current patient goals per problem list:  1. Goal(s): Patient will participate in aftercare plan   Met: Yes   Target date: 3-5 days post admission date   As evidenced by: Patient will participate within aftercare plan AEB aftercare provider and housing plan at discharge being identified.  2/23: Goal not met: CSW assessing for appropriate referrals for pt and will have follow up secured prior to d/c.  2/28: Discharge plan undetermined. CSW assessing for appropriate referrals.   08/15/15: Pt plans to relocate to [place] to live with family and follow-up with outpatient resources.    2. Goal (s): Patient will exhibit decreased depressive symptoms and suicidal ideations.   Met: Adequate for DC   Target date: 3-5 days post admission date   As evidenced by: Patient will utilize self rating of depression at 3 or below and demonstrate decreased signs of depression or be deemed stable for discharge by MD.  2/23: Goal not met: Pt presents with flat affect and depressed mood.  Pt admitted with depression rating of 10.  Pt to show decreased sign of depression and a rating of 3 or less before d/c.    2/28: Pt rates depression at 9/10; passive SI  08/14/15: MD feels that [name] symptoms have decreased to the point that they can be managed in an outpatient setting.    3. Goal(s): Patient will demonstrate decreased signs and symptoms of anxiety.   Met: No   Target date: 3-5 days post admission date   As evidenced by: Patient will utilize self rating of anxiety at 3 or below and demonstrated decreased signs of anxiety, or be deemed stable for discharge by MD  2/23: Goal not met: Pt presents with anxious mood and affect.  Pt admitted with anxiety rating of 10.  Pt to show decreased sign of anxiety and a rating of 3 or less before d/c  2/28: Pt rates anxiety at 10/10  3/3: Pt continues to rate anxiety at high levels   4. Goal(s): Patient will demonstrate decreased signs of withdrawal  due to substance abuse   Met: Yes   Target date: 3-5 days post admission date   As evidenced by: Patient will produce a CIWA/COWS score of 0, have stable vitals signs, and no symptoms of withdrawal   2/23: Goal met. No withdrawal symptoms reported at this time per medical chart.     Attendees: Patient:    Family:    Physician: Dr. Shea Evans 08/06/2015 9:30 AM  Nursing:Patricia Duke, RN; Larrie Kass., RN 08/06/2015 9:30 AM  Clinical Social Worker: Peri Maris, Elizabeth 08/06/2015 9:30 AM  Other:  08/06/2015 9:30 AM  Other:  08/06/2015 9:30 AM  Other:  08/06/2015 9:30 AM  Other:  08/06/2015 9:30 AM  Other:           Scribe for Treatment Team:  Peri Maris, [place] Work [phone]

## 2015-08-14 NOTE — Progress Notes (Addendum)
Inpatient Diabetes Program Recommendations  AACE/ADA: New Consensus Statement on Inpatient Glycemic Control (2015)  Target Ranges:  Prepandial:   less than 140 mg/dL      Peak postprandial:   less than 180 mg/dL (1-2 hours)      Critically ill patients:  140 - 180 mg/dL   Results for Lenard GallowayXXXCARDENAS, Malley DENISE (MRN [id number]) as of 08/14/2015 13:51  Ref. Range 08/12/2015 06:15 08/12/2015 11:53 08/12/2015 17:03 08/12/2015 20:37  Glucose-Capillary Latest Ref Range: 65-99 mg/dL 782241 (H) 956254 (H) 213307 (H) 241 (H)   Results for Lenard GallowayXXXCARDENAS, Kimberly DENISE (MRN [id number]) as of 08/14/2015 13:51  Ref. Range 08/13/2015 06:15 08/13/2015 11:40 08/13/2015 16:55 08/13/2015 20:34  Glucose-Capillary Latest Ref Range: 65-99 mg/dL 629158 (H) 528131 (H) 413246 (H) 158 (H)   Results for Lenard GallowayXXXCARDENAS, Hazyl DENISE (MRN [id number]) as of 08/14/2015 13:51  Ref. Range 08/14/2015 05:23 08/14/2015 11:39  Glucose-Capillary Latest Ref Range: 65-99 mg/dL 536162 (H) 644201 (H)   Home DM Meds: Lantus 55 units bid  Novolog 15 units tidwc  Metformin 1000 mg bid  Current Insulin Orders: Lantus 60 units bid      Novolog Moderate Correction Scale/ SSI (0-15 units) TID AC + HS      Novolog 15 units tidwc      Metformin 1000 mg bid     MD- Note patient refused Lantus 60 units last night at bedtime.  Surprisingly, her AM CBG today was OK at 162 mg/dl.  This trend tends to make me think patient is currently over-basalized here in the hospital setting.  Some of the basal insulin she [facility] may be covering her food intake as well.  Note patient tends to eat a lot of carbohydrates and snacks between meals as well.  Please consider reducing Lantus to 45 units bid (20% overall reduction of current dose)  If you note that patient continues to have elevated CBGs after meals, may need to increase Novolog Meal Coverage instead of increasing Lantus insulin.      --Will follow  patient during hospitalization--  Ambrose FinlandJeannine Johnston Lezette Kitts RN, MSN, CDE Diabetes Coordinator Inpatient Glycemic Control Team Team Pager: [phone] (8a-5p)

## 2015-08-15 LAB — GLUCOSE, CAPILLARY
Glucose-Capillary: 263 mg/dL — ABNORMAL HIGH (ref 65–99)
Glucose-Capillary: 211 mg/dL — ABNORMAL HIGH (ref 65–99)
Glucose-Capillary: 367 mg/dL — ABNORMAL HIGH (ref 65–99)
Glucose-Capillary: 150 mg/dL — ABNORMAL HIGH (ref 65–99)

## 2015-08-15 MED ORDER — HYDROXYZINE HCL 50 MG PO TABS
50.0000 mg | ORAL_TABLET | Freq: Four times a day (QID) | ORAL | Status: DC | PRN
  Administered 2015-08-15 – 2015-08-17 (×5): 50 mg via ORAL
  Filled 2015-08-15 (×4): qty 1
  Filled 2015-08-15: qty 10
  Filled 2015-08-15: qty 1

## 2015-08-15 NOTE — Progress Notes (Signed)
Chapman Fitch. Tomeika presents with irritable mood, affect labile. She appears responding to internal stimuli and was very guarded and irritable upon approach this morning. When Clinical research associatewriter offered medication she states '' i only want the pills, no insulin. '' she refused her lantus and when writer attempted to educate her about this she states '' I already took insulin, i'm not taking no other shots . '' she then went down the hall stating '' I didn't say soul food '' appearing to be responding to internal stimuli. She is easily irritable today. She denies any SI/HI /A/V Hallucinations. She completed her self inventory and wrote that she '' need to go up on my vistaril '' and rates her depression, anxiety and hopeless at 9/10 on scale, 10 being worst. She wrote on last column of inventory '' i claim it '' A. Medications given as ordered, support encouragement provided. R. Patient is safe, will con't to monitor q 15 minutes for safety.

## 2015-08-15 NOTE — Progress Notes (Signed)
Patient ID: Carolyn Ayala, female   DOB: 24-Nov-1975, 40 y.o.   MRN: [id number] D: Patient evening CBG was 81. Pt reports feeling uneasy stating her body was not use to a low sugar level. Pt encouraged to have snack and ginger ale. CBG checked after an hour. CBG was 207. Pt stated she has refused her insulin in the past because of fear of hypoglycemia. Pt denies SI/HI/AVH. Pt attended and participated in evening wrap up group. Cooperative with assessment.   A: Met with pt 1:1.  All scheduled medications administered as prescribed. Support and encouragement provided. Pt encouraged to discuss feelings and come to staff with any question or concerns.  R: Patient remains safe and complaint with medications. Will continue to monitor and assess pt through the night.

## 2015-08-15 NOTE — Progress Notes (Signed)
Patient ID: Carolyn Ayala, female   DOB: 17-Dec-1975, 40 y.o.   MRN: [id number] [facility] MD Progress Note  08/15/2015 3:55 PM Carolyn Ayala  MRN:  [id number]   Subjective: Carolyn Ayala reports, "I have bad anxiety, a lot of it. It started since my mama died, can you do something to help me with this?"  Objective:Carolyn Ayala is a 40 y.o.AA Female.Carolyn Ayala who is single, unemployed, who was admitted to [facility] adult unit from the medical floor of the [facility] with complaints of suicide attempt by overdose on insulin, Metformin & alcohol intoxication. She was recently discharged from this hospital on 07-09-15 after receiving alcohol detoxification & mood stabilization treatments. She was discharged to follow-up care at the [facility] both in [place], [place].   Patient seen, chart reviewed. Discussed patient with treatment team. Carolyn Ayala is visible on the unit. She is participating in the group milieu. She says she is having bad anxiety. She is currently on Vistaril 25 mg. Will increase to 50 mg prn for anxiety. She says she may be going home on Monday 08-17-15 after discharge, concerned she may not be able to handle her anxiety on Vistaril 25 mg. She is cooperative on the unit, tolerating her treatment regimen. Denies any adverse effects.  Principal Problem: Depression, major, recurrent, severe with psychosis (HCC)  Diagnosis:   Patient Active Problem List   Diagnosis Date Noted  . Diabetes mellitus without complication (HCC) [E11.9]   . Hyperlipidemia [E78.5] 08/10/2015  . Alcohol use disorder, moderate, dependence (HCC) [F10.20] 08/10/2015  . Neuropathic pain [M79.2] 08/08/2015  . Seasonal allergic rhinitis [J30.2] 08/08/2015  . Anxiety state [F41.1] 08/08/2015  . 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 Uncontrolled diabetes mellitus with neurologic complication, with long-term current use of insulin (HCC) [E11.49,  Z79.4, E11.65]   . Uncontrolled diabetes mellitus with diabetic neuropathy, with long-term current use of insulin (HCC) [E11.40, Z79.4, E11.65] 04/21/2015  . Generalized anxiety disorder [F41.1] 02/13/2013  . Chronic posttraumatic stress disorder [F43.12] 02/13/2013  . Substance induced mood disorder (HCC) [F19.94] 02/08/2013  . IDDM (insulin dependent diabetes mellitus) (HCC) [E11.9, Z79.4] 02/08/2013   Total Time spent with patient: 15 minutes  Past Psychiatric History: see admission H and P  Past Medical History:  Past Medical History  Diagnosis Date  . Mental disorder   . Depression   . Diabetes mellitus without complication (HCC)   . Hypertension     Past Surgical History  Procedure Laterality Date  . Cesarean section     Family History:  Family History  Problem Relation Age of Onset  . Depression Mother    Family Psychiatric  History: see admission H and P Social History:  History  Alcohol Use  . Yes    Comment: daily     History  Drug Use No    Social History   Social History  . Marital Status: Single    Spouse Name: N/A  . Number of Children: N/A  . Years of Education: N/A   Social History Main Topics  . Smoking status: Never Smoker   . Smokeless tobacco: Current User  . Alcohol Use: Yes     Comment: daily  . Drug Use: No  . Sexual Activity: No   Other Topics Concern  . None   Social History Narrative   ** Merged History Encounter **       Additional Social History:    Pain Medications: pt denies abuse Prescriptions:  pt denies abuse Over the Counter: pt denies abuse History of alcohol / drug use?: Yes Longest period of sobriety (when/how long): unknown Negative Consequences of Use: Personal relationships Withdrawal Symptoms: Tremors  Sleep: Good  Appetite:  Good  Current Medications: Current Facility-Administered Medications  Medication Dose Route Frequency Provider Last Rate Last Dose  . acetaminophen (TYLENOL) tablet 650 mg  650  mg Oral Q6H PRN Adonis BrookSheila Agustin, NP   650 mg at 08/11/15 0604  . alum & mag hydroxide-simeth (MAALOX/MYLANTA) 200-200-20 MG/5ML suspension 30 mL  30 mL Oral Q4H PRN Adonis BrookSheila Agustin, NP      . ARIPiprazole (ABILIFY) tablet 25 mg  25 mg Oral Daily Jomarie LongsSaramma Eappen, MD   25 mg at 08/15/15 0744  . cyclobenzaprine (FLEXERIL) tablet 10 mg  10 mg Oral TID PRN Rachael FeeIrving A Lugo, MD   10 mg at 08/15/15 1550  . fluticasone (FLONASE) 50 MCG/ACT nasal spray 1 spray  1 spray Each Nare Daily PRN Adonis BrookSheila Agustin, NP      . gabapentin (NEURONTIN) capsule 900 mg  900 mg Oral TID PC & HS Saramma Eappen, MD   900 mg at 08/15/15 1157  . hydrOXYzine (ATARAX/VISTARIL) tablet 50 mg  50 mg Oral Q6H PRN Sanjuana KavaAgnes I Nwoko, NP      . ibuprofen (ADVIL,MOTRIN) tablet 800 mg  800 mg Oral Q8H PRN Jomarie LongsSaramma Eappen, MD   800 mg at 08/15/15 0748  . insulin aspart (novoLOG) injection 0-15 Units  0-15 Units Subcutaneous TID WC Sanjuana KavaAgnes I Nwoko, NP   5 Units at 08/15/15 1153  . insulin aspart (novoLOG) injection 0-5 Units  0-5 Units Subcutaneous QHS Sanjuana KavaAgnes I Nwoko, NP   2 Units at 08/12/15 2255  . insulin aspart (novoLOG) injection 15 Units  15 Units Subcutaneous TID WC Jomarie LongsSaramma Eappen, MD   15 Units at 08/15/15 1157  . insulin glargine (LANTUS) injection 60 Units  60 Units Subcutaneous BID Sanjuana KavaAgnes I Nwoko, NP   60 Units at 08/15/15 1157  . lithium carbonate capsule 300 mg  300 mg Oral TID WC Jomarie LongsSaramma Eappen, MD   300 mg at 08/15/15 1157  . loratadine (CLARITIN) tablet 10 mg  10 mg Oral Daily Shuvon B Rankin, NP   10 mg at 08/15/15 0745  . magnesium hydroxide (MILK OF MAGNESIA) suspension 30 mL  30 mL Oral Daily PRN Adonis BrookSheila Agustin, NP      . metFORMIN (GLUCOPHAGE) tablet 1,000 mg  1,000 mg Oral BID WC Adonis BrookSheila Agustin, NP   1,000 mg at 08/15/15 0745  . mirtazapine (REMERON) tablet 45 mg  45 mg Oral QHS Jomarie LongsSaramma Eappen, MD   45 mg at 08/14/15 2309  . multivitamin with minerals tablet 1 tablet  1 tablet Oral Daily Rachael FeeIrving A Lugo, MD   1 tablet at 08/15/15 0746  .  simvastatin (ZOCOR) tablet 20 mg  20 mg Oral q1800 Jomarie LongsSaramma Eappen, MD   20 mg at 08/14/15 1814  . thiamine (VITAMIN B-1) tablet 100 mg  100 mg Oral Daily Rachael FeeIrving A Lugo, MD   100 mg at 08/15/15 0745   Lab Results:  Results for orders placed or performed during the hospital encounter of 08/05/15 (from the past 48 hour(s))  Glucose, capillary     Status: Abnormal   Collection Time: 08/13/15  4:55 PM  Result Value Ref Range   Glucose-Capillary 246 (H) 65 - 99 mg/dL   Comment 1 Notify RN    Comment 2 Document in Chart   Glucose, capillary  Status: Abnormal   Collection Time: 08/13/15  8:34 PM  Result Value Ref Range   Glucose-Capillary 158 (H) 65 - 99 mg/dL  Glucose, capillary     Status: Abnormal   Collection Time: 08/14/15  5:23 AM  Result Value Ref Range   Glucose-Capillary 162 (H) 65 - 99 mg/dL  Lithium level     Status: Abnormal   Collection Time: 08/14/15  6:30 AM  Result Value Ref Range   Lithium Lvl 0.16 (L) 0.60 - 1.20 mmol/L    Comment: Performed at [facility]  Glucose, capillary     Status: Abnormal   Collection Time: 08/14/15 11:39 AM  Result Value Ref Range   Glucose-Capillary 201 (H) 65 - 99 mg/dL  Glucose, capillary     Status: Abnormal   Collection Time: 08/14/15  5:07 PM  Result Value Ref Range   Glucose-Capillary 271 (H) 65 - 99 mg/dL  Glucose, capillary     Status: None   Collection Time: 08/14/15  8:27 PM  Result Value Ref Range   Glucose-Capillary 81 65 - 99 mg/dL  Glucose, capillary     Status: Abnormal   Collection Time: 08/14/15 10:04 PM  Result Value Ref Range   Glucose-Capillary 207 (H) 65 - 99 mg/dL  Glucose, capillary     Status: Abnormal   Collection Time: 08/15/15  5:43 AM  Result Value Ref Range   Glucose-Capillary 263 (H) 65 - 99 mg/dL  Glucose, capillary     Status: Abnormal   Collection Time: 08/15/15 11:51 AM  Result Value Ref Range   Glucose-Capillary 211 (H) 65 - 99 mg/dL    Blood Alcohol level:  Lab Results   Component Value Date   ETH 108* 08/01/2015   ETH 103* 04/17/2015   Physical Findings: AIMS: Facial and Oral Movements Muscles of Facial Expression: None, normal Lips and Perioral Area: None, normal Jaw: None, normal Tongue: None, normal,Extremity Movements Upper (arms, wrists, hands, fingers): None, normal Lower (legs, knees, ankles, toes): None, normal, Trunk Movements Neck, shoulders, hips: None, normal, Overall Severity Severity of abnormal movements (highest score from questions above): None, normal Incapacitation due to abnormal movements: None, normal Patient's awareness of abnormal movements (rate only patient's report): No Awareness, Dental Status Current problems with teeth and/or dentures?: No Does patient usually wear dentures?: No  CIWA:  CIWA-Ar Total: 0 COWS:  COWS Total Score: 5  Musculoskeletal: Strength & Muscle Tone: within normal limits Gait & Station: normal Patient leans: normal  Psychiatric Specialty Exam: Review of Systems  Psychiatric/Behavioral: Positive for depression, hallucinations and substance abuse. The patient is nervous/anxious and has insomnia (Improving).   All other systems reviewed and are negative.   Blood pressure 130/83, pulse 100, temperature 98.5 F (36.9 C), temperature source Oral, resp. rate 16, height 5\' 2"  (1.575 m), weight 89.359 kg (197 lb), last menstrual period 08/05/2015, SpO2 100 %.Body mass index is 36.02 kg/(m^2).  General Appearance: Casual and Fairly Groomed  Eye Contact::  Good  Speech:  Clear and Coherent  Volume:  Normal  Mood:  Anxious and Depressed,  improving  Affect:  Anxious  Thought Process:  Coherent and Goal Directed  Orientation:  Full (Time, Place, and Person)  Thought Content:  Rumination , improving  Suicidal Thoughts:  No  Homicidal Thoughts:  No  Memory:  Immediate;   Good Recent;   Good Remote;   Good  Judgement:  Poor  Insight:  Lacking  Psychomotor Activity:  "I have bad anxiety"   Concentration:  Fair  Recall:  [place] Major of Knowledge:Fair  Language: Fair  Akathisia:  No  Handed:  Right  AIMS (if indicated):     Assets:  Communication Skills Desire for Improvement  ADL's:  Intact  Cognition: WNL  Sleep:  6.0   Treatment Plan Summary: Jasmeet Manton is a 41 y.o.AA Female Carolyn Ayala who is single , unemployed , who was admitted to [facility] adult unit from the medical floor of the [facility] with complaints of suicide attempt by overdose on insulin, Metformin & alcohol intoxication.Pt continues to be depressed and anxious ,and has negative voices . Will continue treatment.  Daily contact with patient to assess and evaluate symptoms and progress in treatment and Medication management: Continue supportive approach/coping skills Alcohol dependence; continue to work a relapse prevention plan Depression; Continue the Remeron to 45 mg po qhs. Will continue Lithium 300 mg po tid for mood sx, SI.Li level on 08/14/15- 0.16 mmols/l. Will get another Dierdre Searles level in 5 days - 08/18/15. Hallucinations; Will continue Abilify  25 mg po daily. Diabetic management as per Hospitalist/Diabetic coordinator recommendations. Neuropathic Pain:Will continue Neurontin 900 mg po qid for pain sx. Anxiety: Increased Vistaril to 50 mg PO  Q 6 hr PRN due to increased anxiety level. Work with CBT/mindfulness Seasonal Allergic Rhinitis: Improved; Continue Claritin 10 mg PO  Daily. Reviewed labs- Hba1c- 11.5, lipid panel abnormal- will continue Zocor 20 mg po qpm , tsh wnl, ekg - qtc -wnl. CSW will continue working on disposition. Referral to [facility] services made. Patient to participate in therapeutic milieu. Continue current plan of care  Sanjuana Kava, NP, PMHNP,  08/15/2015, 3:55 PM   Agree with NP Progress Note, as above  Nehemiah Massed, MD

## 2015-08-15 NOTE — [facility] Group Notes (Signed)
Adult Psychoeducational Group Note  Date:  08/15/2015 Time:  12:32 AM  Group Topic/Focus:  Wrap-Up Group:   The focus of this group is to help patients review their daily goal of treatment and discuss progress on daily workbooks.  Participation Level:  Active  Participation Quality:  Appropriate  Affect:  Appropriate  Cognitive:  Appropriate  Insight: Appropriate  Engagement in Group:  Engaged  Modes of Intervention:  Discussion  Additional Comments:  Pt stated her day was pretty good and rated it a 10.  She stated she talked to her son because it was his birthday.  She also stated she talked to the doctor.  Her goal was to stay focused.  Caroll RancherLindsay, Jalyn Rosero A 08/15/2015, 12:32 AM

## 2015-08-15 NOTE — [facility] Group Notes (Signed)
[facility] Group Notes: (Clinical Social Work)  08/15/2015 11:15-12:00PM  Summary of Progress/Problems: Today's process group involved patients discussing their feelings related to being hospitalized, as well as how they can avoid future hospitalizations. An exploration of key elements of wellness included medications, adherence, use of doctors, use of therapy, sleep hygiene, and a number of healthy coping skills that various patients have tried successfully. The patient stated her overall feeling about being hospitalized is "comfortable but I am ready to leave."  She was reading/coloring throughout group and was not really engaged, eventually left.  Type of Therapy: Group Therapy - Process  Participation Level: Minimal  Participation Quality: Inattentive  Affect: Blunted  Cognitive: Disorganized  Insight: Improving  Engagement in Therapy: Improving  Modes of Intervention: Exploration, Discussion  Ambrose MantleMareida Grossman-Orr, LCSW 08/15/2015, 12:14 PM

## 2015-08-15 NOTE — Progress Notes (Signed)
Adult Psychoeducational Group Note  Date:  08/15/2015 Time:  9:44 PM  Group Topic/Focus:  Wrap-Up Group:   The focus of this group is to help patients review their daily goal of treatment and discuss progress on daily workbooks.  Participation Level:  Active  Participation Quality:  Appropriate  Affect:  Appropriate  Cognitive:  Appropriate  Insight: Appropriate  Engagement in Group:  Engaged  Modes of Intervention:  Discussion  Additional Comments: The patient expressed that she attended groups and had a good day.  Octavio Mannshigpen, [place] Pettengill Lee 08/15/2015, 9:44 PM

## 2015-08-15 NOTE — Plan of Care (Signed)
Problem: Alteration in mood & ability to function due to Goal: STG-Patient will attend groups Outcome: Progressing Pt attended and engaged in evening wrap up group     

## 2015-08-15 NOTE — Plan of Care (Signed)
Problem: Alteration in mood Goal: STG-Patient is able to discuss feelings and issues (Patient is able to discuss feelings and issues leading to depression)  Outcome: Progressing Carolyn Ayala is able to report how she is feeling, wrote on self inventory today that she '' needs more vistaril '' and was able to rate her depression.

## 2015-08-15 NOTE — [facility] Group Notes (Signed)
[facility] Group Notes:  (Nursing/MHT/Case Management/Adjunct)  Date:  08/15/2015  Time:  9am  Type of Therapy:  Psychoeducational Skills--healthy coping skills  Participation Level:  Minimal  Participation Quality:  Resistant  Affect:  Irritable and Labile  Cognitive:  Disorganized and Lacking  Insight:  Lacking and Limited  Engagement in Group:  Lacking and Limited  Modes of Intervention:  Discussion, Education and Exploration  Summary of Progress/Problems: discussed healthy coping skills and ways to utilize to maintain stress. When it came time for Grayson to share she refused stating '' pass me ''   Malva LimesStrader, Jasiyah Poland 08/15/2015, 11:05 AM

## 2015-08-16 LAB — GLUCOSE, CAPILLARY
Glucose-Capillary: 226 mg/dL — ABNORMAL HIGH (ref 65–99)
Glucose-Capillary: 265 mg/dL — ABNORMAL HIGH (ref 65–99)
Glucose-Capillary: 282 mg/dL — ABNORMAL HIGH (ref 65–99)
Glucose-Capillary: 184 mg/dL — ABNORMAL HIGH (ref 65–99)

## 2015-08-16 MED ORDER — METHOCARBAMOL 750 MG PO TABS: 750.0000 mg | ORAL_TABLET | Freq: Three times a day (TID) | ORAL | Status: DC | PRN

## 2015-08-16 NOTE — Progress Notes (Signed)
DAR NOTE: Pt present with flat affect and depressed mood in the unit. Pt has been visible in the dayroom interacting with peers. Pt denies physical pain, took all her meds as scheduled. As per self inventory, pt had a good night sleep, fair appetite, low energy, and poor concentration. Pt rate depression at 9, hopeless ness at 9, and anxiety 9. [name] safety ensured with 15 minute and environmental checks. Pt currently denies SI/HI and A/V hallucinations. Pt verbally agrees to seek staff if SI/HI or A/VH occurs and to consult with staff before acting on these thoughts. Will continue POC.

## 2015-08-16 NOTE — Progress Notes (Signed)
Adult Psychoeducational Group Note  Date:  08/16/2015 Time:  9:15 PM  Group Topic/Focus:  Wrap-Up Group:   The focus of this group is to help patients review their daily goal of treatment and discuss progress on daily workbooks.  Participation Level:  Active  Participation Quality:  Appropriate  Affect:  Appropriate  Cognitive:  Appropriate  Insight: Appropriate  Engagement in Group:  Engaged  Modes of Intervention:  Discussion  Additional Comments: The patient expressed that she attended groups.The patient also said that she needs to stay positive at discharge.  Octavio Mannshigpen, Mirra Basilio Lee 08/16/2015, 9:15 PM

## 2015-08-16 NOTE — Progress Notes (Signed)
Patient ID: Carolyn Ayala, female   DOB: 07-08-1975, 40 y.o.   MRN: [id number] Patient ID: Carolyn Ayala, female   DOB: 07-08-1975, 40 y.o.   MRN: [id number] [facility] Progress Note  08/16/2015 5:05 PM Saria Delton PrairieDenise Ayala  MRN:  [id number]   Subjective: Laraine reports, "I'm doing all right except my muscle spasms. The Flexeril ain't touching it. I need something stronger"  Objective:Ladene Lucy AntiguaDenise Mensing is a 40 y.o.AA Female.Tomikia who is single, unemployed, who [facility] floor [facility] with complaints of suicide attempt by overdose on insulin, Metformin & alcohol intoxication. She was recently discharged from this hospital on 07-09-15 after receiving alcohol detoxification & mood stabilization treatments. She was discharged to [facility] [place], [place]   Patient seen, chart reviewed. Discussed patient with treatment team. [name] is visible on the unit. She is participating in the group milieu. She says she is having bad muscle spasms. She is currently on Flexeril 10 mg prn which she says does not help. Will discontinue Flexeril, start Robaxin 750 mg prn tid. She says she may be going home on Monday 08-17-15 after discharge. She is cooperative on the unit, tolerating her treatment regimen. Denies any adverse effects. Denies SIHI, AVH.  Principal Problem: Depression, major, recurrent, severe with psychosis (HCC)  Diagnosis:   Patient Active Problem List   Diagnosis Date Noted  . Diabetes mellitus without complication (HCC) [E11.9]   . Hyperlipidemia [E78.5] 08/10/2015  . Alcohol use disorder, moderate, dependence (HCC) [F10.20] 08/10/2015  . Neuropathic pain [M79.2] 08/08/2015  . Seasonal allergic rhinitis [J30.2] 08/08/2015  . Anxiety state [F41.1] 08/08/2015  . 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 Uncontrolled diabetes  mellitus with neurologic complication, with long-term current use of insulin (HCC) [E11.49, Z79.4, E11.65]   . Uncontrolled diabetes mellitus with diabetic neuropathy, with long-term current use of insulin (HCC) [E11.40, Z79.4, E11.65] 04/21/2015  . Generalized anxiety disorder [F41.1] 02/13/2013  . Chronic posttraumatic stress disorder [F43.12] 02/13/2013  . Substance induced mood disorder (HCC) [F19.94] 02/08/2013  . IDDM (insulin dependent diabetes mellitus) (HCC) [E11.9, Z79.4] 02/08/2013   Total Time spent with patient: 15 minutes  Past Psychiatric History: see admission H and P  Past Medical History:  Past Medical History  Diagnosis Date  . Mental disorder   . Depression   . Diabetes mellitus without complication (HCC)   . Hypertension     Past Surgical History  Procedure Laterality Date  . Cesarean section     Family History:  Family History  Problem Relation Age of Onset  . Depression Mother    Family Psychiatric  History: see admission H and P Social History:  History  Alcohol Use  . Yes    Comment: daily     History  Drug Use No    Social History   Social History  . Marital Status: Single    Spouse Name: N/A  . Number of Children: N/A  . Years of Education: N/A   Social History Main Topics  . Smoking status: Never Smoker   . Smokeless tobacco: Current User  . Alcohol Use: Yes     Comment: daily  . Drug Use: No  . Sexual Activity: No   Other Topics Concern  . None   Social History Narrative   ** Merged History Encounter **       Additional Social History:  Pain Medications: pt denies abuse Prescriptions: pt denies abuse Over the Counter: pt denies abuse History of alcohol / drug use?: Yes Longest period of sobriety (when/how long): unknown Negative Consequences of Use: Personal relationships Withdrawal Symptoms: Tremors  Sleep: Good  Appetite:  Good  Current Medications: Current Facility-Administered Medications  Medication Dose  Route Frequency Provider Last Rate Last Dose  . acetaminophen (TYLENOL) tablet 650 mg  650 mg Oral Q6H PRN Adonis Brook, NP   650 mg at 08/11/15 0604  . alum & mag hydroxide-simeth (MAALOX/MYLANTA) 200-200-20 MG/5ML suspension 30 mL  30 mL Oral Q4H PRN Adonis Brook, NP      . ARIPiprazole (ABILIFY) tablet 25 mg  25 mg Oral Daily Jomarie Longs, MD   25 mg at 08/16/15 0752  . fluticasone (FLONASE) 50 MCG/ACT nasal spray 1 spray  1 spray Each Nare Daily PRN Adonis Brook, NP      . gabapentin (NEURONTIN) capsule 900 mg  900 mg Oral TID PC & HS Saramma Eappen, MD   900 mg at 08/16/15 1703  . hydrOXYzine (ATARAX/VISTARIL) tablet 50 mg  50 mg Oral Q6H PRN Sanjuana Kava, NP   50 mg at 08/16/15 1301  . ibuprofen (ADVIL,MOTRIN) tablet 800 mg  800 mg Oral Q8H PRN Jomarie Longs, MD   800 mg at 08/16/15 1438  . insulin aspart (novoLOG) injection 0-15 Units  0-15 Units Subcutaneous TID WC Sanjuana Kava, NP   8 Units at 08/16/15 1702  . insulin aspart (novoLOG) injection 0-5 Units  0-5 Units Subcutaneous QHS Sanjuana Kava, NP   2 Units at 08/12/15 2255  . insulin aspart (novoLOG) injection 15 Units  15 Units Subcutaneous TID WC Jomarie Longs, MD   15 Units at 08/16/15 1702  . insulin glargine (LANTUS) injection 60 Units  60 Units Subcutaneous BID Sanjuana Kava, NP   60 Units at 08/16/15 0755  . lithium carbonate capsule 300 mg  300 mg Oral TID WC Saramma Eappen, MD   300 mg at 08/16/15 1655  . loratadine (CLARITIN) tablet 10 mg  10 mg Oral Daily Shuvon B Rankin, NP   10 mg at 08/16/15 0752  . magnesium hydroxide (MILK OF MAGNESIA) suspension 30 mL  30 mL Oral Daily PRN Adonis Brook, NP      . metFORMIN (GLUCOPHAGE) tablet 1,000 mg  1,000 mg Oral BID WC Adonis Brook, NP   1,000 mg at 08/16/15 1655  . methocarbamol (ROBAXIN) tablet 750 mg  750 mg Oral Q8H PRN Sanjuana Kava, NP      . mirtazapine (REMERON) tablet 45 mg  45 mg Oral QHS Jomarie Longs, MD   45 mg at 08/15/15 2122  . multivitamin with  minerals tablet 1 tablet  1 tablet Oral Daily Rachael Fee, MD   1 tablet at 08/16/15 0751  . simvastatin (ZOCOR) tablet 20 mg  20 mg Oral q1800 Jomarie Longs, MD   20 mg at 08/16/15 1704  . thiamine (VITAMIN B-1) tablet 100 mg  100 mg Oral Daily Rachael Fee, MD   100 mg at 08/16/15 4540   Lab Results:  Results for orders placed or performed during the hospital encounter of 08/05/15 (from the past 48 hour(s))  Glucose, capillary     Status: Abnormal   Collection Time: 08/14/15  5:07 PM  Result Value Ref Range   Glucose-Capillary 271 (H) 65 - 99 mg/dL  Glucose, capillary     Status: None   Collection Time: 08/14/15  8:27  PM  Result Value Ref Range   Glucose-Capillary 81 65 - 99 mg/dL  Glucose, capillary     Status: Abnormal   Collection Time: 08/14/15 10:04 PM  Result Value Ref Range   Glucose-Capillary 207 (H) 65 - 99 mg/dL  Glucose, capillary     Status: Abnormal   Collection Time: 08/15/15  5:43 AM  Result Value Ref Range   Glucose-Capillary 263 (H) 65 - 99 mg/dL  Glucose, capillary     Status: Abnormal   Collection Time: 08/15/15 11:51 AM  Result Value Ref Range   Glucose-Capillary 211 (H) 65 - 99 mg/dL  Glucose, capillary     Status: Abnormal   Collection Time: 08/15/15  4:53 PM  Result Value Ref Range   Glucose-Capillary 367 (H) 65 - 99 mg/dL  Glucose, capillary     Status: Abnormal   Collection Time: 08/15/15  8:30 PM  Result Value Ref Range   Glucose-Capillary 150 (H) 65 - 99 mg/dL  Glucose, capillary     Status: Abnormal   Collection Time: 08/16/15  6:06 AM  Result Value Ref Range   Glucose-Capillary 226 (H) 65 - 99 mg/dL  Glucose, capillary     Status: Abnormal   Collection Time: 08/16/15 11:54 AM  Result Value Ref Range   Glucose-Capillary 265 (H) 65 - 99 mg/dL  Glucose, capillary     Status: Abnormal   Collection Time: 08/16/15  4:53 PM  Result Value Ref Range   Glucose-Capillary 282 (H) 65 - 99 mg/dL    Blood Alcohol level:  Lab Results  Component  Value Date   ETH 108* 08/01/2015   ETH 103* 04/17/2015   Physical Findings: AIMS: Facial and Oral Movements Muscles of Facial Expression: None, normal Lips and Perioral Area: None, normal Jaw: None, normal Tongue: None, normal,Extremity Movements Upper (arms, wrists, hands, fingers): None, normal Lower (legs, knees, ankles, toes): None, normal, Trunk Movements Neck, shoulders, hips: None, normal, Overall Severity Severity of abnormal movements (highest score from questions above): None, normal Incapacitation due to abnormal movements: None, normal Patient's awareness of abnormal movements (rate only patient's report): No Awareness, Dental Status Current problems with teeth and/or dentures?: No Does patient usually wear dentures?: No  CIWA:  CIWA-Ar Total: 2 COWS:  COWS Total Score: 5  Musculoskeletal: Strength & Muscle Tone: within normal limits Gait & Station: normal Patient leans: normal  Psychiatric Specialty Exam: Review of Systems  Psychiatric/Behavioral: Positive for depression, hallucinations and substance abuse. The patient is nervous/anxious and has insomnia (Improving).   All other systems reviewed and are negative.   Blood pressure 124/89, pulse 114, temperature 97.7 F (36.5 C), temperature source Oral, resp. rate 20, height 5\' 2"  (1.575 m), weight 89.359 kg (197 lb), last menstrual period 08/05/2015, SpO2 100 %.Body mass index is 36.02 kg/(m^2).  General Appearance: Casual and Fairly Groomed  Eye Contact::  Good  Speech:  Clear and Coherent  Volume:  Normal  Mood:  Anxious and Depressed,  improving  Affect:  Anxious  Thought Process:  Coherent and Goal Directed  Orientation:  Full (Time, Place, and Person)  Thought Content:  Rumination , improving  Suicidal Thoughts:  No  Homicidal Thoughts:  No  Memory:  Immediate;   Good Recent;   Good Remote;   Good  Judgement:  Poor  Insight:  Lacking  Psychomotor Activity:  "Anxiety improving"  Concentration:  Fair   Recall:  Good  Fund of Knowledge:Fair  Language: Fair  Akathisia:  No  Handed:  Right  AIMS (if indicated):     Assets:  Communication Skills Desire for Improvement  ADL's:  Intact  Cognition: WNL  Sleep:  6.0   Treatment Plan Summary: Kila Godina is a 40 y.o.AA Female Tomikia who is single , unemployed , who was admitted to [facility] adult unit from the medical floor of the [facility] with complaints of suicide attempt by overdose on insulin, Metformin & alcohol intoxication.Pt continues to be depressed and anxious ,and has negative voices . Will continue treatment.  Daily contact with patient to assess and evaluate symptoms and progress in treatment and Medication management: Continue supportive approach/coping skills Alcohol dependence; continue to work a relapse prevention plan Depression; Continue the Remeron to 45 mg po qhs. Will continue Lithium 300 mg po tid for mood sx, SI.Li level on 08/14/15- 0.16 mmols/l. Will get another Dierdre Searles level in 5 days - 08/18/15, will review lithium level when it becomes available. Hallucinations; Will continue Abilify  25 mg po daily. Diabetic management as per Hospitalist/Diabetic coordinator recommendations. Neuropathic Pain:Will continue Neurontin 900 mg po qid for pain sx. Muscle Spasms: Discontinue Flexeril 10 mg, start Robaxin 750 mg tid prn. Anxiety: continue Vistaril to 50 mg PO  Q 6 hr PRN due to increased anxiety level. Work with CBT/mindfulness Seasonal Allergic Rhinitis: Improved; Continue Claritin 10 mg PO  Daily. Reviewed labs- Hba1c- 11.5, lipid panel abnormal- will continue Zocor 20 mg po qpm , tsh wnl, ekg - qtc -wnl. CSW will continue working on disposition. Referral to [facility] services made. Patient to participate in therapeutic milieu. Continue current plan of care Sanjuana Kava, NP, PMHNP,  08/16/2015, 5:05 PM  Agree with NP Progress Note, as above  Nehemiah Massed, MD

## 2015-08-16 NOTE — Progress Notes (Signed)
D: Patient seen talking on phone at the shift change. Patient requesting to get her neurontin on time because of pain. Denies SI, AH/VH at this time. Bedtime CBG was 150 mg/dl. Patient requesting to have some snacks keep to her self to eat later at night.  A: Staff offered support and encouragement to patient as needed. Due medications given as ordered. Patient encouraged to continue with the treatment plans and verbalize concerns to staff.  R: Patient remains safe and cooperative on unit. Will continue to monitor patient for safety and stability.

## 2015-08-16 NOTE — [facility] Group Notes (Signed)
[facility] Group Notes: (Clinical Social Work)   08/16/2015      Type of Therapy:  Group Therapy   Participation Level:  Did Not Attend despite MHT prompting   Ambrose MantleMareida Grossman-Orr, LCSW 08/16/2015, 3:21 PM

## 2015-08-17 LAB — GLUCOSE, CAPILLARY: Glucose-Capillary: 160 mg/dL — ABNORMAL HIGH (ref 65–99)

## 2015-08-17 MED ORDER — MIRTAZAPINE 45 MG PO TABS: 45.0000 mg | ORAL_TABLET | Freq: Every day | ORAL | Status: DC

## 2015-08-17 MED ORDER — ARIPIPRAZOLE 5 MG PO TABS: 25.0000 mg | ORAL_TABLET | Freq: Every day | ORAL | Status: DC

## 2015-08-17 MED ORDER — HYDROXYZINE HCL 50 MG PO TABS: 50.0000 mg | ORAL_TABLET | Freq: Four times a day (QID) | ORAL | Status: DC | PRN

## 2015-08-17 MED ORDER — GABAPENTIN 300 MG PO CAPS: 900.0000 mg | ORAL_CAPSULE | Freq: Three times a day (TID) | ORAL | Status: DC

## 2015-08-17 MED ORDER — METFORMIN HCL 1000 MG PO TABS: 1000.0000 mg | ORAL_TABLET | Freq: Two times a day (BID) | ORAL | Status: DC

## 2015-08-17 MED ORDER — LITHIUM CARBONATE 300 MG PO CAPS: 300.0000 mg | ORAL_CAPSULE | Freq: Three times a day (TID) | ORAL | Status: DC

## 2015-08-17 MED ORDER — INSULIN ASPART 100 UNIT/ML ~~LOC~~ SOLN: 15.0000 [IU] | Freq: Three times a day (TID) | SUBCUTANEOUS | Status: DC

## 2015-08-17 MED ORDER — INSULIN GLARGINE 100 UNIT/ML ~~LOC~~ SOLN: 60.0000 [IU] | Freq: Two times a day (BID) | SUBCUTANEOUS | Status: DC

## 2015-08-17 MED ORDER — FLUTICASONE PROPIONATE 50 MCG/ACT NA SUSP: 1.0000 | Freq: Every day | NASAL | Status: DC | PRN

## 2015-08-17 MED ORDER — SIMVASTATIN 20 MG PO TABS: 20.0000 mg | ORAL_TABLET | Freq: Every day | ORAL | Status: DC

## 2015-08-17 NOTE — Progress Notes (Signed)
Patient verbalizes readiness for discharge. Follow up plan explained, Rx's given along with sample meds. Patient verbalizes understanding. Upon return of belongings, patient stating she had medications she presented with. Per belongings sheet patient on admit which she signed, meds were not listed. Phone calls made to [employer] security, pharmacy and [employer]. All denied record of patient having meds. Encouraged [facility] as patient does not have insulin. Patient confirmed understanding. She denies SI/HI and assures this Clinical research associatewriter she will seek assistance should that status change. Patient discharged ambulatory and in stable condition with 2 bus passes and directions to bus stop. Lawrence MarseillesFriedman, Montague Corella Eakes

## 2015-08-17 NOTE — Progress Notes (Signed)
Inpatient Diabetes Program Recommendations  AACE/ADA: New Consensus Statement on Inpatient Glycemic Control (2015)  Target Ranges:  Prepandial:   less than 140 mg/dL      Peak postprandial:   less than 180 mg/dL (1-2 hours)      Critically ill patients:  140 - 180 mg/dL   Review of Glycemic Control  Results for Lenard GallowayXXXCARDENAS, Jolly DENISE (MRN [id number]) as of 08/17/2015 11:50  Ref. Range 08/16/2015 06:06 08/16/2015 11:54 08/16/2015 16:53 08/16/2015 20:33 08/17/2015 06:34  Glucose-Capillary Latest Ref Range: 65-99 mg/dL 401226 (H) 027265 (H) 253282 (H) 184 (H) 160 (H)   Blood sugars much improved with Novolog 15 units tidwc for meal coverage insulin. Continues on Lantus 60 units bid and Novolog moderate tidwc and hs.  Will continue to follow.  Thank you. Ailene Ardshonda Roena Sassaman, RD, LDN, CDE Inpatient Diabetes Coordinator [phone]

## 2015-08-17 NOTE — [facility] Suicide Risk Assessment (Signed)
[facility]   Principal Problem: Depression, major, recurrent, severe with psychosis [facility](HCC) Discharge Diagnoses:  Patient Active Problem List   Diagnosis Date Noted  . Diabetes mellitus without complication (HCC) [E11.9]   . Hyperlipidemia [E78.5] 08/10/2015  . Alcohol use disorder, moderate, dependence (HCC) [F10.20] 08/10/2015  . Neuropathic pain [M79.2] 08/08/2015  . Seasonal allergic rhinitis [J30.2] 08/08/2015  . 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 Uncontrolled diabetes mellitus with neurologic complication, with long-term current use of insulin (HCC) [E11.49, Z79.4, E11.65]   . Uncontrolled diabetes mellitus with diabetic neuropathy, with long-term current use of insulin (HCC) [E11.40, Z79.4, E11.65] 04/21/2015  . Generalized anxiety disorder [F41.1] 02/13/2013  . Chronic posttraumatic stress disorder [F43.12] 02/13/2013  . Substance induced mood disorder (HCC) [F19.94] 02/08/2013  . IDDM (insulin dependent diabetes mellitus) (HCC) [E11.9, Z79.4] 02/08/2013    Total Time spent with patient: 30 minutes  Musculoskeletal: Strength & Muscle Tone: within normal limits Gait & Station: normal Patient leans: N/A  Psychiatric Specialty Exam: Review of Systems  Psychiatric/Behavioral: Negative for depression, suicidal ideas and hallucinations. The patient is not nervous/anxious.   All other systems reviewed and are negative.   Blood pressure 134/91, pulse 111, temperature 98.1 F (36.7 C), temperature source Oral, resp. rate 18, height 5\' 2"  (1.575 m), weight 89.359 kg (197 lb), last menstrual period 08/05/2015, SpO2 100 %.Body mass index is 36.02 kg/(m^2).  General Appearance: Casual  Eye Contact::  Fair  Speech:  Normal Rate409  Volume:  Normal  Mood:  Euthymic  Affect:  Congruent  Thought Process:  Goal Directed  Orientation:  Full (Time, Place, and Person)  Thought Content:  WDL  Suicidal Thoughts:  No   Homicidal Thoughts:  No  Memory:  Immediate;   Fair Recent;   Fair Remote;   Fair  Judgement:  Fair  Insight:  Fair  Psychomotor Activity:  Normal  Concentration:  Fair  Recall:  [employer]  Fund of Knowledge:Fair  Language: Fair  Akathisia:  No  Handed:  Right  AIMS (if indicated):   0  Assets:  Communication Skills Desire for Improvement  Sleep:  Number of Hours: 6  Cognition: WNL  ADL's:  Intact   Mental Status Per Nursing Assessment::   On Admission:  NA  Demographic Factors:  Unemployed  Loss Factors: Financial problems/change in socioeconomic status  Historical Factors: Impulsivity  Risk Reduction Factors:   Positive coping skills or problem solving skills  Continued Clinical Symptoms:  Previous Psychiatric Diagnoses and Treatments Medical Diagnoses and Treatments/Surgeries  Cognitive Features That Contribute To Risk:  Polarized thinking    Suicide Risk:  Minimal: No identifiable suicidal ideation.  Patients presenting with no risk factors but with morbid ruminations; may be classified as minimal risk based on the severity of the depressive symptoms    Plan Of Care/Follow-up recommendations:  Activity:  no restrictions Diet:  carb modified Tests:  as needed Other:  follow up with aftercare as scheduled  Trenese Haft, MD 08/17/2015, 9:51 AM

## 2015-08-17 NOTE — Tx Team (Signed)
Interdisciplinary Treatment Plan Update (Adult) Date: 08/17/2015    Time Reviewed: 9:30 AM  Progress in Treatment: Attending groups: Yes Participating in groups: Minimally Taking medication as prescribed: Yes Tolerating medication: Yes Family/Significant other contact made: No, Pt declines Patient understands diagnosis: Yes Discussing patient identified problems/goals with staff: Yes Medical problems stabilized or resolved: Yes Denies suicidal/homicidal ideation: Yes Issues/concerns per patient self-inventory: Yes Other:  New problem(s) identified: N/A  Discharge Plan or Barriers: CSW continuing to assess, patient new to milieu.   08/11/15: Discharge plan still not established. Pt has no income and has declined/is not eligible for substance abuse facilities. She has no income and her diabetes is unmanaged.   08/14/15: Pt plans to relocate to [place] to live with family.  Reason for Continuation of Hospitalization:    Comments: N/A  Estimated length of stay: D/C today    Patient is a 40 year old female with a history of Major Depressive Disorder and Bipolar Disorder. Pt presented to the hospital with depression, anxiety, alcohol intoxication, and suicidal ideations. Pt reports primary trigger(s) for admission were a recent break up and family death. Patient will benefit from crisis stabilization, medication evaluation, group therapy and psycho education in addition to case management for discharge planning. At discharge, it is recommended that Pt remain compliant with established discharge plan and continued treatment.  Continue the Librium detox protocol/work a relapse prevention plan Continue the Remeron 30 mg po qhs , consider increasing to 45. No increase at this time. Add Lithium 150 mg po bid for mood sx, SI.Li level in 5 days. Continue Abilify 20 mg po daily. Continue Vistaril 25 mg PO Q 6 hr PRN   Review of initial/current patient goals per problem list:  1.  Goal(s): Patient will participate in aftercare plan   Met: Yes   Target date: D/C   As evidenced by: Patient will participate within aftercare plan AEB aftercare provider and housing plan at discharge being identified.  2/23: Goal not met: CSW assessing for appropriate referrals for pt and will have follow up secured prior to d/c.  2/28: Discharge plan undetermined. CSW assessing for appropriate referrals.   08/15/15: Pt plans to relocate to [place] to live with family and follow-up with outpatient resources.    2. Goal (s): Patient will exhibit decreased depressive symptoms and suicidal ideations.   Met: Yes   Target date: 3-5 days post admission date   As evidenced by: Patient will utilize self rating of depression at 3 or below and demonstrate decreased signs of depression or be deemed stable for discharge by MD.  2/23: Goal not met: Pt presents with flat affect and depressed mood.  Pt admitted with depression rating of 10.  Pt to show decreased sign of depression and a rating of 3 or less before d/c.    2/28: Pt rates depression at 9/10; passive SI  08/14/15: MD feels that [name] symptoms have decreased to the point that they can be managed in an outpatient setting.  08/17/15:  Pt denies symptoms today   3. Goal(s): Patient will demonstrate decreased signs and symptoms of anxiety.   Met: Yes   Target date: 3-5 days post admission date   As evidenced by: Patient will utilize self rating of anxiety at 3 or below and demonstrated decreased signs of anxiety, or be deemed stable for discharge by MD  2/23: Goal not met: Pt presents with anxious mood and affect.  Pt admitted with anxiety rating of 10.  Pt to show  decreased sign of anxiety and a rating of 3 or less before d/c  2/28: Pt rates anxiety at 10/10  3/3: Pt continues to rate anxiety at high levels 08/17/15:  Denies anxiety today   4. Goal(s): Patient will demonstrate decreased signs of withdrawal due to substance  abuse   Met: Yes   Target date: 3-5 days post admission date   As evidenced by: Patient will produce a CIWA/COWS score of 0, have stable vitals signs, and no symptoms of withdrawal   2/23: Goal met. No withdrawal symptoms reported at this time per medical chart.     Attendees: Patient:    Family:    Physician: Dr. Shea Evans 08/06/2015 9:30 AM  Nursing:; Hedy Jacob., RN 08/06/2015 9:30 AM  Clinical Social Worker: Ripley Fraise 08/06/2015 9:30 AM  Other:  08/06/2015 9:30 AM  Other:  08/06/2015 9:30 AM  Other:  08/06/2015 9:30 AM  Other:  08/06/2015 9:30 AM  Other:           Scribe for Treatment Team:

## 2015-08-17 NOTE — Discharge Summary (Signed)
Physician Discharge Summary Note  Patient:  Carolyn Ayala is an 40 y.o., female MRN:  [id number] DOB:  [birth year] Patient phone:  [phone] (home)  Patient address:   [address] [place] [address],  Total Time spent with patient: 30 minutes  Date of Admission:  08/05/2015 Date of Discharge: 08/17/2015  Reason for Admission:  suicidal ideation  Principal Problem: Depression, major, recurrent, severe with psychosis [facility]) Discharge Diagnoses: Patient Active Problem List   Diagnosis Date Noted  . Diabetes mellitus without complication (HCC) [E11.9]   . Hyperlipidemia [E78.5] 08/10/2015  . Alcohol use disorder, moderate, dependence (HCC) [F10.20] 08/10/2015  . Neuropathic pain [M79.2] 08/08/2015  . Seasonal allergic rhinitis [J30.2] 08/08/2015  . 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 Uncontrolled diabetes mellitus with neurologic complication, with long-term current use of insulin (HCC) [E11.49, Z79.4, E11.65]   . Uncontrolled diabetes mellitus with diabetic neuropathy, with long-term current use of insulin (HCC) [E11.40, Z79.4, E11.65] 04/21/2015  . Generalized anxiety disorder [F41.1] 02/13/2013  . Chronic posttraumatic stress disorder [F43.12] 02/13/2013  . Substance induced mood disorder (HCC) [F19.94] 02/08/2013  . IDDM (insulin dependent diabetes mellitus) (HCC) [E11.9, Z79.4] 02/08/2013    Past Psychiatric History:  See above noted  Past Medical History:  Past Medical History  Diagnosis Date  . Mental disorder   . Depression   . Diabetes mellitus without complication (HCC)   . Hypertension     Past Surgical History  Procedure Laterality Date  . Cesarean section     Family History:  Family History  Problem Relation Age of Onset  . Depression Mother    Family Psychiatric  History:  See above noted Social History:  History  Alcohol Use  . Yes    Comment: daily     History  Drug Use No     Social History   Social History  . Marital Status: Single    Spouse Name: N/A  . Number of Children: N/A  . Years of Education: N/A   Social History Main Topics  . Smoking status: Never Smoker   . Smokeless tobacco: Current User  . Alcohol Use: Yes     Comment: daily  . Drug Use: No  . Sexual Activity: No   Other Topics Concern  . None   Social History Narrative   ** Merged History Encounter **        Hospital Course:  Carolyn Ayala, a 40 year old female was dmitted to [facility] adult unit from the medical floor of the [facility] with complaints of suicide attempt by overdose on insulin, Metformin & alcohol intoxication. She was recently discharged from this hospital on 07-09-15 after receiving alcohol detoxification & mood stabilization treatments.  Carolyn Ayala was admitted for Depression, major, recurrent, severe with psychosis (HCC) and crisis management.  She was treated with the following medications, Remeron to 45 mg nightly,  Neurontin 900 mg4x/day for neuropathic pain symptoms,  Vistaril to 50 mg every 6 hours as needed due to increased anxiety level, Lithium 300 mg three times a day for mood symptom and suicidal ideations and Abilify 25 mg daily for hallucinations.  Li level on 08/14/15- 0.16 mmols/l.  Advised patient to obtain follow up Lithium level in the outpatient clinic.  Hba1c- 11.5, lipid panel abnormal- will continue Zocor 20 mg daily.  Carolyn Ayala was discharged with current medication and was instructed on how to take medications as prescribed; (details listed below under Medication  List).  Medical problems were identified and treated as needed.  Home medications were restarted as appropriate.  Improvement was monitored by observation and [facility] daily report of symptom reduction.  Emotional and mental status was monitored by daily self-inventory reports completed by Carolyn Ayala and clinical  staff.  Diabetic management as per Hospitalist/Diabetic coordinator recommendations.       Carolyn Ayala was evaluated by the treatment team for stability and plans for continued recovery upon discharge.  Carolyn Ayala motivation was an integral factor for scheduling further treatment.  Employment, transportation, bed availability, health status, family support, and any pending legal issues were also considered during her hospital stay.  She was offered further treatment options upon discharge including but not limited to Residential, Intensive Outpatient, and Outpatient treatment.  Carolyn Ayala will follow up with the services as listed below under Follow Up Information.     Upon completion of this admission the Carolyn Ayala was both mentally and medically stable for discharge denying suicidal/homicidal ideation, auditory/visual/tactile hallucinations, delusional thoughts and paranoia.     Physical Findings: AIMS: Facial and Oral Movements Muscles of Facial Expression: None, normal Lips and Perioral Area: None, normal Jaw: None, normal Tongue: None, normal,Extremity Movements Upper (arms, wrists, hands, fingers): None, normal Lower (legs, knees, ankles, toes): None, normal, Trunk Movements Neck, shoulders, hips: None, normal, Overall Severity Severity of abnormal movements (highest score from questions above): None, normal Incapacitation due to abnormal movements: None, normal Patient's awareness of abnormal movements (rate only patient's report): No Awareness, Dental Status Current problems with teeth and/or dentures?: No Does patient usually wear dentures?: No  CIWA:  CIWA-Ar Total: 2 COWS:  COWS Total Score: 5  Musculoskeletal: Strength & Muscle Tone: within normal limits Gait & Station: normal Patient leans: N/A  Psychiatric Specialty Exam:  See MD SRA Review of Systems  Psychiatric/Behavioral: Negative for depression and  suicidal ideas. The patient is not nervous/anxious.   All other systems reviewed and are negative.   Blood pressure 134/91, pulse 111, temperature 98.1 F (36.7 C), temperature source Oral, resp. rate 18, height 5\' 2"  (1.575 m), weight 89.359 kg (197 lb), last menstrual period 08/05/2015, SpO2 100 %.Body mass index is 36.02 kg/(m^2).  Have you used any form of tobacco in the last 30 days? (Cigarettes, Smokeless Tobacco, Cigars, and/or Pipes): No  Has this patient used any form of tobacco in the last 30 days? (Cigarettes, Smokeless Tobacco, Cigars, and/or Pipes) Yes, N/A  Blood Alcohol level:  Lab Results  Component Value Date   ETH 108* 08/01/2015   ETH 103* 04/17/2015    Metabolic Disorder Labs:  Lab Results  Component Value Date   HGBA1C 11.4* 08/03/2015   MPG 280 08/03/2015   MPG 283 08/01/2015   Lab Results  Component Value Date   PROLACTIN 10.7 04/28/2015   Lab Results  Component Value Date   CHOL 248* 08/07/2015   TRIG 195* 08/07/2015   HDL 83 08/07/2015   CHOLHDL 3.0 08/07/2015   VLDL 39 08/07/2015   LDLCALC 126* 08/07/2015   LDLCALC 132* 04/29/2015    See Psychiatric Specialty Exam and Suicide Risk Assessment completed by Attending Physician prior to discharge.  Discharge destination:  Home  Is patient on multiple antipsychotic therapies at discharge:  No   Has Patient had three or more failed trials of antipsychotic monotherapy by history:  No  Recommended Plan for Multiple Antipsychotic Therapies: NA     Medication List    STOP  taking these medications        capsaicin 0.025 % cream  Commonly known as:  ZOSTRIX     diclofenac sodium 1 % Gel  Commonly known as:  VOLTAREN     eucerin cream     FLUoxetine 40 MG capsule  Commonly known as:  PROZAC     LORazepam 1 MG tablet  Commonly known as:  ATIVAN     Melatonin 5 MG Tabs     multivitamin with minerals tablet     VITAMIN B-1 PO      TAKE these medications      Indication    ARIPiprazole 5 MG tablet  Commonly known as:  ABILIFY  Take 5 tablets (25 mg total) by mouth daily.   Indication:  psychosis     fluticasone 50 MCG/ACT nasal spray  Commonly known as:  FLONASE  Place 1 spray into both nostrils daily as needed for allergies or rhinitis.   Indication:  Nonallergic Rhinitis     gabapentin 300 MG capsule  Commonly known as:  NEURONTIN  Take 3 capsules (900 mg total) by mouth 4 (four) times daily - after meals and at bedtime.   Indication:  Aggressive Behavior, Agitation     hydrOXYzine 50 MG tablet  Commonly known as:  ATARAX/VISTARIL  Take 1 tablet (50 mg total) by mouth every 6 (six) hours as needed for anxiety.   Indication:  Anxiety     insulin aspart 100 UNIT/ML injection  Commonly known as:  novoLOG  Inject 15 Units into the skin 3 (three) times daily with meals.   Indication:  Type 2 Diabetes     insulin glargine 100 UNIT/ML injection  Commonly known as:  LANTUS  Inject 0.6 mLs (60 Units total) into the skin 2 (two) times daily.   Indication:  Type 2 Diabetes     lithium carbonate 300 MG capsule  Take 1 capsule (300 mg total) by mouth 3 (three) times daily with meals.   Indication:  mood stabilization     metFORMIN 1000 MG tablet  Commonly known as:  GLUCOPHAGE  Take 1 tablet (1,000 mg total) by mouth 2 (two) times daily with a meal.   Indication:  Type 2 Diabetes     mirtazapine 45 MG tablet  Commonly known as:  REMERON  Take 1 tablet (45 mg total) by mouth at bedtime.   Indication:  Trouble Sleeping, Major Depressive Disorder     simvastatin 20 MG tablet  Commonly known as:  ZOCOR  Take 1 tablet (20 mg total) by mouth daily at 6 PM.   Indication:  high cholesterol           Follow-up Information    Schedule an appointment as soon as possible for a visit with [facility].   Why:  please go to the clinic for your diabetes care   Contact information:   [address] [place]  [place] [address] [phone]      Follow up with [facility].   Why:  You are to go here the day of discharge and check in with the PATH program. Once you arrive in [place], you will need to find a mental health provider.  Please sign a release for [facility] so they can get your records.   Contact information:   [address] [facility]  [phone]      Follow-up recommendations:  Activity:  as tol Diet:  as  tol  Comments:  1.  Take all your medications as prescribed.              2.  Report any adverse side effects to outpatient provider.                       3.  Patient instructed to not use alcohol or illegal drugs while on prescription medicines.            4.  In the event of worsening symptoms, instructed patient to call 911, the crisis hotline or go to nearest emergency room for evaluation of symptoms.  Signed: Lindwood Qua, NP [facility] 08/17/2015, 11:19 AM

## 2015-08-17 NOTE — Progress Notes (Signed)
  [facility] Discharge Plan :  Will you be returning to the same living situation after discharge:  No. At discharge, do you have transportation home?: Yes,  [facility] Do you have the ability to pay for your medications: Yes,  MCD  Release of information consent forms completed and in the chart;  Patient's signature needed at discharge.  Patient to Follow up at: Follow-up Information    Schedule an appointment as soon as possible for a visit with [facility].   Why:  please go to the clinic for your diabetes care   Contact information:   [address] [place] [id number] [phone]      [facility].   Why:  You are to go here the day of discharge and check in with the PATH program. Once you arrive in [place], you will need to find a mental health provider.  Please sign a release for [facility] so they can get your records.   Contact information:   [address] [place]  [place]  [phone]      Next level of care provider has access to [facility] Link:no  Safety Planning and Suicide Prevention discussed: Yes,  yes  Have you used any form of tobacco in the last 30 days? (Cigarettes, Smokeless Tobacco, Cigars, and/or Pipes): No  Has patient been referred to the Quitline?: N/A patient is not a smoker  Patient has been referred for addiction treatment: Yes  Ida Rogueorth, Miran Kautzman B 08/17/2015, 3:34 PM

## 2015-08-17 NOTE — Progress Notes (Signed)
DAR Note: Carolyn Ayala has been up and visible on the unit.  Attending groups and interacting with peers.  She adamantly denies SI/HI despite her self inventory stating she has thoughts of suicide on and off.  She rates her depression, hopelessness and anxiety 9/10.  Her goal for today is "to see her social worker" and she will accomplish this goal by "seeing her."  Encouraged continued participation in group and unit activities.  She denies any pain or discomfort and appears to be in no physical distress.  Q 15 minute checks maintained for safety.  We will continue to monitor the progress towards her goals.

## 2015-08-17 NOTE — Plan of Care (Signed)
Problem: Diagnosis: Increased Risk For Suicide Attempt Goal: STG-Patient Will Comply With Medication Regime Outcome: Progressing Pt compliant with medication regime     

## 2015-08-18 ENCOUNTER — Emergency Department (HOSPITAL_COMMUNITY)
Admission: EM | Admit: 2015-08-18 | Discharge: 2015-08-22 | Payer: MEDICAID | Attending: Emergency Medicine | Admitting: Emergency Medicine

## 2015-08-18 ENCOUNTER — Encounter (HOSPITAL_COMMUNITY): Payer: Self-pay

## 2015-08-18 DIAGNOSIS — G478 Other sleep disorders: Secondary | ICD-10-CM | POA: Insufficient documentation

## 2015-08-18 DIAGNOSIS — Z3202 Encounter for pregnancy test, result negative: Secondary | ICD-10-CM | POA: Insufficient documentation

## 2015-08-18 DIAGNOSIS — F419 Anxiety disorder, unspecified: Secondary | ICD-10-CM | POA: Insufficient documentation

## 2015-08-18 DIAGNOSIS — R45851 Suicidal ideations: Secondary | ICD-10-CM

## 2015-08-18 DIAGNOSIS — F1310 Sedative, hypnotic or anxiolytic abuse, uncomplicated: Secondary | ICD-10-CM | POA: Insufficient documentation

## 2015-08-18 DIAGNOSIS — Z794 Long term (current) use of insulin: Secondary | ICD-10-CM | POA: Insufficient documentation

## 2015-08-18 DIAGNOSIS — Z79899 Other long term (current) drug therapy: Secondary | ICD-10-CM | POA: Insufficient documentation

## 2015-08-18 DIAGNOSIS — E119 Type 2 diabetes mellitus without complications: Secondary | ICD-10-CM | POA: Insufficient documentation

## 2015-08-18 DIAGNOSIS — R41840 Attention and concentration deficit: Secondary | ICD-10-CM | POA: Insufficient documentation

## 2015-08-18 DIAGNOSIS — Z7984 Long term (current) use of oral hypoglycemic drugs: Secondary | ICD-10-CM | POA: Insufficient documentation

## 2015-08-18 DIAGNOSIS — R4589 Other symptoms and signs involving emotional state: Secondary | ICD-10-CM

## 2015-08-18 DIAGNOSIS — F333 Major depressive disorder, recurrent, severe with psychotic symptoms: Secondary | ICD-10-CM | POA: Diagnosis present

## 2015-08-18 DIAGNOSIS — F1020 Alcohol dependence, uncomplicated: Secondary | ICD-10-CM | POA: Diagnosis present

## 2015-08-18 DIAGNOSIS — I10 Essential (primary) hypertension: Secondary | ICD-10-CM | POA: Insufficient documentation

## 2015-08-18 DIAGNOSIS — R4689 Other symptoms and signs involving appearance and behavior: Secondary | ICD-10-CM

## 2015-08-18 DIAGNOSIS — F329 Major depressive disorder, single episode, unspecified: Secondary | ICD-10-CM | POA: Insufficient documentation

## 2015-08-18 LAB — CBC
HCT: 38.1 % (ref 36.0–46.0)
Hemoglobin: 11.9 g/dL — ABNORMAL LOW (ref 12.0–15.0)
MCH: 24.5 pg — ABNORMAL LOW (ref 26.0–34.0)
MCHC: 31.2 g/dL (ref 30.0–36.0)
MCV: 78.6 fL (ref 78.0–100.0)
Platelets: 481 10*3/uL — ABNORMAL HIGH (ref 150–400)
RBC: 4.85 MIL/uL (ref 3.87–5.11)
RDW: 15.9 % — ABNORMAL HIGH (ref 11.5–15.5)
WBC: 8 10*3/uL (ref 4.0–10.5)

## 2015-08-18 LAB — COMPREHENSIVE METABOLIC PANEL
ALT: 28 U/L (ref 14–54)
AST: 23 U/L (ref 15–41)
Albumin: 4.5 g/dL (ref 3.5–5.0)
Alkaline Phosphatase: 93 U/L (ref 38–126)
Anion gap: 12 (ref 5–15)
BUN: 9 mg/dL (ref 6–20)
CO2: 24 mmol/L (ref 22–32)
Calcium: 10.5 mg/dL — ABNORMAL HIGH (ref 8.9–10.3)
Chloride: 105 mmol/L (ref 101–111)
Creatinine, Ser: 0.48 mg/dL (ref 0.44–1.00)
GFR calc Af Amer: >60 mL/min (ref >60)
GFR calc non Af Amer: >60 mL/min (ref >60)
Glucose, Bld: 165 mg/dL — ABNORMAL HIGH (ref 65–99)
Potassium: 4.1 mmol/L (ref 3.5–5.1)
Sodium: 141 mmol/L (ref 135–145)
Total Bilirubin: 0.4 mg/dL (ref 0.3–1.2)
Total Protein: 8.3 g/dL — ABNORMAL HIGH (ref 6.5–8.1)

## 2015-08-18 LAB — CBG MONITORING, ED
Glucose-Capillary: 147 mg/dL — ABNORMAL HIGH (ref 65–99)
Glucose-Capillary: 164 mg/dL — ABNORMAL HIGH (ref 65–99)
Glucose-Capillary: 127 mg/dL — ABNORMAL HIGH (ref 65–99)
Glucose-Capillary: 129 mg/dL — ABNORMAL HIGH (ref 65–99)
Glucose-Capillary: 67 mg/dL (ref 65–99)
Glucose-Capillary: 221 mg/dL — ABNORMAL HIGH (ref 65–99)

## 2015-08-18 LAB — ACETAMINOPHEN LEVEL: Acetaminophen (Tylenol), Serum: <10 ug/mL — ABNORMAL LOW (ref 10–30)

## 2015-08-18 LAB — RAPID URINE DRUG SCREEN, HOSP PERFORMED
Amphetamines: NOT DETECTED
Barbiturates: NOT DETECTED
Benzodiazepines: POSITIVE — AB
Cocaine: NOT DETECTED
Opiates: NOT DETECTED
Tetrahydrocannabinol: NOT DETECTED

## 2015-08-18 LAB — ETHANOL: Alcohol, Ethyl (B): 80 mg/dL — ABNORMAL HIGH (ref <5)

## 2015-08-18 LAB — SALICYLATE LEVEL: Salicylate Lvl: <4 mg/dL (ref 2.8–30.0)

## 2015-08-18 MED ORDER — LITHIUM CARBONATE 300 MG PO CAPS
300.0000 mg | ORAL_CAPSULE | Freq: Three times a day (TID) | ORAL | Status: DC
  Administered 2015-08-18: 300 mg via ORAL
  Filled 2015-08-18: qty 1

## 2015-08-18 MED ORDER — GABAPENTIN 300 MG PO CAPS
900.0000 mg | ORAL_CAPSULE | Freq: Three times a day (TID) | ORAL | Status: DC
  Administered 2015-08-18 – 2015-08-22 (×17): 900 mg via ORAL
  Filled 2015-08-18 (×19): qty 3

## 2015-08-18 MED ORDER — ARIPIPRAZOLE 15 MG PO TABS
25.0000 mg | ORAL_TABLET | Freq: Every day | ORAL | Status: DC
  Administered 2015-08-19: 25 mg via ORAL
  Filled 2015-08-18 (×3): qty 1

## 2015-08-18 MED ORDER — FLUTICASONE PROPIONATE 50 MCG/ACT NA SUSP
1.0000 | Freq: Every day | NASAL | Status: DC | PRN
  Filled 2015-08-18: qty 16

## 2015-08-18 MED ORDER — SIMVASTATIN 20 MG PO TABS
20.0000 mg | ORAL_TABLET | Freq: Every day | ORAL | Status: DC
  Administered 2015-08-18 – 2015-08-21 (×4): 20 mg via ORAL
  Filled 2015-08-18 (×5): qty 1

## 2015-08-18 MED ORDER — IBUPROFEN 200 MG PO TABS
600.0000 mg | ORAL_TABLET | Freq: Once | ORAL | Status: AC
  Administered 2015-08-18: 600 mg via ORAL
  Filled 2015-08-18: qty 3

## 2015-08-18 MED ORDER — INSULIN ASPART 100 UNIT/ML ~~LOC~~ SOLN
0.0000 [IU] | Freq: Three times a day (TID) | SUBCUTANEOUS | Status: DC
  Administered 2015-08-18: 2 [IU] via SUBCUTANEOUS
  Administered 2015-08-19: 3 [IU] via SUBCUTANEOUS
  Administered 2015-08-19: 8 [IU] via SUBCUTANEOUS
  Administered 2015-08-19 – 2015-08-20 (×3): 5 [IU] via SUBCUTANEOUS
  Administered 2015-08-20: 8 [IU] via SUBCUTANEOUS
  Administered 2015-08-21: 3 [IU] via SUBCUTANEOUS
  Administered 2015-08-21: 5 [IU] via SUBCUTANEOUS
  Administered 2015-08-22: 3 [IU] via SUBCUTANEOUS
  Filled 2015-08-18 (×9): qty 1

## 2015-08-18 MED ORDER — INSULIN GLARGINE 100 UNIT/ML ~~LOC~~ SOLN
60.0000 [IU] | Freq: Two times a day (BID) | SUBCUTANEOUS | Status: DC
  Administered 2015-08-18 – 2015-08-22 (×8): 60 [IU] via SUBCUTANEOUS
  Filled 2015-08-18 (×9): qty 0.6

## 2015-08-18 MED ORDER — HYDROXYZINE HCL 25 MG PO TABS
50.0000 mg | ORAL_TABLET | Freq: Four times a day (QID) | ORAL | Status: DC | PRN
  Administered 2015-08-18 – 2015-08-22 (×13): 50 mg via ORAL
  Filled 2015-08-18 (×13): qty 2

## 2015-08-18 MED ORDER — METFORMIN HCL 500 MG PO TABS
1000.0000 mg | ORAL_TABLET | Freq: Two times a day (BID) | ORAL | Status: DC
  Administered 2015-08-18 – 2015-08-22 (×9): 1000 mg via ORAL
  Filled 2015-08-18 (×11): qty 2

## 2015-08-18 MED ORDER — MIRTAZAPINE 30 MG PO TABS
45.0000 mg | ORAL_TABLET | Freq: Every day | ORAL | Status: DC
  Administered 2015-08-18 – 2015-08-21 (×4): 45 mg via ORAL
  Filled 2015-08-18 (×4): qty 2

## 2015-08-18 MED ORDER — INSULIN ASPART 100 UNIT/ML ~~LOC~~ SOLN
15.0000 [IU] | Freq: Three times a day (TID) | SUBCUTANEOUS | Status: DC
  Administered 2015-08-18: 6 [IU] via SUBCUTANEOUS
  Filled 2015-08-18: qty 1

## 2015-08-18 MED ORDER — LITHIUM CARBONATE ER 300 MG PO TBCR
300.0000 mg | EXTENDED_RELEASE_TABLET | Freq: Two times a day (BID) | ORAL | Status: DC
  Administered 2015-08-18 – 2015-08-22 (×9): 300 mg via ORAL
  Filled 2015-08-18 (×10): qty 1
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
## 2015-08-18 NOTE — [facility] Counselor (Signed)
08-18-15 [facility] referral faxed Majesty Oehlert K. Sherlon HandingHarris, LCAS-A, LPC-A, [facility]  Counselor 08/18/2015 7:30 PM

## 2015-08-18 NOTE — ED Provider Notes (Signed)
CSN: [id number]     Arrival date & time 08/18/15  0421 History   First MD Initiated Contact with Patient 08/18/15 [phone]     Chief Complaint  Patient presents with  . Suicidal     (Consider location/radiation/quality/duration/timing/severity/associated sxs/prior Treatment) HPI  Patient is a poor historian, but very rambling history, but it seems as though she acknowledges worsening depression, states that she wants to die. She states that she is having a very difficult time coping with the recent death of her mother, and end of a relationship. She acknowledges need for assistance. She was recently in our behavioral health facility. She denies new medication changes, activity changes. She does acknowledge minor pain in her left mouth, acknowledges dental issues in that area. She denies fever, vomiting.     Past Medical History  Diagnosis Date  . Mental disorder   . Depression   . Diabetes mellitus without complication (HCC)   . Hypertension    Past Surgical History  Procedure Laterality Date  . Cesarean section     Family History  Problem Relation Age of Onset  . Depression Mother    Social History  Substance Use Topics  . Smoking status: Never Smoker   . Smokeless tobacco: Current User  . Alcohol Use: Yes     Comment: daily   OB History    No data available     Review of Systems  Constitutional:       Per HPI, otherwise negative  HENT:       Per HPI, otherwise negative  Respiratory:       Per HPI, otherwise negative  Cardiovascular:       Per HPI, otherwise negative  Gastrointestinal: Negative for vomiting.  Endocrine:       Negative aside from HPI  Genitourinary:       Neg aside from HPI   Musculoskeletal:       Per HPI, otherwise negative  Skin: Negative.   Neurological: Negative for syncope.  Psychiatric/Behavioral: Positive for suicidal ideas, sleep disturbance, dysphoric mood and decreased concentration. The patient is nervous/anxious and is  hyperactive.       Allergies  Review of patient's allergies indicates no known allergies.  Home Medications   Prior to Admission medications   Medication Sig Start Date End Date Taking? Authorizing Provider  ARIPiprazole (ABILIFY) 5 MG tablet Take 5 tablets (25 mg total) by mouth daily. 08/17/15   Adonis BrookSheila Agustin, NP  fluticasone [facility](FLONASE) 50 MCG/ACT nasal spray Place 1 spray into both nostrils daily as needed for allergies or rhinitis. 08/17/15   Adonis BrookSheila Agustin, NP  gabapentin (NEURONTIN) 300 MG capsule Take 3 capsules (900 mg total) by mouth 4 (four) times daily - after meals and at bedtime. 08/17/15   Adonis BrookSheila Agustin, NP  hydrOXYzine (ATARAX/VISTARIL) 50 MG tablet Take 1 tablet (50 mg total) by mouth every 6 (six) hours as needed for anxiety. 08/17/15   Adonis BrookSheila Agustin, NP  insulin aspart (NOVOLOG) 100 UNIT/ML injection Inject 15 Units into the skin 3 (three) times daily with meals. 08/17/15   Adonis BrookSheila Agustin, NP  insulin glargine (LANTUS) 100 UNIT/ML injection Inject 0.6 mLs (60 Units total) into the skin 2 (two) times daily. 08/17/15   Adonis BrookSheila Agustin, NP  lithium carbonate 300 MG capsule Take 1 capsule (300 mg total) by mouth 3 (three) times daily with meals. 08/17/15   Adonis BrookSheila Agustin, NP  metFORMIN (GLUCOPHAGE) 1000 MG tablet Take 1 tablet (1,000 mg total) by mouth 2 (two) times daily with  a meal. 08/17/15   Adonis Brook, NP  mirtazapine (REMERON) 45 MG tablet Take 1 tablet (45 mg total) by mouth at bedtime. 08/17/15   Adonis Brook, NP  simvastatin (ZOCOR) 20 MG tablet Take 1 tablet (20 mg total) by mouth daily at 6 PM. 08/17/15   Adonis Brook, NP   BP 112/83 mmHg  Pulse 115  Temp(Src) 97.5 F (36.4 C) (Oral)  Resp 18  SpO2 100%  LMP 08/05/2015 Physical Exam  Constitutional: She is oriented to person, place, and time. She appears well-developed and well-nourished. No distress.  HENT:  Head: Normocephalic and atraumatic.  No appreciable asymmetry, no discoloration, no drainage from the left  face, mouth, no appreciable obvious lesion.   Eyes: Conjunctivae and EOM are normal.  Cardiovascular: Normal rate and regular rhythm.   Pulmonary/Chest: Effort normal and breath sounds normal. No stridor. No respiratory distress.  Abdominal: She exhibits no distension.  Musculoskeletal: She exhibits no edema.  Neurological: She is alert and oriented to person, place, and time. No cranial nerve deficit.  Skin: Skin is warm and dry.  Psychiatric: She has a normal mood and affect. She expresses impulsivity. She expresses suicidal ideation. She expresses suicidal plans.  Nursing note and vitals reviewed.   ED Course  Procedures (including critical care time) Labs Review Labs Reviewed  COMPREHENSIVE METABOLIC PANEL - Abnormal; Notable for the following:    Glucose, Bld 165 (*)    Calcium 10.5 (*)    Total Protein 8.3 (*)    All other components within normal limits  ETHANOL - Abnormal; Notable for the following:    Alcohol, Ethyl (B) 80 (*)    All other components within normal limits  ACETAMINOPHEN LEVEL - Abnormal; Notable for the following:    Acetaminophen (Tylenol), Serum <10 (*)    All other components within normal limits  CBC - Abnormal; Notable for the following:    Hemoglobin 11.9 (*)    MCH 24.5 (*)    RDW 15.9 (*)    Platelets 481 (*)    All other components within normal limits  URINE RAPID DRUG SCREEN, HOSP PERFORMED - Abnormal; Notable for the following:    Benzodiazepines POSITIVE (*)    All other components within normal limits  CBG MONITORING, ED - Abnormal; Notable for the following:    Glucose-Capillary 147 (*)    All other components within normal limits  SALICYLATE LEVEL      MDM  Young female presents with endorsement of suicidal thoughts. Patient has history of psychiatric disease, and has elevated risk for suicide. Patient's physical exam was reassuring, though she does have mild dental pain. Vital signs reassuring beyond mild tachycardia. Patient  awaiting placement from our behavioral health team.  Gerhard Munch, MD 08/18/15 [phone]

## 2015-08-18 NOTE — Progress Notes (Signed)
CM spoke with pt who confirms uninsured [place] resident with no pcp.  CM discussed and provided written information for uninsured accepting pcps, discussed the importance of pcp vs EDP services for f/u care, www.needymeds.org, www.goodrx.com, discounted pharmacies and other [place] resources such as [facility] , [facility], affordable care act, financial assistance, uninsured dental services, [place] med assist, DSS and  health department  Reviewed resources for [place] uninsured accepting pcps like [facility], [facility] [address], [facility], [facility], local urgent care centers, [facility], [facility], general medical clinics, [facility], [facility] plus others, medication resources, [facility] out patient pharmacies and housing Pt voiced understanding and appreciation of resources provided   Provided P4CC contact information  

## 2015-08-18 NOTE — ED Notes (Signed)
[facility] and went to a boyfriends house then here, when RN walked in the room she had a plastic bag to her neck and she jump as if I scared her, then she stated that she wanted to die, and the she was being abused. Then she was taking some of her own neurotin and when asked what she was taking and how many she said all of them them and put them in her mouth. Pt then stated she was leaving and didn't mean any of it. Pt initially gave false name to registration.

## 2015-08-18 NOTE — ED Notes (Signed)
Pt given [employer] and [place] Sandwich, AAO x 3, no distress noted, awake, alert & responsive.

## 2015-08-18 NOTE — ED Notes (Signed)
Meal tray has arrived, pt is still sleeping.  I have her medications pulled but will allow her to get rest and check her CBG and medicate her once she wakes up to have breakfast

## 2015-08-18 NOTE — ED Notes (Signed)
Pt was given a coffee with 7 equal and many creamers per pt request

## 2015-08-18 NOTE — BHH Counselor (Signed)
Counselor faxed out supporting documentation to the following facilities for patient placement:  [facility], [facility], [facility], [facility], [facility], [facility]    [facility], [place]

## 2015-08-18 NOTE — Consult Note (Signed)
[place] [facility] Consult   Reason for Consult:  Suicidal ideation, Alcohol intoxication, Auditory hallucination. Referring Physician:  EDP Patient Identification: Carolyn Ayala MRN:  [id number] Principal Diagnosis: Depression, major, recurrent, severe with psychosis ([place]) Diagnosis:   Patient Active Problem List   Diagnosis Date Noted  . Alcohol use disorder, moderate, dependence ([place]) [F10.20] 08/10/2015    Priority: High  . Depression, major, recurrent, severe with psychosis ([place]) [F33.3] 08/06/2015    Priority: High  . Diabetes mellitus without complication ([place]) [J49.7]   . Hyperlipidemia [E78.5] 08/10/2015  . Neuropathic pain [M79.2] 08/08/2015  . Seasonal allergic rhinitis [J30.2] 08/08/2015  . Uncontrolled diabetes mellitus with neurologic complication, with long-term current use of insulin (HCC) [E11.49, Z79.4, E11.65]   . Uncontrolled diabetes mellitus with diabetic neuropathy, with long-term current use of insulin ([place]) [E11.40, Z79.4, E11.65] 04/21/2015  . Generalized anxiety disorder [F41.1] 02/13/2013  . Chronic posttraumatic stress disorder [F43.12] 02/13/2013  . Substance induced mood disorder ([place]) [F19.94] 02/08/2013  . IDDM (insulin dependent diabetes mellitus) ([place]) [E11.9, Z79.4] 02/08/2013    Total Time spent with patient: 45 minutes  Subjective:   Carolyn Ayala is a 40 y.o. female patient admitted with Suicide ideation, Alcohol intoxication, Auditory hallucination  HPI: AA female, 40 years old was evaluated today less than 24 hours after she was discharged from our [facility].  Patient reports that she did not feel her medications were working that she has constantly feels suicidal and started drinking Alcohol.  Patient reports that she has been feeling suicidal since the death of her mother  and wanting to go stay with her her.  Patient attempted suicide in the ER on arrival by placing a plastic bag over her head.  She also tried to  OD on pills in front of the RN in the ER.  Patient reports auditory hallucination with the voices telling her to die and join her late mother.  Patient states she is unable to contract for safety and has been accepted for admission.  We will be seeking placement at any facility with available beds.  Past Psychiatric History:  GAD, Depression , Substance induced mood disorder, , Chronic PTSD  Risk to Self: Suicidal Ideation: Yes-Currently Present Suicidal Intent: Yes-Currently Present Is patient at risk for suicide?: Yes Suicidal Plan?: Yes-Currently Present Specify Current Suicidal Plan: Put bag over head, overdosed, death by law enforcement Access to Means: Yes Specify Access to Suicidal Means: Put bag over head in ED, attempted to OD in ED What has been your use of drugs/alcohol within the last 12 months?: Daily alcohol use How many times?: 1 Other Self Harm Risks: Auditory hallucinations Triggers for Past Attempts: Hallucinations, Other (Comment) (Death of mother) Intentional Self Injurious Behavior: None Risk to Others: Homicidal Ideation: No Thoughts of Harm to Others: No Current Homicidal Intent: No Current Homicidal Plan: No Access to Homicidal Means: No Identified Victim: None History of harm to others?: No Assessment of Violence: None Noted Violent Behavior Description: Pt denies history of violence Does patient have access to weapons?: No Criminal Charges Pending?: No Does patient have a court date: No Prior Inpatient Therapy: Prior Inpatient Therapy: Yes Prior Therapy Dates: d/c from [facility] 08/17/15, multiple admits Prior Therapy Facilty/Provider(s): [facility] Reason for Treatment: Depression, alcohol abuse Prior Outpatient Therapy: Prior Outpatient Therapy: No Prior Therapy Dates: na Prior Therapy Facilty/Provider(s): na Reason for Treatment: na Does patient have an ACCT team?: No Does patient have Intensive In-House Services?  : No Does patient have [facility] services? :  No Does patient have P4CC services?: No  Past Medical History:  Past Medical History  Diagnosis Date  . Mental disorder   . Depression   . Diabetes mellitus without complication ([place])   . Hypertension     Past Surgical History  Procedure Laterality Date  . Cesarean section     Family History:  Family History  Problem Relation Age of Onset  . Depression Mother    Family Psychiatric  History:  denies Social History:  History  Alcohol Use  . Yes    Comment: daily     History  Drug Use No    Social History   Social History  . Marital Status: Single    Spouse Name: N/A  . Number of Children: N/A  . Years of Education: N/A   Social History Main Topics  . Smoking status: Never Smoker   . Smokeless tobacco: Current User  . Alcohol Use: Yes     Comment: daily  . Drug Use: No  . Sexual Activity: No   Other Topics Concern  . None   Social History Narrative   ** Merged History Encounter **       Additional Social History:    Allergies:  No Known Allergies  Labs:  Results for orders placed or performed during the hospital encounter of 08/18/15 (from the past 48 hour(s))  CBG monitoring, ED     Status: Abnormal   Collection Time: 08/18/15  4:28 AM  Result Value Ref Range   Glucose-Capillary 147 (H) 65 - 99 mg/dL   Comment 1 Notify RN    Comment 2 Document in Chart   Urine rapid drug screen (hosp performed) (Not at [facility])     Status: Abnormal   Collection Time: 08/18/15  5:03 AM  Result Value Ref Range   Opiates NONE DETECTED NONE DETECTED   Cocaine NONE DETECTED NONE DETECTED   Benzodiazepines POSITIVE (A) NONE DETECTED   Amphetamines NONE DETECTED NONE DETECTED   Tetrahydrocannabinol NONE DETECTED NONE DETECTED   Barbiturates NONE DETECTED NONE DETECTED    Comment:        DRUG SCREEN FOR MEDICAL PURPOSES ONLY.  IF CONFIRMATION IS NEEDED FOR ANY PURPOSE, NOTIFY LAB WITHIN 5 DAYS.        LOWEST DETECTABLE LIMITS FOR URINE DRUG SCREEN Drug Class        Cutoff (ng/mL) Amphetamine      1000 Barbiturate      200 Benzodiazepine   540 Tricyclics       981 Opiates          300 Cocaine          300 THC              50   Comprehensive metabolic panel     Status: Abnormal   Collection Time: 08/18/15  5:53 AM  Result Value Ref Range   Sodium 141 135 - 145 mmol/L   Potassium 4.1 3.5 - 5.1 mmol/L   Chloride 105 101 - 111 mmol/L   CO2 24 22 - 32 mmol/L   Glucose, Bld 165 (H) 65 - 99 mg/dL   BUN 9 6 - 20 mg/dL   Creatinine, Ser 0.48 0.44 - 1.00 mg/dL   Calcium 10.5 (H) 8.9 - 10.3 mg/dL   Total Protein 8.3 (H) 6.5 - 8.1 g/dL   Albumin 4.5 3.5 - 5.0 g/dL   AST 23 15 - 41 U/L   ALT 28 14 - 54 U/L   Alkaline Phosphatase  93 38 - 126 U/L   Total Bilirubin 0.4 0.3 - 1.2 mg/dL   GFR calc non Af Amer >60 >60 mL/min   GFR calc Af Amer >60 >60 mL/min    Comment: (NOTE) The eGFR has been calculated using the CKD EPI equation. This calculation has not been validated in all clinical situations. eGFR's persistently <60 mL/min signify possible Chronic Kidney Disease.    Anion gap 12 5 - 15  Ethanol (ETOH)     Status: Abnormal   Collection Time: 08/18/15  5:53 AM  Result Value Ref Range   Alcohol, Ethyl (B) 80 (H) <5 mg/dL    Comment:        LOWEST DETECTABLE LIMIT FOR SERUM ALCOHOL IS 5 mg/dL FOR MEDICAL PURPOSES ONLY   Salicylate level     Status: None   Collection Time: 08/18/15  5:53 AM  Result Value Ref Range   Salicylate Lvl <9.2 2.8 - 30.0 mg/dL  Acetaminophen level     Status: Abnormal   Collection Time: 08/18/15  5:53 AM  Result Value Ref Range   Acetaminophen (Tylenol), Serum <10 (L) 10 - 30 ug/mL    Comment:        THERAPEUTIC CONCENTRATIONS VARY SIGNIFICANTLY. A RANGE OF 10-30 ug/mL MAY BE AN EFFECTIVE CONCENTRATION FOR MANY PATIENTS. HOWEVER, SOME ARE BEST TREATED AT CONCENTRATIONS OUTSIDE THIS RANGE. ACETAMINOPHEN CONCENTRATIONS >150 ug/mL AT 4 HOURS AFTER INGESTION AND >50 ug/mL AT 12 HOURS AFTER INGESTION ARE OFTEN  ASSOCIATED WITH TOXIC REACTIONS.   CBC     Status: Abnormal   Collection Time: 08/18/15  5:53 AM  Result Value Ref Range   WBC 8.0 4.0 - 10.5 K/uL   RBC 4.85 3.87 - 5.11 MIL/uL   Hemoglobin 11.9 (L) 12.0 - 15.0 g/dL   HCT 38.1 36.0 - 46.0 %   MCV 78.6 78.0 - 100.0 fL   MCH 24.5 (L) 26.0 - 34.0 pg   MCHC 31.2 30.0 - 36.0 g/dL   RDW 15.9 (H) 11.5 - 15.5 %   Platelets 481 (H) 150 - 400 K/uL  CBG monitoring, ED     Status: Abnormal   Collection Time: 08/18/15  9:01 AM  Result Value Ref Range   Glucose-Capillary 164 (H) 65 - 99 mg/dL  CBG monitoring, ED     Status: Abnormal   Collection Time: 08/18/15  1:12 PM  Result Value Ref Range   Glucose-Capillary 127 (H) 65 - 99 mg/dL    Current Facility-Administered Medications  Medication Dose Route Frequency Provider Last Rate Last Dose  . ARIPiprazole (ABILIFY) tablet 25 mg  25 mg Oral Daily Carmin Muskrat, MD   Stopped at 08/18/15 1332  . fluticasone (FLONASE) 50 MCG/ACT nasal spray 1 spray  1 spray Each Nare Daily PRN Carmin Muskrat, MD      . gabapentin (NEURONTIN) capsule 900 mg  900 mg Oral TID PC & HS Carmin Muskrat, MD   900 mg at 08/18/15 1330  . hydrOXYzine (ATARAX/VISTARIL) tablet 50 mg  50 mg Oral Q6H PRN Carmin Muskrat, MD   50 mg at 08/18/15 1331  . insulin aspart (novoLOG) injection 0-15 Units  0-15 Units Subcutaneous TID WC Merrily Pew, MD      . insulin glargine (LANTUS) injection 60 Units  60 Units Subcutaneous BID Carmin Muskrat, MD   60 Units at 08/18/15 1100  . lithium carbonate (LITHOBID) CR tablet 300 mg  300 mg Oral Q12H Lachae Hohler, MD   300 mg at 08/18/15 1331  . metFORMIN (  GLUCOPHAGE) tablet 1,000 mg  1,000 mg Oral BID WC Carmin Muskrat, MD   1,000 mg at 08/18/15 1054  . mirtazapine (REMERON) tablet 45 mg  45 mg Oral QHS Carmin Muskrat, MD      . simvastatin (ZOCOR) tablet 20 mg  20 mg Oral q1800 Carmin Muskrat, MD       Current Outpatient Prescriptions  Medication Sig Dispense Refill  .  ARIPiprazole (ABILIFY) 5 MG tablet Take 5 tablets (25 mg total) by mouth daily. 150 tablet 0  . cyclobenzaprine (FLEXERIL) 10 MG tablet Take 10 mg by mouth 2 (two) times daily as needed for muscle spasms.    . fluticasone (FLONASE) 50 MCG/ACT nasal spray Place 1 spray into both nostrils daily as needed for allergies or rhinitis. 1 g 2  . gabapentin (NEURONTIN) 300 MG capsule Take 3 capsules (900 mg total) by mouth 4 (four) times daily - after meals and at bedtime. 90 capsule 0  . hydrOXYzine (ATARAX/VISTARIL) 50 MG tablet Take 1 tablet (50 mg total) by mouth every 6 (six) hours as needed for anxiety. 30 tablet 0  . ibuprofen (ADVIL,MOTRIN) 800 MG tablet Take 800 mg by mouth every 6 (six) hours as needed for headache or moderate pain.    Marland Kitchen insulin aspart (NOVOLOG) 100 UNIT/ML injection Inject 15 Units into the skin 3 (three) times daily with meals. 10 mL 11  . insulin glargine (LANTUS) 100 UNIT/ML injection Inject 0.6 mLs (60 Units total) into the skin 2 (two) times daily. 10 mL 11  . lisinopril (PRINIVIL,ZESTRIL) 10 MG tablet Take 10 mg by mouth daily.    Marland Kitchen lithium carbonate 300 MG capsule Take 1 capsule (300 mg total) by mouth 3 (three) times daily with meals. 90 capsule 0  . metFORMIN (GLUCOPHAGE) 1000 MG tablet Take 1 tablet (1,000 mg total) by mouth 2 (two) times daily with a meal. 60 tablet 0  . mirtazapine (REMERON) 45 MG tablet Take 1 tablet (45 mg total) by mouth at bedtime. 30 tablet 0  . Multiple Vitamin (MULTIVITAMIN WITH MINERALS) TABS tablet Take 1 tablet by mouth daily.    . ondansetron (ZOFRAN-ODT) 4 MG disintegrating tablet Take 4 mg by mouth every 8 (eight) hours as needed for nausea or vomiting.    . simvastatin (ZOCOR) 20 MG tablet Take 1 tablet (20 mg total) by mouth daily at 6 PM. 30 tablet 0  . Thiamine Mononitrate (VITAMIN B1 PO) Take 1 tablet by mouth daily.      Musculoskeletal: Strength & Muscle Tone: within normal limits Gait & Station: normal Patient leans:  N/A  Psychiatric Specialty Exam: Review of Systems  Constitutional: Negative.   HENT: Negative.   Eyes: Negative.   Respiratory: Negative.   Cardiovascular: Negative.   Gastrointestinal: Negative.   Genitourinary: Negative.   Musculoskeletal: Negative.   Skin: Negative.   Neurological: Negative.   Endo/Heme/Allergies:       Hx DM,    Blood pressure 122/82, pulse 115, temperature 98.2 F (36.8 C), temperature source Oral, resp. rate 18, last menstrual period 08/05/2015, SpO2 99 %.There is no weight on file to calculate BMI.  General Appearance: Casual and Fairly Groomed  Eye Contact::  Good  Speech:  Clear and Coherent and Normal Rate  Volume:  Normal  Mood:  Anxious and Depressed  Affect:  Congruent and Depressed  Thought Process:  Coherent, Goal Directed and Intact  Orientation:  Full (Time, Place, and Person)  Thought Content:  Hallucinations: Auditory  Suicidal Thoughts:  Yes.  with intent/plan  Homicidal Thoughts:  No  Memory:  Immediate;   Good Recent;   Good Remote;   Good  Judgement:  Poor  Insight:  Fair  Psychomotor Activity:  Psychomotor Retardation  Concentration:  Fair  Recall:  Good  Fund of Knowledge:Fair  Language: Good  Akathisia:  NA  Handed:  Right  AIMS (if indicated):     Assets:  Desire for Improvement Financial Resources/Insurance Housing  ADL's:  Intact  Cognition: WNL  Sleep:      Treatment Plan Summary: Daily contact with patient to assess and evaluate symptoms and progress in treatment and Medication management  Disposition: Accepted for admission and we will be seeking placement at any facility with available bed.  We have resumed all home medications.  Delfin Gant, NP   PMHNP-BC 08/18/2015 3:31 PM Patient seen face-to-face for psychiatric evaluation, chart reviewed and case discussed with the physician extender and developed treatment plan. Reviewed the information documented and agree with the treatment plan. Corena Pilgrim,  MD

## 2015-08-18 NOTE — ED Notes (Signed)
Pt AAO x 3, in dayroom at present, no distress noted, cooperative, slightly anxious.  Monitoring for safety, Q 15 min checks in effect.

## 2015-08-18 NOTE — Progress Notes (Signed)
Entered in d/c instructions  Please use the resources provided to you in emergency room by case manager to assist with doctor for follow up These [place] uninsured resources provide possible primary care providers, resources for discounted medications, housing, dental resources, affordable care act information, plus other resources for [place]   

## 2015-08-18 NOTE — ED Notes (Signed)
Glasses requested and given for puzzle book. Sitter aware

## 2015-08-19 LAB — CBG MONITORING, ED
Glucose-Capillary: 254 mg/dL — ABNORMAL HIGH (ref 65–99)
Glucose-Capillary: 280 mg/dL — ABNORMAL HIGH (ref 65–99)
Glucose-Capillary: 152 mg/dL — ABNORMAL HIGH (ref 65–99)
Glucose-Capillary: 181 mg/dL — ABNORMAL HIGH (ref 65–99)

## 2015-08-19 MED ORDER — IBUPROFEN 200 MG PO TABS
600.0000 mg | ORAL_TABLET | Freq: Four times a day (QID) | ORAL | Status: DC | PRN
  Administered 2015-08-19 – 2015-08-21 (×4): 600 mg via ORAL
  Filled 2015-08-19 (×4): qty 3

## 2015-08-19 MED ORDER — ARIPIPRAZOLE 10 MG PO TABS
20.0000 mg | ORAL_TABLET | Freq: Every day | ORAL | Status: DC
  Administered 2015-08-20 – 2015-08-22 (×3): 20 mg via ORAL
  Filled 2015-08-19 (×3): qty 2

## 2015-08-19 NOTE — Progress Notes (Signed)
[facility] and she states patient is not on the wait list.  Elenore PaddyLaVonia Bryar Rennie, LCSWA [phone] ED CSW 08/19/2015 4:15 PM

## 2015-08-19 NOTE — BH Assessment (Signed)
Reassessment:   Writer met with patient to complete a reassessment. Patient was attentive, cooperative, and polite. Patient acknowledges that she needs help. Sts that she placed a plastic bag over her head and also ingested pills in front of nursing staff in suicide attempt. She also reports trouble with alcohol use. She admits that she made comments to kill herself by purchasing a gun at [facility] and threatening law enforcement so they will shoot her. Patient continues to endorse suicidal ideations. She has tried to commit suicide in the past by overdosing. Patient reports that her depression is a result of her mothers death. Sts that she has experienced issues with bereavement and wants special help. Patient is requesting treatment at [facility]. Sts, "They have special group threrapy for bereavement".   Pt is dressed in hospital scrubs, alert, oriented x4 with normal speech and normal motor behavior. Eye contact is good and Pt is tearful. [name] mood is depressed. Patient'saffect is labile. Thought process is coherent and relevant.

## 2015-08-19 NOTE — ED Notes (Signed)
MD notified that pt is requesting orders for B1 vitamin, Motrin, flerxeril and a multivitamin.

## 2015-08-19 NOTE — ED Notes (Signed)
Pt AAO x 3, no distress noted, calm & cooperative.  Monitoring for safety, Q 15 min checks in effect. 

## 2015-08-20 LAB — CBG MONITORING, ED
Glucose-Capillary: 216 mg/dL — ABNORMAL HIGH (ref 65–99)
Glucose-Capillary: 275 mg/dL — ABNORMAL HIGH (ref 65–99)
Glucose-Capillary: 206 mg/dL — ABNORMAL HIGH (ref 65–99)
Glucose-Capillary: 300 mg/dL — ABNORMAL HIGH (ref 65–99)

## 2015-08-20 NOTE — ED Notes (Signed)
Patient continues to state that she is suicidal, but has been out of her room and interacting with peers, tolerating diet, doing word searches and watching television all day.  States she wants to go to [facility] and actually called them today and reported to me that they had beds available and only needed a referral.  Carolyn Ayala is aware.

## 2015-08-20 NOTE — Consult Note (Signed)
Psychiatric Specialty Exam: Physical Exam  ROS  Blood pressure 123/87, pulse 109, temperature 98.2 F (36.8 C), temperature source Oral, resp. rate 16, last menstrual period 08/05/2015, SpO2 100 %.There is no weight on file to calculate BMI.  General Appearance: Casual and Fairly Groomed  Eye Contact:: Good  Speech: Clear and Coherent and Normal Rate  Volume: Normal  Mood: Anxious and Depressed  Affect: Congruent and Depressed  Thought Process: Coherent, Goal Directed and Intact  Orientation: Full (Time, Place, and Person)  Thought Content: Hallucinations: Auditory  Suicidal Thoughts: Yes. with intent/plan  Homicidal Thoughts: No  Memory: Immediate; Good Recent; Good Remote; Good  Judgement: Poor  Insight: Fair  Psychomotor Activity: Psychomotor Retardation  Concentration: Fair  Recall: Good  Fund of Knowledge:Fair  Language: Good  Akathisia: NA  Handed: Right  AIMS (if indicated):    Assets: Desire for Improvement Financial Resources/Insurance Housing  ADL's: Intact  Cognition: WNL         Patient was seen in room today stating that she still feeling suicidal and hearing voices telling her she is not worthy to live.  Patient states she is in need of inpatient hospitalization and is willing to receive grieving treatment for the death of her mother.  Patient states that she is feeling depressed, sad and suicidal as she felt two days ago.  Patient denies HI/VH.  [facility] and [facility]. Depression, major, recurrent, severe with psychosis (HCC)   Plan: Continue plan of care, [facility] referral  And other facilities with available bed.  Dahlia ByesJosephine Onuoha   PMHNP-BC Patient seen face-to-face for psychiatric evaluation, chart reviewed and case discussed with the physician extender and developed treatment plan. Reviewed the information documented and agree with the treatment plan. Thedore MinsMojeed Ayelen Sciortino, MD

## 2015-08-20 NOTE — Progress Notes (Signed)
CSW spoke with Carolyn CooksSheryl Ayala, [facility] [phone] regarding referral status. She stated either she would call back or another individual would call back regarding status.   Carolyn PaddyLaVonia Kirsten Ayala, LCSWA [phone] ED CSW 08/20/2015 1:00 PM

## 2015-08-20 NOTE — ED Notes (Signed)
Patient appears cooperative, attention seeking. Denies SI, HI, AVH at present. Endorses feelings of anxiety and rates depression 7/10. States she has had no recent changes in sleep or appetite. Reports Remeron helps her sleep.  Encouragement offered.  Q 15 safety checks continue.

## 2015-08-21 LAB — URINALYSIS, ROUTINE W REFLEX MICROSCOPIC
Bilirubin Urine: NEGATIVE
Glucose, UA: NEGATIVE mg/dL
Hgb urine dipstick: NEGATIVE
Ketones, ur: NEGATIVE mg/dL
Nitrite: NEGATIVE
Protein, ur: NEGATIVE mg/dL
Specific Gravity, Urine: 1.011 (ref 1.005–1.030)
pH: 6 (ref 5.0–8.0)

## 2015-08-21 LAB — CBG MONITORING, ED
Glucose-Capillary: 75 mg/dL (ref 65–99)
Glucose-Capillary: 271 mg/dL — ABNORMAL HIGH (ref 65–99)
Glucose-Capillary: 204 mg/dL — ABNORMAL HIGH (ref 65–99)
Glucose-Capillary: 189 mg/dL — ABNORMAL HIGH (ref 65–99)
Glucose-Capillary: 185 mg/dL — ABNORMAL HIGH (ref 65–99)

## 2015-08-21 LAB — URINE MICROSCOPIC-ADD ON: RBC / HPF: NONE SEEN RBC/hpf (ref 0–5)

## 2015-08-21 LAB — LITHIUM LEVEL: Lithium Lvl: 0.59 mmol/L — ABNORMAL LOW (ref 0.60–1.20)

## 2015-08-21 LAB — PREGNANCY, URINE: Preg Test, Ur: NEGATIVE

## 2015-08-21 MED ORDER — ACETAMINOPHEN 500 MG PO TABS
1000.0000 mg | ORAL_TABLET | Freq: Four times a day (QID) | ORAL | Status: DC | PRN
  Administered 2015-08-21 – 2015-08-22 (×3): 1000 mg via ORAL
  Filled 2015-08-21 (×3): qty 2

## 2015-08-21 MED ORDER — CYCLOBENZAPRINE HCL 10 MG PO TABS
5.0000 mg | ORAL_TABLET | Freq: Two times a day (BID) | ORAL | Status: DC | PRN
  Administered 2015-08-21 (×2): 5 mg via ORAL
  Filled 2015-08-21 (×2): qty 1

## 2015-08-21 MED ORDER — VITAMIN B-1 100 MG PO TABS
100.0000 mg | ORAL_TABLET | Freq: Every day | ORAL | Status: DC
  Administered 2015-08-21 – 2015-08-22 (×2): 100 mg via ORAL
  Filled 2015-08-21 (×2): qty 1

## 2015-08-21 NOTE — ED Notes (Signed)
Pt. Noted in room. No complaints or concerns voiced. No distress or abnormal behavior noted. Will continue to monitor with security cameras. Q 15 minute rounds continue. 

## 2015-08-21 NOTE — ED Notes (Signed)
Pt. C/o anxiety and insomnia, as well as back pain at 8.

## 2015-08-21 NOTE — ED Notes (Signed)
Report received from Janie Rambo RN. Pt. Alert and oriented in no distress denies SI, HI, AVH and pain.  Pt. Instructed to come to me with problems or concerns.Will continue to monitor for safety via security cameras and Q 15 minute checks. 

## 2015-08-21 NOTE — ED Notes (Signed)
In day room, lab into draw

## 2015-08-21 NOTE — BH Assessment (Signed)
Patient was reassessed by TTS.   Patient was in her room speaking with this nurse and this writer waited to speak with the patient.  Patient states that her mother passed away and she is having difficulty coping and wants to kill herself. Patient states that she attempted to inject herself with insulin and that did not work and she also has a plan to run in front of a car. Patient states that she has these thoughts with a plan "off and on." Patient denies HI. Patient states that she hears voices "telling me I'm hopeless and to harm myself." Patient states that she has heard these voices "real bad for the past two months." Patient denies visual hallucinations. Patient states that she would like to be placed in a facility to get treatment and asked when she would be accepted. Patient was informed that inpatient is recommended at this time and TTS will continue to seek placement and she will be notified once she is accepted at a facility.   Dr. Jannifer FranklinAkintayo and Nanine MeansJamison Lord, DNP continues to recommend inpatient treatment at this time.    Davina PokeJoVea Chanay Nugent, LCSW Therapeutic Triage Specialist [facility] 08/21/2015 10:12 AM

## 2015-08-21 NOTE — ED Notes (Signed)
On the phone 

## 2015-08-21 NOTE — Progress Notes (Signed)
CSW Spoke with [facility] of [facility] who [facility]als, and MAR.   CSW faxed the requested information.  Trish MageBrittney Kieran Arreguin, LCSWA [phone] ED CSW 08/21/2015 9:40 PM

## 2015-08-21 NOTE — ED Notes (Signed)
Pt. Noted in room. No distress or abnormal behavior noted. Will continue to monitor with security cameras. Q 15 minute rounds continue. Sandwich and cheese and crackers as well as soft drink given.

## 2015-08-22 LAB — CBG MONITORING, ED
Glucose-Capillary: 177 mg/dL — ABNORMAL HIGH (ref 65–99)
Glucose-Capillary: 233 mg/dL — ABNORMAL HIGH (ref 65–99)

## 2015-08-22 LAB — HEMOGLOBIN A1C
Hgb A1c MFr Bld: 11 % — ABNORMAL HIGH (ref 4.8–5.6)
Mean Plasma Glucose: 269 mg/dL

## 2015-08-22 MED ORDER — LITHIUM CARBONATE ER 300 MG PO TBCR: 300.0000 mg | EXTENDED_RELEASE_TABLET | Freq: Two times a day (BID) | ORAL | Status: DC

## 2015-08-22 MED ORDER — GABAPENTIN 300 MG PO CAPS: 900.0000 mg | ORAL_CAPSULE | Freq: Three times a day (TID) | ORAL | Status: DC

## 2015-08-22 MED ORDER — CYCLOBENZAPRINE HCL 5 MG PO TABS: 5.0000 mg | ORAL_TABLET | Freq: Two times a day (BID) | ORAL | Status: DC | PRN

## 2015-08-22 MED ORDER — ARIPIPRAZOLE 20 MG PO TABS: 20.0000 mg | ORAL_TABLET | Freq: Every day | ORAL | Status: DC

## 2015-08-22 MED ORDER — HYDROXYZINE HCL 50 MG PO TABS: 50.0000 mg | ORAL_TABLET | Freq: Four times a day (QID) | ORAL | Status: DC | PRN

## 2015-08-22 NOTE — ED Notes (Signed)
Pt. Noted sleeping in room. No complaints or concerns voiced. No distress or abnormal behavior noted. Will continue to monitor with security cameras. Q 15 minute rounds continue. 

## 2015-08-22 NOTE — ED Notes (Signed)
In the bathroom

## 2015-08-22 NOTE — ED Notes (Signed)
Pt. C/o pain and anxiety.

## 2015-08-22 NOTE — ED Notes (Signed)
Sheriff will be here in approx 30 mins, pt is aware, contact information given

## 2015-08-22 NOTE — Clinical Social Work Note (Signed)
[facility] is Vivien RossettiBarbara  Davis.  Elray Buba.Jamilla Galli, LCSW [facility] Clinical Social Worker - Weekend Coverage cell #: [phone]

## 2015-08-22 NOTE — Clinical Social Work Note (Signed)
[facility] with [facility] and [facility] have a bed for pt today.  They will need [facility] after morning medications are given.   Elray Buba.Mahaila Tischer, LCSW [facility] Clinical Social Worker - Weekend Coverage cell #: [phone]

## 2015-08-22 NOTE — ED Notes (Signed)
Pt. Noted in room. No complaints or concerns voiced. No distress or abnormal behavior noted. Will continue to monitor with security cameras. Q 15 minute rounds continue. 

## 2015-08-22 NOTE — ED Notes (Addendum)
Pt ambulatory w/o difficulty with sheriff to [facility].  IVC papers, EMTELA, face sheet, MAR report, transfer report, lab results, assessment note, and belongings sent with sheriff.  Snack (graham crackers, peanut butter and juice) given to sheriff for pt.

## 2015-08-22 NOTE — ED Notes (Signed)
Message left for sheriff to transport 

## 2015-08-22 NOTE — ED Notes (Signed)
Sheriff is here to transport 

## 2015-08-22 NOTE — ED Notes (Signed)
Up to the bathroom 

## 2015-08-22 NOTE — ED Notes (Signed)
[facility] and can transport today per CadizRegina CSW

## 2015-08-22 NOTE — ED Notes (Addendum)
Carolyn RossettiBarbara Davis RN updated that pt is in transit , last  blood sugar reading ( pt declined additional check) and that 1200  Insulin had not been given.  (lunch has not been delivered), snack provided for pt.

## 2015-08-22 NOTE — ED Notes (Signed)
Pt. Noted sleeping in room. No complaints or concerns voiced. No distress or abnormal behavior noted. Will continue to monitor with security cameras. Q 15 minute rounds continue.l 

## 2015-09-20 ENCOUNTER — Encounter (HOSPITAL_COMMUNITY): Payer: Self-pay | Admitting: *Deleted

## 2015-09-20 ENCOUNTER — Emergency Department (HOSPITAL_COMMUNITY)
Admission: EM | Admit: 2015-09-20 | Discharge: 2015-09-21 | Disposition: A | Discharge status: Home / Self Care | Payer: Self-pay | Attending: Emergency Medicine | Admitting: Emergency Medicine

## 2015-09-20 DIAGNOSIS — E1165 Type 2 diabetes mellitus with hyperglycemia: Secondary | ICD-10-CM | POA: Insufficient documentation

## 2015-09-20 DIAGNOSIS — Z3202 Encounter for pregnancy test, result negative: Secondary | ICD-10-CM | POA: Insufficient documentation

## 2015-09-20 DIAGNOSIS — I10 Essential (primary) hypertension: Secondary | ICD-10-CM | POA: Insufficient documentation

## 2015-09-20 DIAGNOSIS — R739 Hyperglycemia, unspecified: Secondary | ICD-10-CM

## 2015-09-20 DIAGNOSIS — Z79899 Other long term (current) drug therapy: Secondary | ICD-10-CM | POA: Insufficient documentation

## 2015-09-20 DIAGNOSIS — Z794 Long term (current) use of insulin: Secondary | ICD-10-CM | POA: Insufficient documentation

## 2015-09-20 DIAGNOSIS — Z7984 Long term (current) use of oral hypoglycemic drugs: Secondary | ICD-10-CM | POA: Insufficient documentation

## 2015-09-20 DIAGNOSIS — F329 Major depressive disorder, single episode, unspecified: Secondary | ICD-10-CM | POA: Insufficient documentation

## 2015-09-20 DIAGNOSIS — F10920 Alcohol use, unspecified with intoxication, uncomplicated: Secondary | ICD-10-CM

## 2015-09-20 DIAGNOSIS — F10120 Alcohol abuse with intoxication, uncomplicated: Secondary | ICD-10-CM | POA: Insufficient documentation

## 2015-09-20 LAB — CBG MONITORING, ED: Glucose-Capillary: 413 mg/dL — ABNORMAL HIGH (ref 65–99)

## 2015-09-20 NOTE — ED Notes (Signed)
EMS called to [facility].  Found patient with complaints of vomiting.  Patient has not vomited with EMS.  Currently denies any pain .

## 2015-09-20 NOTE — ED Notes (Signed)
Bed: WA18 Expected date:  Expected time:  Means of arrival:  Comments: EMS-vomiting 

## 2015-09-21 LAB — COMPREHENSIVE METABOLIC PANEL
ALT: 105 U/L — ABNORMAL HIGH (ref 14–54)
AST: 59 U/L — ABNORMAL HIGH (ref 15–41)
Albumin: 3.8 g/dL (ref 3.5–5.0)
Alkaline Phosphatase: 99 U/L (ref 38–126)
Anion gap: 12 (ref 5–15)
BUN: 10 mg/dL (ref 6–20)
CO2: 22 mmol/L (ref 22–32)
Calcium: 9.5 mg/dL (ref 8.9–10.3)
Chloride: 104 mmol/L (ref 101–111)
Creatinine, Ser: 0.7 mg/dL (ref 0.44–1.00)
GFR calc Af Amer: >60 mL/min (ref >60)
GFR calc non Af Amer: >60 mL/min (ref >60)
Glucose, Bld: 430 mg/dL — ABNORMAL HIGH (ref 65–99)
Potassium: 4.2 mmol/L (ref 3.5–5.1)
Sodium: 138 mmol/L (ref 135–145)
Total Bilirubin: 0.2 mg/dL — ABNORMAL LOW (ref 0.3–1.2)
Total Protein: 7.2 g/dL (ref 6.5–8.1)

## 2015-09-21 LAB — CBC WITH DIFFERENTIAL/PLATELET
Basophils Absolute: 0 K/uL (ref 0.0–0.1)
Basophils Relative: 0 %
Eosinophils Absolute: 0.4 K/uL (ref 0.0–0.7)
Eosinophils Relative: 3 %
HCT: 35.1 % — ABNORMAL LOW (ref 36.0–46.0)
Hemoglobin: 11.4 g/dL — ABNORMAL LOW (ref 12.0–15.0)
Lymphocytes Relative: 31 %
Lymphs Abs: 3.9 K/uL (ref 0.7–4.0)
MCH: 25.3 pg — ABNORMAL LOW (ref 26.0–34.0)
MCHC: 32.5 g/dL (ref 30.0–36.0)
MCV: 78 fL (ref 78.0–100.0)
Monocytes Absolute: 0.4 K/uL (ref 0.1–1.0)
Monocytes Relative: 4 %
Neutro Abs: 7.6 K/uL (ref 1.7–7.7)
Neutrophils Relative %: 62 %
Platelets: 360 K/uL (ref 150–400)
RBC: 4.5 MIL/uL (ref 3.87–5.11)
RDW: 16.9 % — ABNORMAL HIGH (ref 11.5–15.5)
WBC: 12.3 K/uL — ABNORMAL HIGH (ref 4.0–10.5)

## 2015-09-21 LAB — CBG MONITORING, ED: Glucose-Capillary: 368 mg/dL — ABNORMAL HIGH (ref 65–99)

## 2015-09-21 LAB — I-STAT BETA HCG BLOOD, ED (MC, WL, AP ONLY): I-stat hCG, quantitative: <5 m[IU]/mL (ref <5)

## 2015-09-21 LAB — ETHANOL: Alcohol, Ethyl (B): 138 mg/dL — ABNORMAL HIGH

## 2015-09-21 LAB — KETONES, URINE: Ketones, ur: NEGATIVE mg/dL

## 2015-09-21 LAB — LITHIUM LEVEL: Lithium Lvl: 0.38 mmol/L — ABNORMAL LOW (ref 0.60–1.20)

## 2015-09-21 MED ORDER — SODIUM CHLORIDE 0.9 % IV BOLUS (SEPSIS)
1000.0000 mL | Freq: Once | INTRAVENOUS | Status: AC
  Administered 2015-09-21: 1000 mL via INTRAVENOUS

## 2015-09-21 MED ORDER — NALOXONE HCL 2 MG/2ML IJ SOSY
1.0000 mg | PREFILLED_SYRINGE | Freq: Once | INTRAMUSCULAR | Status: AC
  Administered 2015-09-21: 1 mg via INTRAVENOUS

## 2015-09-21 MED ORDER — NALOXONE HCL 2 MG/2ML IJ SOSY
PREFILLED_SYRINGE | INTRAMUSCULAR | Status: AC
  Administered 2015-09-21: 1 mg via INTRAVENOUS
  Filled 2015-09-21: qty 2

## 2015-09-21 NOTE — ED Notes (Signed)
Patient was ambulatory on arrival to the ED.  After patient ambulated to restroom and IV was placed and the patient became lethargic.  Possible concerns of patient taking outside medications.  PA-C notified.

## 2015-09-21 NOTE — Discharge Instructions (Signed)
Alcohol Intoxication Alcohol intoxication occurs when the amount of alcohol that a person has consumed impairs his or her ability to mentally and physically function. Alcohol directly impairs the normal chemical activity of the brain. Drinking large amounts of alcohol can lead to changes in mental function and behavior, and it can cause many physical effects that can be harmful.  Alcohol intoxication can range in severity from mild to very severe. Various factors can affect the level of intoxication that occurs, such as the person's age, gender, weight, frequency of alcohol consumption, and the presence of other medical conditions (such as diabetes, seizures, or heart conditions). Dangerous levels of alcohol intoxication may occur when people drink large amounts of alcohol in a short period (binge drinking). Alcohol can also be especially dangerous when combined with certain prescription medicines or "recreational" drugs. SIGNS AND SYMPTOMS Some common signs and symptoms of mild alcohol intoxication include:  Loss of coordination.  Changes in mood and behavior.  Impaired judgment.  Slurred speech. As alcohol intoxication progresses to more severe levels, other signs and symptoms will appear. These may include:  Vomiting.  Confusion and impaired memory.  Slowed breathing.  Seizures.  Loss of consciousness. DIAGNOSIS  Your health care provider will take a medical history and perform a physical exam. You will be asked about the amount and type of alcohol you have consumed. Blood tests will be done to measure the concentration of alcohol in your blood. In many places, your blood alcohol level must be lower than 80 mg/dL (0.08%) to legally drive. However, many dangerous effects of alcohol can occur at much lower levels.  TREATMENT  People with alcohol intoxication often do not require treatment. Most of the effects of alcohol intoxication are temporary, and they go away as the alcohol naturally  leaves the body. Your health care provider will monitor your condition until you are stable enough to go home. Fluids are sometimes given through an IV access tube to help prevent dehydration.  HOME CARE INSTRUCTIONS  Do not drive after drinking alcohol.  Stay hydrated. Drink enough water and fluids to keep your urine clear or pale yellow. Avoid caffeine.   Only take over-the-counter or prescription medicines as directed by your health care provider.  SEEK MEDICAL CARE IF:   You have persistent vomiting.   You do not feel better after a few days.  You have frequent alcohol intoxication. Your health care provider can help determine if you should see a substance use treatment counselor. SEEK IMMEDIATE MEDICAL CARE IF:   You become shaky or tremble when you try to stop drinking.   You shake uncontrollably (seizure).   You throw up (vomit) blood. This may be bright red or may look like black coffee grounds.   You have blood in your stool. This may be bright red or may appear as a black, tarry, bad smelling stool.   You become lightheaded or faint.  MAKE SURE YOU:   Understand these instructions.  Will watch your condition.  Will get help right away if you are not doing well or get worse.   This information is not intended to replace advice given to you by your health care provider. Make sure you discuss any questions you have with your health care provider.   Document Released: 03/09/2005 Document Revised: 01/30/2013 Document Reviewed: 11/02/2012 Elsevier Interactive Patient Education 2016 [employer].  Alcohol Use Disorder Alcohol use disorder is a mental disorder. It is not a one-time incident of heavy drinking. Alcohol use  disorder is the excessive and uncontrollable use of alcohol over time that leads to problems with functioning in one or more areas of daily living. People with this disorder risk harming themselves and others when they drink to excess. Alcohol use  disorder also can cause other mental disorders, such as mood and anxiety disorders, and serious physical problems. People with alcohol use disorder often misuse other drugs.  Alcohol use disorder is common and widespread. Some people with this disorder drink alcohol to cope with or escape from negative life events. Others drink to relieve chronic pain or symptoms of mental illness. People with a family history of alcohol use disorder are at higher risk of losing control and using alcohol to excess.  Drinking too much alcohol can cause injury, accidents, and health problems. One drink can be too much when you are:  Working.  Pregnant or breastfeeding.  Taking medicines. Ask your doctor.  Driving or planning to drive. SYMPTOMS  Signs and symptoms of alcohol use disorder may include the following:   Consumption ofalcohol inlarger amounts or over a longer period of time than intended.  Multiple unsuccessful attempts to cutdown or control alcohol use.   A great deal of time spent obtaining alcohol, using alcohol, or recovering from the effects of alcohol (hangover).  A strong desire or urge to use alcohol (cravings).   Continued use of alcohol despite problems at work, school, or home because of alcohol use.   Continued use of alcohol despite problems in relationships because of alcohol use.  Continued use of alcohol in situations when it is physically hazardous, such as driving a car.  Continued use of alcohol despite awareness of a physical or psychological problem that is likely related to alcohol use. Physical problems related to alcohol use can involve the brain, heart, liver, stomach, and intestines. Psychological problems related to alcohol use include intoxication, depression, anxiety, psychosis, delirium, and dementia.   The need for increased amounts of alcohol to achieve the same desired effect, or a decreased effect from the consumption of the same amount of alcohol  (tolerance).  Withdrawal symptoms upon reducing or stopping alcohol use, or alcohol use to reduce or avoid withdrawal symptoms. Withdrawal symptoms include:  Racing heart.  Hand tremor.  Difficulty sleeping.  Nausea.  Vomiting.  Hallucinations.  Restlessness.  Seizures. DIAGNOSIS Alcohol use disorder is diagnosed through an assessment by your health care provider. Your health care provider may start by asking three or four questions to screen for excessive or problematic alcohol use. To confirm a diagnosis of alcohol use disorder, at least two symptoms must be present within a 62-month period. The severity of alcohol use disorder depends on the number of symptoms:  Mild--two or three.  Moderate--four or five.  Severe--six or more. Your health care provider may perform a physical exam or use results from lab tests to see if you have physical problems resulting from alcohol use. Your health care provider may refer you to a mental health professional for evaluation. TREATMENT  Some people with alcohol use disorder are able to reduce their alcohol use to low-risk levels. Some people with alcohol use disorder need to quit drinking alcohol. When necessary, mental health professionals with specialized training in substance use treatment can help. Your health care provider can help you decide how severe your alcohol use disorder is and what type of treatment you need. The following forms of treatment are available:   Detoxification. Detoxification involves the use of prescription medicines to prevent alcohol  withdrawal symptoms in the first week after quitting. This is important for people with a history of symptoms of withdrawal and for heavy drinkers who are likely to have withdrawal symptoms. Alcohol withdrawal can be dangerous and, in severe cases, cause death. Detoxification is usually provided in a hospital or in-patient substance use treatment facility.  Counseling or talk therapy.  Talk therapy is provided by substance use treatment counselors. It addresses the reasons people use alcohol and ways to keep them from drinking again. The goals of talk therapy are to help people with alcohol use disorder find healthy activities and ways to cope with life stress, to identify and avoid triggers for alcohol use, and to handle cravings, which can cause relapse.  Medicines.Different medicines can help treat alcohol use disorder through the following actions:  Decrease alcohol cravings.  Decrease the positive reward response felt from alcohol use.  Produce an uncomfortable physical reaction when alcohol is used (aversion therapy).  Support groups. Support groups are run by people who have quit drinking. They provide emotional support, advice, and guidance. These forms of treatment are often combined. Some people with alcohol use disorder benefit from intensive combination treatment provided by specialized substance use treatment centers. Both inpatient and outpatient treatment programs are available.   This information is not intended to replace advice given to you by your health care provider. Make sure you discuss any questions you have with your health care provider.   Document Released: 07/07/2004 Document Revised: 06/20/2014 Document Reviewed: 09/06/2012 Elsevier Interactive Patient Education 2016 Elsevier Inc.  Hyperglycemia Hyperglycemia occurs when the glucose (sugar) in your blood is too high. Hyperglycemia can happen for many reasons, but it most often happens to people who do not know they have diabetes or are not managing their diabetes properly.  CAUSES  Whether you have diabetes or not, there are other causes of hyperglycemia. Hyperglycemia can occur when you have diabetes, but it can also occur in other situations that you might not be as aware of, such as: Diabetes  If you have diabetes and are having problems controlling your blood glucose, hyperglycemia could  occur because of some of the following reasons:  Not following your meal plan.  Not taking your diabetes medications or not taking it properly.  Exercising less or doing less activity than you normally do.  Being sick. Pre-diabetes  This cannot be ignored. Before people develop Type 2 diabetes, they almost always have "pre-diabetes." This is when your blood glucose levels are higher than normal, but not yet high enough to be diagnosed as diabetes. Research has shown that some long-term damage to the body, especially the heart and circulatory system, may already be occurring during pre-diabetes. If you take action to manage your blood glucose when you have pre-diabetes, you may delay or prevent Type 2 diabetes from developing. Stress  If you have diabetes, you may be "diet" controlled or on oral medications or insulin to control your diabetes. However, you may find that your blood glucose is higher than usual in the hospital whether you have diabetes or not. This is often referred to as "stress hyperglycemia." Stress can elevate your blood glucose. This happens because of hormones put out by the body during times of stress. If stress has been the cause of your high blood glucose, it can be followed regularly by your caregiver. That way he/she can make sure your hyperglycemia does not continue to get worse or progress to diabetes. Steroids  Steroids are medications that act on  the infection fighting system (immune system) to block inflammation or infection. One side effect can be a rise in blood glucose. Most people can produce enough extra insulin to allow for this rise, but for those who cannot, steroids make blood glucose levels go even higher. It is not unusual for steroid treatments to "uncover" diabetes that is developing. It is not always possible to determine if the hyperglycemia will go away after the steroids are stopped. A special blood test called an A1c is sometimes done to determine if  your blood glucose was elevated before the steroids were started. SYMPTOMS  Thirsty.  Frequent urination.  Dry mouth.  Blurred vision.  Tired or fatigue.  Weakness.  Sleepy.  Tingling in feet or leg. DIAGNOSIS  Diagnosis is made by monitoring blood glucose in one or all of the following ways:  A1c test. This is a chemical found in your blood.  Fingerstick blood glucose monitoring.  Laboratory results. TREATMENT  First, knowing the cause of the hyperglycemia is important before the hyperglycemia can be treated. Treatment may include, but is not be limited to:  Education.  Change or adjustment in medications.  Change or adjustment in meal plan.  Treatment for an illness, infection, etc.  More frequent blood glucose monitoring.  Change in exercise plan.  Decreasing or stopping steroids.  Lifestyle changes. HOME CARE INSTRUCTIONS   Test your blood glucose as directed.  Exercise regularly. Your caregiver will give you instructions about exercise. Pre-diabetes or diabetes which comes on with stress is helped by exercising.  Eat wholesome, balanced meals. Eat often and at regular, fixed times. Your caregiver or nutritionist will give you a meal plan to guide your sugar intake.  Being at an ideal weight is important. If needed, losing as little as 10 to 15 pounds may help improve blood glucose levels. SEEK MEDICAL CARE IF:   You have questions about medicine, activity, or diet.  You continue to have symptoms (problems such as increased thirst, urination, or weight gain). SEEK IMMEDIATE MEDICAL CARE IF:   You are vomiting or have diarrhea.  Your breath smells fruity.  You are breathing faster or slower.  You are very sleepy or incoherent.  You have numbness, tingling, or pain in your feet or hands.  You have chest pain.  Your symptoms get worse even though you have been following your caregiver's orders.  If you have any other questions or concerns.     This information is not intended to replace advice given to you by your health care provider. Make sure you discuss any questions you have with your health care provider.   Document Released: 11/23/2000 Document Revised: 08/22/2011 Document Reviewed: 02/03/2015 Elsevier Interactive Patient Education [employer]

## 2015-09-21 NOTE — ED Notes (Addendum)
After patient became difficult to arouse her belongings were removed from the bedside. Patient has a blue bookbag, a black pocketbook, and two patient belonging bags, one of which contains all of her medications collected from her other belongings. Medications are as follows:  Mirtazapine 15 mg: 26 individual pills loose, 7 pills in a bottle, and 20 pills in a bag Zocor 20mg : 7 pills in a bag Hydroxyzine 50mg : 8 pills in a bag, 109 pills in a bottle Zyrtec 10mg : empty bottle Gabapentin 300mg : 70 pills in a bag, 76 pills in a bottle, 75 pills in a bottle, 69 pills in a bottle Humalog insulin: 1 opened vial in a bag Citalopram 40mg : 28 pills in a bottle Abilify 10mg : 18 pills in a bag Metformin 500mg : 28 pills in a bag Lithium 300mg : 21 pills in a bag, 86 pills in a bottle Lantus: 3 full vials in unopened boxes, 1 open vial in a bag Benadryl 25mg : 22 pills in a box Vitamin E 400iu: 1 bottle (uncounted) Vitamin B1 250mg : 1 bottle (uncounted) Al[employer] Energy: 1 bottle (uncounted) Simvastatin 20mg : 29 pills in a bottle  Patient also has 17 insulin syringes, placed in the bag with her medications.

## 2015-09-21 NOTE — ED Provider Notes (Signed)
CSN: [id number]     Arrival date & time 09/20/15  2353 History  By signing my name below, I, [name], attest that this documentation has been prepared under the direction and in the presence of non-physician practitioner, Dorthula Matas, PA-C. Electronically Signed: [name], Scribe. 09/21/2015. 1:23 AM.   Chief Complaint  Patient presents with  . Emesis   The history is provided by the patient. No language interpreter was used.   HPI Comments:  Carolyn Ayala is a 40 y.o. female with PMHx of mental disorder, depression, DM, and HTN who presents to the Emergency Department via EMS complaining of persistent intermittent episodes of emesis onset ~2000 yesterday. Pt states she is vomiting anything she eats or drinks. She reports associated difficulty sleeping recently. Pt was recently released from a psychiatric facility following treatment for depression; she was started on Celexa and Lithium but is unsure of dosages. Pt is drowsy on exam and states she is sleepy because of her medications or due to unknown substances. She denies drugs or alcohol use and says I can test her system.. Pt is diabetic and sugars have been in the 400s. Pt denies current pain.  Per EMS patient awake, alert and oriented, walking and talking. No witnessed vomiting.   Past Medical History  Diagnosis Date  . Mental disorder   . Depression   . Diabetes mellitus without complication (HCC)   . Hypertension    Past Surgical History  Procedure Laterality Date  . Cesarean section     Family History  Problem Relation Age of Onset  . Depression Mother    Social History  Substance Use Topics  . Smoking status: Never Smoker   . Smokeless tobacco: Current User  . Alcohol Use: Yes     Comment: daily   OB History    No data available     Review of Systems  Constitutional: Positive for fatigue.  Gastrointestinal: Positive for vomiting.  Musculoskeletal: Negative for myalgias and  arthralgias.  Psychiatric/Behavioral: Positive for sleep disturbance.  All other systems reviewed and are negative.  Allergies  Review of patient's allergies indicates no known allergies.  Home Medications   Prior to Admission medications   Medication Sig Start Date End Date Taking? Authorizing Provider  ARIPiprazole (ABILIFY) 20 MG tablet Take 1 tablet (20 mg total) by mouth daily. 08/22/15  Yes Shuvon B Rankin, NP  gabapentin (NEURONTIN) 300 MG capsule Take 3 capsules (900 mg total) by mouth 4 (four) times daily - after meals and at bedtime. 08/22/15  Yes Shuvon B Rankin, NP  hydrOXYzine (ATARAX/VISTARIL) 50 MG tablet Take 1 tablet (50 mg total) by mouth every 6 (six) hours as needed for anxiety. 08/22/15  Yes Shuvon B Rankin, NP  insulin aspart (NOVOLOG) 100 UNIT/ML injection Inject 15 Units into the skin 3 (three) times daily with meals. 08/17/15  Yes Adonis Brook, NP  insulin glargine (LANTUS) 100 UNIT/ML injection Inject 0.6 mLs (60 Units total) into the skin 2 (two) times daily. 08/17/15  Yes Adonis Brook, NP  lisinopril (PRINIVIL,ZESTRIL) 10 MG tablet Take 10 mg by mouth daily.   Yes Historical Provider, MD  lithium carbonate (LITHOBID) 300 MG CR tablet Take 1 tablet (300 mg total) by mouth every 12 (twelve) hours. 08/22/15  Yes Shuvon B Rankin, NP  metFORMIN (GLUCOPHAGE) 1000 MG tablet Take 1 tablet (1,000 mg total) by mouth 2 (two) times daily with a meal. 08/17/15  Yes Adonis Brook, NP  mirtazapine (REMERON) 45 MG tablet Take  1 tablet (45 mg total) by mouth at bedtime. 08/17/15  Yes Adonis Brook, NP  Multiple Vitamin (MULTIVITAMIN WITH MINERALS) TABS tablet Take 1 tablet by mouth daily.   Yes Historical Provider, MD  simvastatin (ZOCOR) 20 MG tablet Take 1 tablet (20 mg total) by mouth daily at 6 PM. 08/17/15  Yes Adonis Brook, NP  Thiamine Mononitrate (VITAMIN B1 PO) Take 1 tablet by mouth daily.   Yes Historical Provider, MD  traMADol (ULTRAM) 50 MG tablet Take 50 mg by mouth 4 (four)  times daily.   Yes Historical Provider, MD  cyclobenzaprine (FLEXERIL) 5 MG tablet Take 1 tablet (5 mg total) by mouth 2 (two) times daily as needed for muscle spasms. Patient not taking: Reported on 09/21/2015 08/22/15   Shuvon B Rankin, NP  fluticasone (FLONASE) 50 MCG/ACT nasal spray Place 1 spray into both nostrils daily as needed for allergies or rhinitis. Patient not taking: Reported on 09/21/2015 08/17/15   Adonis Brook, NP   BP 110/76 mmHg  Pulse 108  Temp(Src) 98.3 F (36.8 C) (Oral)  Resp 16  SpO2 96% Physical Exam  Constitutional: She appears well-developed and well-nourished. She appears lethargic. No distress.  Pt is drowsy, but can be aroused by touch and loud voice  HENT:  Head: Normocephalic and atraumatic.  Mouth/Throat: Oropharynx is clear and moist. No oropharyngeal exudate.  Eyes: Conjunctivae and EOM are normal. Pupils are equal, round, and reactive to light. Right eye exhibits no discharge. Left eye exhibits no discharge. No scleral icterus.  Neck: Normal range of motion. Neck supple. No JVD present. No thyromegaly present.  Cardiovascular: Normal rate, regular rhythm, normal heart sounds and intact distal pulses.  Exam reveals no gallop and no friction rub.   No murmur heard. Pulmonary/Chest: Effort normal and breath sounds normal. No respiratory distress. She has no wheezes. She has no rales.  Abdominal: Soft. Bowel sounds are normal. She exhibits no distension and no mass. There is no tenderness.  Musculoskeletal: Normal range of motion. She exhibits no edema or tenderness.  Lymphadenopathy:    She has no cervical adenopathy.  Neurological: She appears lethargic. Coordination normal.  Skin: Skin is warm and dry. No rash noted. No erythema.  Psychiatric: She has a normal mood and affect. Her behavior is normal.  Nursing note and vitals reviewed.  ED Course  Procedures  DIAGNOSTIC STUDIES:  Oxygen Saturation is 97% on RA, normal by my interpretation.     COORDINATION OF CARE:  1:03 AM Discussed treatment plan with pt at bedside and pt agreed to plan.  Labs Review Labs Reviewed  CBC WITH DIFFERENTIAL/PLATELET - Abnormal; Notable for the following:    WBC 12.3 (*)    Hemoglobin 11.4 (*)    HCT 35.1 (*)    MCH 25.3 (*)    RDW 16.9 (*)    All other components within normal limits  COMPREHENSIVE METABOLIC PANEL - Abnormal; Notable for the following:    Glucose, Bld 430 (*)    AST 59 (*)    ALT 105 (*)    Total Bilirubin 0.2 (*)    All other components within normal limits  LITHIUM LEVEL - Abnormal; Notable for the following:    Lithium Lvl 0.38 (*)    All other components within normal limits  ETHANOL - Abnormal; Notable for the following:    Alcohol, Ethyl (B) 138 (*)    All other components within normal limits  CBG MONITORING, ED - Abnormal; Notable for the following:  Glucose-Capillary 413 (*)    All other components within normal limits  CBG MONITORING, ED - Abnormal; Notable for the following:    Glucose-Capillary 368 (*)    All other components within normal limits  KETONES, URINE  I-STAT BETA HCG BLOOD, ED ([facility], [facility], [facility] ONLY)    Imaging Review No results found. I have personally reviewed and evaluated these images and lab results as part of my medical decision-making.   EKG Interpretation None      MDM   Final diagnoses:  Hyperglycemia  Alcohol intoxication, uncomplicated (HCC)   2:11 am Patient is hyperglycemic but without gap. There was concern that she become lethargic and was not responding per RN, they pushed Narcan and it did not have an effect on the patient. She denies pain and has not had any vomiting here in the ED. Her ETOH level is 138. Will monitor and hydrate patient. Will PO challenge and ambulate once sober.  3: 45 am Patient is awake and wanting to leave. Requesting her things. Will PO challenge and ambulate. Her glucose is still 382 but she says her sugar typically runs in the 400's - she  does not have a gap.  The patient is awake and able to walk to the bathroom, she ate crackers and drink. I spoke with her about not drinking alcohol while taking Lithium, Celexa, Benadryl and the other medications. She denied drinking alcohol originally but then did admit to drinking. She denies Si/Hi or trying to harm herself.  Patient states that she wants her things back and is ready to go  I personally performed the services described in this documentation, which was scribed in my presence. The recorded information has been reviewed and is accurate.    Marlon Peliffany Madisen Ludvigsen, PA-C 09/21/15 2013  Shon Batonourtney F Horton, MD 09/21/15 2300

## 2015-09-24 ENCOUNTER — Emergency Department (HOSPITAL_COMMUNITY): Payer: Self-pay

## 2015-09-24 ENCOUNTER — Encounter (HOSPITAL_COMMUNITY): Payer: Self-pay | Admitting: *Deleted

## 2015-09-24 ENCOUNTER — Inpatient Hospital Stay (HOSPITAL_COMMUNITY)
Admission: EM | Admit: 2015-09-24 | Discharge: 2015-10-02 | DRG: 638 | Disposition: A | Discharge status: Home / Self Care | Payer: Federal, State, Local not specified - Other | Attending: Internal Medicine | Admitting: Internal Medicine

## 2015-09-24 DIAGNOSIS — R740 Nonspecific elevation of levels of transaminase and lactic acid dehydrogenase [LDH]: Secondary | ICD-10-CM

## 2015-09-24 DIAGNOSIS — Z794 Long term (current) use of insulin: Secondary | ICD-10-CM

## 2015-09-24 DIAGNOSIS — E876 Hypokalemia: Secondary | ICD-10-CM | POA: Diagnosis present

## 2015-09-24 DIAGNOSIS — K3184 Gastroparesis: Secondary | ICD-10-CM | POA: Diagnosis present

## 2015-09-24 DIAGNOSIS — I10 Essential (primary) hypertension: Secondary | ICD-10-CM | POA: Diagnosis present

## 2015-09-24 DIAGNOSIS — Z9114 Patient's other noncompliance with medication regimen: Secondary | ICD-10-CM

## 2015-09-24 DIAGNOSIS — F333 Major depressive disorder, recurrent, severe with psychotic symptoms: Secondary | ICD-10-CM | POA: Diagnosis present

## 2015-09-24 DIAGNOSIS — R109 Unspecified abdominal pain: Secondary | ICD-10-CM | POA: Insufficient documentation

## 2015-09-24 DIAGNOSIS — D72829 Elevated white blood cell count, unspecified: Secondary | ICD-10-CM | POA: Diagnosis present

## 2015-09-24 DIAGNOSIS — IMO0001 Reserved for inherently not codable concepts without codable children: Secondary | ICD-10-CM

## 2015-09-24 DIAGNOSIS — E1149 Type 2 diabetes mellitus with other diabetic neurological complication: Secondary | ICD-10-CM

## 2015-09-24 DIAGNOSIS — K219 Gastro-esophageal reflux disease without esophagitis: Secondary | ICD-10-CM | POA: Diagnosis present

## 2015-09-24 DIAGNOSIS — E1165 Type 2 diabetes mellitus with hyperglycemia: Secondary | ICD-10-CM

## 2015-09-24 DIAGNOSIS — R7401 Elevation of levels of liver transaminase levels: Secondary | ICD-10-CM

## 2015-09-24 DIAGNOSIS — E1110 Type 2 diabetes mellitus with ketoacidosis without coma: Secondary | ICD-10-CM | POA: Diagnosis present

## 2015-09-24 DIAGNOSIS — IMO0002 Reserved for concepts with insufficient information to code with codable children: Secondary | ICD-10-CM

## 2015-09-24 DIAGNOSIS — E1143 Type 2 diabetes mellitus with diabetic autonomic (poly)neuropathy: Secondary | ICD-10-CM | POA: Diagnosis present

## 2015-09-24 DIAGNOSIS — E1310 Other specified diabetes mellitus with ketoacidosis without coma: Principal | ICD-10-CM | POA: Diagnosis present

## 2015-09-24 DIAGNOSIS — E119 Type 2 diabetes mellitus without complications: Secondary | ICD-10-CM

## 2015-09-24 HISTORY — DX: Patient's noncompliance with other medical treatment and regimen: Z91.19

## 2015-09-24 HISTORY — DX: Patient's noncompliance with other medical treatment and regimen due to unspecified reason: Z91.199

## 2015-09-24 LAB — HEPATIC FUNCTION PANEL
ALT: 129 U/L — ABNORMAL HIGH (ref 14–54)
AST: 42 U/L — ABNORMAL HIGH (ref 15–41)
Albumin: 4.7 g/dL (ref 3.5–5.0)
Alkaline Phosphatase: 193 U/L — ABNORMAL HIGH (ref 38–126)
Bilirubin, Direct: 0.2 mg/dL (ref 0.1–0.5)
Indirect Bilirubin: 1.5 mg/dL — ABNORMAL HIGH (ref 0.3–0.9)
Total Bilirubin: 1.7 mg/dL — ABNORMAL HIGH (ref 0.3–1.2)
Total Protein: 8.8 g/dL — ABNORMAL HIGH (ref 6.5–8.1)

## 2015-09-24 LAB — CBC
HCT: 39.8 % (ref 36.0–46.0)
HCT: 40.2 % (ref 36.0–46.0)
Hemoglobin: 12.5 g/dL (ref 12.0–15.0)
Hemoglobin: 12.4 g/dL (ref 12.0–15.0)
MCH: 25.7 pg — ABNORMAL LOW (ref 26.0–34.0)
MCH: 25.9 pg — ABNORMAL LOW (ref 26.0–34.0)
MCHC: 31.4 g/dL (ref 30.0–36.0)
MCHC: 30.8 g/dL (ref 30.0–36.0)
MCV: 81.9 fL (ref 78.0–100.0)
MCV: 83.9 fL (ref 78.0–100.0)
Platelets: 431 10*3/uL — ABNORMAL HIGH (ref 150–400)
Platelets: 359 10*3/uL (ref 150–400)
RBC: 4.86 MIL/uL (ref 3.87–5.11)
RBC: 4.79 MIL/uL (ref 3.87–5.11)
RDW: 17.3 % — ABNORMAL HIGH (ref 11.5–15.5)
RDW: 17.3 % — ABNORMAL HIGH (ref 11.5–15.5)
WBC: 21.7 10*3/uL — ABNORMAL HIGH (ref 4.0–10.5)
WBC: 28.7 10*3/uL — ABNORMAL HIGH (ref 4.0–10.5)

## 2015-09-24 LAB — URINALYSIS, ROUTINE W REFLEX MICROSCOPIC
Bilirubin Urine: NEGATIVE
Glucose, UA: >1000 mg/dL — AB
Ketones, ur: >80 mg/dL — AB
Leukocytes, UA: NEGATIVE
Nitrite: NEGATIVE
Protein, ur: 30 mg/dL — AB
Specific Gravity, Urine: 1.026 (ref 1.005–1.030)
pH: 5 (ref 5.0–8.0)

## 2015-09-24 LAB — BLOOD GAS, VENOUS
Acid-base deficit: 22.5 mmol/L — ABNORMAL HIGH (ref 0.0–2.0)
Bicarbonate: 8.5 mEq/L — ABNORMAL LOW (ref 20.0–24.0)
O2 Saturation: 38.7 %
Patient temperature: 98.6
TCO2: 8.8 mmol/L (ref 0–100)
pCO2, Ven: 37.4 mmHg — ABNORMAL LOW (ref 45.0–50.0)
pH, Ven: 6.987 — CL (ref 7.250–7.300)
pO2, Ven: 34.7 mmHg (ref 31.0–45.0)

## 2015-09-24 LAB — BASIC METABOLIC PANEL
Anion gap: 28 — ABNORMAL HIGH (ref 5–15)
BUN: 16 mg/dL (ref 6–20)
BUN: 17 mg/dL (ref 6–20)
CO2: 8 mmol/L — ABNORMAL LOW (ref 22–32)
CO2: <7 mmol/L — ABNORMAL LOW (ref 22–32)
Calcium: 9.7 mg/dL (ref 8.9–10.3)
Calcium: 8.3 mg/dL — ABNORMAL LOW (ref 8.9–10.3)
Chloride: 100 mmol/L — ABNORMAL LOW (ref 101–111)
Chloride: 110 mmol/L (ref 101–111)
Creatinine, Ser: 1.1 mg/dL — ABNORMAL HIGH (ref 0.44–1.00)
Creatinine, Ser: 1.48 mg/dL — ABNORMAL HIGH (ref 0.44–1.00)
GFR calc Af Amer: >60 mL/min (ref >60)
GFR calc Af Amer: 51 mL/min — ABNORMAL LOW (ref >60)
GFR calc non Af Amer: >60 mL/min (ref >60)
GFR calc non Af Amer: 44 mL/min — ABNORMAL LOW (ref >60)
Glucose, Bld: 498 mg/dL — ABNORMAL HIGH (ref 65–99)
Glucose, Bld: 291 mg/dL — ABNORMAL HIGH (ref 65–99)
Potassium: 4.3 mmol/L (ref 3.5–5.1)
Potassium: 5.1 mmol/L (ref 3.5–5.1)
Sodium: 136 mmol/L (ref 135–145)
Sodium: 139 mmol/L (ref 135–145)

## 2015-09-24 LAB — CBG MONITORING, ED
Glucose-Capillary: 446 mg/dL — ABNORMAL HIGH (ref 65–99)
Glucose-Capillary: 467 mg/dL — ABNORMAL HIGH (ref 65–99)
Glucose-Capillary: 429 mg/dL — ABNORMAL HIGH (ref 65–99)
Glucose-Capillary: 355 mg/dL — ABNORMAL HIGH (ref 65–99)
Glucose-Capillary: 231 mg/dL — ABNORMAL HIGH (ref 65–99)

## 2015-09-24 LAB — URINE MICROSCOPIC-ADD ON: Bacteria, UA: NONE SEEN

## 2015-09-24 LAB — I-STAT BETA HCG BLOOD, ED (MC, WL, AP ONLY): I-stat hCG, quantitative: <5 m[IU]/mL (ref <5)

## 2015-09-24 LAB — MAGNESIUM: Magnesium: 2.1 mg/dL (ref 1.7–2.4)

## 2015-09-24 LAB — LIPASE, BLOOD: Lipase: 12 U/L (ref 11–51)

## 2015-09-24 MED ORDER — ONDANSETRON HCL 4 MG/2ML IJ SOLN
4.0000 mg | Freq: Four times a day (QID) | INTRAMUSCULAR | Status: DC | PRN
  Administered 2015-09-25 – 2015-09-30 (×12): 4 mg via INTRAVENOUS
  Filled 2015-09-24 (×13): qty 2

## 2015-09-24 MED ORDER — LITHIUM CARBONATE ER 300 MG PO TBCR
300.0000 mg | EXTENDED_RELEASE_TABLET | Freq: Two times a day (BID) | ORAL | Status: DC
  Administered 2015-09-25 – 2015-10-02 (×4): 300 mg via ORAL
  Filled 2015-09-24 (×18): qty 1

## 2015-09-24 MED ORDER — SODIUM CHLORIDE 0.9% FLUSH
3.0000 mL | Freq: Two times a day (BID) | INTRAVENOUS | Status: DC
  Administered 2015-09-25 – 2015-10-01 (×6): 3 mL via INTRAVENOUS

## 2015-09-24 MED ORDER — LISINOPRIL 10 MG PO TABS
10.0000 mg | ORAL_TABLET | Freq: Every day | ORAL | Status: DC
  Administered 2015-09-25 – 2015-09-27 (×3): 10 mg via ORAL
  Filled 2015-09-24 (×6): qty 1

## 2015-09-24 MED ORDER — SODIUM CHLORIDE 0.9 % IV SOLN
INTRAVENOUS | Status: DC
  Administered 2015-09-24: 4.1 [IU]/h via INTRAVENOUS
  Filled 2015-09-24: qty 2.5

## 2015-09-24 MED ORDER — SODIUM CHLORIDE 0.9 % IV SOLN
INTRAVENOUS | Status: DC
  Administered 2015-09-24: 5.1 [IU]/h via INTRAVENOUS
  Filled 2015-09-24 (×2): qty 2.5

## 2015-09-24 MED ORDER — METOCLOPRAMIDE HCL 5 MG/ML IJ SOLN
10.0000 mg | Freq: Once | INTRAMUSCULAR | Status: AC
  Administered 2015-09-24: 10 mg via INTRAVENOUS
  Filled 2015-09-24: qty 2

## 2015-09-24 MED ORDER — MIRTAZAPINE 45 MG PO TABS
45.0000 mg | ORAL_TABLET | Freq: Every day | ORAL | Status: DC
  Administered 2015-09-25: 45 mg via ORAL
  Filled 2015-09-24 (×3): qty 1
  Filled 2015-09-24: qty 2
  Filled 2015-09-24: qty 1

## 2015-09-24 MED ORDER — HYDROXYZINE HCL 25 MG PO TABS
50.0000 mg | ORAL_TABLET | Freq: Four times a day (QID) | ORAL | Status: DC | PRN
  Administered 2015-09-26 – 2015-10-01 (×3): 50 mg via ORAL
  Filled 2015-09-24 (×5): qty 2

## 2015-09-24 MED ORDER — SODIUM CHLORIDE 0.9 % IV BOLUS (SEPSIS)
1000.0000 mL | Freq: Once | INTRAVENOUS | Status: AC
  Administered 2015-09-24: 1000 mL via INTRAVENOUS

## 2015-09-24 MED ORDER — POTASSIUM CHLORIDE 10 MEQ/100ML IV SOLN: 10.0000 meq | INTRAVENOUS | Status: AC

## 2015-09-24 MED ORDER — SODIUM CHLORIDE 0.9 % IV SOLN: INTRAVENOUS | Status: AC

## 2015-09-24 MED ORDER — SODIUM CHLORIDE 0.9 % IV SOLN
INTRAVENOUS | Status: DC
  Administered 2015-09-24: 20:00:00 via INTRAVENOUS

## 2015-09-24 MED ORDER — DEXTROSE-NACL 5-0.45 % IV SOLN: INTRAVENOUS | Status: DC

## 2015-09-24 MED ORDER — ENOXAPARIN SODIUM 40 MG/0.4ML ~~LOC~~ SOLN
40.0000 mg | SUBCUTANEOUS | Status: DC
  Administered 2015-09-25 – 2015-10-01 (×5): 40 mg via SUBCUTANEOUS
  Filled 2015-09-24 (×10): qty 0.4

## 2015-09-24 MED ORDER — DEXTROSE-NACL 5-0.45 % IV SOLN
INTRAVENOUS | Status: DC
  Administered 2015-09-25 (×3): via INTRAVENOUS

## 2015-09-24 MED ORDER — SODIUM CHLORIDE 0.9 % IV SOLN: INTRAVENOUS | Status: DC

## 2015-09-24 MED ORDER — SIMVASTATIN 20 MG PO TABS
20.0000 mg | ORAL_TABLET | Freq: Every day | ORAL | Status: DC
  Administered 2015-09-25 – 2015-09-30 (×2): 20 mg via ORAL
  Filled 2015-09-24 (×9): qty 1

## 2015-09-24 MED ORDER — FLUTICASONE PROPIONATE 50 MCG/ACT NA SUSP
1.0000 | Freq: Every day | NASAL | Status: DC | PRN
  Filled 2015-09-24: qty 16

## 2015-09-24 MED ORDER — ARIPIPRAZOLE 10 MG PO TABS
20.0000 mg | ORAL_TABLET | Freq: Every day | ORAL | Status: DC
  Administered 2015-09-25 – 2015-10-02 (×4): 20 mg via ORAL
  Filled 2015-09-24 (×9): qty 2

## 2015-09-24 NOTE — Progress Notes (Signed)
At approximately 1600- RT notified RN in PT room that VBG sample was inadequate (attempted to analyze x 2 with no results). Ask RN to ensure sample is in heprin syringe and to notify RT when redrawn.

## 2015-09-24 NOTE — ED Notes (Signed)
US at bedside

## 2015-09-24 NOTE — ED Provider Notes (Signed)
CSN: [id number]     Arrival date & time 09/24/15  1342 History   First MD Initiated Contact with Patient 09/24/15 1435     Chief Complaint  Patient presents with  . Hyperglycemia  . Emesis     (Consider location/radiation/quality/duration/timing/severity/associated sxs/prior Treatment) HPI 40 year old female who presents with nausea, vomiting, and hyperglycemia. History of diabetes, hypertension, and depression. States compliance with insulin but has been having increased thirst and urinary frequency recently with elevated blood sugars. States that this morning had intractable nausea and vomiting. No diarrhea or abdominal pain. Does complain of some shortness of breath and cough recently without having fevers or chills. Denies any chest pain.   Past Medical History  Diagnosis Date  . Mental disorder   . Depression   . Diabetes mellitus without complication (HCC)   . Hypertension    Past Surgical History  Procedure Laterality Date  . Cesarean section     Family History  Problem Relation Age of Onset  . Depression Mother    Social History  Substance Use Topics  . Smoking status: Never Smoker   . Smokeless tobacco: Current User  . Alcohol Use: Yes     Comment: daily   OB History    No data available     Review of Systems 10/14 systems reviewed and are negative other than those stated in the HPI    Allergies  Review of patient's allergies indicates no known allergies.  Home Medications   Prior to Admission medications   Medication Sig Start Date End Date Taking? Authorizing Provider  ARIPiprazole (ABILIFY) 20 MG tablet Take 1 tablet (20 mg total) by mouth daily. 08/22/15   Shuvon B Rankin, NP  cyclobenzaprine (FLEXERIL) 5 MG tablet Take 1 tablet (5 mg total) by mouth 2 (two) times daily as needed for muscle spasms. Patient not taking: Reported on 09/21/2015 08/22/15   Shuvon B Rankin, NP  fluticasone (FLONASE) 50 MCG/ACT nasal spray Place 1 spray into both nostrils  daily as needed for allergies or rhinitis. Patient not taking: Reported on 09/21/2015 08/17/15   Adonis Brook, NP  gabapentin (NEURONTIN) 300 MG capsule Take 3 capsules (900 mg total) by mouth 4 (four) times daily - after meals and at bedtime. 08/22/15   Shuvon B Rankin, NP  hydrOXYzine (ATARAX/VISTARIL) 50 MG tablet Take 1 tablet (50 mg total) by mouth every 6 (six) hours as needed for anxiety. 08/22/15   Shuvon B Rankin, NP  insulin aspart (NOVOLOG) 100 UNIT/ML injection Inject 15 Units into the skin 3 (three) times daily with meals. 08/17/15   Adonis Brook, NP  insulin glargine (LANTUS) 100 UNIT/ML injection Inject 0.6 mLs (60 Units total) into the skin 2 (two) times daily. 08/17/15   Adonis Brook, NP  lisinopril (PRINIVIL,ZESTRIL) 10 MG tablet Take 10 mg by mouth daily.    Historical Provider, MD  lithium carbonate (LITHOBID) 300 MG CR tablet Take 1 tablet (300 mg total) by mouth every 12 (twelve) hours. 08/22/15   Shuvon B Rankin, NP  metFORMIN (GLUCOPHAGE) 1000 MG tablet Take 1 tablet (1,000 mg total) by mouth 2 (two) times daily with a meal. 08/17/15   Adonis Brook, NP  mirtazapine (REMERON) 45 MG tablet Take 1 tablet (45 mg total) by mouth at bedtime. 08/17/15   Adonis Brook, NP  Multiple Vitamin (MULTIVITAMIN WITH MINERALS) TABS tablet Take 1 tablet by mouth daily.    Historical Provider, MD  simvastatin (ZOCOR) 20 MG tablet Take 1 tablet (20 mg total) by mouth daily  at 6 PM. 08/17/15   Adonis Brook, NP  Thiamine Mononitrate (VITAMIN B1 PO) Take 1 tablet by mouth daily.    Historical Provider, MD  traMADol (ULTRAM) 50 MG tablet Take 50 mg by mouth 4 (four) times daily.    Historical Provider, MD   BP 136/89 mmHg  Pulse 119  Temp(Src) 97.6 F (36.4 C) (Oral)  Resp 18  SpO2 100%  LMP 09/24/2015 Physical Exam Physical Exam  Nursing note and vitals reviewed. Constitutional: low energy but non-toxic, and in no acute distress Head: Normocephalic and atraumatic.  Mouth/Throat: Oropharynx is  clear and dry mucous membranes.  Neck: Normal range of motion. Neck supple.  Cardiovascular: Tachycardic rate and regular rhythm.  no edema Pulmonary/Chest: Effort normal and breath sounds normal.  Abdominal: Soft. There is no tenderness. There is no rebound and no guarding.  Musculoskeletal: Normal range of motion.  Neurological: Alert, no facial droop, fluent speech, moves all extremities symmetrically Skin: Skin is warm and dry.  Psychiatric: Cooperative  ED Course  Procedures (including critical care time) Labs Review Labs Reviewed  BASIC METABOLIC PANEL - Abnormal; Notable for the following:    Chloride 100 (*)    CO2 8 (*)    Glucose, Bld 498 (*)    Creatinine, Ser 1.10 (*)    Anion gap 28 (*)    All other components within normal limits  CBC - Abnormal; Notable for the following:    WBC 21.7 (*)    MCH 25.7 (*)    RDW 17.3 (*)    Platelets 431 (*)    All other components within normal limits  URINALYSIS, ROUTINE W REFLEX MICROSCOPIC (NOT AT [facility]) - Abnormal; Notable for the following:    Glucose, UA >1000 (*)    Hgb urine dipstick TRACE (*)    Ketones, ur >80 (*)    Protein, ur 30 (*)    All other components within normal limits  URINE MICROSCOPIC-ADD ON - Abnormal; Notable for the following:    Squamous Epithelial / LPF 0-5 (*)    All other components within normal limits  CBG MONITORING, ED - Abnormal; Notable for the following:    Glucose-Capillary 446 (*)    All other components within normal limits  MAGNESIUM  LIPASE, BLOOD  HEPATIC FUNCTION PANEL  BLOOD GAS, VENOUS  I-STAT BETA HCG BLOOD, ED ([facility], [facility], [facility] ONLY)    Imaging Review No results found. I have personally reviewed and evaluated these images and lab results as part of my medical decision-making.   EKG Interpretation   Date/Time:  Thursday September 24 2015 15:14:54 EDT Ventricular Rate:  127 PR Interval:  79 QRS Duration: 99 QT Interval:  358 QTC Calculation: 520 R Axis:   84 Text  Interpretation:  Sinus tachycardia Atrial premature complex  Borderline repolarization abnormality Prolonged QT interval aside from  tachycardia and prolonged QTc not significantly changed  Confirmed by Yania Bogie  MD, Carylon Tamburro [phone]) on 09/24/2015 3:55:44 PM     CRITICAL CARE Performed by: Lavera Guise   Total critical care time: 35 minutes  Critical care time was exclusive of separately billable procedures and treating other patients.  Critical care was necessary to treat or prevent imminent or life-threatening deterioration.  Critical care was time spent personally by me on the following activities: development of treatment plan with patient and/or surrogate as well as nursing, discussions with consultants, evaluation of patient's response to treatment, examination of patient, obtaining history from patient or surrogate, ordering and performing treatments  and interventions, ordering and review of laboratory studies, ordering and review of radiographic studies, pulse oximetry and re-evaluation of patient's condition.  MDM   Final diagnoses:  Diabetic ketoacidosis without coma associated with type 2 diabetes mellitus (HCC)    40 year old female with history of diabetes who presents with hyperglycemia and vomiting. Is tachycardic with a heart rate in the 120s on arrival, normotensive, and clinically dry on exam. Afebrile. She has a soft and benign abdomen unremarkable cardiopulmonary exam. Concern for DKA as she has hyperglycemia with glucose in the 400s. She has an anion gap of 28 and a bicarbonate of 8. UA negative for infection. CXR pending for evaluation of PNA in setting of cough. Received 2 L of IV fluids, and subsequently started on insulin drip. Will be admitted to stepdown for ongoing management.    Lavera Guiseana Duo Shamonique Battiste, MD 09/24/15 [phone]

## 2015-09-24 NOTE — H&P (Signed)
[facility] History and Physical  Mirabella Hilario RUE:[id number] DOB: [birth year] DOA: 09/24/2015  Referring physician:  PCP: No primary care provider on file.   Chief Complaint: Nausea/vomiting  HPI: Carolyn Ayala is a 40 y.o. female with a past medical history of insulin-dependent diabetes mellitus presented to the emergency department with complaints of nausea and vomiting. In the past 24 hours she reports developing multiple episodes of nausea and vomiting (approximately 8 episodes this morning) unable to keep by mouth down associated with feeling ill, having polyuria and polydipsia. She also reports "my heart was racing." She states being on insulin for her diabetes and has not missed any doses. She tells me that her blood sugars had been under control lately, however, in the emergency department she was found to have a blood sugar of 498 with an anion gap of 28, bicarbonate of 8, with presence of ketones on urinalysis. Furthermore, she was recently seen in the emergency department on 09/21/2015 for hyperglycemia having an alcohol level of 138. In the emergency room but today she was started on IV fluids and IV insulin.                                    Review of Systems:  Constitutional:  No weight loss, night sweats, Fevers, chills, positive for fatigue, malaise HEENT:  No headaches, Difficulty swallowing,Tooth/dental problems,Sore throat,  No sneezing, itching, ear ache, nasal congestion, post nasal drip,  Cardio-vascular:  No chest pain, Orthopnea, PND, swelling in lower extremities, anasarca, dizziness, palpitations  GI:  No heartburn, indigestion, abdominal pain, positive for nausea, vomiting, denies diarrhea, change in bowel habits, loss of appetite  Resp:  No shortness of breath with exertion or at rest. No excess mucus, no productive cough, No non-productive cough, No coughing up of blood.No change in color of mucus.No wheezing.No chest wall deformity    Skin:  no rash or lesions.  GU:  no dysuria, change in color of urine, no urgency or frequency. No flank pain.  Musculoskeletal:  No joint pain or swelling. No decreased range of motion. No back pain.  Psych:  No change in mood or affect. No depression or anxiety. No memory loss.   Past Medical History  Diagnosis Date  . Mental disorder   . Depression   . Diabetes mellitus without complication (HCC)   . Hypertension    Past Surgical History  Procedure Laterality Date  . Cesarean section     Social History:  reports that she has never smoked. She uses smokeless tobacco. She reports that she drinks alcohol. She reports that she does not use illicit drugs.  No Known Allergies  Family History  Problem Relation Age of Onset  . Depression Mother     Prior to Admission medications   Medication Sig Start Date End Date Taking? Authorizing Provider  gabapentin (NEURONTIN) 300 MG capsule Take 3 capsules (900 mg total) by mouth 4 (four) times daily - after meals and at bedtime. 08/22/15  Yes Shuvon B Rankin, NP  hydrOXYzine (ATARAX/VISTARIL) 50 MG tablet Take 1 tablet (50 mg total) by mouth every 6 (six) hours as needed for anxiety. 08/22/15  Yes Shuvon B Rankin, NP  insulin aspart (NOVOLOG) 100 UNIT/ML injection Inject 15 Units into the skin 3 (three) times daily with meals. 08/17/15  Yes Adonis Brook, NP  insulin glargine (LANTUS) 100 UNIT/ML injection Inject 0.6 mLs (60 Units total) into the  skin 2 (two) times daily. Patient taking differently: Inject 55 Units into the skin at bedtime.  08/17/15  Yes Adonis BrookSheila Agustin, NP  lisinopril (PRINIVIL,ZESTRIL) 10 MG tablet Take 10 mg by mouth daily.   Yes Historical Provider, MD  mirtazapine (REMERON) 45 MG tablet Take 1 tablet (45 mg total) by mouth at bedtime. 08/17/15  Yes Adonis BrookSheila Agustin, NP  Multiple Vitamin (MULTIVITAMIN WITH MINERALS) TABS tablet Take 1 tablet by mouth daily.   Yes Historical Provider, MD  simvastatin (ZOCOR) 20 MG tablet Take 1  tablet (20 mg total) by mouth daily at 6 PM. 08/17/15  Yes Adonis BrookSheila Agustin, NP  Thiamine Mononitrate (VITAMIN B1 PO) Take 1 tablet by mouth daily.   Yes Historical Provider, MD  traMADol (ULTRAM) 50 MG tablet Take 50 mg by mouth 4 (four) times daily.   Yes Historical Provider, MD  fluticasone (FLONASE) 50 MCG/ACT nasal spray Place 1 spray into both nostrils daily as needed for allergies or rhinitis. Patient not taking: Reported on 09/21/2015 08/17/15   Adonis BrookSheila Agustin, NP  lithium carbonate (LITHOBID) 300 MG CR tablet Take 1 tablet (300 mg total) by mouth every 12 (twelve) hours. Patient not taking: Reported on 09/24/2015 08/22/15   Shuvon B Rankin, NP  metFORMIN (GLUCOPHAGE) 1000 MG tablet Take 1 tablet (1,000 mg total) by mouth 2 (two) times daily with a meal. Patient not taking: Reported on 09/24/2015 08/17/15   Adonis BrookSheila Agustin, NP   Physical Exam: Filed Vitals:   09/24/15 1358 09/24/15 1711 09/24/15 1800  BP: 136/89 151/92 151/98  Pulse: 119 128 128  Temp: 97.6 F (36.4 C) 97.6 F (36.4 C)   TempSrc: Oral Oral   Resp: 18 19 22   SpO2: 100% 100% 100%    Wt Readings from Last 3 Encounters:  08/05/15 89.359 kg (197 lb)  08/01/15 90.719 kg (200 lb)  07/03/15 89.359 kg (197 lb)    General:  Ill-appearing although no acute distress, she is awake and alert Eyes: PERRL, normal lids, irises & conjunctiva ENT: grossly normal hearing, lips & tongue Neck: no LAD, masses or thyromegaly Cardiovascular: Tachycardic, RRR, no m/r/g. No LE edema. Telemetry: SR, no arrhythmias, his tachycardia  Respiratory: CTA bilaterally, no w/r/r. Normal respiratory effort. Abdomen: soft, ntnd Skin: no rash or induration seen on limited exam Musculoskeletal: grossly normal tone BUE/BLE Psychiatric: grossly normal mood and affect, speech fluent and appropriate Neurologic: grossly non-focal.          Labs on Admission:  Basic Metabolic Panel:  Recent Labs Lab 09/21/15 0028 09/24/15 1421 09/24/15 1526  NA 138  136  --   K 4.2 4.3  --   CL 104 100*  --   CO2 22 8*  --   GLUCOSE 430* 498*  --   BUN 10 16  --   CREATININE 0.70 1.10*  --   CALCIUM 9.5 9.7  --   MG  --   --  2.1   Liver Function Tests:  Recent Labs Lab 09/21/15 0028 09/24/15 1526  AST 59* 42*  ALT 105* 129*  ALKPHOS 99 193*  BILITOT 0.2* 1.7*  PROT 7.2 8.8*  ALBUMIN 3.8 4.7    Recent Labs Lab 09/24/15 1526  LIPASE 12   No results for input(s): AMMONIA in the last 168 hours. CBC:  Recent Labs Lab 09/21/15 0028 09/24/15 1421  WBC 12.3* 21.7*  NEUTROABS 7.6  --   HGB 11.4* 12.5  HCT 35.1* 39.8  MCV 78.0 81.9  PLT 360 431*   Cardiac  Enzymes: No results for input(s): CKTOTAL, CKMB, CKMBINDEX, TROPONINI in the last 168 hours.  BNP (last 3 results) No results for input(s): BNP in the last 8760 hours.  ProBNP (last 3 results) No results for input(s): PROBNP in the last 8760 hours.  CBG:  Recent Labs Lab 09/20/15 2357 09/21/15 0336 09/24/15 1421  GLUCAP 413* 368* 446*    Radiological Exams on Admission: Dg Chest 2 View  09/24/2015  CLINICAL DATA:  Nausea.  Cough EXAM: CHEST  2 VIEW COMPARISON:  07/03/2015 FINDINGS: Normal heart size and mediastinal contours. No acute infiltrate or edema. No effusion or pneumothorax. No acute osseous findings. IMPRESSION: Normal chest Electronically Signed   By: Marnee Spring M.D.   On: 09/24/2015 16:35   US Abdomen Limited Ruq  09/24/2015  CLINICAL DATA:  Nausea for 1 day EXAM: US ABDOMEN LIMITED - RIGHT UPPER QUADRANT COMPARISON:  None. FINDINGS: Gallbladder: Mild gallbladder sludge. No stone. No wall thickening or focal tenderness. Common bile duct: Diameter: 3 mm Liver: No focal lesion identified. Within normal limits in parenchymal echogenicity. IMPRESSION: Small volume gallbladder sludge.  Otherwise negative. Electronically Signed   By: Marnee Spring M.D.   On: 09/24/2015 16:59    EKG: Independently reviewed.   Assessment/Plan Principal Problem:   DKA  (diabetic ketoacidoses) (HCC) Active Problems:   IDDM (insulin dependent diabetes mellitus) (HCC)   Uncontrolled diabetes mellitus with neurologic complication, with long-term current use of insulin (HCC)   HTN (hypertension)   1. Diabetic ketoacidosis. Mrs Dippolito is a 40 year old female with a past medical history of insulin-dependent diabetes mellitus presenting to the emergency department with complaints of polyuria, polydipsia, having a blood sugar of 498 and diabetic ketoacidosis. Although she reports having controlled blood sugars, she had a recent ED visits on 09/21/2015 at which time she was found to have a blood sugar of 430. Furthermore was found to have an alcohol level 138. I suspect DKA may have resulted from nonadherence to her insulin. Place her on DKA protocol, admitted to the stepdown unit, treat with IV insulin and aggressive IV fluid resuscitation, correct potassium, follow serial BMP's, keeping her NPO. Consult diabetic coordinator. 2. Leukocytosis. Labs show an elevated white count of 21,700 which I believe is related to diabetic ketoacidosis. Chest x-ray did not reveal acute cardiopulmonary disease. UA negative. She is afebrile on presentation. Monitor closely off of antimicrobial therapy. 3. Hypertension. Plan to continue lisinopril 10 mg by mouth daily, monitor blood pressures. 4. History of major depression with psychosis. She received inpatient psychiatric treatment in March 2017. We'll continue Abilify 20 mg daily and lithium 300 mg every 12 hours.   Code Status: Full code DVT Prophylaxis: Lovenox Family Communication: He now presents Disposition Plan: Admit to stepdown unit  Time spent: 70 min  Jeralyn Bennett [facility] Pager [phone]

## 2015-09-24 NOTE — Progress Notes (Signed)
Patient noted to have had 5 admissions within the last six months.  EDCM spoke to patient at bedside.  Patient with pmhx of diabetes.  Patient reports she has been taking her insulin.  [facility]?  [facility] after her mother passed away and they renewed her prescriptions then.  Patient then stated, "I'm really sick.  Can you come back another time?"  Patient listed as not having a pcp or insurance.  [facility] and patient reports she would be interested but patient reports she does not have a phone number to [facility] at [facility].  [facility] to obtain appointment for follow up and TCC.  [facility].  Patient appeared to have been vomiting (emises bag on table with contents).  EDCM asked unit clerk to ask EDRN if patient can have ice water.  No further EDCM needs at this time.

## 2015-09-24 NOTE — ED Notes (Signed)
Per EMS, pt complains of nausea, vomiting, hyperglycemia since this morning. Pt states CBG was 300, pt took 7 units of novolog this morning. Pt then felt nauseas and began vomiting, pt has vomited 8-9 times since ~8PM. EMS CBG 430. Pt denies abdominal pain. 130/84 BP, 122 HR. Pt received 300cc fluid, 4mg  zofran.

## 2015-09-25 LAB — CBG MONITORING, ED
Glucose-Capillary: 141 mg/dL — ABNORMAL HIGH (ref 65–99)
Glucose-Capillary: 149 mg/dL — ABNORMAL HIGH (ref 65–99)
Glucose-Capillary: 152 mg/dL — ABNORMAL HIGH (ref 65–99)
Glucose-Capillary: 153 mg/dL — ABNORMAL HIGH (ref 65–99)
Glucose-Capillary: 156 mg/dL — ABNORMAL HIGH (ref 65–99)
Glucose-Capillary: 161 mg/dL — ABNORMAL HIGH (ref 65–99)
Glucose-Capillary: 164 mg/dL — ABNORMAL HIGH (ref 65–99)
Glucose-Capillary: 167 mg/dL — ABNORMAL HIGH (ref 65–99)
Glucose-Capillary: 175 mg/dL — ABNORMAL HIGH (ref 65–99)
Glucose-Capillary: 184 mg/dL — ABNORMAL HIGH (ref 65–99)
Glucose-Capillary: 203 mg/dL — ABNORMAL HIGH (ref 65–99)
Glucose-Capillary: 210 mg/dL — ABNORMAL HIGH (ref 65–99)
Glucose-Capillary: 264 mg/dL — ABNORMAL HIGH (ref 65–99)

## 2015-09-25 LAB — BASIC METABOLIC PANEL
Anion gap: 20 — ABNORMAL HIGH (ref 5–15)
Anion gap: 18 — ABNORMAL HIGH (ref 5–15)
Anion gap: 11 (ref 5–15)
Anion gap: 13 (ref 5–15)
Anion gap: 11 (ref 5–15)
BUN: 14 mg/dL (ref 6–20)
BUN: 13 mg/dL (ref 6–20)
BUN: 10 mg/dL (ref 6–20)
BUN: 9 mg/dL (ref 6–20)
BUN: 7 mg/dL (ref 6–20)
CO2: 9 mmol/L — ABNORMAL LOW (ref 22–32)
CO2: 11 mmol/L — ABNORMAL LOW (ref 22–32)
CO2: 14 mmol/L — ABNORMAL LOW (ref 22–32)
CO2: 17 mmol/L — ABNORMAL LOW (ref 22–32)
CO2: 17 mmol/L — ABNORMAL LOW (ref 22–32)
Calcium: 8.3 mg/dL — ABNORMAL LOW (ref 8.9–10.3)
Calcium: 8.4 mg/dL — ABNORMAL LOW (ref 8.9–10.3)
Calcium: 8.4 mg/dL — ABNORMAL LOW (ref 8.9–10.3)
Calcium: 8.3 mg/dL — ABNORMAL LOW (ref 8.9–10.3)
Calcium: 8.3 mg/dL — ABNORMAL LOW (ref 8.9–10.3)
Chloride: 112 mmol/L — ABNORMAL HIGH (ref 101–111)
Chloride: 111 mmol/L (ref 101–111)
Chloride: 116 mmol/L — ABNORMAL HIGH (ref 101–111)
Chloride: 111 mmol/L (ref 101–111)
Chloride: 111 mmol/L (ref 101–111)
Creatinine, Ser: 1.22 mg/dL — ABNORMAL HIGH (ref 0.44–1.00)
Creatinine, Ser: 1.1 mg/dL — ABNORMAL HIGH (ref 0.44–1.00)
Creatinine, Ser: 0.83 mg/dL (ref 0.44–1.00)
Creatinine, Ser: 0.75 mg/dL (ref 0.44–1.00)
Creatinine, Ser: 0.71 mg/dL (ref 0.44–1.00)
GFR calc Af Amer: >60 mL/min (ref >60)
GFR calc Af Amer: >60 mL/min (ref >60)
GFR calc Af Amer: >60 mL/min (ref >60)
GFR calc Af Amer: >60 mL/min (ref >60)
GFR calc Af Amer: >60 mL/min (ref >60)
GFR calc non Af Amer: 55 mL/min — ABNORMAL LOW (ref >60)
GFR calc non Af Amer: >60 mL/min (ref >60)
GFR calc non Af Amer: >60 mL/min (ref >60)
GFR calc non Af Amer: >60 mL/min (ref >60)
GFR calc non Af Amer: >60 mL/min (ref >60)
Glucose, Bld: 197 mg/dL — ABNORMAL HIGH (ref 65–99)
Glucose, Bld: 175 mg/dL — ABNORMAL HIGH (ref 65–99)
Glucose, Bld: 167 mg/dL — ABNORMAL HIGH (ref 65–99)
Glucose, Bld: 136 mg/dL — ABNORMAL HIGH (ref 65–99)
Glucose, Bld: 192 mg/dL — ABNORMAL HIGH (ref 65–99)
Potassium: 4 mmol/L (ref 3.5–5.1)
Potassium: 4 mmol/L (ref 3.5–5.1)
Potassium: 3.3 mmol/L — ABNORMAL LOW (ref 3.5–5.1)
Potassium: 3.1 mmol/L — ABNORMAL LOW (ref 3.5–5.1)
Potassium: 2.9 mmol/L — ABNORMAL LOW (ref 3.5–5.1)
Sodium: 141 mmol/L (ref 135–145)
Sodium: 140 mmol/L (ref 135–145)
Sodium: 141 mmol/L (ref 135–145)
Sodium: 141 mmol/L (ref 135–145)
Sodium: 139 mmol/L (ref 135–145)

## 2015-09-25 LAB — GLUCOSE, CAPILLARY
Glucose-Capillary: 147 mg/dL — ABNORMAL HIGH (ref 65–99)
Glucose-Capillary: 132 mg/dL — ABNORMAL HIGH (ref 65–99)
Glucose-Capillary: 122 mg/dL — ABNORMAL HIGH (ref 65–99)
Glucose-Capillary: 147 mg/dL — ABNORMAL HIGH (ref 65–99)
Glucose-Capillary: 160 mg/dL — ABNORMAL HIGH (ref 65–99)
Glucose-Capillary: 179 mg/dL — ABNORMAL HIGH (ref 65–99)
Glucose-Capillary: 169 mg/dL — ABNORMAL HIGH (ref 65–99)

## 2015-09-25 LAB — MRSA PCR SCREENING: MRSA by PCR: POSITIVE — AB

## 2015-09-25 MED ORDER — PROMETHAZINE HCL 25 MG PO TABS
12.5000 mg | ORAL_TABLET | Freq: Four times a day (QID) | ORAL | Status: DC | PRN
Start: 2015-09-25 — End: 2015-10-02
  Administered 2015-09-25 – 2015-10-01 (×10): 12.5 mg via ORAL
  Filled 2015-09-25 (×10): qty 1

## 2015-09-25 MED ORDER — METOCLOPRAMIDE HCL 5 MG/ML IJ SOLN
10.0000 mg | Freq: Three times a day (TID) | INTRAMUSCULAR | Status: DC
  Administered 2015-09-25 – 2015-09-30 (×14): 10 mg via INTRAVENOUS
  Filled 2015-09-25 (×22): qty 2

## 2015-09-25 MED ORDER — POTASSIUM CHLORIDE 10 MEQ/100ML IV SOLN
10.0000 meq | INTRAVENOUS | Status: AC
  Administered 2015-09-25 – 2015-09-26 (×3): 10 meq via INTRAVENOUS
  Filled 2015-09-25 (×3): qty 100

## 2015-09-25 MED ORDER — IBUPROFEN 200 MG PO TABS: 400.0000 mg | ORAL_TABLET | Freq: Four times a day (QID) | ORAL | Status: DC | PRN

## 2015-09-25 NOTE — Progress Notes (Signed)
[facility] PROGRESS NOTE  Carolyn Carolyn Ayala ZOX:[id number] DOB: [birth year] DOA: 09/24/2015 PCP: No primary care provider on file.  Assessment/Plan: 1. Diabetic ketoacidosis -Mrs Carolyn Ayala is a 40 year old female with a history of insulin dependent diabetes mellitus, who reported taking  Lantus 55 units daily at bedtime and NovoLog 15 units 3 times a day with meals, resented with multiple episodes of nausea and vomiting. -She was found to be hyperglycemic with labs showing diabetic ketoacidosis. -On review of serial BMPs she remains in DKA with her last and iron gap of 18 and bicarbonate of 11 -Plan to continue IV insulin with IV fluids and electrolyte replacement as needed. Keep her nothing by mouth for now will follow lab work.   2.  Nausea/vomiting -Likely secondary to diabetic ketoacidosis -Overnight continued to have episodes of nausea/vomiting. Continue as needed IV anti-emetics therapy  3.  Leukocytosis. -Lab work showing a white count of 28,700 -I had suspected this was related to diabetic ketoacidosis. She has been afebrile in the emergency department. Urine chest x-ray did not reveal evidence of infection. Abdominal x-ray was negative. -Will monitor off of antimicrobial therapy  4. Hypertension. -This morning having blood pressure 130/77, we'll continue lisinopril 10 mg by mouth daily  5.  History of depression with psychotic features. -As previously required inpatient psychiatric treatment. -Continue lithium and Abilify  Code Status: Full code Family Communication:  Disposition Plan: Plan to admit to step down unit   HPI/Subjective: Mrs Carolyn Ayala is a 40 year old female with a history of insulin-dependent diabetes mellitus admitted to the medicine service on 09/24/2015 when she presented with complaints of nausea and vomiting, able to keep by mouth down. Lab work in the emergency department showed she was in diabetic ketoacidosis. She was started on the DKA  protocol treated with IV insulin and IV fluids.  Objective: Filed Vitals:   09/25/15 0530 09/25/15 0630  BP: 163/83 130/77  Pulse:    Temp:    Resp: 17 20   No intake or output data in the 24 hours ending 09/25/15 0823 There were no vitals filed for this visit.  Exam:   General:  Appears ill, reporting throwing off on night long  Cardiovascular: Tachycardic, regular rate rhythm normal S1-S2  Respiratory: Normal respiratory effort  Abdomen: Soft nontender nondistended  Musculoskeletal: No edema  Data Reviewed: Basic Metabolic Panel:  Recent Labs Lab 09/21/15 0028 09/24/15 1421 09/24/15 1526 09/24/15 2214 09/25/15 0239 09/25/15 0533  NA 138 136  --  139 141 140  K 4.2 4.3  --  5.1 4.0 4.0  CL 104 100*  --  110 112* 111  CO2 22 8*  --  <7* 9* 11*  GLUCOSE 430* 498*  --  291* 197* 175*  BUN 10 16  --  17 14 13   CREATININE 0.70 1.10*  --  1.48* 1.22* 1.10*  CALCIUM 9.5 9.7  --  8.3* 8.3* 8.4*  MG  --   --  2.1  --   --   --    Liver Function Tests:  Recent Labs Lab 09/21/15 0028 09/24/15 1526  AST 59* 42*  ALT 105* 129*  ALKPHOS 99 193*  BILITOT 0.2* 1.7*  PROT 7.2 8.8*  ALBUMIN 3.8 4.7    Recent Labs Lab 09/24/15 1526  LIPASE 12   No results for input(s): AMMONIA in the last 168 hours. CBC:  Recent Labs Lab 09/21/15 0028 09/24/15 1421 09/24/15 2214  WBC 12.3* 21.7* 28.7*  NEUTROABS 7.6  --   --  HGB 11.4* 12.5 12.4  HCT 35.1* 39.8 40.2  MCV 78.0 81.9 83.9  PLT 360 431* 359   Cardiac Enzymes: No results for input(s): CKTOTAL, CKMB, CKMBINDEX, TROPONINI in the last 168 hours. BNP (last 3 results) No results for input(s): BNP in the last 8760 hours.  ProBNP (last 3 results) No results for input(s): PROBNP in the last 8760 hours.  CBG:  Recent Labs Lab 09/25/15 0351 09/25/15 0500 09/25/15 0612 09/25/15 0727 09/25/15 0757  GLUCAP 175* 164* 167* 161* 156*    No results found for this or any previous visit (from the past 240  hour(s)).   Studies: Dg Chest 2 View  09/24/2015  CLINICAL DATA:  Nausea.  Cough EXAM: CHEST  2 VIEW COMPARISON:  07/03/2015 FINDINGS: Normal heart size and mediastinal contours. No acute infiltrate or edema. No effusion or pneumothorax. No acute osseous findings. IMPRESSION: Normal chest Electronically Signed   By: Marnee Spring M.D.   On: 09/24/2015 16:35   US Abdomen Limited Ruq  09/24/2015  CLINICAL DATA:  Nausea for 1 day EXAM: US ABDOMEN LIMITED - RIGHT UPPER QUADRANT COMPARISON:  None. FINDINGS: Gallbladder: Mild gallbladder sludge. No stone. No wall thickening or focal tenderness. Common bile duct: Diameter: 3 mm Liver: No focal lesion identified. Within normal limits in parenchymal echogenicity. IMPRESSION: Small volume gallbladder sludge.  Otherwise negative. Electronically Signed   By: Marnee Spring M.D.   On: 09/24/2015 16:59    Scheduled Meds: . ARIPiprazole  20 mg Oral Daily  . enoxaparin (LOVENOX) injection  40 mg Subcutaneous Q24H  . lisinopril  10 mg Oral Daily  . lithium carbonate  300 mg Oral Q12H  . mirtazapine  45 mg Oral QHS  . simvastatin  20 mg Oral q1800  . sodium chloride flush  3 mL Intravenous Q12H   Continuous Infusions: . sodium chloride Stopped (09/25/15 0246)  . sodium chloride    . dextrose 5 % and 0.45% NaCl    . dextrose 5 % and 0.45% NaCl 100 mL/hr at 09/25/15 0246  . insulin (NOVOLIN-R) infusion 6.1 Units/hr (09/25/15 0730)    Principal Problem:   DKA (diabetic ketoacidoses) (HCC) Active Problems:   IDDM (insulin dependent diabetes mellitus) (HCC)   Uncontrolled diabetes mellitus with neurologic complication, with long-term current use of insulin (HCC)   HTN (hypertension)    Time spent: 35 min    Jeralyn Bennett  [facility] Pager [phone]. If 7PM-7AM, please contact night-coverage at [email or web address], password [facility] 09/25/2015, 8:23 AM  LOS: 1 day

## 2015-09-25 NOTE — Progress Notes (Signed)
Pt still vomiting despite interventions and medications available. MD paged to request further instruction. RN to continue to monitor.

## 2015-09-25 NOTE — ED Notes (Signed)
MAIN LAB COMING TO OBTAIN LABS

## 2015-09-25 NOTE — ED Notes (Signed)
Pt reports having nausea and requesting anti-nausea medication.  Pt states the medications has to be a "shot" since she can't tolerate anything PO at this time.

## 2015-09-25 NOTE — ED Notes (Signed)
House phelbotomy notified in regards to pt being a hard stick,   Hospital phelbotomy staff will draw pt 0154 BMP.  RN notified.

## 2015-09-25 NOTE — Progress Notes (Signed)
Inpatient Diabetes Program Recommendations  AACE/ADA: New Consensus Statement on Inpatient Glycemic Control (2015)  Target Ranges:  Prepandial:   less than 140 mg/dL      Peak postprandial:   less than 180 mg/dL (1-2 hours)      Critically ill patients:  140 - 180 mg/dL   Results for Carolyn Ayala, Carolyn Ayala (MRN [id number]) as of 09/25/2015 12:30  Ref. Range 09/24/2015 14:21  Sodium Latest Ref Range: 135-145 mmol/L 136  Potassium Latest Ref Range: 3.5-5.1 mmol/L 4.3  Chloride Latest Ref Range: 101-111 mmol/L 100 (L)  CO2 Latest Ref Range: 22-32 mmol/L 8 (L)  BUN Latest Ref Range: 6-20 mg/dL 16  Creatinine Latest Ref Range: 0.44-1.00 mg/dL 1.10 (H)  Calcium Latest Ref Range: 8.9-10.3 mg/dL 9.7  EGFR (Non-African Amer.) Latest Ref Range: >60 mL/min >60  EGFR (African American) Latest Ref Range: >60 mL/min >60  Glucose Latest Ref Range: 65-99 mg/dL 498 (H)  Anion gap Latest Ref Range: 5-15  28 (H)    Admit with: DKA  History: DM, Depression  Home DM Meds: Lantus 55 units QHS       Novolog 15 units tidwc  Current Insulin Orders: IV Insulin drip (per DKA orders)     -BMET from 10 am today shows CO2 only up to 14.  Not ready for transition off IV insulin drip yet per DKA protocol guidelines (CO2 at least 20, Anion Gap 12 or less).  -Patient well known to the Inpatient Glycemic Control Team.  Have seen her on previous admissions.    MD- Once BMET meets criteria to transition to SQ insulin, please make sure patient receives at least 80% of her home dose of Lantus 1-2 hours before IV insulin drip stopped.     --Will follow patient during hospitalization--  Wyn Quaker RN, MSN, CDE Diabetes Coordinator Inpatient Glycemic Control Team Team Pager: [phone] (8a-5p)

## 2015-09-25 NOTE — ED Notes (Signed)
PT REFUSING BLOOD DRAW AT THIS TIME

## 2015-09-25 NOTE — Progress Notes (Signed)
Was the fall witnessed: no  Patient condition before and after the fall: Upon entering pt room after hearing bed alarm activated, pt was found laid on floor beside her bed.  No signs and symptoms of injury observed.  Pt seems to have no changes, pt states "I didn't fall, I got up and laid in the floor to try to feel better."  Pt denies any pain and insist she didn't fall.   RN outside room at desk and heard no indications of a fall as well.   Patient's reaction to the fall: Not upset , calm  Name of the doctor that was notified including date and time: Benedetto Coons. Callahan, NP no new orders received.   Any interventions and vital signs: pt assessed vitals documented in flow sheet will continue to monitor and report.  Pt encouraged not to get out of bed and lay on floor.

## 2015-09-25 NOTE — Progress Notes (Addendum)
9:45am- CSW staffed with nurse. CSW attempted to speak with patient at bedside. Patient asked if CSW could come back another time.   1:45pm- CSW attempted second visit to speak with patient. Patient was asleep and was being transported to inpatient.   Elenore PaddyLaVonia Tremell Reimers, LCSWA [phone] ED CSW  09/25/2015  10:10 AM

## 2015-09-25 NOTE — Progress Notes (Signed)
Patient turned lab away twice but agreed on the third try. Additionally lab called to say PCR was MRSA was positive. Patient placed on contact precautions.

## 2015-09-25 NOTE — ED Notes (Signed)
Writer called main lab for blood work. 

## 2015-09-26 LAB — GLUCOSE, CAPILLARY
Glucose-Capillary: 143 mg/dL — ABNORMAL HIGH (ref 65–99)
Glucose-Capillary: 146 mg/dL — ABNORMAL HIGH (ref 65–99)
Glucose-Capillary: 155 mg/dL — ABNORMAL HIGH (ref 65–99)
Glucose-Capillary: 164 mg/dL — ABNORMAL HIGH (ref 65–99)
Glucose-Capillary: 183 mg/dL — ABNORMAL HIGH (ref 65–99)
Glucose-Capillary: 187 mg/dL — ABNORMAL HIGH (ref 65–99)
Glucose-Capillary: 286 mg/dL — ABNORMAL HIGH (ref 65–99)
Glucose-Capillary: 133 mg/dL — ABNORMAL HIGH (ref 65–99)
Glucose-Capillary: 136 mg/dL — ABNORMAL HIGH (ref 65–99)
Glucose-Capillary: 167 mg/dL — ABNORMAL HIGH (ref 65–99)
Glucose-Capillary: 185 mg/dL — ABNORMAL HIGH (ref 65–99)

## 2015-09-26 LAB — BASIC METABOLIC PANEL
Anion gap: 11 (ref 5–15)
BUN: <5 mg/dL — ABNORMAL LOW (ref 6–20)
CO2: 19 mmol/L — ABNORMAL LOW (ref 22–32)
Calcium: 8.4 mg/dL — ABNORMAL LOW (ref 8.9–10.3)
Chloride: 111 mmol/L (ref 101–111)
Creatinine, Ser: 0.65 mg/dL (ref 0.44–1.00)
GFR calc Af Amer: >60 mL/min (ref >60)
GFR calc non Af Amer: >60 mL/min (ref >60)
Glucose, Bld: 175 mg/dL — ABNORMAL HIGH (ref 65–99)
Potassium: 2.9 mmol/L — ABNORMAL LOW (ref 3.5–5.1)
Sodium: 141 mmol/L (ref 135–145)

## 2015-09-26 MED ORDER — POTASSIUM CHLORIDE 10 MEQ/100ML IV SOLN
INTRAVENOUS | Status: AC
  Administered 2015-09-26: 10 meq
  Filled 2015-09-26: qty 100

## 2015-09-26 MED ORDER — ONDANSETRON HCL 4 MG/2ML IJ SOLN
4.0000 mg | Freq: Once | INTRAMUSCULAR | Status: AC
  Administered 2015-09-26: 4 mg via INTRAVENOUS
  Filled 2015-09-26: qty 2

## 2015-09-26 MED ORDER — DEXTROSE-NACL 5-0.45 % IV SOLN
INTRAVENOUS | Status: DC
  Administered 2015-09-26: 10:00:00 via INTRAVENOUS

## 2015-09-26 MED ORDER — INSULIN GLARGINE 100 UNIT/ML ~~LOC~~ SOLN
60.0000 [IU] | Freq: Every day | SUBCUTANEOUS | Status: DC
  Administered 2015-09-26 – 2015-10-01 (×5): 60 [IU] via SUBCUTANEOUS
  Filled 2015-09-26 (×6): qty 0.6

## 2015-09-26 MED ORDER — INSULIN ASPART 100 UNIT/ML ~~LOC~~ SOLN
0.0000 [IU] | Freq: Every day | SUBCUTANEOUS | Status: DC
  Administered 2015-09-27: 2 [IU] via SUBCUTANEOUS

## 2015-09-26 MED ORDER — POTASSIUM CHLORIDE 10 MEQ/100ML IV SOLN
10.0000 meq | INTRAVENOUS | Status: AC
  Administered 2015-09-26 (×2): 10 meq via INTRAVENOUS
  Filled 2015-09-26: qty 100

## 2015-09-26 MED ORDER — INSULIN ASPART 100 UNIT/ML ~~LOC~~ SOLN
0.0000 [IU] | Freq: Three times a day (TID) | SUBCUTANEOUS | Status: DC
  Administered 2015-09-26: 2 [IU] via SUBCUTANEOUS
  Administered 2015-09-26: 8 [IU] via SUBCUTANEOUS
  Administered 2015-09-27: 5 [IU] via SUBCUTANEOUS
  Administered 2015-09-27: 15 [IU] via SUBCUTANEOUS
  Administered 2015-09-27: 8 [IU] via SUBCUTANEOUS
  Administered 2015-09-28: 5 [IU] via SUBCUTANEOUS
  Administered 2015-09-28: 11 [IU] via SUBCUTANEOUS

## 2015-09-26 NOTE — Progress Notes (Signed)
[facility] PROGRESS NOTE  Carolyn Ayala Carolyn Ayala UJW:[id number] DOB: [birth year] DOA: 09/24/2015 PCP: No primary care provider on file.  Assessment/Plan: 1. Diabetic ketoacidosis -Carolyn Ayala is a 40 year old female with a history of insulin dependent diabetes mellitus, who reported taking  Lantus 55 units daily at bedtime and NovoLog 15 units 3 times a day with meals, resented with multiple episodes of nausea and vomiting. -She was found to be hyperglycemic with labs showing diabetic ketoacidosis. -Lab work revealing resolution to anion gap metabolic acidosis. -Blood sugar stable lantus 60 units subcutaneous daily restarted  2.  Nausea/vomiting -Likely secondary to diabetic ketoacidosis, although there may be a component of diabetic gastroparesis -She reported having multiple episodes of nausea and vomiting overnight -Will schedule Reglan 10 mg IV 3 times a day  3.  Leukocytosis. -Lab work showing a white count of 28,700 -I had suspected this was related to diabetic ketoacidosis. She has been afebrile in the emergency department. Urine chest x-ray did not reveal evidence of infection. Abdominal x-ray was negative. -Will monitor off of antimicrobial therapy -Repeat CBC in a.m.  4. Hypertension. -This morning having blood pressure 130/77, we'll continue lisinopril 10 mg by mouth daily  5.  History of depression with psychotic features. -As previously required inpatient psychiatric treatment. -Continue lithium and Abilify  6.  Hypokalemia. -Lab work showing a potassium of 2.9 -She was given 40 meq IV x4 runs  Code Status: Full code Family Communication:  Disposition Plan: Plan to admit to step down unit   HPI/Subjective: Carolyn Ayala is a 40 year old female with a history of insulin-dependent diabetes mellitus admitted to the medicine service on 09/24/2015 when she presented with complaints of nausea and vomiting, able to keep by mouth down. Lab work in the emergency  department showed she was in diabetic ketoacidosis. She was started on the DKA protocol treated with IV insulin and IV fluids.  Objective: Filed Vitals:   09/26/15 1200 09/26/15 1235  BP:  172/100  Pulse:    Temp: 98.9 F (37.2 C)   Resp:      Intake/Output Summary (Last 24 hours) at 09/26/15 1433 Last data filed at 09/26/15 0600  Gross per 24 hour  Intake 1891.44 ml  Output   1300 ml  Net 591.44 ml   Filed Weights   09/25/15 1419  Weight: 85 kg (187 lb 6.3 oz)    Exam:   General:  She is awake and alert states having a rough night  Cardiovascular: Tachycardic, regular rate rhythm normal S1-S2  Respiratory: Normal respiratory effort  Abdomen: Soft nontender nondistended  Musculoskeletal: No edema  Data Reviewed: Basic Metabolic Panel:  Recent Labs Lab 09/24/15 1526  09/25/15 0533 09/25/15 1012 09/25/15 1604 09/25/15 1948 09/26/15 0513  NA  --   < > 140 141 141 139 141  K  --   < > 4.0 3.3* 3.1* 2.9* 2.9*  CL  --   < > 111 116* 111 111 111  CO2  --   < > 11* 14* 17* 17* 19*  GLUCOSE  --   < > 175* 167* 136* 192* 175*  BUN  --   < > 13 10 9 7  <5*  CREATININE  --   < > 1.10* 0.83 0.75 0.71 0.65  CALCIUM  --   < > 8.4* 8.4* 8.3* 8.3* 8.4*  MG 2.1  --   --   --   --   --   --   < > = values in this interval  not displayed. Liver Function Tests:  Recent Labs Lab 09/21/15 0028 09/24/15 1526  AST 59* 42*  ALT 105* 129*  ALKPHOS 99 193*  BILITOT 0.2* 1.7*  PROT 7.2 8.8*  ALBUMIN 3.8 4.7    Recent Labs Lab 09/24/15 1526  LIPASE 12   No results for input(s): AMMONIA in the last 168 hours. CBC:  Recent Labs Lab 09/21/15 0028 09/24/15 1421 09/24/15 2214  WBC 12.3* 21.7* 28.7*  NEUTROABS 7.6  --   --   HGB 11.4* 12.5 12.4  HCT 35.1* 39.8 40.2  MCV 78.0 81.9 83.9  PLT 360 431* 359   Cardiac Enzymes: No results for input(s): CKTOTAL, CKMB, CKMBINDEX, TROPONINI in the last 168 hours. BNP (last 3 results) No results for input(s): BNP in the  last 8760 hours.  ProBNP (last 3 results) No results for input(s): PROBNP in the last 8760 hours.  CBG:  Recent Labs Lab 09/25/15 2322 09/26/15 0033 09/26/15 0139 09/26/15 0238 09/26/15 0732  GLUCAP 164* 155* 146* 143* 187*    Recent Results (from the past 240 hour(s))  MRSA PCR Screening     Status: Abnormal   Collection Time: 09/25/15  2:22 PM  Result Value Ref Range Status   MRSA by PCR POSITIVE (A) NEGATIVE Final    Comment:        The GeneXpert MRSA Assay (FDA approved for NASAL specimens only), is one component of a comprehensive MRSA colonization surveillance program. It is not intended to diagnose MRSA infection nor to guide or monitor treatment for MRSA infections. RESULT CALLED TO, READ BACK BY AND VERIFIED WITH: ZOE SUGGS,RN [id number] @ 1547 BY J SCOTTON      Studies: Dg Chest 2 View  09/24/2015  CLINICAL DATA:  Nausea.  Cough EXAM: CHEST  2 VIEW COMPARISON:  07/03/2015 FINDINGS: Normal heart size and mediastinal contours. No acute infiltrate or edema. No effusion or pneumothorax. No acute osseous findings. IMPRESSION: Normal chest Electronically Signed   By: Marnee Spring M.D.   On: 09/24/2015 16:35   US Abdomen Limited Ruq  09/24/2015  CLINICAL DATA:  Nausea for 1 day EXAM: US ABDOMEN LIMITED - RIGHT UPPER QUADRANT COMPARISON:  None. FINDINGS: Gallbladder: Mild gallbladder sludge. No stone. No wall thickening or focal tenderness. Common bile duct: Diameter: 3 mm Liver: No focal lesion identified. Within normal limits in parenchymal echogenicity. IMPRESSION: Small volume gallbladder sludge.  Otherwise negative. Electronically Signed   By: Marnee Spring M.D.   On: 09/24/2015 16:59    Scheduled Meds: . ARIPiprazole  20 mg Oral Daily  . enoxaparin (LOVENOX) injection  40 mg Subcutaneous Q24H  . insulin aspart  0-15 Units Subcutaneous TID WC  . insulin aspart  0-5 Units Subcutaneous QHS  . insulin glargine  60 Units Subcutaneous Daily  . lisinopril  10 mg  Oral Daily  . lithium carbonate  300 mg Oral Q12H  . metoCLOPramide (REGLAN) injection  10 mg Intravenous 3 times per day  . mirtazapine  45 mg Oral QHS  . potassium chloride      . simvastatin  20 mg Oral q1800  . sodium chloride flush  3 mL Intravenous Q12H   Continuous Infusions: . dextrose 5 % and 0.45% NaCl 50 mL/hr at 09/26/15 1610    Principal Problem:   DKA (diabetic ketoacidoses) (HCC) Active Problems:   IDDM (insulin dependent diabetes mellitus) (HCC)   Uncontrolled diabetes mellitus with neurologic complication, with long-term current use of insulin (HCC)   HTN (hypertension)  Time spent: 35 min    Jeralyn Bennett  [facility] Pager [phone]. If 7PM-7AM, please contact night-coverage at [email or web address], password [facility] 09/26/2015, 2:33 PM  LOS: 2 days

## 2015-09-26 NOTE — Progress Notes (Signed)
Pt. Refuses iv restart at this time. Staff RN aware.

## 2015-09-27 ENCOUNTER — Inpatient Hospital Stay (HOSPITAL_COMMUNITY): Payer: Self-pay

## 2015-09-27 LAB — BASIC METABOLIC PANEL
Anion gap: 17 — ABNORMAL HIGH (ref 5–15)
BUN: 8 mg/dL (ref 6–20)
CO2: 13 mmol/L — ABNORMAL LOW (ref 22–32)
Calcium: 8.3 mg/dL — ABNORMAL LOW (ref 8.9–10.3)
Chloride: 106 mmol/L (ref 101–111)
Creatinine, Ser: 0.77 mg/dL (ref 0.44–1.00)
GFR calc Af Amer: >60 mL/min (ref >60)
GFR calc non Af Amer: >60 mL/min (ref >60)
Glucose, Bld: 362 mg/dL — ABNORMAL HIGH (ref 65–99)
Potassium: 3.3 mmol/L — ABNORMAL LOW (ref 3.5–5.1)
Sodium: 136 mmol/L (ref 135–145)

## 2015-09-27 LAB — GLUCOSE, CAPILLARY
Glucose-Capillary: 363 mg/dL — ABNORMAL HIGH (ref 65–99)
Glucose-Capillary: 264 mg/dL — ABNORMAL HIGH (ref 65–99)
Glucose-Capillary: 208 mg/dL — ABNORMAL HIGH (ref 65–99)
Glucose-Capillary: 239 mg/dL — ABNORMAL HIGH (ref 65–99)

## 2015-09-27 LAB — CBC
HCT: 39.7 % (ref 36.0–46.0)
Hemoglobin: 13.1 g/dL (ref 12.0–15.0)
MCH: 26.1 pg (ref 26.0–34.0)
MCHC: 33 g/dL (ref 30.0–36.0)
MCV: 79.1 fL (ref 78.0–100.0)
Platelets: 262 10*3/uL (ref 150–400)
RBC: 5.02 MIL/uL (ref 3.87–5.11)
RDW: 17.1 % — ABNORMAL HIGH (ref 11.5–15.5)
WBC: 11.1 10*3/uL — ABNORMAL HIGH (ref 4.0–10.5)

## 2015-09-27 MED ORDER — LORAZEPAM 0.5 MG PO TABS
0.5000 mg | ORAL_TABLET | Freq: Four times a day (QID) | ORAL | Status: DC | PRN
  Administered 2015-09-27 – 2015-09-28 (×2): 0.5 mg via ORAL
  Filled 2015-09-27 (×3): qty 1

## 2015-09-27 MED ORDER — PROCHLORPERAZINE EDISYLATE 5 MG/ML IJ SOLN
10.0000 mg | Freq: Four times a day (QID) | INTRAMUSCULAR | Status: DC | PRN
  Administered 2015-09-27 – 2015-09-30 (×8): 10 mg via INTRAVENOUS
  Filled 2015-09-27 (×8): qty 2

## 2015-09-27 MED ORDER — POTASSIUM CHLORIDE 10 MEQ/100ML IV SOLN
10.0000 meq | INTRAVENOUS | Status: AC
  Administered 2015-09-27 (×2): 10 meq via INTRAVENOUS
  Filled 2015-09-27 (×2): qty 100

## 2015-09-27 NOTE — Progress Notes (Signed)
[name] IV infiltrated, called IV team to restart, pt stated she didn't want it started right now.  I asked her what time, and she said "about 0100".  I called IV nurse at that time, and pt allowed her to put in a new iv.

## 2015-09-27 NOTE — Progress Notes (Signed)
[facility] PROGRESS NOTE  Takirah Binford Gurr EAV:[id number] DOB: 1976/03/21 DOA: 09/24/2015 PCP: No primary care provider on file.  Assessment/Plan: 1. Diabetic ketoacidosis -Mrs Iannuzzi is a 40 year old female with a history of insulin dependent diabetes mellitus, who reported taking  Lantus 55 units daily at bedtime and NovoLog 15 units 3 times a day with meals, resented with multiple episodes of nausea and vomiting. -She was found to be hyperglycemic with labs showing diabetic ketoacidosis. -Lab work revealing resolution to anion gap metabolic acidosis. -Blood sugar stable lantus 60 units subcutaneous daily restarted  2.  Nausea/vomiting -Likely secondary to diabetic ketoacidosis, although there may be a component of diabetic gastroparesis -She reported having multiple episodes of nausea and vomiting overnight -Will schedule Reglan 10 mg IV 3 times a day -On 09/27/2015 she continues to have repeated episodes of nausea and vomiting. I think she may be having diabetic gastroparesis flareup.  3.  Leukocytosis. -Lab work showing a white count of 28,700 -I had suspected this was related to diabetic ketoacidosis. She has been afebrile in the emergency department. Urine chest x-ray did not reveal evidence of infection. Abdominal x-ray was negative. -Will monitor off of antimicrobial therapy -Labs on 09/27/2015 showing downward trend her white count to 11,100  4. Hypertension. -This morning having blood pressure 130/77, we'll continue lisinopril 10 mg by mouth daily  5.  History of depression with psychotic features. -As previously required inpatient psychiatric treatment. -Continue lithium and Abilify  6.  Hypokalemia. -She was given IV potassium, repeat labs showing potassium 3.3. -Will give 2 g of IV potassium today. -Hypokalemia likely related to GI losses from nausea and vomiting  Code Status: Full code Family Communication:  Disposition Plan: Plan to admit to step  down unit   HPI/Subjective: Mrs Sappington is a 40 year old female with a history of insulin-dependent diabetes mellitus admitted to the medicine service on 09/24/2015 when she presented with complaints of nausea and vomiting, able to keep by mouth down. Lab work in the emergency department showed she was in diabetic ketoacidosis. She was started on the DKA protocol treated with IV insulin and IV fluids.  Objective: Filed Vitals:   09/27/15 0528 09/27/15 1410  BP: 148/96 96/59  Pulse: 105 110  Temp: 98.5 F (36.9 C) 99.6 F (37.6 C)  Resp: 16 18    Intake/Output Summary (Last 24 hours) at 09/27/15 1524 Last data filed at 09/27/15 0600  Gross per 24 hour  Intake    905 ml  Output      0 ml  Net    905 ml   Filed Weights   09/25/15 1419 09/26/15 1703  Weight: 85 kg (187 lb 6.3 oz) 87.4 kg (192 lb 10.9 oz)    Exam:   General:  She is awake and alert states That she continues to feel sick with nausea/vomiting  Cardiovascular: Tachycardic, regular rate rhythm normal S1-S2  Respiratory: Normal respiratory effort  Abdomen: Soft nontender nondistended  Musculoskeletal: No edema  Data Reviewed: Basic Metabolic Panel:  Recent Labs Lab 09/24/15 1526  09/25/15 1012 09/25/15 1604 09/25/15 1948 09/26/15 0513 09/27/15 0815  NA  --   < > 141 141 139 141 136  K  --   < > 3.3* 3.1* 2.9* 2.9* 3.3*  CL  --   < > 116* 111 111 111 106  CO2  --   < > 14* 17* 17* 19* 13*  GLUCOSE  --   < > 167* 136* 192* 175* 362*  BUN  --   < >  10 9 7  <5* 8  CREATININE  --   < > 0.83 0.75 0.71 0.65 0.77  CALCIUM  --   < > 8.4* 8.3* 8.3* 8.4* 8.3*  MG 2.1  --   --   --   --   --   --   < > = values in this interval not displayed. Liver Function Tests:  Recent Labs Lab 09/21/15 0028 09/24/15 1526  AST 59* 42*  ALT 105* 129*  ALKPHOS 99 193*  BILITOT 0.2* 1.7*  PROT 7.2 8.8*  ALBUMIN 3.8 4.7    Recent Labs Lab 09/24/15 1526  LIPASE 12   No results for input(s): AMMONIA in the  last 168 hours. CBC:  Recent Labs Lab 09/21/15 0028 09/24/15 1421 09/24/15 2214 09/27/15 0815  WBC 12.3* 21.7* 28.7* 11.1*  NEUTROABS 7.6  --   --   --   HGB 11.4* 12.5 12.4 13.1  HCT 35.1* 39.8 40.2 39.7  MCV 78.0 81.9 83.9 79.1  PLT 360 431* 359 262   Cardiac Enzymes: No results for input(s): CKTOTAL, CKMB, CKMBINDEX, TROPONINI in the last 168 hours. BNP (last 3 results) No results for input(s): BNP in the last 8760 hours.  ProBNP (last 3 results) No results for input(s): PROBNP in the last 8760 hours.  CBG:  Recent Labs Lab 09/26/15 0732 09/26/15 0933 09/26/15 1630 09/27/15 0729 09/27/15 1149  GLUCAP 187* 136* 286* 363* 264*    Recent Results (from the past 240 hour(s))  MRSA PCR Screening     Status: Abnormal   Collection Time: 09/25/15  2:22 PM  Result Value Ref Range Status   MRSA by PCR POSITIVE (A) NEGATIVE Final    Comment:        The GeneXpert MRSA Assay (FDA approved for NASAL specimens only), is one component of a comprehensive MRSA colonization surveillance program. It is not intended to diagnose MRSA infection nor to guide or monitor treatment for MRSA infections. RESULT CALLED TO, READ BACK BY AND VERIFIED WITH: ZOE SUGGS,RN [id number] @ 1547 BY J SCOTTON      Studies: No results found.  Scheduled Meds: . ARIPiprazole  20 mg Oral Daily  . enoxaparin (LOVENOX) injection  40 mg Subcutaneous Q24H  . insulin aspart  0-15 Units Subcutaneous TID WC  . insulin aspart  0-5 Units Subcutaneous QHS  . insulin glargine  60 Units Subcutaneous Daily  . lisinopril  10 mg Oral Daily  . lithium carbonate  300 mg Oral Q12H  . metoCLOPramide (REGLAN) injection  10 mg Intravenous 3 times per day  . mirtazapine  45 mg Oral QHS  . simvastatin  20 mg Oral q1800  . sodium chloride flush  3 mL Intravenous Q12H   Continuous Infusions:    Principal Problem:   DKA (diabetic ketoacidoses) (HCC) Active Problems:   IDDM (insulin dependent diabetes mellitus)  (HCC)   Uncontrolled diabetes mellitus with neurologic complication, with long-term current use of insulin (HCC)   HTN (hypertension)    Time spent: 25 min    Jeralyn BennettZAMORA, Raissa Dam  [facility] Pager [phone]. If 7PM-7AM, please contact night-coverage at [email or web address], password [email or web address] 09/27/2015, 3:24 PM  LOS: 3 days

## 2015-09-28 DIAGNOSIS — F333 Major depressive disorder, recurrent, severe with psychotic symptoms: Secondary | ICD-10-CM

## 2015-09-28 LAB — GLUCOSE, CAPILLARY
Glucose-Capillary: 286 mg/dL — ABNORMAL HIGH (ref 65–99)
Glucose-Capillary: 317 mg/dL — ABNORMAL HIGH (ref 65–99)
Glucose-Capillary: 271 mg/dL — ABNORMAL HIGH (ref 65–99)
Glucose-Capillary: 249 mg/dL — ABNORMAL HIGH (ref 65–99)
Glucose-Capillary: 193 mg/dL — ABNORMAL HIGH (ref 65–99)

## 2015-09-28 MED ORDER — MUPIROCIN 2 % EX OINT
1.0000 "application " | TOPICAL_OINTMENT | Freq: Two times a day (BID) | CUTANEOUS | Status: DC
  Administered 2015-09-28 – 2015-10-02 (×6): 1 via NASAL
  Filled 2015-09-28: qty 22

## 2015-09-28 MED ORDER — CHLORHEXIDINE GLUCONATE CLOTH 2 % EX PADS
6.0000 | MEDICATED_PAD | Freq: Every day | CUTANEOUS | Status: DC
  Administered 2015-09-30 – 2015-10-02 (×2): 6 via TOPICAL

## 2015-09-28 NOTE — Progress Notes (Signed)
CSW received consult that pt concerned about hospital bill.   Inappropriate CSW referral.   CSW signing off.   Please re-consult if social work needs arise.  Loletta SpecterSuzanna Kassius Battiste, MSW, LCSW Clinical Social Work 5E coverage [phone]

## 2015-09-28 NOTE — Hospital Discharge Follow-Up (Signed)
Message received from Radford PaxAmy Ferrero, RN CM requesting a hospital follow up appointment for the patient. Voice mail message left for Marcelle Smilinghonda Davis, RN CM informing her of the request for follow up.  Attempted to meet with the patient this afternoon to [facility] but the patient said that she was not feeling well and didn't want to talk.   Will follow up with her at a later time.

## 2015-09-28 NOTE — Progress Notes (Signed)
Patient called at 2100 09/27/15 and said that she is on the floor, so when NT and nurse went in room she was comfortably laying on the floor with blanket spread under her and a pillow under her head. When ask , she said that she fell over the rails. No injury, no hit on the head. MD notified no orders received.  At 0410 the IV nurse informed this RN that the patient had lay on floor on 09/25/15 because it feels good. see note on 4/14 at 2145 . patient instructed not to do that again.bed alarm on

## 2015-09-28 NOTE — Progress Notes (Signed)
Last night pt removed tele leads and will not allow RN to place tele monitor back. Pt refusing all labs, vitals and blood sugar draws this morning. NT and RN informed the pt that she needs her blood sugar taken. Pt still refusing. MD is aware.  Frederic Tones W Dillinger Aston, RN

## 2015-09-28 NOTE — Progress Notes (Signed)
   09/28/15 2116  What Happened  Was fall witnessed? No  Was patient injured? No  Patient found on floor  Found by Staff-comment  Stated prior activity ambulating-unassisted  Follow Up  MD notified Craige CottaKirby  Time MD notified 2130  Family notified No- patient refusal  Additional tests No  Progress note created (see row info) Yes  Adult Fall Risk Assessment  Risk Factor Category (scoring not indicated) High fall risk per protocol (document High fall risk)  Patient's Fall Risk High Fall Risk (>13 points)  Adult Fall Risk Interventions  Required Bundle Interventions *See Row Information* High fall risk - low, moderate, and high requirements implemented  Additional Interventions Fall risk signage  Fall with Injury Screening  Risk For Fall Injury- See Row Information  Nurse judgement  Vitals  Temp 98.6 F (37 C)  BP 120/72 mmHg  BP Location Left Arm  BP Method Automatic  Patient Position (if appropriate) Sitting  Pulse Rate (!) 117  Pulse Rate Source Dinamap  Resp 18  Oxygen Therapy  SpO2 100 %  O2 Device Nasal Cannula  Pain Assessment  Pain Assessment No/denies pain  Neurological  Neuro (WDL) X  Level of Consciousness Alert  Orientation Level Oriented to person;Oriented to place;Oriented to situation;Disoriented to time  Cognition Impulsive;Poor judgement;Poor safety awareness  Speech Clear  Pupil Assessment  Yes  R Pupil Size (mm) 4  R Pupil Shape Round  R Pupil Reaction Brisk  L Pupil Size (mm) 4  L Pupil Shape Round  L Pupil Reaction Brisk  Additional Pupil Assessments No  Neuro Symptoms None  Neuro Additional Assessments No  Glasgow Coma Scale  Eye Opening 3  Best Verbal Response (NON-intubated) 5  Best Motor Response 6  Musculoskeletal  Musculoskeletal (WDL) X  Assistive Device None  Generalized Weakness Yes  Weight Bearing Restrictions No  Integumentary  Integumentary (WDL) WDL

## 2015-09-28 NOTE — Progress Notes (Signed)
[facility] PROGRESS NOTE  Carolyn Ayala GNF:[id number] DOB: 07-May-1976 DOA: 09/24/2015 PCP: No primary care provider on file.  Assessment/Plan: 1. Diabetic ketoacidosis -Carolyn Ayala is a 40 year old female with a history of insulin dependent diabetes mellitus, who reported taking  Lantus 55 units daily at bedtime and NovoLog 15 units 3 times a day with meals, resented with multiple episodes of nausea and vomiting. -She was found to be hyperglycemic with labs showing diabetic ketoacidosis. -Lab work revealing resolution to anion gap metabolic acidosis. -Unfortunately I think due to underlying psychiatric illness she has refused insulin on several occasions making control of blood sugars difficult. I had a discussion with her today regarding my concerns, as she agreed to take her insulin in my presence. Blood sugars remained in the 203 100 range.  2.  Nausea/vomiting -Likely secondary to diabetic ketoacidosis, although there may be a component of diabetic gastroparesis -She reported having multiple episodes of nausea and vomiting overnight -Will schedule Reglan 10 mg IV 3 times a day -On 09/27/2015 she continues to have repeated episodes of nausea and vomiting. I think she may be having diabetic gastroparesis flareup. -By 09/28/2015 diet advanced with clinical improvement.  3.  Leukocytosis. -Lab work showing a white count of 28,700 -I had suspected this was related to diabetic ketoacidosis. She has been afebrile in the emergency department. Urine chest x-ray did not reveal evidence of infection. Abdominal x-ray was negative. -Will monitor off of antimicrobial therapy -Labs on 09/27/2015 showing downward trend her white count to 11,100  4. Hypertension. -This morning having blood pressure 130/77, we'll continue lisinopril 10 mg by mouth daily  5.  History of depression with psychotic features. -As previously required inpatient psychiatric treatment. -Continue lithium and  Abilify  6.  Hypokalemia. -She was given IV potassium, repeat labs showing potassium 3.3. -Will give 2 g of IV potassium today. -Hypokalemia likely related to GI losses from nausea and vomiting  7.  Major depression -She has a history of depression with psychosis requiring inpatient psychiatric treatment. I spoke to her about her mood she states she has been feeling sad and depressed lately. She states having depression since the death of her mother. During this hospitalization she has refused insulin and psych meds on several occasions as well as allowing lab work and Accu-Cheks to be done. I get the sense that she is not interested in participating in her own plan of care regarding diabetic management, which could be stemming from her underlying mood disorder. -Psychiatry has been consulted for further recommendations on this.  Code Status: Full code Family Communication:  Disposition Plan: Psychiatry consulted   HPI/Subjective: Carolyn Ayala is a 40 year old female with a history of insulin-dependent diabetes mellitus admitted to the medicine service on 09/24/2015 when she presented with complaints of nausea and vomiting, able to keep by mouth down. Lab work in the emergency department showed she was in diabetic ketoacidosis. She was started on the DKA protocol treated with IV insulin and IV fluids.  Objective: Filed Vitals:   09/27/15 1410 09/27/15 2115  BP: 96/59 131/80  Pulse: 110 108  Temp: 99.6 F (37.6 C)   Resp: 18 18   No intake or output data in the 24 hours ending 09/28/15 1608 Filed Weights   09/25/15 1419 09/26/15 1703  Weight: 85 kg (187 lb 6.3 oz) 87.4 kg (192 lb 10.9 oz)    Exam:   General:  Patient having a flat affect, appears depressed  Cardiovascular: Tachycardic, regular rate rhythm normal S1-S2  Respiratory: Normal respiratory effort  Abdomen: Soft nontender nondistended  Musculoskeletal: No edema  Data Reviewed: Basic Metabolic Panel:  Recent  Labs Lab 09/24/15 1526  09/25/15 1012 09/25/15 1604 09/25/15 1948 09/26/15 0513 09/27/15 0815  NA  --   < > 141 141 139 141 136  K  --   < > 3.3* 3.1* 2.9* 2.9* 3.3*  CL  --   < > 116* 111 111 111 106  CO2  --   < > 14* 17* 17* 19* 13*  GLUCOSE  --   < > 167* 136* 192* 175* 362*  BUN  --   < > <5* 8  CREATININE  --   < > 0.83 0.75 0.71 0.65 0.77  CALCIUM  --   < > 8.4* 8.3* 8.3* 8.4* 8.3*  MG 2.1  --   --   --   --   --   --   < > = values in this interval not displayed. Liver Function Tests:  Recent Labs Lab 09/24/15 1526  AST 42*  ALT 129*  ALKPHOS 193*  BILITOT 1.7*  PROT 8.8*  ALBUMIN 4.7    Recent Labs Lab 09/24/15 1526  LIPASE 12   No results for input(s): AMMONIA in the last 168 hours. CBC:  Recent Labs Lab 09/24/15 1421 09/24/15 2214 09/27/15 0815  WBC 21.7* 28.7* 11.1*  HGB 12.5 12.4 13.1  HCT 39.8 40.2 39.7  MCV 81.9 83.9 79.1  PLT 431* 359 262   Cardiac Enzymes: No results for input(s): CKTOTAL, CKMB, CKMBINDEX, TROPONINI in the last 168 hours. BNP (last 3 results) No results for input(s): BNP in the last 8760 hours.  ProBNP (last 3 results) No results for input(s): PROBNP in the last 8760 hours.  CBG:  Recent Labs Lab 09/27/15 1653 09/27/15 2118 09/28/15 0839 09/28/15 1140 09/28/15 1513  GLUCAP 208* 239* 286* 317* 271*    Recent Results (from the past 240 hour(s))  MRSA PCR Screening     Status: Abnormal   Collection Time: 09/25/15  2:22 PM  Result Value Ref Range Status   MRSA by PCR POSITIVE (A) NEGATIVE Final    Comment:        The GeneXpert MRSA Assay (FDA approved for NASAL specimens only), is one component of a comprehensive MRSA colonization surveillance program. It is not intended to diagnose MRSA infection nor to guide or monitor treatment for MRSA infections. RESULT CALLED TO, READ BACK BY AND VERIFIED WITH: ZOE SUGGS,RN [id number] @ 1547 BY J SCOTTON      Studies: Dg Abd 1 View  09/27/2015  CLINICAL  DATA:  Abdominal pain EXAM: ABDOMEN - 1 VIEW COMPARISON:  07/03/2015 FINDINGS: Scattered large and small bowel gas is noted. No free air is seen. No abnormal mass or abnormal calcifications are noted. Calcified uterine fibroid is again seen and stable. No bony abnormality is noted. IMPRESSION: No acute abnormality seen. Electronically Signed   By: Alcide Clever M.D.   On: 09/27/2015 16:00    Scheduled Meds: . ARIPiprazole  20 mg Oral Daily  . [START ON 09/29/2015] Chlorhexidine Gluconate Cloth  6 each Topical Q0600  . enoxaparin (LOVENOX) injection  40 mg Subcutaneous Q24H  . insulin aspart  0-15 Units Subcutaneous TID WC  . insulin aspart  0-5 Units Subcutaneous QHS  . insulin glargine  60 Units Subcutaneous Daily  . lisinopril  10 mg Oral Daily  . lithium carbonate  300 mg Oral Q12H  . metoCLOPramide (REGLAN)  injection  10 mg Intravenous 3 times per day  . mirtazapine  45 mg Oral QHS  . mupirocin ointment  1 application Nasal BID  . simvastatin  20 mg Oral q1800  . sodium chloride flush  3 mL Intravenous Q12H   Continuous Infusions:    Principal Problem:   Depression, major, recurrent, severe with psychosis (HCC) Active Problems:   DKA (diabetic ketoacidoses) (HCC)   IDDM (insulin dependent diabetes mellitus) (HCC)   Uncontrolled diabetes mellitus with neurologic complication, with long-term current use of insulin (HCC)   HTN (hypertension)    Time spent: 30 min   Jeralyn Bennett  [facility] Pager [phone]. If 7PM-7AM, please contact night-coverage at [email or web address], password [facility] 09/28/2015, 4:08 PM  LOS: 4 days

## 2015-09-28 NOTE — Consult Note (Signed)
[facility] Consult   Reason for Consult:  Depression and non compliance with treatment Referring Physician:  Dr. Coralyn Pear Patient Identification: Carolyn Ayala MRN:  [id number] Principal Diagnosis: Depression, major, recurrent, severe with psychosis ([place]) Diagnosis:   Patient Active Problem List   Diagnosis Date Noted  . DKA (diabetic ketoacidoses) ([place]) [E13.10] 09/24/2015  . HTN (hypertension) [I10] 09/24/2015  . Diabetes mellitus without complication ([place]) [Z16.9]   . Hyperlipidemia [E78.5] 08/10/2015  . Alcohol use disorder, moderate, dependence ([place]) [F10.20] 08/10/2015  . Neuropathic pain [M79.2] 08/08/2015  . Seasonal allergic rhinitis [J30.2] 08/08/2015  . Depression, major, recurrent, severe with psychosis ([place]) [F33.3] 08/06/2015  . Uncontrolled diabetes mellitus with neurologic complication, with long-term current use of insulin (H[facility]) [E11.49, Z79.4, E11.65]   . Uncontrolled diabetes mellitus with diabetic neuropathy, with long-term current use of insulin ([place]) [E11.40, Z79.4, E11.65] 04/21/2015  . Generalized anxiety disorder [F41.1] 02/13/2013  . Chronic posttraumatic stress disorder [F43.12] 02/13/2013  . Substance induced mood disorder ([place]) [F19.94] 02/08/2013  . IDDM (insulin dependent diabetes mellitus) ([place]) [E11.9, Z79.4] 02/08/2013    Total Time spent with patient: 1 hour  Subjective:   Carolyn Ayala is a 40 y.o. female patient admitted with depression and non compliance with medication therapy.  HPI:  Carolyn Ayala is a 40 y.o. Female, case discussed with Dr. Coralyn Pear for this face-to-face psychiatric consultation and evaluation of increased symptoms of depression and noncompliant with her medication management for depression and also insulin-dependent diabetes mellitus. Patient has been suffering with nausea and vomiting's for the several days due to poor diabetic control and diabetic gastroparesis and neuropathy. Patient  stated that she has been noncompliant with her medications and outpatient medication management since discharged from the hospital. Patient has been depressed more frequently since her mother passed away about 2 months ago and has noncompliant with her medication management because of no funds or insurance. Patient admitted to the hospital with nausea and vomiting's and increased heart rate. Patient is hoping she can go to [place] and living with her niece and help her with her 2 kids. Patient denies current symptoms of suicidal/homicidal ideation, intention or plans. Staff RN reported patient has been partially compliant with her insulin therapy. Patient having difficulty with food and fluid intake by mouth. Patient is not reliable during this evaluation. Patient reportedly stayed sober since last hospitalization at [facility] 2 months ago and seems to be proud when saying it with a big smile. Her recent hospitalization about a week ago shows her alcohol level is 138.  Past Psychiatric History: Patient is known to the psychiatric hospital and psychiatric consultation. She was admitted at [facility] with major depressive disorder with psychosis, last admission February 2017.   Risk to Self: Is patient at risk for suicide?: No Risk to Others:   Prior Inpatient Therapy:   Prior Outpatient Therapy:    Past Medical History:  Past Medical History  Diagnosis Date  . Mental disorder   . Depression   . Diabetes mellitus without complication ([place])   . Hypertension     Past Surgical History  Procedure Laterality Date  . Cesarean section     Family History:  Family History  Problem Relation Age of Onset  . Depression Mother    Family Psychiatric  History: Patient has no family members in [place] and her mother passed away 2 months ago with multiple medical problems. Social History:  History  Alcohol Use  . Yes    Comment:  daily     History  Drug Use No    Social History    Social History  . Marital Status: Single    Spouse Name: N/A  . Number of Children: N/A  . Years of Education: N/A   Social History Main Topics  . Smoking status: Never Smoker   . Smokeless tobacco: Current User  . Alcohol Use: Yes     Comment: daily  . Drug Use: No  . Sexual Activity: No   Other Topics Concern  . None   Social History Narrative   ** Merged History Encounter **       Additional Social History:    Allergies:  No Known Allergies  Labs:  Results for orders placed or performed during the hospital encounter of 09/24/15 (from the past 48 hour(s))  Glucose, capillary     Status: Abnormal   Collection Time: 09/26/15  4:30 PM  Result Value Ref Range   Glucose-Capillary 286 (H) 65 - 99 mg/dL  Glucose, capillary     Status: Abnormal   Collection Time: 09/27/15  7:29 AM  Result Value Ref Range   Glucose-Capillary 363 (H) 65 - 99 mg/dL  Basic metabolic panel     Status: Abnormal   Collection Time: 09/27/15  8:15 AM  Result Value Ref Range   Sodium 136 135 - 145 mmol/L   Potassium 3.3 (L) 3.5 - 5.1 mmol/L   Chloride 106 101 - 111 mmol/L   CO2 13 (L) 22 - 32 mmol/L   Glucose, Bld 362 (H) 65 - 99 mg/dL   BUN 8 6 - 20 mg/dL   Creatinine, Ser 0.77 0.44 - 1.00 mg/dL   Calcium 8.3 (L) 8.9 - 10.3 mg/dL   GFR calc non Af Amer >60 >60 mL/min   GFR calc Af Amer >60 >60 mL/min    Comment: (NOTE) The eGFR has been calculated using the CKD EPI equation. This calculation has not been validated in all clinical situations. eGFR's persistently <60 mL/min signify possible Chronic Kidney Disease.    Anion gap 17 (H) 5 - 15  CBC     Status: Abnormal   Collection Time: 09/27/15  8:15 AM  Result Value Ref Range   WBC 11.1 (H) 4.0 - 10.5 K/uL   RBC 5.02 3.87 - 5.11 MIL/uL   Hemoglobin 13.1 12.0 - 15.0 g/dL   HCT 39.7 36.0 - 46.0 %   MCV 79.1 78.0 - 100.0 fL   MCH 26.1 26.0 - 34.0 pg   MCHC 33.0 30.0 - 36.0 g/dL   RDW 17.1 (H) 11.5 - 15.5 %   Platelets 262 150 - 400  K/uL  Glucose, capillary     Status: Abnormal   Collection Time: 09/27/15 11:49 AM  Result Value Ref Range   Glucose-Capillary 264 (H) 65 - 99 mg/dL  Glucose, capillary     Status: Abnormal   Collection Time: 09/27/15  4:53 PM  Result Value Ref Range   Glucose-Capillary 208 (H) 65 - 99 mg/dL  Glucose, capillary     Status: Abnormal   Collection Time: 09/27/15  9:18 PM  Result Value Ref Range   Glucose-Capillary 239 (H) 65 - 99 mg/dL  Glucose, capillary     Status: Abnormal   Collection Time: 09/28/15  8:39 AM  Result Value Ref Range   Glucose-Capillary 286 (H) 65 - 99 mg/dL    Current Facility-Administered Medications  Medication Dose Route Frequency Provider Last Rate Last Dose  . ARIPiprazole (ABILIFY) tablet 20 mg  20 mg Oral Daily Kelvin Cellar, MD   20 mg at 09/27/15 1631  . enoxaparin (LOVENOX) injection 40 mg  40 mg Subcutaneous Q24H Kelvin Cellar, MD   40 mg at 09/27/15 2120  . fluticasone (FLONASE) 50 MCG/ACT nasal spray 1 spray  1 spray Each Nare Daily PRN Kelvin Cellar, MD      . hydrOXYzine (ATARAX/VISTARIL) tablet 50 mg  50 mg Oral Q6H PRN Kelvin Cellar, MD   50 mg at 09/26/15 0414  . ibuprofen (ADVIL,MOTRIN) tablet 400 mg  400 mg Oral Q6H PRN Kelvin Cellar, MD      . insulin aspart (novoLOG) injection 0-15 Units  0-15 Units Subcutaneous TID WC Kelvin Cellar, MD   5 Units at 09/27/15 1800  . insulin aspart (novoLOG) injection 0-5 Units  0-5 Units Subcutaneous QHS Kelvin Cellar, MD   2 Units at 09/27/15 2254  . insulin glargine (LANTUS) injection 60 Units  60 Units Subcutaneous Daily Kelvin Cellar, MD   60 Units at 09/27/15 1147  . lisinopril (PRINIVIL,ZESTRIL) tablet 10 mg  10 mg Oral Daily Kelvin Cellar, MD   10 mg at 09/27/15 1631  . lithium carbonate (LITHOBID) CR tablet 300 mg  300 mg Oral Q12H Kelvin Cellar, MD   300 mg at 09/27/15 1632  . LORazepam (ATIVAN) tablet 0.5 mg  0.5 mg Oral Q6H PRN Kelvin Cellar, MD   0.5 mg at 09/27/15 1323  .  metoCLOPramide (REGLAN) injection 10 mg  10 mg Intravenous 3 times per day Kelvin Cellar, MD   10 mg at 09/28/15 0539  . mirtazapine (REMERON) tablet 45 mg  45 mg Oral QHS Kelvin Cellar, MD   45 mg at 09/25/15 0203  . ondansetron (ZOFRAN) injection 4 mg  4 mg Intravenous Q6H PRN Kelvin Cellar, MD   4 mg at 09/28/15 0836  . prochlorperazine (COMPAZINE) injection 10 mg  10 mg Intravenous Q6H PRN Kelvin Cellar, MD   10 mg at 09/28/15 1030  . promethazine (PHENERGAN) tablet 12.5 mg  12.5 mg Oral Q6H PRN Kelvin Cellar, MD   12.5 mg at 09/27/15 1639  . simvastatin (ZOCOR) tablet 20 mg  20 mg Oral q1800 Kelvin Cellar, MD   20 mg at 09/25/15 0201  . sodium chloride flush (NS) 0.9 % injection 3 mL  3 mL Intravenous Q12H Kelvin Cellar, MD   3 mL at 09/27/15 1000    Musculoskeletal: Strength & Muscle Tone: decreased Gait & Station: unable to stand Patient leans: N/A  Psychiatric Specialty Exam: ROS complaining about nausea vomiting secondary to diabetic gastroparesis and partially compliant with her medication management. She has history of depression with psychosis and alcohol abuse versus dependence. No Fever-chills, No Headache, No changes with Vision or hearing, reports vertigo No problems swallowing food or Liquids, No Chest pain, Cough or Shortness of Breath, No Abdominal pain, No Nausea or Vommitting, Bowel movements are regular, No Blood in stool or Urine, No dysuria, No new skin rashes or bruises, No new joints pains-aches,  No new weakness, tingling, numbness in any extremity, No recent weight gain or loss,   A full 10 point Review of Systems was done, except as stated above, all other Review of Systems were negative.  Blood pressure 131/80, pulse 108, temperature 99.6 F (37.6 C), temperature source Oral, resp. rate 18, height 5' 2" (1.575 m), weight 87.4 kg (192 lb 10.9 oz), last menstrual period 09/24/2015, SpO2 100 %.Body mass index is 35.23 kg/(m^2).  General  Appearance: Guarded  Eye Contact::  Good  Speech:  Clear and Coherent  Volume:  Normal  Mood:  Anxious and Depressed  Affect:  Congruent and Depressed  Thought Process:  Coherent and Goal Directed  Orientation:  Full (Time, Place, and Person)  Thought Content:  WDL  Suicidal Thoughts:  No  Homicidal Thoughts:  No  Memory:  Immediate;   Fair Recent;   Fair  Judgement:  Impaired  Insight:  Shallow  Psychomotor Activity:  Decreased  Concentration:  Fair  Recall:  [place]: Good  Akathisia:  Negative  Handed:  Right  AIMS (if indicated):     Assets:  Communication Skills Desire for Improvement Housing Leisure Time Resilience Transportation  ADL's:  Impaired  Cognition: WNL  Sleep:      Treatment Plan Summary: Patient has no safety concerns as she denies current suicidal/homicidal ideation and psychosis. Patient has capacity to make her own medical decisions and living arrangements based on my evaluation Patient benefit from psychiatric medications as prescribed;  Abilify 20 mg daily for depression and psychosis,  hydroxyzine 50 mg for anxiety as needed,  Lithobid 300 mg twice daily for mood swings Decreased Remeron 15 mg at bedtime for insomnia Patient will be referred to the outpatient medication management when medical stable and cleared Appreciate psychiatric consultation and follow up as clinically required Please contact [phone] or [phone] if needs further assistance  Disposition: Patient does not meet criteria for psychiatric inpatient admission. Supportive therapy provided about ongoing stressors.  Durward Parcel., MD 09/28/2015 11:41 AM

## 2015-09-29 LAB — BASIC METABOLIC PANEL
Anion gap: 13 (ref 5–15)
BUN: 11 mg/dL (ref 6–20)
CO2: 21 mmol/L — ABNORMAL LOW (ref 22–32)
Calcium: 8.7 mg/dL — ABNORMAL LOW (ref 8.9–10.3)
Chloride: 102 mmol/L (ref 101–111)
Creatinine, Ser: 0.7 mg/dL (ref 0.44–1.00)
GFR calc Af Amer: >60 mL/min (ref >60)
GFR calc non Af Amer: >60 mL/min (ref >60)
Glucose, Bld: 210 mg/dL — ABNORMAL HIGH (ref 65–99)
Potassium: 3.6 mmol/L (ref 3.5–5.1)
Sodium: 136 mmol/L (ref 135–145)

## 2015-09-29 LAB — CBC
HCT: 42.4 % (ref 36.0–46.0)
Hemoglobin: 14 g/dL (ref 12.0–15.0)
MCH: 25.1 pg — ABNORMAL LOW (ref 26.0–34.0)
MCHC: 33 g/dL (ref 30.0–36.0)
MCV: 76.1 fL — ABNORMAL LOW (ref 78.0–100.0)
Platelets: 355 10*3/uL (ref 150–400)
RBC: 5.57 MIL/uL — ABNORMAL HIGH (ref 3.87–5.11)
RDW: 16.9 % — ABNORMAL HIGH (ref 11.5–15.5)
WBC: 8.8 10*3/uL (ref 4.0–10.5)

## 2015-09-29 LAB — GLUCOSE, CAPILLARY
Glucose-Capillary: 210 mg/dL — ABNORMAL HIGH (ref 65–99)
Glucose-Capillary: 303 mg/dL — ABNORMAL HIGH (ref 65–99)
Glucose-Capillary: 192 mg/dL — ABNORMAL HIGH (ref 65–99)

## 2015-09-29 MED ORDER — HYDRALAZINE HCL 20 MG/ML IJ SOLN: 10.0000 mg | INTRAMUSCULAR | Status: DC | PRN

## 2015-09-29 MED ORDER — SODIUM CHLORIDE 0.9 % IV SOLN
INTRAVENOUS | Status: DC
  Administered 2015-09-29 – 2015-09-30 (×3): via INTRAVENOUS

## 2015-09-29 MED ORDER — PHENOL 1.4 % MT LIQD
1.0000 | OROMUCOSAL | Status: DC | PRN
  Filled 2015-09-29: qty 177

## 2015-09-29 MED ORDER — GUAIFENESIN-DM 100-10 MG/5ML PO SYRP: 5.0000 mL | ORAL_SOLUTION | ORAL | Status: DC | PRN

## 2015-09-29 MED ORDER — LORAZEPAM 2 MG/ML IJ SOLN
0.5000 mg | Freq: Four times a day (QID) | INTRAMUSCULAR | Status: DC | PRN
  Administered 2015-09-29 – 2015-10-01 (×6): 0.5 mg via INTRAVENOUS
  Filled 2015-09-29 (×7): qty 1

## 2015-09-29 MED ORDER — GI COCKTAIL ~~LOC~~
30.0000 mL | Freq: Once | ORAL | Status: DC
  Filled 2015-09-29: qty 30

## 2015-09-29 MED ORDER — MIRTAZAPINE 15 MG PO TABS
15.0000 mg | ORAL_TABLET | Freq: Every day | ORAL | Status: DC
  Administered 2015-09-29 – 2015-10-01 (×3): 15 mg via ORAL
  Filled 2015-09-29 (×5): qty 1

## 2015-09-29 MED ORDER — INSULIN ASPART 100 UNIT/ML ~~LOC~~ SOLN
0.0000 [IU] | SUBCUTANEOUS | Status: DC
  Administered 2015-09-29: 3 [IU] via SUBCUTANEOUS
  Administered 2015-09-29: 11 [IU] via SUBCUTANEOUS
  Administered 2015-09-30 (×2): 8 [IU] via SUBCUTANEOUS
  Administered 2015-09-30: 5 [IU] via SUBCUTANEOUS
  Administered 2015-09-30: 3 [IU] via SUBCUTANEOUS
  Administered 2015-09-30: 8 [IU] via SUBCUTANEOUS
  Administered 2015-09-30 – 2015-10-01 (×2): 3 [IU] via SUBCUTANEOUS
  Administered 2015-10-01: 11 [IU] via SUBCUTANEOUS
  Administered 2015-10-01: 5 [IU] via SUBCUTANEOUS
  Administered 2015-10-01: 3 [IU] via SUBCUTANEOUS
  Administered 2015-10-02: 2 [IU] via SUBCUTANEOUS
  Administered 2015-10-02: 15 [IU] via SUBCUTANEOUS
  Administered 2015-10-02: 5 [IU] via SUBCUTANEOUS
  Administered 2015-10-02: 8 [IU] via SUBCUTANEOUS

## 2015-09-29 MED ORDER — LORAZEPAM 2 MG/ML IJ SOLN
0.5000 mg | Freq: Once | INTRAMUSCULAR | Status: AC
  Administered 2015-09-29: 0.5 mg via INTRAVENOUS
  Filled 2015-09-29: qty 1

## 2015-09-29 NOTE — Progress Notes (Signed)
This RN attempted to explain to the patient that because she has been found on the floor multiple times, it is considered an unwitnessed fall and she therefore needs to call before she ambulates and the bed alarm needs to stay on. The pt responded "I've told you I didn't fall" and "even if that alarm is on it's not going to stop me from getting up if I want to." This RN continued to explain why she needs these interventions, however, the patient continued to be uncooperative.

## 2015-09-29 NOTE — Progress Notes (Signed)
Per MD order patient to have nasogastric tube placed today. Patient received education regarding NG tube per hospitalist and primary nurse. Patient then refuses to have NG tube placed and patient states "I don't want anything that will be uncomfortable". Nurse explained to patient that the procedure is uncomfortable only as tube is being put down and patient will have a lot of relief once tube is in stomach. Patient states "I just want my ativan first and we can try later". MD aware. Will continue with plan of care and try to attempt to place NG tube later.

## 2015-09-29 NOTE — Progress Notes (Signed)
Patient is refusing for her blood sugar to be checked this morning. Patient educated by nurse and nurse tech on the importance of checking her blood sugar. Patient still refuses and wants to sleep. Will try to check CBG later on.

## 2015-09-29 NOTE — Progress Notes (Signed)
Patient refusing to have lantus given at this time. Patient states "I haven't had anything to eat". Spoke with patient about getting IV compazine first around 1150, so that she can try to eat something for lunch. Patient agrees with plan and will allow nurse to give the lantus once she has eaten something. Patient still refusing to take PO medications due to nausea and vomiting. Will continue to monitor.

## 2015-09-29 NOTE — Progress Notes (Signed)
[facility] PROGRESS NOTE  Carolyn Ayala ZOX:[id number] DOB: 1976-05-21 DOA: 09/24/2015 PCP: No primary care provider on file.  Interim Summary Carolyn Ayala is a 40 year old female with a history of insulin-dependent diabetes mellitus admitted to the medicine service on 09/24/2015 when she presented with complaints of nausea and vomiting, able to keep by mouth down. Lab work in the emergency department showed she was in diabetic ketoacidosis. She was started on the DKA protocol treated with IV insulin and IV fluids. Her DKA subsequently resolved. Hospitalization has been complicated by intractable nausea vomiting despite resolution of DKA which makes me believe that symptoms could be secondary to diabetic gastroparesis flareup. Abdominal film did not reveal evidence of obstruction. She was started on scheduled Reglan 10 mg 3 times a day. Due to persistence of symptoms NG tube was ordered on 09/29/2015.  Assessment/Plan: 1. Diabetic ketoacidosis -Carolyn Ayala is a 40 year old female with a history of insulin dependent diabetes mellitus, who reported taking  Lantus 55 units daily at bedtime and NovoLog 15 units 3 times a day with meals, resented with multiple episodes of nausea and vomiting. -She was found to be hyperglycemic with labs showing diabetic ketoacidosis. -Lab work revealing resolution to anion gap metabolic acidosis. -09/29/2015 during this hospitalization she has refused insulin along with her other medications including psych meds. Psychiatry has been consulted as well as concern for the possibility of underlying mood disorder leading to refusal of meds -I spoke at length about her insulin regimen being an insulin-dependent diabetic. -Continue Lantus 60 units daily with sliding scale coverage.  2.  Nausea/vomiting -Initially I thought this could be related to diabetic ketoacidosis, although symptoms persist despite resolution of DKA.  -This may be related to diabetic  gastroparesis flareup. -Plan to continue scheduled Reglan 10 mg IV 3 times a day -She had multiple episodes of nausea and vomiting today, will place NG tube for decompression. Abdominal x-ray performed 09/27/2015 did not reveal evidence of obstruction.  3.  Leukocytosis. -Lab work showing a white count of 28,700 -I had suspected this was related to diabetic ketoacidosis. She has been afebrile in the emergency department. Urine chest x-ray did not reveal evidence of infection. Abdominal x-ray was negative. -Will monitor off of antimicrobial therapy -Labs on 09/29/2015 showing white count of 8800.  4. Hypertension. -She'll be made nothing by mouth with NG tube placement due to intractable nausea vomiting likely secondary to diabetic gastroparesis flareup. -Discontinue lisinopril and place her on as needed hydralazine 10 mg every 4 hours for systolic blood pressures greater than 165  5.  History of depression with psychotic features. -As previously required inpatient psychiatric treatment. -Continue lithium and Abilify -Psychiatry consulted  6.  Hypokalemia. -Potassium improving to 3.6 after replacement.  7.  Major depression -She has a history of depression with psychosis requiring inpatient psychiatric treatment. I spoke to her about her mood she states she has been feeling sad and depressed lately. She states having depression since the death of her mother. During this hospitalization she has refused insulin and psych meds on several occasions as well as allowing lab work and Accu-Cheks to be done. I get the sense that she is not interested in participating in her own plan of care regarding diabetic management, which could be stemming from her underlying mood disorder. -Psychiatry has been consulted for further recommendations on this.  Code Status: Full code Family Communication:  Disposition Plan: Psychiatry consulted   HPI/Subjective: Patient actually having nausea vomiting during  my evaluation, stated throwing  up all night long.  Objective: Filed Vitals:   09/28/15 2116 09/29/15 0444  BP: 120/72 139/97  Pulse: 117 111  Temp: 98.6 F (37 C) 98.3 F (36.8 C)  Resp: 18 20    Intake/Output Summary (Last 24 hours) at 09/29/15 1627 Last data filed at 09/28/15 1800  Gross per 24 hour  Intake      0 ml  Output      0 ml  Net      0 ml   Filed Weights   09/25/15 1419 09/26/15 1703  Weight: 85 kg (187 lb 6.3 oz) 87.4 kg (192 lb 10.9 oz)    Exam:   General:  Patient having a flat affect, appears depressed  Cardiovascular: Tachycardic, regular rate rhythm normal S1-S2  Respiratory: Normal respiratory effort  Abdomen: Soft nontender nondistended  Musculoskeletal: No edema  Data Reviewed: Basic Metabolic Panel:  Recent Labs Lab 09/24/15 1526  09/25/15 1604 09/25/15 1948 09/26/15 0513 09/27/15 0815 09/29/15 0529  NA  --   < > 141 139 141 136 136  K  --   < > 3.1* 2.9* 2.9* 3.3* 3.6  CL  --   < > 111 111 111 106 102  CO2  --   < > 17* 17* 19* 13* 21*  GLUCOSE  --   < > 136* 192* 175* 362* 210*  BUN  --   < > 9 7 <5* 8 11  CREATININE  --   < > 0.75 0.71 0.65 0.77 0.70  CALCIUM  --   < > 8.3* 8.3* 8.4* 8.3* 8.7*  MG 2.1  --   --   --   --   --   --   < > = values in this interval not displayed. Liver Function Tests:  Recent Labs Lab 09/24/15 1526  AST 42*  ALT 129*  ALKPHOS 193*  BILITOT 1.7*  PROT 8.8*  ALBUMIN 4.7    Recent Labs Lab 09/24/15 1526  LIPASE 12   No results for input(s): AMMONIA in the last 168 hours. CBC:  Recent Labs Lab 09/24/15 1421 09/24/15 2214 09/27/15 0815 09/29/15 0529  WBC 21.7* 28.7* 11.1* 8.8  HGB 12.5 12.4 13.1 14.0  HCT 39.8 40.2 39.7 42.4  MCV 81.9 83.9 79.1 76.1*  PLT 431* 359 262 355   Cardiac Enzymes: No results for input(s): CKTOTAL, CKMB, CKMBINDEX, TROPONINI in the last 168 hours. BNP (last 3 results) No results for input(s): BNP in the last 8760 hours.  ProBNP (last 3  results) No results for input(s): PROBNP in the last 8760 hours.  CBG:  Recent Labs Lab 09/28/15 1140 09/28/15 1513 09/28/15 1721 09/28/15 2048 09/29/15 1209  GLUCAP 317* 271* 249* 193* 210*    Recent Results (from the past 240 hour(s))  MRSA PCR Screening     Status: Abnormal   Collection Time: 09/25/15  2:22 PM  Result Value Ref Range Status   MRSA by PCR POSITIVE (A) NEGATIVE Final    Comment:        The GeneXpert MRSA Assay (FDA approved for NASAL specimens only), is one component of a comprehensive MRSA colonization surveillance program. It is not intended to diagnose MRSA infection nor to guide or monitor treatment for MRSA infections. RESULT CALLED TO, READ BACK BY AND VERIFIED WITH: ZOE SUGGS,RN [id number] @ 1547 BY J SCOTTON      Studies: No results found.  Scheduled Meds: . ARIPiprazole  20 mg Oral Daily  . Chlorhexidine Gluconate Cloth  6 each Topical Q0600  . enoxaparin (LOVENOX) injection  40 mg Subcutaneous Q24H  . gi cocktail  30 mL Oral Once  . insulin aspart  0-15 Units Subcutaneous TID WC  . insulin aspart  0-5 Units Subcutaneous QHS  . insulin glargine  60 Units Subcutaneous Daily  . lisinopril  10 mg Oral Daily  . lithium carbonate  300 mg Oral Q12H  . metoCLOPramide (REGLAN) injection  10 mg Intravenous 3 times per day  . mirtazapine  15 mg Oral QHS  . mupirocin ointment  1 application Nasal BID  . simvastatin  20 mg Oral q1800  . sodium chloride flush  3 mL Intravenous Q12H   Continuous Infusions:    Principal Problem:   Depression, major, recurrent, severe with psychosis ([facility]) Active Problems:   DKA (diabetic ketoacidoses) ([facility])   IDDM (insulin dependent diabetes mellitus) ([facility])   Uncontrolled diabetes mellitus with neurologic complication, with long-term current use of insulin ([facility])   HTN (hypertension)    Time spent: 35 min   Carolyn Ayala  [facility] Pager [phone]. If 7PM-7AM, please contact night-coverage at  [email or web address], password [email or web address] 09/29/2015, 4:27 PM  LOS: 5 days

## 2015-09-29 NOTE — Hospital Discharge Follow-Up (Signed)
Attempted again to meet with the patient to discuss plans for follow up after discharge. She stated, " I'm really not feeling good, come back tomorrow."  Will continue to follow her hospital course.

## 2015-09-30 DIAGNOSIS — E1310 Other specified diabetes mellitus with ketoacidosis without coma: Principal | ICD-10-CM

## 2015-09-30 DIAGNOSIS — K3184 Gastroparesis: Secondary | ICD-10-CM | POA: Insufficient documentation

## 2015-09-30 DIAGNOSIS — E1110 Type 2 diabetes mellitus with ketoacidosis without coma: Secondary | ICD-10-CM | POA: Insufficient documentation

## 2015-09-30 DIAGNOSIS — R1084 Generalized abdominal pain: Secondary | ICD-10-CM

## 2015-09-30 DIAGNOSIS — I10 Essential (primary) hypertension: Secondary | ICD-10-CM

## 2015-09-30 DIAGNOSIS — R109 Unspecified abdominal pain: Secondary | ICD-10-CM | POA: Insufficient documentation

## 2015-09-30 DIAGNOSIS — F333 Major depressive disorder, recurrent, severe with psychotic symptoms: Secondary | ICD-10-CM | POA: Diagnosis not present

## 2015-09-30 DIAGNOSIS — E876 Hypokalemia: Secondary | ICD-10-CM

## 2015-09-30 LAB — GLUCOSE, CAPILLARY
Glucose-Capillary: 272 mg/dL — ABNORMAL HIGH (ref 65–99)
Glucose-Capillary: 259 mg/dL — ABNORMAL HIGH (ref 65–99)
Glucose-Capillary: 185 mg/dL — ABNORMAL HIGH (ref 65–99)
Glucose-Capillary: 235 mg/dL — ABNORMAL HIGH (ref 65–99)
Glucose-Capillary: 297 mg/dL — ABNORMAL HIGH (ref 65–99)
Glucose-Capillary: 186 mg/dL — ABNORMAL HIGH (ref 65–99)

## 2015-09-30 LAB — CBC
HCT: 41.5 % (ref 36.0–46.0)
Hemoglobin: 13.5 g/dL (ref 12.0–15.0)
MCH: 25.2 pg — ABNORMAL LOW (ref 26.0–34.0)
MCHC: 32.5 g/dL (ref 30.0–36.0)
MCV: 77.6 fL — ABNORMAL LOW (ref 78.0–100.0)
Platelets: 346 10*3/uL (ref 150–400)
RBC: 5.35 MIL/uL — ABNORMAL HIGH (ref 3.87–5.11)
RDW: 16.8 % — ABNORMAL HIGH (ref 11.5–15.5)
WBC: 8.2 10*3/uL (ref 4.0–10.5)

## 2015-09-30 LAB — BASIC METABOLIC PANEL
Anion gap: 11 (ref 5–15)
BUN: 11 mg/dL (ref 6–20)
CO2: 20 mmol/L — ABNORMAL LOW (ref 22–32)
Calcium: 8.2 mg/dL — ABNORMAL LOW (ref 8.9–10.3)
Chloride: 105 mmol/L (ref 101–111)
Creatinine, Ser: 0.71 mg/dL (ref 0.44–1.00)
GFR calc Af Amer: >60 mL/min (ref >60)
GFR calc non Af Amer: >60 mL/min (ref >60)
Glucose, Bld: 290 mg/dL — ABNORMAL HIGH (ref 65–99)
Potassium: 2.8 mmol/L — ABNORMAL LOW (ref 3.5–5.1)
Sodium: 136 mmol/L (ref 135–145)

## 2015-09-30 MED ORDER — POTASSIUM CHLORIDE 10 MEQ/100ML IV SOLN
10.0000 meq | INTRAVENOUS | Status: AC
  Administered 2015-09-30 (×2): 10 meq via INTRAVENOUS
  Filled 2015-09-30 (×2): qty 100

## 2015-09-30 MED ORDER — POTASSIUM CHLORIDE 10 MEQ/100ML IV SOLN
10.0000 meq | INTRAVENOUS | Status: AC
  Administered 2015-09-30 (×4): 10 meq via INTRAVENOUS
  Filled 2015-09-30 (×4): qty 100

## 2015-09-30 MED ORDER — HYDRALAZINE HCL 20 MG/ML IJ SOLN: 10.0000 mg | INTRAMUSCULAR | Status: DC | PRN

## 2015-09-30 MED ORDER — MAGNESIUM SULFATE 2 GM/50ML IV SOLN
2.0000 g | Freq: Once | INTRAVENOUS | Status: AC
  Administered 2015-09-30: 2 g via INTRAVENOUS
  Filled 2015-09-30: qty 50

## 2015-09-30 MED ORDER — METOCLOPRAMIDE HCL 5 MG/ML IJ SOLN
10.0000 mg | Freq: Four times a day (QID) | INTRAMUSCULAR | Status: DC
  Administered 2015-09-30 – 2015-10-01 (×4): 10 mg via INTRAVENOUS
  Filled 2015-09-30 (×8): qty 2

## 2015-09-30 MED ORDER — BISACODYL 10 MG RE SUPP
10.0000 mg | Freq: Every day | RECTAL | Status: DC | PRN
  Administered 2015-09-30: 10 mg via RECTAL
  Filled 2015-09-30: qty 1

## 2015-09-30 NOTE — Progress Notes (Signed)
Pt. C/o nausea during shift scheduled reglan given and compazine given this am X 1. No vomiting noted. Pt up oob and ambulated in hallway with assistance this am tolerated well. Currently resting in bed in no acute distress.

## 2015-09-30 NOTE — Progress Notes (Signed)
Pt refused PO abilify and lithium this AM citing nausea though she repeatedly asks for something to eat.  Pt medicated for nausea and this writer offered PO meds later in the AM after pt had eaten some full liquids for breakfast. Pt continues to refuse PO meds at this time.

## 2015-09-30 NOTE — Hospital Discharge Follow-Up (Signed)
This CM unable to meet with the patient today to [facility].  Will continue to follow her hospital course.

## 2015-09-30 NOTE — Consult Note (Signed)
[facility] Face-to-Face Psychiatry Consult follow-up  Reason for Consult:  Depression and non compliance with treatment Referring Physician:  Dr. Coralyn Pear Patient Identification: Rebbecca Osuna MRN:  [id number] Principal Diagnosis: Depression, major, recurrent, severe with psychosis ([place]) Diagnosis:   Patient Active Problem List   Diagnosis Date Noted  . DKA (diabetic ketoacidoses) ([place]) [E13.10] 09/24/2015  . HTN (hypertension) [I10] 09/24/2015  . Diabetes mellitus without complication ([place]) [I96.7]   . Hyperlipidemia [E78.5] 08/10/2015  . Alcohol use disorder, moderate, dependence ([place]) [F10.20] 08/10/2015  . Neuropathic pain [M79.2] 08/08/2015  . Seasonal allergic rhinitis [J30.2] 08/08/2015  . Depression, major, recurrent, severe with psychosis ([place]) [F33.3] 08/06/2015  . Uncontrolled diabetes mellitus with neurologic complication, with long-term current use of insulin ([facility]) [E11.49, Z79.4, E11.65]   . Uncontrolled diabetes mellitus with diabetic neuropathy, with long-term current use of insulin ([place]) [E11.40, Z79.4, E11.65] 04/21/2015  . Generalized anxiety disorder [F41.1] 02/13/2013  . Chronic posttraumatic stress disorder [F43.12] 02/13/2013  . Substance induced mood disorder ([place]) [F19.94] 02/08/2013  . IDDM (insulin dependent diabetes mellitus) ([place]) [E11.9, Z79.4] 02/08/2013    Total Time spent with patient: 30 minutes  Subjective:   Yamel Bale is a 40 y.o. female patient admitted with depression and non compliance with medication therapy.  HPI:  Minola Guin is a 40 y.o. Female, case discussed with Dr. Coralyn Pear for this face-to-face psychiatric consultation and evaluation of increased symptoms of depression and noncompliant with her medication management for depression and also insulin-dependent diabetes mellitus. Patient has been suffering with nausea and vomiting's for the several days due to poor diabetic control and diabetic gastroparesis and  neuropathy. Patient stated that she has been noncompliant with her medications and outpatient medication management since discharged from the hospital. Patient has been depressed more frequently since her mother passed away about 2 months ago and has noncompliant with her medication management because of no funds or insurance. Patient admitted to the hospital with nausea and vomiting's and increased heart rate. Patient is hoping she can go to [place] and living with her niece and help her with her 2 kids. Patient denies current symptoms of suicidal/homicidal ideation, intention or plans. Staff RN reported patient has been partially compliant with her insulin therapy. Patient having difficulty with food and fluid intake by mouth. Patient is not reliable during this evaluation. Patient reportedly stayed sober since last hospitalization at [facility] 2 months ago and seems to be proud when saying it with a big smile. Her recent hospitalization about a week ago shows her alcohol level is 138. Past Psychiatric History: Patient is known to the psychiatric hospital and psychiatric consultation. She was admitted at [facility] with major depressive disorder with psychosis, last admission February 2017.   Interval history: Patient seen in her room for psychiatric consultation follow-up today. Patient has been in her bed reportedly having a hard time to eat and drink secondary to diabetic gastroparesis. Patient has chronic symptoms of depression and anxiety but denies active suicidal/homicidal ideation, intention or plans. Patient is willing to be compliant with medication as much as she can secondary to GI symptoms. Patient is currently medically sick and has been taking IV medication. Patient has no evidence of psychosis. Patient is willing to continue her outpatient medication management when medically stable. Patient mood is reactive and had appropriate smile and occasional bright affect.   Risk to Self: Is  patient at risk for suicide?: No Risk to Others:   Prior Inpatient Therapy:   Prior Outpatient Therapy:  Past Medical History:  Past Medical History  Diagnosis Date  . Mental disorder   . Depression   . Diabetes mellitus without complication ([place])   . Hypertension     Past Surgical History  Procedure Laterality Date  . Cesarean section     Family History:  Family History  Problem Relation Age of Onset  . Depression Mother    Family Psychiatric  History: Patient has no family members in [place] and her mother passed away 2 months ago with multiple medical problems. Social History:  History  Alcohol Use  . Yes    Comment: daily     History  Drug Use No    Social History   Social History  . Marital Status: Single    Spouse Name: N/A  . Number of Children: N/A  . Years of Education: N/A   Social History Main Topics  . Smoking status: Never Smoker   . Smokeless tobacco: Current User  . Alcohol Use: Yes     Comment: daily  . Drug Use: No  . Sexual Activity: No   Other Topics Concern  . None   Social History Narrative   ** Merged History Encounter **       Additional Social History:    Allergies:  No Known Allergies  Labs:  Results for orders placed or performed during the hospital encounter of 09/24/15 (from the past 48 hour(s))  Glucose, capillary     Status: Abnormal   Collection Time: 09/28/15 11:40 AM  Result Value Ref Range   Glucose-Capillary 317 (H) 65 - 99 mg/dL  Glucose, capillary     Status: Abnormal   Collection Time: 09/28/15  3:13 PM  Result Value Ref Range   Glucose-Capillary 271 (H) 65 - 99 mg/dL  Glucose, capillary     Status: Abnormal   Collection Time: 09/28/15  5:21 PM  Result Value Ref Range   Glucose-Capillary 249 (H) 65 - 99 mg/dL  Glucose, capillary     Status: Abnormal   Collection Time: 09/28/15  8:48 PM  Result Value Ref Range   Glucose-Capillary 193 (H) 65 - 99 mg/dL  Basic metabolic panel     Status: Abnormal    Collection Time: 09/29/15  5:29 AM  Result Value Ref Range   Sodium 136 135 - 145 mmol/L   Potassium 3.6 3.5 - 5.1 mmol/L   Chloride 102 101 - 111 mmol/L   CO2 21 (L) 22 - 32 mmol/L   Glucose, Bld 210 (H) 65 - 99 mg/dL   BUN 11 6 - 20 mg/dL   Creatinine, Ser 0.70 0.44 - 1.00 mg/dL   Calcium 8.7 (L) 8.9 - 10.3 mg/dL   GFR calc non Af Amer >60 >60 mL/min   GFR calc Af Amer >60 >60 mL/min    Comment: (NOTE) The eGFR has been calculated using the CKD EPI equation. This calculation has not been validated in all clinical situations. eGFR's persistently <60 mL/min signify possible Chronic Kidney Disease.    Anion gap 13 5 - 15  CBC     Status: Abnormal   Collection Time: 09/29/15  5:29 AM  Result Value Ref Range   WBC 8.8 4.0 - 10.5 K/uL   RBC 5.57 (H) 3.87 - 5.11 MIL/uL   Hemoglobin 14.0 12.0 - 15.0 g/dL   HCT 42.4 36.0 - 46.0 %   MCV 76.1 (L) 78.0 - 100.0 fL   MCH 25.1 (L) 26.0 - 34.0 pg   MCHC 33.0 30.0 - 36.0  g/dL   RDW 16.9 (H) 11.5 - 15.5 %   Platelets 355 150 - 400 K/uL  Glucose, capillary     Status: Abnormal   Collection Time: 09/29/15 12:09 PM  Result Value Ref Range   Glucose-Capillary 210 (H) 65 - 99 mg/dL  Glucose, capillary     Status: Abnormal   Collection Time: 09/29/15  5:19 PM  Result Value Ref Range   Glucose-Capillary 303 (H) 65 - 99 mg/dL  Glucose, capillary     Status: Abnormal   Collection Time: 09/29/15 10:30 PM  Result Value Ref Range   Glucose-Capillary 192 (H) 65 - 99 mg/dL  Basic metabolic panel     Status: Abnormal   Collection Time: 09/30/15  5:26 AM  Result Value Ref Range   Sodium 136 135 - 145 mmol/L   Potassium 2.8 (L) 3.5 - 5.1 mmol/L    Comment: RESULT REPEATED AND VERIFIED DELTA CHECK NOTED    Chloride 105 101 - 111 mmol/L   CO2 20 (L) 22 - 32 mmol/L   Glucose, Bld 290 (H) 65 - 99 mg/dL   BUN 11 6 - 20 mg/dL   Creatinine, Ser 0.71 0.44 - 1.00 mg/dL   Calcium 8.2 (L) 8.9 - 10.3 mg/dL   GFR calc non Af Amer >60 >60 mL/min   GFR  calc Af Amer >60 >60 mL/min    Comment: (NOTE) The eGFR has been calculated using the CKD EPI equation. This calculation has not been validated in all clinical situations. eGFR's persistently <60 mL/min signify possible Chronic Kidney Disease.    Anion gap 11 5 - 15  CBC     Status: Abnormal   Collection Time: 09/30/15  5:26 AM  Result Value Ref Range   WBC 8.2 4.0 - 10.5 K/uL   RBC 5.35 (H) 3.87 - 5.11 MIL/uL   Hemoglobin 13.5 12.0 - 15.0 g/dL   HCT 41.5 36.0 - 46.0 %   MCV 77.6 (L) 78.0 - 100.0 fL   MCH 25.2 (L) 26.0 - 34.0 pg   MCHC 32.5 30.0 - 36.0 g/dL   RDW 16.8 (H) 11.5 - 15.5 %   Platelets 346 150 - 400 K/uL  Glucose, capillary     Status: Abnormal   Collection Time: 09/30/15  5:28 AM  Result Value Ref Range   Glucose-Capillary 272 (H) 65 - 99 mg/dL  Glucose, capillary     Status: Abnormal   Collection Time: 09/30/15  7:41 AM  Result Value Ref Range   Glucose-Capillary 259 (H) 65 - 99 mg/dL    Current Facility-Administered Medications  Medication Dose Route Frequency Provider Last Rate Last Dose  . 0.9 %  sodium chloride infusion   Intravenous Continuous Kelvin Cellar, MD 100 mL/hr at 09/29/15 1717    . ARIPiprazole (ABILIFY) tablet 20 mg  20 mg Oral Daily Kelvin Cellar, MD   20 mg at 09/30/15 0949  . bisacodyl (DULCOLAX) suppository 10 mg  10 mg Rectal Daily PRN Barton Dubois, MD      . Chlorhexidine Gluconate Cloth 2 % PADS 6 each  6 each Topical Q0600 Kelvin Cellar, MD   6 each at 09/30/15 0527  . enoxaparin (LOVENOX) injection 40 mg  40 mg Subcutaneous Q24H Kelvin Cellar, MD   40 mg at 09/29/15 2231  . fluticasone (FLONASE) 50 MCG/ACT nasal spray 1 spray  1 spray Each Nare Daily PRN Kelvin Cellar, MD      . guaiFENesin-dextromethorphan (ROBITUSSIN DM) 100-10 MG/5ML syrup 5 mL  5 mL  Oral Q4H PRN Kelvin Cellar, MD      . hydrALAZINE (APRESOLINE) injection 10 mg  10 mg Intravenous Q4H PRN Barton Dubois, MD      . hydrOXYzine (ATARAX/VISTARIL) tablet 50 mg   50 mg Oral Q6H PRN Kelvin Cellar, MD   50 mg at 09/26/15 0414  . insulin aspart (novoLOG) injection 0-15 Units  0-15 Units Subcutaneous Q4H Kelvin Cellar, MD   8 Units at 09/30/15 [phone]  . insulin glargine (LANTUS) injection 60 Units  60 Units Subcutaneous Daily Kelvin Cellar, MD   60 Units at 09/30/15 0949  . lithium carbonate (LITHOBID) CR tablet 300 mg  300 mg Oral Q12H Kelvin Cellar, MD   300 mg at 09/30/15 0949  . LORazepam (ATIVAN) injection 0.5 mg  0.5 mg Intravenous Q6H PRN Kelvin Cellar, MD   0.5 mg at 09/30/15 0640  . magnesium sulfate IVPB 2 g 50 mL  2 g Intravenous Once Barton Dubois, MD      . metoCLOPramide (REGLAN) injection 10 mg  10 mg Intravenous Q6H Barton Dubois, MD      . mirtazapine (REMERON) tablet 15 mg  15 mg Oral QHS Ambrose Finland, MD   15 mg at 09/29/15 2231  . mupirocin ointment (BACTROBAN) 2 % 1 application  1 application Nasal BID Kelvin Cellar, MD   1 application at 99/37/16 [phone]  . ondansetron (ZOFRAN) injection 4 mg  4 mg Intravenous Q6H PRN Kelvin Cellar, MD   4 mg at 09/30/15 1015  . phenol (CHLORASEPTIC) mouth spray 1 spray  1 spray Mouth/Throat PRN Kelvin Cellar, MD      . potassium chloride 10 mEq in 100 mL IVPB  10 mEq Intravenous Q1 Hr x 4 Gardiner Barefoot, NP   10 mEq at 09/30/15 1109  . potassium chloride 10 mEq in 100 mL IVPB  10 mEq Intravenous Q1 Hr x 2 Barton Dubois, MD      . prochlorperazine (COMPAZINE) injection 10 mg  10 mg Intravenous Q6H PRN Kelvin Cellar, MD   10 mg at 09/30/15 0640  . promethazine (PHENERGAN) tablet 12.5 mg  12.5 mg Oral Q6H PRN Kelvin Cellar, MD   12.5 mg at 09/28/15 2011  . simvastatin (ZOCOR) tablet 20 mg  20 mg Oral q1800 Kelvin Cellar, MD   20 mg at 09/25/15 0201  . sodium chloride flush (NS) 0.9 % injection 3 mL  3 mL Intravenous Q12H Kelvin Cellar, MD   3 mL at 09/28/15 2207    Musculoskeletal: Strength & Muscle Tone: decreased Gait & Station: unable to stand Patient leans:  N/A  Psychiatric Specialty Exam: ROS   Blood pressure 145/100, pulse 113, temperature 97.4 F (36.3 C), temperature source Oral, resp. rate 20, height 5' 2"  (1.575 m), weight 87.4 kg (192 lb 10.9 oz), last menstrual period 09/24/2015, SpO2 100 %.Body mass index is 35.23 kg/(m^2).  General Appearance: Guarded  Eye Contact::  Good  Speech:  Clear and Coherent  Volume:  Normal  Mood:  Depressed  Affect:  Appropriate and Congruent  Thought Process:  Coherent and Goal Directed  Orientation:  Full (Time, Place, and Person)  Thought Content:  WDL  Suicidal Thoughts:  No  Homicidal Thoughts:  No  Memory:  Immediate;   Fair Recent;   Fair  Judgement:  Impaired  Insight:  Shallow  Psychomotor Activity:  Decreased  Concentration:  Fair  Recall:  [place]  Language: Good  Akathisia:  Negative  Handed:  Right  AIMS (if  indicated):     Assets:  Communication Skills Desire for Improvement Housing Leisure Time Resilience Transportation  ADL's:  Impaired  Cognition: WNL  Sleep:      Treatment Plan Summary: Patient has no safety concerns as she denies current suicidal/homicidal ideation and psychosis. Patient has capacity to make her own medical decisions and living arrangements based on my evaluation  Continue Abilify 20 mg daily for depression and psychosis,  Continue hydroxyzine 50 mg for anxiety as needed,  Continue Lithobid 300 mg twice daily for mood swings Continue Remeron 15 mg at bedtime for insomnia  Patient will be referred to the outpatient medication management when medical stable and cleared Appreciate psychiatric consultation and follow up as clinically required Please contact [phone] or [phone] if needs further assistance  Disposition: Patient does not meet criteria for psychiatric inpatient admission. Supportive therapy provided about ongoing stressors.  Durward Parcel., MD 09/30/2015 11:21 AM

## 2015-09-30 NOTE — Progress Notes (Signed)
[facility] PROGRESS NOTE  Cicely Ortner Donlan ZOX:[id number] DOB: 05-Aug-1975 DOA: 09/24/2015 PCP: No primary care provider on file.  Interim Summary Mrs Velasquez is a 40 year old female with a history of insulin-dependent diabetes mellitus admitted to the medicine service on 09/24/2015 when she presented with complaints of nausea and vomiting, able to keep by mouth down. Lab work in the emergency department showed she was in diabetic ketoacidosis. She was started on the DKA protocol treated with IV insulin and IV fluids. Her DKA subsequently resolved. Hospitalization has been complicated by intractable nausea vomiting despite resolution of DKA which makes me believe that symptoms could be secondary to diabetic gastroparesis flareup. Abdominal film did not reveal evidence of obstruction. She was started on scheduled Reglan 10 mg 3 times a day. Due to persistence of symptoms NG tube was ordered on 09/29/2015; but patient declined it. Currently with improvement in her symptoms and asking for food. Will advance diet and follow clinical response. Hopefully home in 1-2 days.  Assessment/Plan: 1. Diabetic ketoacidosis -Mrs Fojtik is a 40 year old female with a history of insulin dependent diabetes mellitus, who reported taking  Lantus 55 units daily at bedtime and NovoLog 15 units 3 times a day with meals; Who presented with multiple episodes of nausea and vomiting. -She was found to be hyperglycemic with labs showing diabetic ketoacidosis. -DKA resolved now -patient educated about medication compliance and following low carb diet, to assist controlling her condition  -I spoke at length about her insulin regimen being an insulin-dependent diabetic. -will Continue Lantus 60 units daily with sliding scale coverage. -patient eating poor  2.  Nausea/vomiting -Initially I thought this could be related to diabetic ketoacidosis, although symptoms persist despite resolution of DKA.  -Most likely  related to diabetic gastroparesis flareup. -Plan to continue scheduled Reglan 10 mg IV 4 times a day -has decline NGT and no distension on exam -will advance diet and follow tolerance  3.  Leukocytosis. -Lab work showing a white count of 28,700 -I had suspected this was related to diabetic ketoacidosis. She has been afebrile in the emergency department. Urine and chest x-ray did not reveal evidence of infection. Abdominal x-ray was negative as well. -WBC's is back to WNL now -no antibiotics needed   4. Hypertension. -continue hydralazine  -once able to tolerate PO's will resume home antihypertensive drugs   5.  History of depression with psychotic features. -As previously required inpatient psychiatric treatment. -Psychiatry consultation appreciated -will continue home antidepressant/mood stabilizer medications  6.  Hypokalemia. -continue replacement as needed -due to GI loses and poor PO intake  7.  Major depression -She has a history of depression with psychosis requiring inpatient psychiatric treatment. I spoke to her about her mood she states she has been feeling sad and depressed lately. She states having depression since the death of her mother. During this hospitalization she has refused insulin and psych meds on several occasions as well as allowing lab work and Accu-Cheks to be done.  -psychiatry has seen patient and recommended continue current antidepressant regimen and medications to stabilize mood swings -patient not needing inpatient treatment and capable of making her own decisions    Code Status: Full code Family Communication: no family at bedside. Disposition Plan: Psychiatry consulted and safe for discharge; will advance diet and follow capacity to keep medications down. Diabetes coordinator consulted. Hopefully home in 1-2 days   HPI/Subjective: Patient asking for food; reports no further vomiting, even still has some intermittent nausea. Refused NGT placement  on 4/18.  No BM in the last 5 days.  Objective: Filed Vitals:   09/30/15 0636 09/30/15 1428  BP: 145/100 107/58  Pulse: 113 103  Temp: 97.4 F (36.3 C) 99.2 F (37.3 C)  Resp: 20 20    Intake/Output Summary (Last 24 hours) at 09/30/15 1843 Last data filed at 09/30/15 1551  Gross per 24 hour  Intake 3201.67 ml  Output      0 ml  Net 3201.67 ml   Filed Weights   09/25/15 1419 09/26/15 1703  Weight: 85 kg (187 lb 6.3 oz) 87.4 kg (192 lb 10.9 oz)    Exam:   General:  Patient having a flat affect, denies SI and/or hallucinations. No further episodes of vomiting, is hungry and will like her diet advanced.  Cardiovascular: mild tachycardia, regular rhythm, normal S1-S2  Respiratory: Normal respiratory effort  Abdomen: Soft nontender nondistended  Musculoskeletal: No edema  Data Reviewed: Basic Metabolic Panel:  Recent Labs Lab 09/24/15 1526  09/25/15 1948 09/26/15 0513 09/27/15 0815 09/29/15 0529 09/30/15 0526  NA  --   < > 139 141 136 136 136  K  --   < > 2.9* 2.9* 3.3* 3.6 2.8*  CL  --   < > 111 111 106 102 105  CO2  --   < > 17* 19* 13* 21* 20*  GLUCOSE  --   < > 192* 175* 362* 210* 290*  BUN  --   < > 7 <5* 8 11 11   CREATININE  --   < > 0.71 0.65 0.77 0.70 0.71  CALCIUM  --   < > 8.3* 8.4* 8.3* 8.7* 8.2*  MG 2.1  --   --   --   --   --   --   < > = values in this interval not displayed. Liver Function Tests:  Recent Labs Lab 09/24/15 1526  AST 42*  ALT 129*  ALKPHOS 193*  BILITOT 1.7*  PROT 8.8*  ALBUMIN 4.7    Recent Labs Lab 09/24/15 1526  LIPASE 12   CBC:  Recent Labs Lab 09/24/15 1421 09/24/15 2214 09/27/15 0815 09/29/15 0529 09/30/15 0526  WBC 21.7* 28.7* 11.1* 8.8 8.2  HGB 12.5 12.4 13.1 14.0 13.5  HCT 39.8 40.2 39.7 42.4 41.5  MCV 81.9 83.9 79.1 76.1* 77.6*  PLT 431* 359 262 355 346   CBG:  Recent Labs Lab 09/29/15 2230 09/30/15 0528 09/30/15 0741 09/30/15 1203 09/30/15 1659  GLUCAP 192* 272* 259* 185* 235*     Recent Results (from the past 240 hour(s))  MRSA PCR Screening     Status: Abnormal   Collection Time: 09/25/15  2:22 PM  Result Value Ref Range Status   MRSA by PCR POSITIVE (A) NEGATIVE Final    Comment:        The GeneXpert MRSA Assay (FDA approved for NASAL specimens only), is one component of a comprehensive MRSA colonization surveillance program. It is not intended to diagnose MRSA infection nor to guide or monitor treatment for MRSA infections. RESULT CALLED TO, READ BACK BY AND VERIFIED WITH: ZOE SUGGS,RN [id number] @ 1547 BY J SCOTTON      Studies: No results found.  Scheduled Meds: . ARIPiprazole  20 mg Oral Daily  . Chlorhexidine Gluconate Cloth  6 each Topical Q0600  . enoxaparin (LOVENOX) injection  40 mg Subcutaneous Q24H  . insulin aspart  0-15 Units Subcutaneous Q4H  . insulin glargine  60 Units Subcutaneous Daily  . lithium carbonate  300 mg Oral  Q12H  . metoCLOPramide (REGLAN) injection  10 mg Intravenous Q6H  . mirtazapine  15 mg Oral QHS  . mupirocin ointment  1 application Nasal BID  . simvastatin  20 mg Oral q1800  . sodium chloride flush  3 mL Intravenous Q12H   Continuous Infusions: . sodium chloride 100 mL/hr at 09/30/15 1432    Principal Problem:   Depression, major, recurrent, severe with psychosis ([facility]) Active Problems:   IDDM (insulin dependent diabetes mellitus) ([facility])   Uncontrolled diabetes mellitus with neurologic complication, with long-term current use of insulin ([facility])   DKA (diabetic ketoacidoses) ([facility])   HTN (hypertension)    Time spent: 30 min   Vassie Loll  [facility] Pager [phone]. If 7PM-7AM, please contact night-coverage at [email or web address], password [email or web address] 09/30/2015, 6:43 PM  LOS: 6 days

## 2015-09-30 NOTE — Progress Notes (Signed)
Pt refusing to allow staff to keep her door open, which is needed due to her noncompliance with staying in the bed.

## 2015-09-30 NOTE — Progress Notes (Signed)
Date:  September 30, 2015 Chart reviewed for concurrent status and case management needs. Will continue to follow patient for changes and needs: nausea continues ngtube and iv flds,npo Carolyn Smilinghonda Cincere Ayala, BSN, RN, [place]   [phone]

## 2015-09-30 NOTE — Progress Notes (Signed)
Inpatient Diabetes Program Recommendations  AACE/ADA: New Consensus Statement on Inpatient Glycemic Control (2015)  Target Ranges:  Prepandial:   less than 140 mg/dL      Peak postprandial:   less than 180 mg/dL (1-2 hours)      Critically ill patients:  140 - 180 mg/dL   Review of Glycemic Control  Results for Carolyn Ayala, Carolyn Ayala (MRN [id number]) as of 09/30/2015 12:36  Ref. Range 09/29/2015 17:19 09/29/2015 22:30 09/30/2015 05:28 09/30/2015 07:41 09/30/2015 12:03  Glucose-Capillary Latest Ref Range: 65-99 mg/dL 914303 (H) 782192 (H) 956272 (H) 259 (H) 185 (H)  Results for Carolyn Ayala, Carolyn Ayala (MRN [id number]) as of 09/30/2015 12:36  Ref. Range 08/21/2015 14:43  Hemoglobin A1C Latest Ref Range: 4.8-5.6 % 11.0 (H)   Continues with poor po intake.   Due to underlying psychiatric illness she has refused insulin on several occasions making control of blood sugars difficult. Inpatient Diabetes Team has spoken to pt numerous times regarding her diabetes and poor glycemic control.      Inpatient Diabetes Program Recommendations:    Add meal coverage insulin when pt eating CHO mod med diet. Begin at Novolog 4 units tidwc.  Will continue to follow. Thank you. Ailene Ardshonda Reesa Gotschall, RD, LDN, CDE Inpatient Diabetes Coordinator [phone]

## 2015-10-01 ENCOUNTER — Encounter (HOSPITAL_COMMUNITY): Payer: Self-pay

## 2015-10-01 ENCOUNTER — Ambulatory Visit: Payer: Self-pay | Admitting: Family Medicine

## 2015-10-01 DIAGNOSIS — R1013 Epigastric pain: Secondary | ICD-10-CM

## 2015-10-01 DIAGNOSIS — E1165 Type 2 diabetes mellitus with hyperglycemia: Secondary | ICD-10-CM

## 2015-10-01 DIAGNOSIS — Z794 Long term (current) use of insulin: Secondary | ICD-10-CM

## 2015-10-01 DIAGNOSIS — E1149 Type 2 diabetes mellitus with other diabetic neurological complication: Secondary | ICD-10-CM

## 2015-10-01 LAB — GLUCOSE, CAPILLARY
Glucose-Capillary: 202 mg/dL — ABNORMAL HIGH (ref 65–99)
Glucose-Capillary: 198 mg/dL — ABNORMAL HIGH (ref 65–99)
Glucose-Capillary: 173 mg/dL — ABNORMAL HIGH (ref 65–99)
Glucose-Capillary: 334 mg/dL — ABNORMAL HIGH (ref 65–99)
Glucose-Capillary: 450 mg/dL — ABNORMAL HIGH (ref 65–99)

## 2015-10-01 MED ORDER — INSULIN ASPART 100 UNIT/ML ~~LOC~~ SOLN
18.0000 [IU] | Freq: Once | SUBCUTANEOUS | Status: AC
  Administered 2015-10-01: 18 [IU] via SUBCUTANEOUS

## 2015-10-01 MED ORDER — METOCLOPRAMIDE HCL 10 MG PO TABS
10.0000 mg | ORAL_TABLET | Freq: Three times a day (TID) | ORAL | Status: DC
Start: 2015-10-01 — End: 2015-10-02
  Administered 2015-10-01 – 2015-10-02 (×5): 10 mg via ORAL
  Filled 2015-10-01 (×7): qty 1

## 2015-10-01 MED ORDER — INSULIN GLARGINE 100 UNIT/ML ~~LOC~~ SOLN
64.0000 [IU] | Freq: Two times a day (BID) | SUBCUTANEOUS | Status: DC
  Administered 2015-10-01 – 2015-10-02 (×2): 64 [IU] via SUBCUTANEOUS
  Filled 2015-10-01 (×3): qty 0.64

## 2015-10-01 MED ORDER — ONDANSETRON 4 MG PO TBDP
8.0000 mg | ORAL_TABLET | Freq: Three times a day (TID) | ORAL | Status: DC | PRN
  Administered 2015-10-01 – 2015-10-02 (×3): 8 mg via ORAL
  Filled 2015-10-01 (×3): qty 2

## 2015-10-01 MED ORDER — PANTOPRAZOLE SODIUM 40 MG PO TBEC
40.0000 mg | DELAYED_RELEASE_TABLET | Freq: Two times a day (BID) | ORAL | Status: DC
  Administered 2015-10-01 – 2015-10-02 (×2): 40 mg via ORAL
  Filled 2015-10-01 (×3): qty 1

## 2015-10-01 MED ORDER — INSULIN GLARGINE 100 UNIT/ML ~~LOC~~ SOLN
60.0000 [IU] | Freq: Two times a day (BID) | SUBCUTANEOUS | Status: DC
  Filled 2015-10-01: qty 0.6

## 2015-10-01 NOTE — Progress Notes (Signed)
On night shift, pt tolerated 3 cups of soup without any vomiting.

## 2015-10-01 NOTE — Progress Notes (Addendum)
Refused to let IV team start a new IV for nausea medicine and Ativan. Will have day shift attempt

## 2015-10-01 NOTE — Progress Notes (Signed)
Refused labs.

## 2015-10-01 NOTE — Progress Notes (Signed)
[facility] PROGRESS NOTE  Carolyn Ayala EAV:[id number] DOB: 01/04/1976 DOA: 09/24/2015 PCP: No primary care provider on file.  Interim Summary Carolyn Ayala is a 40 year old female with a history of insulin-dependent diabetes mellitus admitted to the medicine service on 09/24/2015 when she presented with complaints of nausea and vomiting, able to keep by mouth down. Lab work in the emergency department showed she was in diabetic ketoacidosis. She was started on the DKA protocol treated with IV insulin and IV fluids. Her DKA subsequently resolved. Hospitalization has been complicated by intractable nausea vomiting despite resolution of DKA which makes me believe that symptoms could be secondary to diabetic gastroparesis flareup. Abdominal film did not reveal evidence of obstruction. She was started on scheduled Reglan 10 mg 3 times a day. Due to persistence of symptoms NG tube was ordered on 09/29/2015; but patient declined it. Currently with improvement in her symptoms and asking for food. Will advance diet and follow clinical response. Hopefully home in 1-2 days.  Assessment/Plan: 1. Diabetic ketoacidosis -Carolyn Ayala is a 40 year old female with a history of insulin dependent diabetes mellitus, who reported taking  Lantus 55 units daily at bedtime and NovoLog 15 units 3 times a day with meals; Who presented with multiple episodes of nausea and vomiting. -She was found to be hyperglycemic with labs showing diabetic ketoacidosis. -DKA resolved now; CBG's rising  -patient educated about medication compliance and following low carb diet, to assist controlling her condition  -I spoke at length about her insulin regimen being an insulin-dependent diabetic. -will increase Lantus to 60 units BID; patient is now having her diet advance. -patient eating poor  2.  Nausea/vomiting -Initially I thought this could be related to diabetic ketoacidosis, although symptoms persist despite  resolution of DKA.  -Most likely related to diabetic gastroparesis flareup. -Plan to continue scheduled Reglan 4 times a day and PPI -has decline NGT and no distension on exam -will continue advancing diet and follow tolerance  3.  Leukocytosis. -Lab work showing a white count of 28,700 -I had suspected this was related to diabetic ketoacidosis. She has been afebrile in the emergency department. Urine and chest x-ray did not reveal evidence of infection. Abdominal x-ray was negative as well. -WBC's is back to WNL now -no antibiotics needed   4. Hypertension. -continue hydralazine  -once able to tolerate PO's will resume home antihypertensive drugs   5.  History of depression with psychotic features. -As previously required inpatient psychiatric treatment. -Psychiatry consultation appreciated -will continue home antidepressant/mood stabilizer medications  6.  Hypokalemia. -continue replacement as needed -due to GI loses and poor PO intake -will check BMET in am  7.  Major depression -She has a history of depression with psychosis requiring inpatient psychiatric treatment. I spoke to her about her mood she states she has been feeling sad and depressed lately. She states having depression since the death of her mother. During this hospitalization she has refused insulin and psych meds on several occasions as well as allowing lab work and Accu-Cheks to be done.  -psychiatry has seen patient and recommended continue current antidepressant regimen and medications to stabilize mood swings -patient not needing inpatient treatment and capable of making her own decisions    Code Status: Full code Family Communication: no family at bedside. Disposition Plan: Psychiatry consulted and safe for discharge; will advance diet and follow capacity to keep medications down. Diabetes coordinator consulted. Hopefully home in 1-2 days   HPI/Subjective: Patient asking for food; reports no further  vomiting, even still continue to experience nausea. Afebrile and denies CP.  Objective: Filed Vitals:   10/01/15 0515 10/01/15 1517  BP: 133/83 111/63  Pulse: 103 112  Temp: 98.5 F (36.9 C) 98.4 F (36.9 C)  Resp: 20 20    Intake/Output Summary (Last 24 hours) at 10/01/15 1828 Last data filed at 10/01/15 1500  Gross per 24 hour  Intake   1626 ml  Output      0 ml  Net   1626 ml   Filed Weights   09/25/15 1419 09/26/15 1703  Weight: 85 kg (187 lb 6.3 oz) 87.4 kg (192 lb 10.9 oz)    Exam:   General:  Patient very interactive today, denies SI and/or hallucinations. No further episodes of vomiting, is hungry and will like her diet advance further. Still nauseated.  Cardiovascular: mild tachycardia, regular rhythm, normal S1-S2  Respiratory: Normal respiratory effort  Abdomen: Soft nontender nondistended  Musculoskeletal: No edema  Data Reviewed: Basic Metabolic Panel:  Recent Labs Lab 09/25/15 1948 09/26/15 0513 09/27/15 0815 09/29/15 0529 09/30/15 0526  NA 139 141 136 136 136  K 2.9* 2.9* 3.3* 3.6 2.8*  CL 111 111 106 102 105  CO2 17* 19* 13* 21* 20*  GLUCOSE 192* 175* 362* 210* 290*  BUN 7 <5* CREATININE 0.71 0.65 0.77 0.70 0.71  CALCIUM 8.3* 8.4* 8.3* 8.7* 8.2*   CBC:  Recent Labs Lab 09/24/15 2214 09/27/15 0815 09/29/15 0529 09/30/15 0526  WBC 28.7* 11.1* 8.8 8.2  HGB 12.4 13.1 14.0 13.5  HCT 40.2 39.7 42.4 41.5  MCV 83.9 79.1 76.1* 77.6*  PLT 359 262 355 346   CBG:  Recent Labs Lab 09/30/15 2322 10/01/15 0513 10/01/15 0749 10/01/15 1222 10/01/15 1740  GLUCAP 186* 202* 198* 173* 334*    Recent Results (from the past 240 hour(s))  MRSA PCR Screening     Status: Abnormal   Collection Time: 09/25/15  2:22 PM  Result Value Ref Range Status   MRSA by PCR POSITIVE (A) NEGATIVE Final    Comment:        The GeneXpert MRSA Assay (FDA approved for NASAL specimens only), is one component of a comprehensive MRSA  colonization surveillance program. It is not intended to diagnose MRSA infection nor to guide or monitor treatment for MRSA infections. RESULT CALLED TO, READ BACK BY AND VERIFIED WITH: ZOE SUGGS,RN [id number] @ 1547 BY J SCOTTON      Studies: No results found.  Scheduled Meds: . ARIPiprazole  20 mg Oral Daily  . Chlorhexidine Gluconate Cloth  6 each Topical Q0600  . enoxaparin (LOVENOX) injection  40 mg Subcutaneous Q24H  . insulin aspart  0-15 Units Subcutaneous Q4H  . insulin glargine  60 Units Subcutaneous BID  . lithium carbonate  300 mg Oral Q12H  . metoCLOPramide  10 mg Oral TID AC & HS  . mirtazapine  15 mg Oral QHS  . mupirocin ointment  1 application Nasal BID  . simvastatin  20 mg Oral q1800  . sodium chloride flush  3 mL Intravenous Q12H   Continuous Infusions: . sodium chloride Stopped (10/01/15 0500)    Principal Problem:   Depression, major, recurrent, severe with psychosis (HCC) Active Problems:   IDDM (insulin dependent diabetes mellitus) (HCC)   Uncontrolled diabetes mellitus with neurologic complication, with long-term current use of insulin (HCC)   DKA (diabetic ketoacidoses) (HCC)   HTN (hypertension)   Abdominal pain   Diabetic ketoacidosis without coma associated  with type 2 diabetes mellitus (HCC)   Gastroparesis   Hypokalemia    Time spent: 30 min   Vassie LollMadera, Ira Busbin  [facility] Pager [phone]. If 7PM-7AM, please contact night-coverage at [email or web address], password [email or web address] 10/01/2015, 6:28 PM  LOS: 7 days

## 2015-10-02 ENCOUNTER — Telehealth: Payer: Self-pay

## 2015-10-02 DIAGNOSIS — E0810 Diabetes mellitus due to underlying condition with ketoacidosis without coma: Secondary | ICD-10-CM

## 2015-10-02 DIAGNOSIS — F333 Major depressive disorder, recurrent, severe with psychotic symptoms: Secondary | ICD-10-CM | POA: Diagnosis not present

## 2015-10-02 LAB — BASIC METABOLIC PANEL
Anion gap: 10 (ref 5–15)
BUN: 8 mg/dL (ref 6–20)
CO2: 20 mmol/L — ABNORMAL LOW (ref 22–32)
Calcium: 8.9 mg/dL (ref 8.9–10.3)
Chloride: 107 mmol/L (ref 101–111)
Creatinine, Ser: 0.74 mg/dL (ref 0.44–1.00)
GFR calc Af Amer: >60 mL/min (ref >60)
GFR calc non Af Amer: >60 mL/min (ref >60)
Glucose, Bld: 215 mg/dL — ABNORMAL HIGH (ref 65–99)
Potassium: 3.3 mmol/L — ABNORMAL LOW (ref 3.5–5.1)
Sodium: 137 mmol/L (ref 135–145)

## 2015-10-02 LAB — GLUCOSE, CAPILLARY
Glucose-Capillary: 147 mg/dL — ABNORMAL HIGH (ref 65–99)
Glucose-Capillary: 227 mg/dL — ABNORMAL HIGH (ref 65–99)
Glucose-Capillary: 252 mg/dL — ABNORMAL HIGH (ref 65–99)
Glucose-Capillary: 371 mg/dL — ABNORMAL HIGH (ref 65–99)

## 2015-10-02 MED ORDER — PROMETHAZINE HCL 12.5 MG PO TABS: 12.5000 mg | ORAL_TABLET | Freq: Four times a day (QID) | ORAL | Status: DC | PRN

## 2015-10-02 MED ORDER — INSULIN GLARGINE 100 UNIT/ML ~~LOC~~ SOLN: 60.0000 [IU] | Freq: Two times a day (BID) | SUBCUTANEOUS | Status: DC

## 2015-10-02 MED ORDER — PANTOPRAZOLE SODIUM 40 MG PO TBEC: 40.0000 mg | DELAYED_RELEASE_TABLET | Freq: Two times a day (BID) | ORAL | Status: DC

## 2015-10-02 MED ORDER — INSULIN ASPART 100 UNIT/ML ~~LOC~~ SOLN: 8.0000 [IU] | Freq: Three times a day (TID) | SUBCUTANEOUS | Status: DC

## 2015-10-02 MED ORDER — GABAPENTIN 300 MG PO CAPS: 300.0000 mg | ORAL_CAPSULE | Freq: Three times a day (TID) | ORAL | Status: DC

## 2015-10-02 MED ORDER — METOCLOPRAMIDE HCL 10 MG PO TABS: 10.0000 mg | ORAL_TABLET | Freq: Three times a day (TID) | ORAL | Status: DC

## 2015-10-02 MED ORDER — ARIPIPRAZOLE 20 MG PO TABS
20.0000 mg | ORAL_TABLET | Freq: Every day | ORAL | Status: DC
Start: 2015-10-02 — End: 2015-10-08

## 2015-10-02 MED ORDER — TRAMADOL HCL 50 MG PO TABS: 50.0000 mg | ORAL_TABLET | Freq: Four times a day (QID) | ORAL | Status: DC | PRN

## 2015-10-02 MED FILL — PANTOPRAZOLE SOD DR 40 MG T: 40 | 30 days supply | Qty: 60 | Fill #0

## 2015-10-02 MED FILL — traMADol HCL 50 MG TABS: 50 | 5 days supply | Qty: 20 | Fill #0

## 2015-10-02 NOTE — Discharge Summary (Signed)
Physician Discharge Summary  Carolyn Ayala WUJ:[id number] DOB: 1976/03/04 DOA: 09/24/2015  PCP: No primary care provider on file.  Admit date: 09/24/2015 Discharge date: 10/02/2015  Time spent: 35 minutes  Recommendations for Outpatient Follow-up:  1. Repeat BMET to follow electrolytes and renal function 2. Reassess BP and adjust antihypertensive drugs as needed  3. Close follow up to CBG's and further adjustment to her insulin therapy as needed   Discharge Diagnoses:  Principal Problem:   Depression, major, recurrent, severe with psychosis (HCC) Active Problems:   IDDM (insulin dependent diabetes mellitus) (HCC)   Uncontrolled diabetes mellitus with neurologic complication, with long-term current use of insulin (HCC)   DKA (diabetic ketoacidoses) (HCC)   HTN (hypertension)   Abdominal pain   Diabetic ketoacidosis without coma associated with type 2 diabetes mellitus (HCC)   Gastroparesis   Hypokalemia   Discharge Condition: stable and improved. No further nausea/vomtiing and with resolved DKA. Discharge home with info for resources/assistance programs and also with appointment set up at [facility] to establish care and follow up.  Diet recommendation: heart healthy diet and modified carbohydrates   Filed Weights   09/25/15 1419 09/26/15 1703  Weight: 85 kg (187 lb 6.3 oz) 87.4 kg (192 lb 10.9 oz)    History of present illness:  As per H&P written by Dr. Vanessa Barbara on 09/24/15 40 y.o. female with a past medical history of insulin-dependent diabetes mellitus presented to the emergency department with complaints of nausea and vomiting. In the past 24 hours she reports developing multiple episodes of nausea and vomiting (approximately 8 episodes this morning) unable to keep by mouth down associated with feeling ill, having polyuria and polydipsia. She also reports "my heart was racing." She states being on insulin for her diabetes and has not missed any doses. She tells me  that her blood sugars had been under control lately, however, in the emergency department she was found to have a blood sugar of 498 with an anion gap of 28, bicarbonate of 8, with presence of ketones on urinalysis. Furthermore, she was recently seen in the emergency department on 09/21/2015 for hyperglycemia having an alcohol level of 138. In the emergency room but today she was started on IV fluids and IV insulin.  Hospital Course:  1. Diabetic ketoacidosis -Carolyn Ayala is a 40 year old female with a history of insulin dependent diabetes mellitus, who reported taking Lantus 55 units daily at bedtime and NovoLog 15 units 3 times a day with meals; Who presented with multiple episodes of nausea and vomiting. -She was found to be hyperglycemic with labs showing diabetic ketoacidosis. -DKA resolved now; CBG's stable at discharge -patient educated about medication compliance and following low carb diet, to assist controlling her condition  -I spoke at length about her insulin regimen being an insulin-dependent diabetic. -will increase Lantus to 60 units BID; patient is now having her diet advance and eating well  2. Nausea/vomiting -Initially I thought this could be related to diabetic ketoacidosis, although symptoms persist despite resolution of DKA.  -Most likely related to diabetic gastroparesis flareup. -Plan to continue scheduled Reglan 4 times a day and PPI -advise to follow multiple small meals per day  3. Leukocytosis. -Lab work showing a white count of 28,700 -I had suspected this was related to diabetic ketoacidosis. She has been afebrile in the emergency department. Urine and chest x-ray did not reveal evidence of infection. Abdominal x-ray was negative as well. -WBC's is back to WNL now -no antibiotics needed  4. Hypertension. -now tolerating PO intake and medications -will resume home antihypertensive agents  5. History of depression with psychotic features. -She has  a history of depression with psychosis requiring inpatient psychiatric treatment. I spoke to her about her mood she states she has been feeling sad and depressed lately. She states having depression since the death of her mother. During this hospitalization she has refused insulin and psych meds on several occasions as well as allowing lab work and Accu-Cheks to be done.  -psychiatry has seen patient and recommended continue current antidepressant regimen and medications to stabilize mood swings -patient not needing inpatient treatment and is capable of making her own decisions   6. Hypokalemia. -continue replacement as needed -due to GI loses and poor PO intake -3.3 at discharge and back on lisinopril (which will control potassium level)  7.  GERD -continue PPI   Procedures:  See below for x-ray reports   Consultations:  Psychiatry   Diabetes Coordinator   Discharge Exam: Filed Vitals:   10/01/15 2140 10/02/15 0454  BP: 111/68 109/69  Pulse: 110 103  Temp: 98.9 F (37.2 C) 98.6 F (37 C)  Resp: 19 20   2. General: Patient very interactive today, denies SI and/or hallucinations. No further episodes of vomiting and denies nausea as well. Has kept food and medications down for the over 24 hours. 3. Cardiovascular: RRR, Normal S1-S2, no rubs or gallops 4. Respiratory: Normal respiratory effort 5. Abdomen: Soft, nontender, nondistended, positive BS 6. Musculoskeletal: No edema   Discharge Instructions   Discharge Instructions    Discharge instructions    Complete by:  As directed   Take medications as prescribed Keep yourself well hydrated Please be compliant with outpatient follow up visit and medications Follow a modified carbohydrates diet          Current Discharge Medication List    START taking these medications   Details  metoCLOPramide (REGLAN) 10 MG tablet Take 1 tablet (10 mg total) by mouth 4 (four) times daily -  before meals and at bedtime. Qty:  120 tablet, Refills: 1    pantoprazole (PROTONIX) 40 MG tablet Take 1 tablet (40 mg total) by mouth 2 (two) times daily with a meal. Qty: 60 tablet, Refills: 1    promethazine (PHENERGAN) 12.5 MG tablet Take 1 tablet (12.5 mg total) by mouth every 6 (six) hours as needed for nausea or vomiting. Qty: 30 tablet, Refills: 0      CONTINUE these medications which have CHANGED   Details  ARIPiprazole (ABILIFY) 20 MG tablet Take 1 tablet (20 mg total) by mouth daily. Qty: 30 tablet, Refills: 0    gabapentin (NEURONTIN) 300 MG capsule Take 1 capsule (300 mg total) by mouth 3 (three) times daily. Qty: 90 capsule, Refills: 0    insulin aspart (NOVOLOG) 100 UNIT/ML injection Inject 8 Units into the skin 3 (three) times daily with meals. Qty: 10 mL, Refills: 3    insulin glargine (LANTUS) 100 UNIT/ML injection Inject 0.6 mLs (60 Units total) into the skin 2 (two) times daily. Qty: 10 mL, Refills: 11    traMADol (ULTRAM) 50 MG tablet Take 1 tablet (50 mg total) by mouth every 6 (six) hours as needed for severe pain. Qty: 20 tablet, Refills: 0      CONTINUE these medications which have NOT CHANGED   Details  hydrOXYzine (ATARAX/VISTARIL) 50 MG tablet Take 1 tablet (50 mg total) by mouth every 6 (six) hours as needed for anxiety. Qty: 30 tablet,  Refills: 0    lisinopril (PRINIVIL,ZESTRIL) 10 MG tablet Take 10 mg by mouth daily.    mirtazapine (REMERON) 45 MG tablet Take 1 tablet (45 mg total) by mouth at bedtime. Qty: 30 tablet, Refills: 0    Multiple Vitamin (MULTIVITAMIN WITH MINERALS) TABS tablet Take 1 tablet by mouth daily.    simvastatin (ZOCOR) 20 MG tablet Take 1 tablet (20 mg total) by mouth daily at 6 PM. Qty: 30 tablet, Refills: 0    Thiamine Mononitrate (VITAMIN B1 PO) Take 1 tablet by mouth daily.    fluticasone (FLONASE) 50 MCG/ACT nasal spray Place 1 spray into both nostrils daily as needed for allergies or rhinitis. Qty: 1 g, Refills: 2    lithium carbonate (LITHOBID)  300 MG CR tablet Take 1 tablet (300 mg total) by mouth every 12 (twelve) hours. Qty: 60 tablet, Refills: 0      STOP taking these medications     metFORMIN (GLUCOPHAGE) 1000 MG tablet        No Known Allergies Follow-up Information    Follow up with [facility]. Schedule an appointment as soon as possible for a visit in 1 week.   Contact information:   [address] [place] [address] [phone]      The results of significant diagnostics from this hospitalization (including imaging, microbiology, ancillary and laboratory) are listed below for reference.    Significant Diagnostic Studies: Dg Chest 2 View  09/24/2015  CLINICAL DATA:  Nausea.  Cough EXAM: CHEST  2 VIEW COMPARISON:  07/03/2015 FINDINGS: Normal heart size and mediastinal contours. No acute infiltrate or edema. No effusion or pneumothorax. No acute osseous findings. IMPRESSION: Normal chest Electronically Signed   By: Marnee Spring M.D.   On: 09/24/2015 16:35   Dg Abd 1 View  09/27/2015  CLINICAL DATA:  Abdominal pain EXAM: ABDOMEN - 1 VIEW COMPARISON:  07/03/2015 FINDINGS: Scattered large and small bowel gas is noted. No free air is seen. No abnormal mass or abnormal calcifications are noted. Calcified uterine fibroid is again seen and stable. No bony abnormality is noted. IMPRESSION: No acute abnormality seen. Electronically Signed   By: Alcide Clever M.D.   On: 09/27/2015 16:00   US Abdomen Limited Ruq  09/24/2015  CLINICAL DATA:  Nausea for 1 day EXAM: US ABDOMEN LIMITED - RIGHT UPPER QUADRANT COMPARISON:  None. FINDINGS: Gallbladder: Mild gallbladder sludge. No stone. No wall thickening or focal tenderness. Common bile duct: Diameter: 3 mm Liver: No focal lesion identified. Within normal limits in parenchymal echogenicity. IMPRESSION: Small volume gallbladder sludge.  Otherwise negative. Electronically Signed   By: Marnee Spring M.D.   On: 09/24/2015 16:59     Microbiology: Recent Results (from the past 240 hour(s))  MRSA PCR Screening     Status: Abnormal   Collection Time: 09/25/15  2:22 PM  Result Value Ref Range Status   MRSA by PCR POSITIVE (A) NEGATIVE Final    Comment:        The GeneXpert MRSA Assay (FDA approved for NASAL specimens only), is one component of a comprehensive MRSA colonization surveillance program. It is not intended to diagnose MRSA infection nor to guide or monitor treatment for MRSA infections. RESULT CALLED TO, READ BACK BY AND VERIFIED WITH: ZOE SUGGS,RN [id number] @ 1547 BY J SCOTTON      Labs: Basic Metabolic Panel:  Recent Labs Lab 09/26/15 0513 09/27/15 0815 09/29/15 0529 09/30/15 0526 10/02/15 0533  NA 141 136 136  136 137  K 2.9* 3.3* 3.6 2.8* 3.3*  CL 111 106 102 105 107  CO2 19* 13* 21* 20* 20*  GLUCOSE 175* 362* 210* 290* 215*  BUN <5* 8 11 11 8   CREATININE 0.65 0.77 0.70 0.71 0.74  CALCIUM 8.4* 8.3* 8.7* 8.2* 8.9   CBC:  Recent Labs Lab 09/27/15 0815 09/29/15 0529 09/30/15 0526  WBC 11.1* 8.8 8.2  HGB 13.1 14.0 13.5  HCT 39.7 42.4 41.5  MCV 79.1 76.1* 77.6*  PLT 262 355 346   CBG:  Recent Labs Lab 10/01/15 1740 10/01/15 2111 10/02/15 0011 10/02/15 0451 10/02/15 0743  GLUCAP 334* 450* 371* 227* 147*    Signed:  Vassie LollMadera, Damari Suastegui MD.  [facility] 10/02/2015, 10:39 AM

## 2015-10-02 NOTE — Telephone Encounter (Signed)
This Case Manager placed call to listed number #[phone] to inform patient of the follow-up and medical management provided with [facility]; however, was informed this is incorrect number for patient.

## 2015-10-02 NOTE — Consult Note (Signed)
[facility] Face-to-Face Psychiatry Consult follow-up  Reason for Consult:  Depression and non compliance with treatment Referring Physician:  Dr. Coralyn Pear Patient Identification: Carolyn Ayala MRN:  [id number] Principal Diagnosis: Depression, major, recurrent, severe with psychosis ([place]) Diagnosis:   Patient Active Problem List   Diagnosis Date Noted  . Abdominal pain [R10.9]   . Diabetic ketoacidosis without coma associated with type 2 diabetes mellitus ([place]) [E13.10]   . Gastroparesis [K31.84]   . Hypokalemia [E87.6]   . DKA (diabetic ketoacidoses) ([place]) [E13.10] 09/24/2015  . HTN (hypertension) [I10] 09/24/2015  . Diabetes mellitus without complication ([place]) [K55.3]   . Hyperlipidemia [E78.5] 08/10/2015  . Alcohol use disorder, moderate, dependence ([place]) [F10.20] 08/10/2015  . Neuropathic pain [M79.2] 08/08/2015  . Seasonal allergic rhinitis [J30.2] 08/08/2015  . Depression, major, recurrent, severe with psychosis ([place]) [F33.3] 08/06/2015  . Uncontrolled diabetes mellitus with neurologic complication, with long-term current use of insulin (HCC) [E11.49, Z79.4, E11.65]   . Uncontrolled diabetes mellitus with diabetic neuropathy, with long-term current use of insulin ([place]) [E11.40, Z79.4, E11.65] 04/21/2015  . Generalized anxiety disorder [F41.1] 02/13/2013  . Chronic posttraumatic stress disorder [F43.12] 02/13/2013  . Substance induced mood disorder ([place]) [F19.94] 02/08/2013  . IDDM (insulin dependent diabetes mellitus) ([place]) [E11.9, Z79.4] 02/08/2013    Total Time spent with patient: 20 minutes  Subjective:   Carolyn Ayala is a 40 y.o. female patient admitted with depression and non compliance with medication therapy.  HPI:  Carolyn Ayala is a 40 y.o. Female, case discussed with Dr. Coralyn Pear for this face-to-face psychiatric consultation and evaluation of increased symptoms of depression and noncompliant with her medication management for depression and also  insulin-dependent diabetes mellitus. Patient has been suffering with nausea and vomiting's for the several days due to poor diabetic control and diabetic gastroparesis and neuropathy. Patient stated that she has been noncompliant with her medications and outpatient medication management since discharged from the hospital. Patient has been depressed more frequently since her mother passed away about 2 months ago and has noncompliant with her medication management because of no funds or insurance. Patient admitted to the hospital with nausea and vomiting's and increased heart rate. Patient is hoping she can go to [place] and living with her niece and help her with her 2 kids. Patient denies current symptoms of suicidal/homicidal ideation, intention or plans. Staff RN reported patient has been partially compliant with her insulin therapy. Patient having difficulty with food and fluid intake by mouth. Patient is not reliable during this evaluation. Patient reportedly stayed sober since last hospitalization at [facility] 2 months ago and seems to be proud when saying it with a big smile. Her recent hospitalization about a week ago shows her alcohol level is 138. Past Psychiatric History: Patient is known to the psychiatric hospital and psychiatric consultation. She was admitted at [facility] with major depressive disorder with psychosis, last admission February 2017.   Interval history: Patient seen in her room for psychiatric consultation and has no complaints today. Patient stated that she has been feeling better with her current medication regimen and stated that her physician told her she can be discharged home today. Patient has denies active suicidal/homicidal ideation, intention or plans. Patient has no evidence of psychosis.  patient has been contracting for safety. Patient is willing to continue her outpatient medication management when medically stable. Patient mood is reactive and had appropriate  smile and has bright affect.   Risk to Self: Is patient at risk for suicide?: No  Risk to Others:   Prior Inpatient Therapy:   Prior Outpatient Therapy:    Past Medical History:  Past Medical History  Diagnosis Date  . Mental disorder   . Depression   . Diabetes mellitus without complication ([place])   . Hypertension   . Noncompliance     Past Surgical History  Procedure Laterality Date  . Cesarean section     Family History:  Family History  Problem Relation Age of Onset  . Depression Mother    Family Psychiatric  History: Patient has no family members in [place] and her mother passed away 2 months ago with multiple medical problems. Social History:  History  Alcohol Use  . Yes    Comment: daily     History  Drug Use No    Social History   Social History  . Marital Status: Single    Spouse Name: N/A  . Number of Children: N/A  . Years of Education: N/A   Social History Main Topics  . Smoking status: Never Smoker   . Smokeless tobacco: Current User  . Alcohol Use: Yes     Comment: daily  . Drug Use: No  . Sexual Activity: No   Other Topics Concern  . None   Social History Narrative   ** Merged History Encounter **       Additional Social History:    Allergies:  No Known Allergies  Labs:  Results for orders placed or performed during the hospital encounter of 09/24/15 (from the past 48 hour(s))  Glucose, capillary     Status: Abnormal   Collection Time: 09/30/15  4:59 PM  Result Value Ref Range   Glucose-Capillary 235 (H) 65 - 99 mg/dL  Glucose, capillary     Status: Abnormal   Collection Time: 09/30/15  7:56 PM  Result Value Ref Range   Glucose-Capillary 297 (H) 65 - 99 mg/dL  Glucose, capillary     Status: Abnormal   Collection Time: 09/30/15 11:22 PM  Result Value Ref Range   Glucose-Capillary 186 (H) 65 - 99 mg/dL  Glucose, capillary     Status: Abnormal   Collection Time: 10/01/15  5:13 AM  Result Value Ref Range   Glucose-Capillary  202 (H) 65 - 99 mg/dL  Glucose, capillary     Status: Abnormal   Collection Time: 10/01/15  7:49 AM  Result Value Ref Range   Glucose-Capillary 198 (H) 65 - 99 mg/dL   Comment 1 Notify RN    Comment 2 Document in Chart   Glucose, capillary     Status: Abnormal   Collection Time: 10/01/15 12:22 PM  Result Value Ref Range   Glucose-Capillary 173 (H) 65 - 99 mg/dL   Comment 1 Notify RN    Comment 2 Document in Chart   Glucose, capillary     Status: Abnormal   Collection Time: 10/01/15  5:40 PM  Result Value Ref Range   Glucose-Capillary 334 (H) 65 - 99 mg/dL  Glucose, capillary     Status: Abnormal   Collection Time: 10/01/15  9:11 PM  Result Value Ref Range   Glucose-Capillary 450 (H) 65 - 99 mg/dL  Glucose, capillary     Status: Abnormal   Collection Time: 10/02/15 12:11 AM  Result Value Ref Range   Glucose-Capillary 371 (H) 65 - 99 mg/dL   Comment 1 Notify RN    Comment 2 Document in Chart   Glucose, capillary     Status: Abnormal   Collection Time: 10/02/15  4:51 AM  Result Value Ref Range   Glucose-Capillary 227 (H) 65 - 99 mg/dL   Comment 1 Notify RN    Comment 2 Document in Chart   Basic metabolic panel     Status: Abnormal   Collection Time: 10/02/15  5:33 AM  Result Value Ref Range   Sodium 137 135 - 145 mmol/L   Potassium 3.3 (L) 3.5 - 5.1 mmol/L   Chloride 107 101 - 111 mmol/L   CO2 20 (L) 22 - 32 mmol/L   Glucose, Bld 215 (H) 65 - 99 mg/dL   BUN 8 6 - 20 mg/dL   Creatinine, Ser 0.74 0.44 - 1.00 mg/dL   Calcium 8.9 8.9 - 10.3 mg/dL   GFR calc non Af Amer >60 >60 mL/min   GFR calc Af Amer >60 >60 mL/min    Comment: (NOTE) The eGFR has been calculated using the CKD EPI equation. This calculation has not been validated in all clinical situations. eGFR's persistently <60 mL/min signify possible Chronic Kidney Disease.    Anion gap 10 5 - 15  Glucose, capillary     Status: Abnormal   Collection Time: 10/02/15  7:43 AM  Result Value Ref Range    Glucose-Capillary 147 (H) 65 - 99 mg/dL  Glucose, capillary     Status: Abnormal   Collection Time: 10/02/15 11:31 AM  Result Value Ref Range   Glucose-Capillary 252 (H) 65 - 99 mg/dL    Current Facility-Administered Medications  Medication Dose Route Frequency Provider Last Rate Last Dose  . 0.9 %  sodium chloride infusion   Intravenous Continuous Kelvin Cellar, MD   Stopped at 10/01/15 0500  . ARIPiprazole (ABILIFY) tablet 20 mg  20 mg Oral Daily Kelvin Cellar, MD   20 mg at 10/02/15 1133  . bisacodyl (DULCOLAX) suppository 10 mg  10 mg Rectal Daily PRN Barton Dubois, MD   10 mg at 09/30/15 1726  . Chlorhexidine Gluconate Cloth 2 % PADS 6 each  6 each Topical Q0600 Kelvin Cellar, MD   6 each at 10/02/15 0600  . enoxaparin (LOVENOX) injection 40 mg  40 mg Subcutaneous Q24H Kelvin Cellar, MD   40 mg at 10/01/15 2107  . fluticasone (FLONASE) 50 MCG/ACT nasal spray 1 spray  1 spray Each Nare Daily PRN Kelvin Cellar, MD      . guaiFENesin-dextromethorphan (ROBITUSSIN DM) 100-10 MG/5ML syrup 5 mL  5 mL Oral Q4H PRN Kelvin Cellar, MD      . hydrALAZINE (APRESOLINE) injection 10 mg  10 mg Intravenous Q4H PRN Barton Dubois, MD      . hydrOXYzine (ATARAX/VISTARIL) tablet 50 mg  50 mg Oral Q6H PRN Kelvin Cellar, MD   50 mg at 10/01/15 1854  . insulin aspart (novoLOG) injection 0-15 Units  0-15 Units Subcutaneous Q4H Kelvin Cellar, MD   8 Units at 10/02/15 1210  . insulin glargine (LANTUS) injection 64 Units  64 Units Subcutaneous BID Gardiner Barefoot, NP   64 Units at 10/02/15 1134  . lithium carbonate (LITHOBID) CR tablet 300 mg  300 mg Oral Q12H Kelvin Cellar, MD   300 mg at 10/02/15 1133  . metoCLOPramide (REGLAN) tablet 10 mg  10 mg Oral TID AC & HS Barton Dubois, MD   10 mg at 10/02/15 1133  . mirtazapine (REMERON) tablet 15 mg  15 mg Oral QHS Ambrose Finland, MD   15 mg at 10/01/15 2110  . mupirocin ointment (BACTROBAN) 2 % 1 application  1 application Nasal BID  Ezequiel  Coralyn Pear, MD   1 application at 82/64/15 1134  . ondansetron (ZOFRAN-ODT) disintegrating tablet 8 mg  8 mg Oral Q8H PRN Barton Dubois, MD   8 mg at 10/02/15 0351  . pantoprazole (PROTONIX) EC tablet 40 mg  40 mg Oral BID WC Barton Dubois, MD   40 mg at 10/02/15 0754  . phenol (CHLORASEPTIC) mouth spray 1 spray  1 spray Mouth/Throat PRN Kelvin Cellar, MD      . prochlorperazine (COMPAZINE) injection 10 mg  10 mg Intravenous Q6H PRN Kelvin Cellar, MD   10 mg at 09/30/15 2023  . promethazine (PHENERGAN) tablet 12.5 mg  12.5 mg Oral Q6H PRN Kelvin Cellar, MD   12.5 mg at 10/01/15 1447  . simvastatin (ZOCOR) tablet 20 mg  20 mg Oral q1800 Kelvin Cellar, MD   20 mg at 09/30/15 1719  . sodium chloride flush (NS) 0.9 % injection 3 mL  3 mL Intravenous Q12H Kelvin Cellar, MD   3 mL at 10/01/15 2200    Musculoskeletal: Strength & Muscle Tone: decreased Gait & Station: unable to stand Patient leans: N/A  Psychiatric Specialty Exam: ROS   Blood pressure 109/69, pulse 103, temperature 98.6 F (37 C), temperature source Oral, resp. rate 20, height 5' 2"  (1.575 m), weight 87.4 kg (192 lb 10.9 oz), last menstrual period 09/24/2015, SpO2 98 %.Body mass index is 35.23 kg/(m^2).  General Appearance: Guarded  Eye Contact::  Good  Speech:  Clear and Coherent  Volume:  Normal  Mood:  Depressed  Affect:  Appropriate and Congruent  Thought Process:  Coherent and Goal Directed  Orientation:  Full (Time, Place, and Person)  Thought Content:  WDL  Suicidal Thoughts:  No  Homicidal Thoughts:  No  Memory:  Immediate;   Fair Recent;   Fair  Judgement:  Impaired  Insight:  Shallow  Psychomotor Activity:  Decreased  Concentration:  Fair  Recall:  [place]: Good  Akathisia:  Negative  Handed:  Right  AIMS (if indicated):     Assets:  Communication Skills Desire for Improvement Housing Leisure Time Resilience Transportation  ADL's:  Impaired  Cognition:  WNL  Sleep:      Treatment Plan Summary: Patient has no safety concerns as she denies current suicidal/homicidal ideation and psychosis. Patient has capacity to make her own medical decisions and living arrangements based on my evaluation  Continue Abilify 20 mg daily for depression and psychosis,  Continue hydroxyzine 50 mg for anxiety as needed,  Continue Lithobid 300 mg twice daily for mood swings Continue Remeron 15 mg at bedtime for insomnia Appreciate psychiatric consultation and we sign off as of today Please contact [phone] or [phone] if needs further assistance  Disposition: Patient will be referred to the outpatient medication management when medical stable and cleared Patient does not meet criteria for psychiatric inpatient admission. Supportive therapy provided about ongoing stressors.  Durward Parcel., MD 10/02/2015 12:13 PM

## 2015-10-02 NOTE — Progress Notes (Signed)
Pt discharged home. Discharge instructions with prescriptions given and bus ticket given. Pt [facility] per CM. Pt verbalized understanding.

## 2015-10-08 ENCOUNTER — Emergency Department (HOSPITAL_COMMUNITY)
Admission: EM | Admit: 2015-10-08 | Discharge: 2015-10-13 | Disposition: A | Discharge status: Other Acute Inpatient Hospital | Payer: Federal, State, Local not specified - Other | Attending: Emergency Medicine | Admitting: Emergency Medicine

## 2015-10-08 ENCOUNTER — Encounter (HOSPITAL_COMMUNITY): Payer: Self-pay | Admitting: Emergency Medicine

## 2015-10-08 DIAGNOSIS — R739 Hyperglycemia, unspecified: Secondary | ICD-10-CM

## 2015-10-08 DIAGNOSIS — Z79899 Other long term (current) drug therapy: Secondary | ICD-10-CM | POA: Insufficient documentation

## 2015-10-08 DIAGNOSIS — R45851 Suicidal ideations: Secondary | ICD-10-CM

## 2015-10-08 DIAGNOSIS — Z794 Long term (current) use of insulin: Secondary | ICD-10-CM | POA: Insufficient documentation

## 2015-10-08 DIAGNOSIS — E1165 Type 2 diabetes mellitus with hyperglycemia: Secondary | ICD-10-CM | POA: Insufficient documentation

## 2015-10-08 DIAGNOSIS — Z720 Tobacco use: Secondary | ICD-10-CM | POA: Insufficient documentation

## 2015-10-08 DIAGNOSIS — I10 Essential (primary) hypertension: Secondary | ICD-10-CM | POA: Insufficient documentation

## 2015-10-08 DIAGNOSIS — F329 Major depressive disorder, single episode, unspecified: Secondary | ICD-10-CM | POA: Insufficient documentation

## 2015-10-08 DIAGNOSIS — Z8659 Personal history of other mental and behavioral disorders: Secondary | ICD-10-CM

## 2015-10-08 DIAGNOSIS — R440 Auditory hallucinations: Secondary | ICD-10-CM | POA: Insufficient documentation

## 2015-10-08 DIAGNOSIS — F333 Major depressive disorder, recurrent, severe with psychotic symptoms: Secondary | ICD-10-CM | POA: Diagnosis present

## 2015-10-08 LAB — PREGNANCY, URINE: Preg Test, Ur: NEGATIVE

## 2015-10-08 LAB — URINALYSIS, ROUTINE W REFLEX MICROSCOPIC
Bilirubin Urine: NEGATIVE
Glucose, UA: >1000 mg/dL — AB
Ketones, ur: NEGATIVE mg/dL
Leukocytes, UA: NEGATIVE
Nitrite: NEGATIVE
Protein, ur: NEGATIVE mg/dL
Specific Gravity, Urine: 1.027 (ref 1.005–1.030)
pH: 7 (ref 5.0–8.0)

## 2015-10-08 LAB — CBC
HCT: 35.1 % — ABNORMAL LOW (ref 36.0–46.0)
Hemoglobin: 11.2 g/dL — ABNORMAL LOW (ref 12.0–15.0)
MCH: 25.1 pg — ABNORMAL LOW (ref 26.0–34.0)
MCHC: 31.9 g/dL (ref 30.0–36.0)
MCV: 78.7 fL (ref 78.0–100.0)
Platelets: 474 10*3/uL — ABNORMAL HIGH (ref 150–400)
RBC: 4.46 MIL/uL (ref 3.87–5.11)
RDW: 16.6 % — ABNORMAL HIGH (ref 11.5–15.5)
WBC: 7.3 10*3/uL (ref 4.0–10.5)

## 2015-10-08 LAB — RAPID URINE DRUG SCREEN, HOSP PERFORMED
Amphetamines: NOT DETECTED
Barbiturates: NOT DETECTED
Benzodiazepines: NOT DETECTED
Cocaine: NOT DETECTED
Opiates: NOT DETECTED
Tetrahydrocannabinol: NOT DETECTED

## 2015-10-08 LAB — COMPREHENSIVE METABOLIC PANEL
ALT: 30 U/L (ref 14–54)
AST: 23 U/L (ref 15–41)
Albumin: 3.3 g/dL — ABNORMAL LOW (ref 3.5–5.0)
Alkaline Phosphatase: 89 U/L (ref 38–126)
Anion gap: 12 (ref 5–15)
BUN: <5 mg/dL — ABNORMAL LOW (ref 6–20)
CO2: 23 mmol/L (ref 22–32)
Calcium: 9.2 mg/dL (ref 8.9–10.3)
Chloride: 100 mmol/L — ABNORMAL LOW (ref 101–111)
Creatinine, Ser: 0.67 mg/dL (ref 0.44–1.00)
GFR calc Af Amer: >60 mL/min (ref >60)
GFR calc non Af Amer: >60 mL/min (ref >60)
Glucose, Bld: 460 mg/dL — ABNORMAL HIGH (ref 65–99)
Potassium: 4.2 mmol/L (ref 3.5–5.1)
Sodium: 135 mmol/L (ref 135–145)
Total Bilirubin: 0.4 mg/dL (ref 0.3–1.2)
Total Protein: 6.5 g/dL (ref 6.5–8.1)

## 2015-10-08 LAB — CBG MONITORING, ED
Glucose-Capillary: 514 mg/dL — ABNORMAL HIGH (ref 65–99)
Glucose-Capillary: 435 mg/dL — ABNORMAL HIGH (ref 65–99)
Glucose-Capillary: 339 mg/dL — ABNORMAL HIGH (ref 65–99)
Glucose-Capillary: 266 mg/dL — ABNORMAL HIGH (ref 65–99)
Glucose-Capillary: 411 mg/dL — ABNORMAL HIGH (ref 65–99)
Glucose-Capillary: 282 mg/dL — ABNORMAL HIGH (ref 65–99)
Glucose-Capillary: 383 mg/dL — ABNORMAL HIGH (ref 65–99)

## 2015-10-08 LAB — URINE MICROSCOPIC-ADD ON

## 2015-10-08 LAB — ACETAMINOPHEN LEVEL: Acetaminophen (Tylenol), Serum: <10 ug/mL — ABNORMAL LOW (ref 10–30)

## 2015-10-08 LAB — LITHIUM LEVEL: Lithium Lvl: <0.06 mmol/L — ABNORMAL LOW (ref 0.60–1.20)

## 2015-10-08 LAB — SALICYLATE LEVEL: Salicylate Lvl: <4 mg/dL (ref 2.8–30.0)

## 2015-10-08 LAB — ETHANOL: Alcohol, Ethyl (B): 86 mg/dL — ABNORMAL HIGH (ref <5)

## 2015-10-08 MED ORDER — INSULIN GLARGINE 100 UNIT/ML ~~LOC~~ SOLN
60.0000 [IU] | Freq: Two times a day (BID) | SUBCUTANEOUS | Status: DC
  Administered 2015-10-08 – 2015-10-13 (×11): 60 [IU] via SUBCUTANEOUS
  Filled 2015-10-08 (×18): qty 0.6

## 2015-10-08 MED ORDER — GABAPENTIN 300 MG PO CAPS
300.0000 mg | ORAL_CAPSULE | Freq: Three times a day (TID) | ORAL | Status: DC
  Administered 2015-10-08 – 2015-10-13 (×16): 300 mg via ORAL
  Filled 2015-10-08 (×16): qty 1

## 2015-10-08 MED ORDER — PROMETHAZINE HCL 25 MG PO TABS
12.5000 mg | ORAL_TABLET | Freq: Four times a day (QID) | ORAL | Status: DC | PRN
  Administered 2015-10-08: 12.5 mg via ORAL
  Filled 2015-10-08: qty 1

## 2015-10-08 MED ORDER — SIMVASTATIN 20 MG PO TABS
20.0000 mg | ORAL_TABLET | Freq: Every day | ORAL | Status: DC
  Administered 2015-10-08 – 2015-10-12 (×5): 20 mg via ORAL
  Filled 2015-10-08 (×7): qty 1

## 2015-10-08 MED ORDER — METOCLOPRAMIDE HCL 10 MG PO TABS
10.0000 mg | ORAL_TABLET | Freq: Three times a day (TID) | ORAL | Status: DC
  Administered 2015-10-08 – 2015-10-13 (×21): 10 mg via ORAL
  Filled 2015-10-08 (×21): qty 1

## 2015-10-08 MED ORDER — VITAMIN B1 100 MG PO TABS
1.0000 | ORAL_TABLET | Freq: Every day | ORAL | Status: DC
  Filled 2015-10-08 (×3): qty 1

## 2015-10-08 MED ORDER — LISINOPRIL 10 MG PO TABS
10.0000 mg | ORAL_TABLET | Freq: Every day | ORAL | Status: DC
  Administered 2015-10-08 – 2015-10-13 (×6): 10 mg via ORAL
  Filled 2015-10-08 (×6): qty 1

## 2015-10-08 MED ORDER — ADULT MULTIVITAMIN W/MINERALS CH
1.0000 | ORAL_TABLET | Freq: Every day | ORAL | Status: DC
  Administered 2015-10-08 – 2015-10-13 (×6): 1 via ORAL
  Filled 2015-10-08 (×6): qty 1

## 2015-10-08 MED ORDER — SODIUM CHLORIDE 0.9 % IV BOLUS (SEPSIS)
1000.0000 mL | Freq: Once | INTRAVENOUS | Status: AC
  Administered 2015-10-08: 1000 mL via INTRAVENOUS

## 2015-10-08 MED ORDER — MIRTAZAPINE 30 MG PO TABS
45.0000 mg | ORAL_TABLET | Freq: Every day | ORAL | Status: DC
  Administered 2015-10-08 – 2015-10-12 (×5): 45 mg via ORAL
  Filled 2015-10-08 (×5): qty 3

## 2015-10-08 MED ORDER — SODIUM CHLORIDE 0.9 % IV BOLUS (SEPSIS): 1000.0000 mL | Freq: Once | INTRAVENOUS | Status: DC

## 2015-10-08 MED ORDER — PANTOPRAZOLE SODIUM 40 MG PO TBEC
40.0000 mg | DELAYED_RELEASE_TABLET | Freq: Two times a day (BID) | ORAL | Status: DC
  Administered 2015-10-08 – 2015-10-13 (×11): 40 mg via ORAL
  Filled 2015-10-08 (×11): qty 1

## 2015-10-08 MED ORDER — HYDROXYZINE HCL 25 MG PO TABS
50.0000 mg | ORAL_TABLET | Freq: Four times a day (QID) | ORAL | Status: DC | PRN
  Administered 2015-10-08 – 2015-10-13 (×13): 50 mg via ORAL
  Filled 2015-10-08 (×13): qty 2

## 2015-10-08 MED ORDER — ARIPIPRAZOLE 10 MG PO TABS
20.0000 mg | ORAL_TABLET | Freq: Every day | ORAL | Status: DC
  Administered 2015-10-08 – 2015-10-13 (×6): 20 mg via ORAL
  Filled 2015-10-08 (×7): qty 2

## 2015-10-08 MED ORDER — INSULIN ASPART 100 UNIT/ML ~~LOC~~ SOLN
8.0000 [IU] | Freq: Three times a day (TID) | SUBCUTANEOUS | Status: DC
  Administered 2015-10-08 – 2015-10-09 (×5): 8 [IU] via SUBCUTANEOUS
  Filled 2015-10-08 (×5): qty 1

## 2015-10-08 MED ORDER — VITAMIN B-1 100 MG PO TABS
100.0000 mg | ORAL_TABLET | Freq: Every day | ORAL | Status: DC
  Administered 2015-10-08 – 2015-10-13 (×6): 100 mg via ORAL
  Filled 2015-10-08 (×5): qty 1

## 2015-10-08 MED ORDER — INSULIN ASPART 100 UNIT/ML ~~LOC~~ SOLN
10.0000 [IU] | Freq: Once | SUBCUTANEOUS | Status: AC
  Administered 2015-10-08: 10 [IU] via SUBCUTANEOUS
  Filled 2015-10-08: qty 1

## 2015-10-08 NOTE — ED Notes (Signed)
Charge nurse and staffing office notified for pt.'s sitter , purple scrubs given to pt. , security notified to wand pt.  

## 2015-10-08 NOTE — ED Notes (Signed)
Pt. reports suicidal ideation with auditory hallucinations  plans to overdose on medication .

## 2015-10-08 NOTE — ED Provider Notes (Signed)
By signing my name below, I, Freida BusmanDiana Omoyeni, attest that this documentation has been prepared under the direction and in the presence of Chameka Mcmullen N Lidwina Kaner, DO . Electronically Signed: Freida Busmaniana Omoyeni, Scribe. 10/08/2015. 2:48 AM.  TIME SEEN: 2:16 AM  CHIEF COMPLAINT:   Chief Complaint  Patient presents with  . Suicidal     HPI:  HPI Comments:  Carolyn Ayala is a 40 y.o. female with a history of IDDM, who presents to the Emergency Department complaining of SI. Pt notes her mom died ~ 6 months ago and she has not been handling her death well; states she stopped taking anti-psychotics. Pt states her mom's birthday is tomorrow and she has been having auditory hallucinations telling her to harm herself. Pt states she wanted to get drunk and run in front of a car today. She has tried to harm herself in the past. She admits to having 1 four loco PTA. She denies illicit drug use. She notes she has had little to eat and has taken her insulin. Pt was admitted ~ 2 weeks ago due to DKA. She denies cough, fever, diarrhea, nausea, and vomiting. Pt notes increased thirst at this time but state she physically feels fine otherwise. No alleviating factors noted.   ROS: See HPI Constitutional: no fever  Eyes: no drainage  ENT: no runny nose   Cardiovascular:  no chest pain  Resp: no SOB  GI: no vomiting GU: no dysuria Integumentary: no rash  Allergy: no hives  Musculoskeletal: no leg swelling  Neurological: no slurred speech ROS otherwise negative  PAST MEDICAL HISTORY/PAST SURGICAL HISTORY:  Past Medical History  Diagnosis Date  . Mental disorder   . Depression   . Diabetes mellitus without complication (HCC)   . Hypertension   . Noncompliance     MEDICATIONS:  Prior to Admission medications   Medication Sig Start Date End Date Taking? Authorizing Provider  ARIPiprazole (ABILIFY) 20 MG tablet Take 1 tablet (20 mg total) by mouth daily. 10/02/15   Vassie Lollarlos Madera, MD  fluticasone  [facility](FLONASE) 50 MCG/ACT nasal spray Place 1 spray into both nostrils daily as needed for allergies or rhinitis. Patient not taking: Reported on 09/21/2015 08/17/15   Adonis BrookSheila Agustin, NP  gabapentin (NEURONTIN) 300 MG capsule Take 1 capsule (300 mg total) by mouth 3 (three) times daily. 10/02/15   Vassie Lollarlos Madera, MD  hydrOXYzine (ATARAX/VISTARIL) 50 MG tablet Take 1 tablet (50 mg total) by mouth every 6 (six) hours as needed for anxiety. 08/22/15   Shuvon B Rankin, NP  insulin aspart (NOVOLOG) 100 UNIT/ML injection Inject 8 Units into the skin 3 (three) times daily with meals. 10/02/15   Vassie Lollarlos Madera, MD  insulin glargine (LANTUS) 100 UNIT/ML injection Inject 0.6 mLs (60 Units total) into the skin 2 (two) times daily. 10/02/15   Vassie Lollarlos Madera, MD  lisinopril (PRINIVIL,ZESTRIL) 10 MG tablet Take 10 mg by mouth daily.    Historical Provider, MD  lithium carbonate (LITHOBID) 300 MG CR tablet Take 1 tablet (300 mg total) by mouth every 12 (twelve) hours. Patient not taking: Reported on 09/24/2015 08/22/15   Shuvon B Rankin, NP  metoCLOPramide (REGLAN) 10 MG tablet Take 1 tablet (10 mg total) by mouth 4 (four) times daily -  before meals and at bedtime. 10/02/15   Vassie Lollarlos Madera, MD  mirtazapine (REMERON) 45 MG tablet Take 1 tablet (45 mg total) by mouth at bedtime. 08/17/15   Adonis BrookSheila Agustin, NP  Multiple Vitamin (MULTIVITAMIN WITH MINERALS) TABS tablet Take 1 tablet  by mouth daily.    Historical Provider, MD  pantoprazole (PROTONIX) 40 MG tablet Take 1 tablet (40 mg total) by mouth 2 (two) times daily with a meal. 10/02/15   Vassie Loll, MD  promethazine (PHENERGAN) 12.5 MG tablet Take 1 tablet (12.5 mg total) by mouth every 6 (six) hours as needed for nausea or vomiting. 10/02/15   Vassie Loll, MD  simvastatin (ZOCOR) 20 MG tablet Take 1 tablet (20 mg total) by mouth daily at 6 PM. 08/17/15   Adonis Brook, NP  Thiamine Mononitrate (VITAMIN B1 PO) Take 1 tablet by mouth daily.    Historical Provider, MD  traMADol  (ULTRAM) 50 MG tablet Take 1 tablet (50 mg total) by mouth every 6 (six) hours as needed for severe pain. 10/02/15   Vassie Loll, MD    ALLERGIES:  No Known Allergies  SOCIAL HISTORY:  Social History  Substance Use Topics  . Smoking status: Never Smoker   . Smokeless tobacco: Current User  . Alcohol Use: Yes     Comment: daily    FAMILY HISTORY: Family History  Problem Relation Age of Onset  . Depression Mother     EXAM: BP 117/81 mmHg  Pulse 110  Temp(Src) 98.2 F (36.8 C) (Oral)  Resp 20  Ht  (1.676 m)  Wt 197 lb 7 oz (89.557 kg)  BMI 31.88 kg/m2  SpO2 98%  LMP 09/24/2015 CONSTITUTIONAL: Alert and oriented and responds appropriately to questions. Well-appearing; well-nourished HEAD: Normocephalic EYES: Conjunctivae clear, PERRL ENT: normal nose; no rhinorrhea; slightly dry mucous membranes NECK: Supple, no meningismus, no LAD  CARD: Regular and tachycardic; S1 and S2 appreciated; no murmurs, no clicks, no rubs, no gallops RESP: Normal chest excursion without splinting or tachypnea; breath sounds clear and equal bilaterally; no wheezes, no rhonchi, no rales, no hypoxia or respiratory distress, speaking full sentences ABD/GI: Normal bowel sounds; non-distended; soft, non-tender, no rebound, no guarding, no peritoneal signs BACK:  The back appears normal and is non-tender to palpation, there is no CVA tenderness EXT: Normal ROM in all joints; non-tender to palpation; no edema; normal capillary refill; no cyanosis, no calf tenderness or swelling    SKIN: Normal color for age and race; warm; no rash NEURO: Moves all extremities equally, sensation to light touch intact diffusely, cranial nerves II through XII intact PSYCH:SI with plan. No HI. Pt having auditory command hallucinations telling her to hurt herself. Grooming and personal hygiene are appropriate.  MEDICAL DECISION MAKING: Patient here with SI with plan. Also having command hallucinations. She is  hyperglycemic. Will check for signs of DKA on blood work, urine. Will give IV fluids, subcutaneous insulin. We'll consult TTS when she is medically stable.  ED PROGRESS: Blood glucose is improving with IV hydration, insulin. She is not in DKA. Urine shows no ketones, bicarbonate and anion gap are normal. She is currently medically cleared. TTS has been consult. Their disposition is pending.   I personally performed the services described in this documentation, which was scribed in my presence. The recorded information has been reviewed and is accurate.    Layla Maw Carden Teel, DO 10/08/15 [phone]
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
## 2015-10-08 NOTE — ED Notes (Signed)
Pt states that she drank a 4 loco around 8 pm last night.

## 2015-10-08 NOTE — ED Notes (Signed)
TTS in room, pt up in chair eating breakfast

## 2015-10-08 NOTE — ED Notes (Signed)
CBG 435. 

## 2015-10-08 NOTE — ED Notes (Signed)
CBG was 514, RN notified

## 2015-10-08 NOTE — Progress Notes (Addendum)
Inpatient Diabetes Program Recommendations  AACE/ADA: New Consensus Statement on Inpatient Glycemic Control (2015)  Target Ranges:  Prepandial:   less than 140 mg/dL      Peak postprandial:   less than 180 mg/dL (1-2 hours)      Critically ill patients:  140 - 180 mg/dL   Review of Glycemic Control  Diabetes history: DM Outpatient Diabetes medications: Lantus 55 units QHS, Novolog 15 units TID meal coverage Current orders for Inpatient glycemic control: Lantus 60 units BID, Novolog 8 units TID  Inpatient Diabetes Program Recommendations:   Previous admissions patient was on the following regimen: Lantus 60 units BID, Novolog Moderate + HS scale + Novolog 15 units meal coverage.  Consider ordering last admissions regimen while inpatient.  Inpatient Diabetes Team has spoken to pt numerous times regarding her diabetes and poor glycemic control. She is very familiar to our team.  Thanks,  Christena DeemShannon Hawa Henly RN, MSN, [facility] Inpatient Diabetes Coordinator Team Pager [phone] (8a-5p)

## 2015-10-08 NOTE — ED Notes (Signed)
CBG 339 

## 2015-10-08 NOTE — BH Assessment (Signed)
[facility] Assessment Progress Note   Nurse Delorise Jacksonori took the teleassessment monitor to patient room but was unable to get patient to awaken to participate in assessment.  She will let patient's nurse know that assessment was attempted.

## 2015-10-08 NOTE — ED Notes (Signed)
Pt was falling asleep while this RN was hooking the pt up to the monitor. Pt was asleep by the time this RN had finished hooking the pt up.

## 2015-10-09 LAB — CBG MONITORING, ED
Glucose-Capillary: 361 mg/dL — ABNORMAL HIGH (ref 65–99)
Glucose-Capillary: 361 mg/dL — ABNORMAL HIGH (ref 65–99)
Glucose-Capillary: 474 mg/dL — ABNORMAL HIGH (ref 65–99)
Glucose-Capillary: 511 mg/dL — ABNORMAL HIGH (ref 65–99)
Glucose-Capillary: 461 mg/dL — ABNORMAL HIGH (ref 65–99)

## 2015-10-09 MED ORDER — LORAZEPAM 1 MG PO TABS
0.0000 mg | ORAL_TABLET | Freq: Two times a day (BID) | ORAL | Status: DC
  Filled 2015-10-09: qty 1

## 2015-10-09 MED ORDER — LORAZEPAM 1 MG PO TABS
0.0000 mg | ORAL_TABLET | Freq: Four times a day (QID) | ORAL | Status: DC
  Administered 2015-10-09 (×2): 2 mg via ORAL
  Administered 2015-10-10 – 2015-10-11 (×4): 1 mg via ORAL
  Filled 2015-10-09: qty 1
  Filled 2015-10-09 (×2): qty 2
  Filled 2015-10-09 (×2): qty 1

## 2015-10-09 MED ORDER — INSULIN ASPART 100 UNIT/ML ~~LOC~~ SOLN
14.0000 [IU] | Freq: Three times a day (TID) | SUBCUTANEOUS | Status: DC
  Administered 2015-10-09 – 2015-10-10 (×4): 14 [IU] via SUBCUTANEOUS
  Filled 2015-10-09 (×5): qty 1

## 2015-10-09 NOTE — ED Notes (Signed)
Chaplain at bedside patient requested a bible. Chaplain only has small font text.  Patient stated print is too small.

## 2015-10-09 NOTE — ED Notes (Signed)
Food tray has not arrived. When food arrives CBG and insulin will be given.

## 2015-10-09 NOTE — ED Notes (Signed)
Pt to [phone] for shower with sitter and security. [name] linen changed and room cleaned.

## 2015-10-09 NOTE — ED Notes (Addendum)
Sitter was attempting to take repeat cbg and VS per RN request.  Pt became hostile and began yelling at sitter stating no one was going to stick her again, that she "just got stuck".  RN explained that previous staff were not able to give insulin in time.  However, pt remained hostile with sitter, repetitively insulting her, etc. Pt did agree to have RN repeat CBG stating she only has RN's take her cbg's. Staffing called and sitters switched.  Pt requested vistaril for her increased anxiety.

## 2015-10-09 NOTE — ED Notes (Signed)
Spoke with Doctor regarding insulin dose for novolog.

## 2015-10-09 NOTE — ED Notes (Signed)
Food tray given to patient 

## 2015-10-09 NOTE — ED Notes (Signed)
Pt comes across very manipulative to RN; pt demands to take meds when she feel like it; Pt request to take Remeron later because it is a sleeping medication; RN wen to give insulin and pt states she will hold off just because she wants to.

## 2015-10-09 NOTE — ED Notes (Signed)
Pt given diet ginger ale to help with nausea per pt request

## 2015-10-09 NOTE — ED Notes (Signed)
Sitter came to desk and asked for a bedside table without a mirror on top for pt. Sitter explained that pt attempted to break mirror. Pt attempt unsuccessful. Bedside tables switched.

## 2015-10-09 NOTE — Progress Notes (Addendum)
Discussed pt's case with psych team. Inpatient psych treatment has been recommended, however psych facilities requesting pt's CBG be below 300 for 24 hours. Will continue to monitor and make referrals when appropriate. Pt can also be re-evaluated by psychiatry/TTS as needed.  Ilean SkillMeghan Taelyn Nemes, MSW, LCSW Clinical Social Work, Disposition  10/09/2015 [phone]  [facility] could go ahead and take referral and, if pt looks appropriate for admission other than CBG level, can continue to monitor and consider transfer when CBG at desired level.  Faxed referral for consideration.

## 2015-10-09 NOTE — Progress Notes (Addendum)
Carolyn KaufmannLaura Davis NP recommended inpatient psychiatric treatment for patient on 10/09/15.  Carolyn Ayala - reviewing referral.  [facility] Carolyn Ayala - adult beds.  Decl[place] at: [facility] [place] due to patient not taking her insult and her CBH elevated.  At capacity: Carolyn Ayala  CSW will continue to seek placement.  Carolyn Ayala, LCSWA Disposition staff 10/09/2015 5:00 PM

## 2015-10-10 LAB — CBG MONITORING, ED
Glucose-Capillary: 408 mg/dL — ABNORMAL HIGH (ref 65–99)
Glucose-Capillary: 398 mg/dL — ABNORMAL HIGH (ref 65–99)
Glucose-Capillary: 337 mg/dL — ABNORMAL HIGH (ref 65–99)
Glucose-Capillary: 472 mg/dL — ABNORMAL HIGH (ref 65–99)

## 2015-10-10 MED ORDER — INSULIN ASPART 100 UNIT/ML ~~LOC~~ SOLN
10.0000 [IU] | Freq: Once | SUBCUTANEOUS | Status: AC
  Administered 2015-10-10: 10 [IU] via INTRAVENOUS

## 2015-10-10 MED ORDER — DEXTROSE-NACL 5-0.45 % IV SOLN: INTRAVENOUS | Status: DC

## 2015-10-10 MED ORDER — INSULIN ASPART 100 UNIT/ML ~~LOC~~ SOLN
0.0000 [IU] | Freq: Three times a day (TID) | SUBCUTANEOUS | Status: DC
  Administered 2015-10-11: 7 [IU] via SUBCUTANEOUS
  Administered 2015-10-11: 20 [IU] via SUBCUTANEOUS
  Administered 2015-10-11: 11 [IU] via SUBCUTANEOUS
  Administered 2015-10-12: 7 [IU] via SUBCUTANEOUS
  Administered 2015-10-12 (×2): 15 [IU] via SUBCUTANEOUS
  Administered 2015-10-13: 10 [IU] via SUBCUTANEOUS
  Administered 2015-10-13: 7 [IU] via SUBCUTANEOUS
  Filled 2015-10-10 (×5): qty 1

## 2015-10-10 MED ORDER — GUAIFENESIN 100 MG/5ML PO SOLN
5.0000 mL | Freq: Four times a day (QID) | ORAL | Status: DC | PRN
  Administered 2015-10-11 (×3): 100 mg via ORAL
  Filled 2015-10-10 (×3): qty 5

## 2015-10-10 MED ORDER — SODIUM CHLORIDE 0.9 % IV BOLUS (SEPSIS)
1000.0000 mL | Freq: Once | INTRAVENOUS | Status: AC
  Administered 2015-10-10: 1000 mL via INTRAVENOUS

## 2015-10-10 MED ORDER — INSULIN ASPART 100 UNIT/ML ~~LOC~~ SOLN
0.0000 [IU] | Freq: Every day | SUBCUTANEOUS | Status: DC
  Administered 2015-10-10: 5 [IU] via SUBCUTANEOUS
  Administered 2015-10-11: 3 [IU] via SUBCUTANEOUS
  Filled 2015-10-10: qty 1

## 2015-10-10 MED ORDER — SODIUM CHLORIDE 0.9 % IV SOLN
INTRAVENOUS | Status: DC
  Filled 2015-10-10: qty 2.5

## 2015-10-10 NOTE — ED Notes (Signed)
Pt given pink marker as requested.

## 2015-10-10 NOTE — ED Notes (Signed)
Pt requesting sitter not be in room with her. It is explained sitter must have pt within touching distance. Pt is also requesting to walk around nurses desk a couple of time due to her legs cramping up.

## 2015-10-10 NOTE — ED Provider Notes (Signed)
Glucose control has been ineffective thus far. Accepting facility requesting CBG <300 for 24 consecutive hours. Will get more aggressive with sliding scale and QHS correction dosing, provided IVF bolus and 10u IV insulin to help with immediate correction.  If resistant doses of daily and QHS correction regular insulin with BID lantus administration is not sufficient, would consider seeking medical admission to correct hyperglycemia prior to transfer to psychiatric facility as this is resulting in prolonged ED stay and we are unable to control her hyperglycemia effectively with resources available to this department.  Lyndal Pulleyaniel Kamaree Berkel, MD 10/10/15 [phone]

## 2015-10-10 NOTE — ED Notes (Signed)
Pt given graham crackers, peanut butter and Sprite Zero for snack. Pt had asked for Rice Krispies treat and cookies in addition - advised pt unable to do so d/t CBG readings. Voiced understanding.

## 2015-10-10 NOTE — ED Notes (Signed)
Pt requesting cough med d/t coughing. No coughing noted by this RN - encouraged pt to drink water. Water pitcher on bedside table.

## 2015-10-10 NOTE — ED Provider Notes (Signed)
Remains hyperglycemic without DKA.   Novolog dose increased last evening.  Continue to monitor.  Linwood DibblesJon Maleeyah Mccaughey, MD 10/10/15 [phone]

## 2015-10-10 NOTE — ED Notes (Signed)
CIWA 2 - Ativan given as requested. Pt requested 2mg  - advised pt may only receive 1mg . Voiced understanding.

## 2015-10-10 NOTE — ED Notes (Signed)
Pt given Ativan as requested - CIWA 2.

## 2015-10-11 LAB — BASIC METABOLIC PANEL
Anion gap: 10 (ref 5–15)
BUN: 8 mg/dL (ref 6–20)
CO2: 20 mmol/L — ABNORMAL LOW (ref 22–32)
Calcium: 9.1 mg/dL (ref 8.9–10.3)
Chloride: 103 mmol/L (ref 101–111)
Creatinine, Ser: 0.68 mg/dL (ref 0.44–1.00)
GFR calc Af Amer: >60 mL/min (ref >60)
GFR calc non Af Amer: >60 mL/min (ref >60)
Glucose, Bld: 340 mg/dL — ABNORMAL HIGH (ref 65–99)
Potassium: 4.4 mmol/L (ref 3.5–5.1)
Sodium: 133 mmol/L — ABNORMAL LOW (ref 135–145)

## 2015-10-11 LAB — CBG MONITORING, ED
Glucose-Capillary: 300 mg/dL — ABNORMAL HIGH (ref 65–99)
Glucose-Capillary: 293 mg/dL — ABNORMAL HIGH (ref 65–99)
Glucose-Capillary: 248 mg/dL — ABNORMAL HIGH (ref 65–99)
Glucose-Capillary: 369 mg/dL — ABNORMAL HIGH (ref 65–99)
Glucose-Capillary: 265 mg/dL — ABNORMAL HIGH (ref 65–99)

## 2015-10-11 MED ORDER — LORAZEPAM 1 MG PO TABS
1.0000 mg | ORAL_TABLET | Freq: Three times a day (TID) | ORAL | Status: DC | PRN
  Administered 2015-10-11 – 2015-10-12 (×3): 1 mg via ORAL
  Filled 2015-10-11 (×3): qty 1

## 2015-10-11 MED ORDER — SODIUM CHLORIDE 0.9 % IV BOLUS (SEPSIS)
1000.0000 mL | Freq: Once | INTRAVENOUS | Status: AC
  Administered 2015-10-11: 1000 mL via INTRAVENOUS

## 2015-10-11 NOTE — ED Notes (Signed)
Pt concerned re: CBG 369. Advised pt will receive additional insulin. States she will not eat breakfast in am - recommended to pt that she eats and not skip meals. Voiced understanding.

## 2015-10-11 NOTE — ED Notes (Signed)
Pt ambulating w/sitter and security to shower - [phone].

## 2015-10-11 NOTE — ED Notes (Signed)
Pt refusing IV until after she showers even after much encouragement.

## 2015-10-11 NOTE — ED Notes (Signed)
Sitting in chair in room working word search puzzles. Asking if EDP has increased her Neurontin to 900mg  TID. Advised pt no - it remains at 300mg  TID as per last hospital discharge note from 2 weeks ago. Voiced understanding.

## 2015-10-11 NOTE — ED Notes (Signed)
Pt given decaf coffee w/Splena - no cream - voiced understanding of no more until dinner.

## 2015-10-11 NOTE — ED Notes (Signed)
Pt asking for additional cream and sugar. Advised pt she was given the amount of packs she requested. Voiced understanding.

## 2015-10-11 NOTE — ED Notes (Signed)
Mal[place]urkey sandwich, apple sauce, decaf coffee w/Splenda x 6 and cream x 4 as requested. Water and ice also given.

## 2015-10-11 NOTE — ED Notes (Signed)
Pt is sleeping, has been restless all night. Breakfast, lunch and dinner trays ordered. Will retry VS when awake.

## 2015-10-11 NOTE — ED Notes (Signed)
Pt at nurses' desk. Pt returned to room as asked. Pt noted to be wearing hospital gowns x 2. IV to right hand.

## 2015-10-11 NOTE — ED Notes (Signed)
Snack given.

## 2015-10-11 NOTE — ED Notes (Signed)
Pt given magazines as requested. Pt had also requested to ambulate in hallway - voiced understanding of not being able to do so.

## 2015-10-11 NOTE — ED Notes (Signed)
Pt and sitter in nourishment room.  This RN notified that snack was already given prior.  Pt stated the sandwich smelled funny 45 minutes ago and she wasn't going to eat it.  Pt stated she didn't eat the crackers in the bag also, requested another package of crackers and a Sprite.  This nurse offered the crackers and told patient she had requested coffee with Splenda at snack time so she could have some water with them.  This nurse went into the patients room to collect the sandwich which patient provided after being stored in a drawer.  Reminded patient that for her to be placed our goal was was to get and keep her blood sugar below 300.  Pt stated "I'm not going to take my Lantus tonight, you can go ahead and mark it refused" Dr. Effie ShyWentz notified.

## 2015-10-11 NOTE — ED Notes (Signed)
Pt requesting for IV to be removed. Encouraged pt to leave it d/t might need additional IV fluids. Pt refused.

## 2015-10-11 NOTE — ED Notes (Signed)
Pt given word searches as requested.

## 2015-10-11 NOTE — ED Notes (Signed)
Pt refusing to advise sitter of snack request - advised her she wanted to tell RN only. Requested for pt to please advise sitter d/t will be a while before RN may obtain. Pt declined.

## 2015-10-11 NOTE — ED Notes (Signed)
Ativan given as requested. 

## 2015-10-11 NOTE — ED Provider Notes (Signed)
Patient is in no distress. She has mildly tachycardic. No chest pain or shortness of breath. Oxygenation 100%. She remains hyperglycemic. Anion gap is normal.  Carolyn OctaveStephen Eithen Castiglia, MD 10/11/15 1006

## 2015-10-11 NOTE — ED Notes (Signed)
Pt requesting cough syrup and "any other PRN's available".  This nurse told pt that she hasn't been coughing in the 3 hours I have been here.

## 2015-10-11 NOTE — ED Notes (Signed)
Vistaril and Robitussin given as requested.

## 2015-10-11 NOTE — ED Notes (Signed)
Pt returned from shower w/sitter and security. 

## 2015-10-12 LAB — CBG MONITORING, ED
Glucose-Capillary: 148 mg/dL — ABNORMAL HIGH (ref 65–99)
Glucose-Capillary: 313 mg/dL — ABNORMAL HIGH (ref 65–99)
Glucose-Capillary: 215 mg/dL — ABNORMAL HIGH (ref 65–99)
Glucose-Capillary: 232 mg/dL — ABNORMAL HIGH (ref 65–99)
Glucose-Capillary: 351 mg/dL — ABNORMAL HIGH (ref 65–99)

## 2015-10-12 MED ORDER — MENTHOL 3 MG MT LOZG
1.0000 | LOZENGE | OROMUCOSAL | Status: DC | PRN
  Administered 2015-10-12: 3 mg via ORAL
  Filled 2015-10-12: qty 9

## 2015-10-12 MED ORDER — INSULIN ASPART 100 UNIT/ML ~~LOC~~ SOLN
10.0000 [IU] | Freq: Three times a day (TID) | SUBCUTANEOUS | Status: DC
Start: 2015-10-13 — End: 2015-10-13
  Administered 2015-10-12 – 2015-10-13 (×3): 10 [IU] via SUBCUTANEOUS
  Filled 2015-10-12 (×2): qty 1

## 2015-10-12 NOTE — ED Notes (Signed)
CHECKED CBG 148, RN CRICKET

## 2015-10-12 NOTE — ED Notes (Signed)
Pt given decaf coffee with splenda and cream per request while waiting for breakfast tray. Pt calm and cooperative, watching TV, sitter at bedside

## 2015-10-12 NOTE — ED Notes (Signed)
[name] CBG result was 232. Informed Clydie BraunKaren - RN.

## 2015-10-12 NOTE — ED Notes (Signed)
Bed: [facility]38 Expected date: [facility]/12/15 [facility]:  [facility] [place] [facility]

## 2015-10-12 NOTE — ED Notes (Signed)
Gave pt graham crackers, per Liz BeachGabe - RN.

## 2015-10-12 NOTE — ED Notes (Addendum)
Patient ambulatory to restroom with steady gait.

## 2015-10-12 NOTE — Progress Notes (Signed)
Inpatient Diabetes Program Recommendations  AACE/ADA: New Consensus Statement on Inpatient Glycemic Control (2015)  Target Ranges:  Prepandial:   less than 140 mg/dL      Peak postprandial:   less than 180 mg/dL (1-2 hours)      Critically ill patients:  140 - 180 mg/dL   Results for Carolyn Ayala, Lorna DENISE (MRN [id number]) as of 10/12/2015 10:47  Ref. Range 10/11/2015 05:24 10/11/2015 11:53 10/11/2015 17:56 10/11/2015 22:33 10/12/2015 01:04 10/12/2015 07:58  Glucose-Capillary Latest Ref Range: 65-99 mg/dL 742293 (H) 595248 (H) 638369 (H) 265 (H) 148 (H) 313 (H)   Review of Glycemic Control  Diabetes history: DM  Outpatient Diabetes medications: Lantus 55 units QHS, Novolog 15 units TID meal coverage Current orders for Inpatient glycemic control: Lantus 60 units BID, Novolog Resistant + HS scale  Inpatient Diabetes Program Recommendations: Insulin - Meal Coverage: Glucose increased significantly during meals patient has been on 15 units of meal coverage [facility]. Please consider Novolog 10 units TID meal coverage if patient consumes at least 50% of meals.  Thanks,  Christena DeemShannon Jacquese Cassarino RN, MSN, [facility] Inpatient Diabetes Coordinator Team Pager [phone] (8a-5p)

## 2015-10-13 ENCOUNTER — Telehealth: Payer: Self-pay

## 2015-10-13 DIAGNOSIS — F333 Major depressive disorder, recurrent, severe with psychotic symptoms: Secondary | ICD-10-CM | POA: Diagnosis not present

## 2015-10-13 DIAGNOSIS — Z8659 Personal history of other mental and behavioral disorders: Secondary | ICD-10-CM | POA: Insufficient documentation

## 2015-10-13 DIAGNOSIS — R45851 Suicidal ideations: Secondary | ICD-10-CM | POA: Diagnosis not present

## 2015-10-13 LAB — CBG MONITORING, ED
Glucose-Capillary: 182 mg/dL — ABNORMAL HIGH (ref 65–99)
Glucose-Capillary: 228 mg/dL — ABNORMAL HIGH (ref 65–99)
Glucose-Capillary: 327 mg/dL — ABNORMAL HIGH (ref 65–99)

## 2015-10-13 MED ORDER — ACETAMINOPHEN 325 MG PO TABS
650.0000 mg | ORAL_TABLET | Freq: Once | ORAL | Status: AC
  Administered 2015-10-13: 650 mg via ORAL
  Filled 2015-10-13: qty 2

## 2015-10-13 MED ORDER — METOCLOPRAMIDE HCL 10 MG PO TABS: 10.0000 mg | ORAL_TABLET | Freq: Three times a day (TID) | ORAL | Status: DC

## 2015-10-13 MED ORDER — INSULIN ASPART 100 UNIT/ML ~~LOC~~ SOLN: 8.0000 [IU] | Freq: Three times a day (TID) | SUBCUTANEOUS | Status: DC

## 2015-10-13 MED ORDER — PANTOPRAZOLE SODIUM 40 MG PO TBEC: 40.0000 mg | DELAYED_RELEASE_TABLET | Freq: Two times a day (BID) | ORAL | Status: DC

## 2015-10-13 MED ORDER — SIMVASTATIN 20 MG PO TABS: 20.0000 mg | ORAL_TABLET | Freq: Every day | ORAL | Status: DC

## 2015-10-13 MED ORDER — HALOPERIDOL 0.5 MG PO TABS: 0.5000 mg | ORAL_TABLET | Freq: Every day | ORAL | Status: DC

## 2015-10-13 MED ORDER — MIRTAZAPINE 45 MG PO TABS: 45.0000 mg | ORAL_TABLET | Freq: Every day | ORAL | Status: DC

## 2015-10-13 MED ORDER — INSULIN GLARGINE 100 UNIT/ML ~~LOC~~ SOLN: 60.0000 [IU] | Freq: Two times a day (BID) | SUBCUTANEOUS | Status: DC

## 2015-10-13 MED ORDER — PROMETHAZINE HCL 12.5 MG PO TABS: 12.5000 mg | ORAL_TABLET | Freq: Four times a day (QID) | ORAL | Status: DC | PRN

## 2015-10-13 MED ORDER — HALOPERIDOL 1 MG PO TABS
0.5000 mg | ORAL_TABLET | Freq: Every day | ORAL | Status: DC
  Administered 2015-10-13: 0.5 mg via ORAL
  Filled 2015-10-13: qty 1

## 2015-10-13 MED ORDER — LISINOPRIL 10 MG PO TABS: 10.0000 mg | ORAL_TABLET | Freq: Every day | ORAL | Status: DC

## 2015-10-13 MED ORDER — HYDROXYZINE HCL 50 MG PO TABS: 25.0000 mg | ORAL_TABLET | Freq: Four times a day (QID) | ORAL | Status: DC | PRN

## 2015-10-13 NOTE — B[facility] Suicide Risk Assessment (Cosign Needed)
Suicide Risk Assessment  Discharge Assessment   [facility]   Principal Problem: Depression, major, recurrent, severe with psychosis [facility](HCC) Discharge Diagnoses:  Patient Active Problem List   Diagnosis Date Noted  . Alcohol use disorder, moderate, dependence (HCC) [F10.20] 08/10/2015    Priority: High  . 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 History of command hallucinations [Z86.59]   . Suicidal thoughts [R45.851]   . Abdominal pain [R10.9]   . Diabetic ketoacidosis without coma associated with type 2 diabetes mellitus (HCC) [E13.10]   . Gastroparesis [K31.84]   . Hypokalemia [E87.6]   . DKA (diabetic ketoacidoses) (HCC) [E13.10] 09/24/2015  . HTN (hypertension) [I10] 09/24/2015  . Diabetes mellitus without complication (HCC) [E11.9]   . Hyperlipidemia [E78.5] 08/10/2015  . Neuropathic pain [M79.2] 08/08/2015  . Seasonal allergic rhinitis [J30.2] 08/08/2015  . Uncontrolled diabetes mellitus with neurologic complication, with long-term current use of insulin (HCC) [E11.49, Z79.4, E11.65]   . Uncontrolled diabetes mellitus with diabetic neuropathy, with long-term current use of insulin (HCC) [E11.40, Z79.4, E11.65] 04/21/2015  . Generalized anxiety disorder [F41.1] 02/13/2013  . Chronic posttraumatic stress disorder [F43.12] 02/13/2013  . Substance induced mood disorder (HCC) [F19.94] 02/08/2013  . IDDM (insulin dependent diabetes mellitus) (HCC) [E11.9, Z79.4] 02/08/2013    Total Time spent with patient: 20 minutes  Musculoskeletal: Strength & Muscle Tone: within normal limits Gait & Station: normal Patient leans: N/A  Psychiatric Specialty Exam:   Blood pressure 121/79, pulse 111, temperature 97.5 F (36.4 C), temperature source Oral, resp. rate 18, height 5\' 6"  (1.676 m), weight 89.557 kg (197 lb 7 oz), last menstrual period 09/24/2015, SpO2 98 %.Body mass index is 31.88 kg/(m^2).   General Appearance: Casual  Eye Contact::  Good  Speech:  Clear and Coherent  Volume:  Normal  Mood:  Anxious  Affect:  Congruent  Thought Process:  Coherent, Goal Directed and Intact  Orientation:  Full (Time, Place, and Person)  Thought Content:  WDL  Suicidal Thoughts:  No  Homicidal Thoughts:  No  Memory:  Immediate;   Good Recent;   Good Remote;   Good  Judgement:  Fair  Insight:  Good  Psychomotor Activity:  Normal  Concentration:  Good  Recall:  Fair  Fund of Knowledge:Fair  Language: Good  Akathisia:  NA  Handed:  Right  AIMS (if indicated):     Assets:  Desire for Improvement Housing Transportation Vocational/Educational  ADL's:  Intact  Cognition: WNL     Mental Status Per Nursing Assessment::   On Admission:     Demographic Factors:  Low socioeconomic status and Unemployed  Loss Factors: Financial problems/change in socioeconomic status  Historical Factors: Prior suicide attempts and Anniversary of important loss  Risk Reduction Factors:   Living with another person, especially a relative  Continued Clinical Symptoms:  Alcohol/Substance Abuse/Dependencies  Cognitive Features That Contribute To Risk:  Polarized thinking    Suicide Risk:  Minimal: No identifiable suicidal ideation.  Patients presenting with no risk factors but with morbid ruminations; may be classified as minimal risk based on the severity of the depressive symptoms  Follow-up Information    Follow up with [facility]. Go on 10/13/2015.   Why:  After meeting wtih [facility] CM Erskine SquibbJane today, go to get established and get you rmedications, insulin supplies    Contact information:   [address] [place] [id number] [phone]  Plan Of Care/Follow-up recommendations:  Activity:  AS TOLERATED Diet:  REGULAR  Earney Navy, NP    PMHNP-BC 10/13/2015, 2:50 PM

## 2015-10-13 NOTE — Telephone Encounter (Signed)
Met with the patient and Joellyn Quails, RN CM when the patient was in the ED. Explained the services provided at the [facility] including pharmacy assistance, financial counseling and primary care.  The patient currently does not have any insurance and explained to her the importance of meeting with the [facility] financial counselor to determine if she is eligible for the [employer].  She stated that she has not had any behavioral health follow up in the community and does not have a PCP.  She is interested in scheduling a hospital follow up appointment at the [facility].  At the time of discharge there were not any appointments available and the patient was instructed to call the clinic to check for appointment availability.  She was provided the phone # for the [facility]. She said that she has been told that she is not eligible for medicaid but is willing to check again with DSS. She does receive $194/month in food stamps.   It was also explained to her that the [facility] could provide transportation for her to her appointments at the clinic if needed. She then noted that she did not have a place to go to when discharged. She said that the only option is to stay with a friend - Lonna Cobb # [phone] in [place].  She said that her friend has encouraged her to stay with her and to help her care for her Staci Righter) children.  The patient was not sure if she would be able to help watch/supervise the children but she noted that she has no other options. She said that she does not have any family that she can stay with in [place] or [place] areas. She said that she stayed with a friend prior to her hospitalization but will not go back there to stay with him. She stated that she just spoke to her Myrtis Hopping, this morning and thinks that is where she will go.  The patient also noted that she does not have any transportation and only has about $1.00 in cash. No other funds available. Joellyn Quails, RN CM to  consult SW regarding housing and transportation needs.   Explained the services offered at the [facility] as well as shelter options at [employer].    She noted that she needs her insulin and syringes/needles and test strips for her Contour glucometer.  She is willing to have her prescriptions filled at the [facility] pharmacy.  This CM confirmed with [facility] pharmacy tech that atarax, remeron ( 538m and 362m as well as haldol ( but not 0.38m238mare available at the [facility]. The pharmacy also has lantus and novolog insulin pens that could be provided if ordered. There are samples as well as the pharmacy assistance program to help the patient obtain her medications. The pharmacy does not have test strips for the contour meter; but does have the True Metrix meter and testing supplies. Update provided to K. GibLavina Hamman The patient was given paper prescriptions and the [facility].  The patient does not have a phone and stated that the best way to reach her is to call her friend, KeeMyrtis Hoppinghe was very appreciative of all of the information provided/discussed.

## 2015-10-13 NOTE — Telephone Encounter (Signed)
After the patient was discharged, a hospital follow up  [facility]  - 10/20/2015 @ 1030 with Dr Venetia NightAmao. As per Marval RegalKim Gibbs, RN CM the patient was given [facility] pass [facility] her [facility] at the pharmacy and then was given money for PART [place].   Cheron EverySajal Shah, [facility] pharmacist confirmed receipt of the prescriptions and will notify the CM when patient arrives to inform her ( patient) of the scheduled appointment on 10/20/2015.

## 2015-10-13 NOTE — Progress Notes (Signed)
ED CM spoke with Carolyn SquibbJane [facility] Cm to see if pt can be seen prior to d/c today

## 2015-10-13 NOTE — ED Notes (Signed)
Patient has glasses, glasses case, magazine, and papers in the room.

## 2015-10-13 NOTE — ED Notes (Signed)
Pt discharged ambulatory after reviewing discharge instructions and prescriptions.  Pt was given bus pass and part bus money.  Pt was in no distress and walked out ambulatory.

## 2015-10-13 NOTE — Progress Notes (Addendum)
ED CM and [place] CM Aura Camps with pt together to discuss the need to become active with [facility] pcp/pharmacy/SW and IRC/Urban ministries During the meeting with the pt she mentions that she has a friend Biomedical engineer Reynard which she hs called from [employer] phone this am about staying with her in [place] this friend has asked her to stay with her to assist with "keeping her kids"  Her # [phone] Kids (67 month old, 2, 3, and can not recall age of oldest) Pt states "we buried my mother in [place]" but does not share a name of family or clarify the "we"  Pt looked sad and down at her bed linen when speaking of her mother Pt had previously been voicing appreciative statement of about the services and resources being reviewed   CM discussed and provided written information for uninsured accepting pcps, discussed the importance of pcp vs EDP services for f/u care, www.needymeds.org, www.goodrx.com, discounted pharmacies and other [employer] such as [employer], affordable care act, financial assistance, uninsured dental services, [place] med assist, DSS and  health department  Reviewed resources for [employer] uninsured accepting pcps like Jinny Blossom, family medicine at [employer], [facility], [facility], local urgent care centers, [facility], [facility] family practice, general medical clinics, family services of the [place], [facility] urgent care plus others, medication resources, [facility] out patient pharmacies and housing Pt voiced understanding and appreciation of resources provided  Resources reviewed included [place] pc, pharmacy, sw , med assist program, and assist with transportation and supplies for DM Pt provided with [employer] of pantries, and places to get food  Pt admitted to being homeless, not wanting to return to [place] and tell [facility] staff she had an uncle who she could stay with in [place] admits to CMs that the female was  "a man I met in chat and convinced him to lie for me.  When I got here I did not go stay with him. I slept in the woods."   With teach back method Cms had pt to confirm her first goal today is to become an active [place] pt and to go to [place] to be with her friend via PART Pt other option is to get to [place] Pt agreed to this  While ED CM was speaking with ED SW about a possible PART pass, [place] Cm states pt spoke with her about not wanting have to sleep with men to get a place to stay CMs spoke with [facility] team about pt [place] recommendation, homelessness and need to get assist with pt Rx(s) to take to [facility]

## 2015-10-13 NOTE — Progress Notes (Addendum)
ED Cm contacted by TTS staff Tom [facility] get management of glucose prior to placement CM spoke with EDP covering for [facility] medical, Dr Freida BusmanAllen who states [facility] Providers need to consult Hospitalist to manage pt  Returned call to TTS staff to discuss that Dr Freida BusmanAllen states [facility] providers need to consult hospitalist to manage pt chronic diabetes issues, EDP manage acute

## 2015-10-13 NOTE — Progress Notes (Signed)
Contacted to be updated by TTS that Pt has been seen by Hospitalist for DM management and scripts are ready

## 2015-10-13 NOTE — Progress Notes (Signed)
CSW was [facility] by [facility] that patient admitted she is homeless. EDCM stated patient states she has a friend [place]. EDCM stated patient would need a park bus pass and a bus pass.   CSW spoke with Asst. Social Work Interior and spatial designerDirector to see if patient would be allowed a park bus pass. He approved for CSW to provide patient with four dollars for the park bus pass. CSW gave patient's nurse four dollars and a bus pass for patient.   Elenore PaddyLaVonia Kiante Ciavarella, LCSWA [phone] ED CSW 10/13/2015 3:15 PM

## 2015-10-13 NOTE — Progress Notes (Addendum)
Pt discussed in [facility] progression Pt for d/c needing assistance with medications and listed as uninsured without pcp  Cm [facility] has attempted to reach pt without success - Cm was in formed that listed number was incorrect for pt _[facility] Cm has attempt to meet with pt while hospitalized to offer services but [facility] tells [facility] CM she is not feeling well and did not want to talk on 09/28/15, 09/29/15 and 09/30/15

## 2015-10-13 NOTE — Consult Note (Signed)
[facility] Face-to-Face Psychiatry Consult   Reason for Consult:  Homelessness, Suicide thought Referring Physician: EDP Patient Identification: Carolyn Ayala MRN:  [id number] Principal Diagnosis: Depression, major, recurrent, severe with psychosis (HCC) Diagnosis:   Patient Active Problem List   Diagnosis Date Noted  . Alcohol use disorder, moderate, dependence (HCC) [F10.20] 08/10/2015    Priority: High  . 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 Abdominal pain [R10.9]   . Diabetic ketoacidosis without coma associated with type 2 diabetes mellitus (HCC) [E13.10]   . Gastroparesis [K31.84]   . Hypokalemia [E87.6]   . DKA (diabetic ketoacidoses) (HCC) [E13.10] 09/24/2015  . HTN (hypertension) [I10] 09/24/2015  . Diabetes mellitus without complication (HCC) [E11.9]   . Hyperlipidemia [E78.5] 08/10/2015  . Neuropathic pain [M79.2] 08/08/2015  . Seasonal allergic rhinitis [J30.2] 08/08/2015  . Uncontrolled diabetes mellitus with neurologic complication, with long-term current use of insulin (HCC) [E11.49, Z79.4, E11.65]   . Uncontrolled diabetes mellitus with diabetic neuropathy, with long-term current use of insulin (HCC) [E11.40, Z79.4, E11.65] 04/21/2015  . Generalized anxiety disorder [F41.1] 02/13/2013  . Chronic posttraumatic stress disorder [F43.12] 02/13/2013  . Substance induced mood disorder (HCC) [F19.94] 02/08/2013  . IDDM (insulin dependent diabetes mellitus) (HCC) [E11.9, Z79.4] 02/08/2013    Total Time spent with patient: 45 minutes  Subjective:   Carolyn Ayala is a 40 y.o. female patient admitted with Homelessness, Suicide.  HPI:  AA female, 40 years old well known to the service was evaluated today for suicide thought with plan to OD on her medications.  She states she feels suicidal because of her homelessness and not been able to deal with her mother's death last year.  She reports hearing voices  telling her to kill herself.  [facility] she [facility].  Patient  Has no place to stay and reports that she has not been taking her MH medications because she felt suicidal while taking her Celexa.   Patient also reports that she has been self medication with Alcohol and not taking her medications.  Today she denies SI/HI/AVH but is asking fort assistance with her medications and housing.  [facility] and go to her friends house.  Patient is discharged and her [facility].  Past Psychiatric History:  GAD, MDD,   Risk to Self: Suicidal Ideation: Yes-Currently Present Suicidal Intent: Yes-Currently Present Is patient at risk for suicide?: Yes Suicidal Plan?: Yes-Currently Present Specify Current Suicidal Plan: to overdose Access to Means: Yes Specify Access to Suicidal Means: access to pills What has been your use of drugs/alcohol within the last 12 months?: alcohol How many times?: 1 Other Self Harm Risks: NA Triggers for Past Attempts: Hallucinations, Other (Comment) Intentional Self Injurious Behavior: None Risk to Others: Homicidal Ideation: No Thoughts of Harm to Others: No Current Homicidal Intent: No Current Homicidal Plan: No Access to Homicidal Means: No Identified Victim: NA History of harm to others?: No Assessment of Violence: None Noted Violent Behavior Description: NA Does patient have access to weapons?: No Criminal Charges Pending?: No Does patient have a court date: No Prior Inpatient Therapy: Prior Inpatient Therapy: Yes Prior Therapy Dates: [facility], multiple admits Prior [facility] Reason for Treatment: Depression, alcohol abuse Prior Outpatient Therapy: Prior Outpatient Therapy: No Prior Therapy Dates: na Prior Therapy Facilty/Provider(s): na  Reason for Treatment: na Does patient have an ACCT  team?: No Does patient have Intensive In-House Services?  : No Does patient have [facility] services? : No Does patient have P4CC services?: No  Past Medical History:  Past Medical History  Diagnosis Date  . Mental disorder   . Depression   . Diabetes mellitus without complication (HCC)   . Hypertension   . Noncompliance     Past Surgical History  Procedure Laterality Date  . Cesarean section     Family History:  Family History  Problem Relation Age of Onset  . Depression Mother    Family Psychiatric  History:  Unknown Social History:  History  Alcohol Use  . Yes    Comment: daily     History  Drug Use No    Social History   Social History  . Marital Status: Single    Spouse Name: N/A  . Number of Children: N/A  . Years of Education: N/A   Social History Main Topics  . Smoking status: Never Smoker   . Smokeless tobacco: Current User  . Alcohol Use: Yes     Comment: daily  . Drug Use: No  . Sexual Activity: No   Other Topics Concern  . None   Social History Narrative   ** Merged History Encounter **       Additional Social History:    Allergies:  No Known Allergies  Labs:  Results for orders placed or performed during the hospital encounter of 10/08/15 (from the past 48 hour(s))  CBG monitoring, ED     Status: Abnormal   Collection Time: 10/11/15  5:56 PM  Result Value Ref Range   Glucose-Capillary 369 (H) 65 - 99 mg/dL  CBG monitoring, ED     Status: Abnormal   Collection Time: 10/11/15 10:33 PM  Result Value Ref Range   Glucose-Capillary 265 (H) 65 - 99 mg/dL  CBG monitoring, ED     Status: Abnormal   Collection Time: 10/12/15  1:04 AM  Result Value Ref Range   Glucose-Capillary 148 (H) 65 - 99 mg/dL   Comment 1 Notify RN   CBG monitoring, ED     Status: Abnormal   Collection Time: 10/12/15  7:58 AM  Result Value Ref Range   Glucose-Capillary 313 (H) 65 - 99 mg/dL  CBG monitoring, ED     Status: Abnormal   Collection Time: 10/12/15 10:56  AM  Result Value Ref Range   Glucose-Capillary 215 (H) 65 - 99 mg/dL  CBG monitoring, ED     Status: Abnormal   Collection Time: 10/12/15 11:53 AM  Result Value Ref Range   Glucose-Capillary 232 (H) 65 - 99 mg/dL   Comment 1 Notify RN    Comment 2 Document in Chart   CBG monitoring, ED     Status: Abnormal   Collection Time: 10/12/15  6:03 PM  Result Value Ref Range   Glucose-Capillary 351 (H) 65 - 99 mg/dL  CBG monitoring, ED     Status: Abnormal   Collection Time: 10/12/15 10:41 PM  Result Value Ref Range   Glucose-Capillary 182 (H) 65 - 99 mg/dL  CBG monitoring, ED     Status: Abnormal   Collection Time: 10/13/15  8:04 AM  Result Value Ref Range   Glucose-Capillary 228 (H) 65 - 99 mg/dL  CBG monitoring, ED     Status: Abnormal   Collection Time: 10/13/15 12:42 PM  Result Value Ref Range   Glucose-Capillary 327 (H) 65 -  99 mg/dL    Current Facility-Administered Medications  Medication Dose Route Frequency Provider Last Rate Last Dose  . gabapentin (NEURONTIN) capsule 300 mg  300 mg Oral TID Layla Maw Ward, DO   300 mg at 10/13/15 0917  . guaiFENesin (ROBITUSSIN) 100 MG/5ML solution 100 mg  5 mL Oral Q6H PRN Lyndal Pulley, MD   100 mg at 10/11/15 1155  . haloperidol (HALDOL) tablet 0.5 mg  0.5 mg Oral Q1200 Nikala Walsworth, MD   0.5 mg at 10/13/15 1254  . hydrOXYzine (ATARAX/VISTARIL) tablet 50 mg  50 mg Oral Q6H PRN Kristen N Ward, DO   50 mg at 10/13/15 0818  . insulin aspart (novoLOG) injection 0-20 Units  0-20 Units Subcutaneous TID WC Lyndal Pulley, MD   7 Units at 10/13/15 [phone]  . insulin aspart (novoLOG) injection 0-5 Units  0-5 Units Subcutaneous QHS Lyndal Pulley, MD   3 Units at 10/11/15 2344  . insulin aspart (novoLOG) injection 10 Units  10 Units Subcutaneous TID WC Loren Racer, MD   10 Units at 10/13/15 0800  . insulin glargine (LANTUS) injection 60 Units  60 Units Subcutaneous BID Kristen N Ward, DO   60 Units at 10/13/15 1002  . lisinopril (PRINIVIL,ZESTRIL)  tablet 10 mg  10 mg Oral Daily Kristen N Ward, DO   10 mg at 10/13/15 0917  . LORazepam (ATIVAN) tablet 1 mg  1 mg Oral Q8H PRN Lorre Nick, MD   1 mg at 10/12/15 1647  . menthol-cetylpyridinium (CEPACOL) lozenge 3 mg  1 lozenge Oral PRN Loren Racer, MD   3 mg at 10/12/15 2024  . metoCLOPramide (REGLAN) tablet 10 mg  10 mg Oral TID AC & HS Kristen N Ward, DO   10 mg at 10/13/15 1254  . mirtazapine (REMERON) tablet 45 mg  45 mg Oral QHS Kristen N Ward, DO   45 mg at 10/12/15 2243  . multivitamin with minerals tablet 1 tablet  1 tablet Oral Daily Kristen N Ward, DO   1 tablet at 10/13/15 0917  . pantoprazole (PROTONIX) EC tablet 40 mg  40 mg Oral BID WC Kristen N Ward, DO   40 mg at 10/13/15 0820  . promethazine (PHENERGAN) tablet 12.5 mg  12.5 mg Oral Q6H PRN Kristen N Ward, DO   12.5 mg at 10/08/15 1952  . simvastatin (ZOCOR) tablet 20 mg  20 mg Oral q1800 Kristen N Ward, DO   20 mg at 10/12/15 1838  . thiamine (VITAMIN B-1) tablet 100 mg  100 mg Oral Daily Kristen N Ward, DO   100 mg at 10/13/15 9528   Current Outpatient Prescriptions  Medication Sig Dispense Refill  . gabapentin (NEURONTIN) 300 MG capsule Take 1 capsule (300 mg total) by mouth 3 (three) times daily. (Patient taking differently: Take 900 mg by mouth 3 (three) times daily. ) 90 capsule 0  . hydrOXYzine (ATARAX/VISTARIL) 50 MG tablet Take 1 tablet (50 mg total) by mouth every 6 (six) hours as needed for anxiety. 30 tablet 0  . insulin aspart (NOVOLOG) 100 UNIT/ML injection Inject 8 Units into the skin 3 (three) times daily with meals. (Patient taking differently: Inject 12 Units into the skin 3 (three) times daily with meals. ) 10 mL 3  . insulin glargine (LANTUS) 100 UNIT/ML injection Inject 0.6 mLs (60 Units total) into the skin 2 (two) times daily. 10 mL 11  . lisinopril (PRINIVIL,ZESTRIL) 10 MG tablet Take 10 mg by mouth daily.    . metoCLOPramide (REGLAN) 10 MG  tablet Take 1 tablet (10 mg total) by mouth 4 (four) times  daily -  before meals and at bedtime. 120 tablet 1  . mirtazapine (REMERON) 45 MG tablet Take 1 tablet (45 mg total) by mouth at bedtime. 30 tablet 0  . Multiple Vitamin (MULTIVITAMIN WITH MINERALS) TABS tablet Take 1 tablet by mouth daily.    . pantoprazole (PROTONIX) 40 MG tablet Take 1 tablet (40 mg total) by mouth 2 (two) times daily with a meal. 60 tablet 1  . promethazine (PHENERGAN) 12.5 MG tablet Take 1 tablet (12.5 mg total) by mouth every 6 (six) hours as needed for nausea or vomiting. 30 tablet 0  . simvastatin (ZOCOR) 20 MG tablet Take 1 tablet (20 mg total) by mouth daily at 6 PM. 30 tablet 0  . Thiamine Mononitrate (VITAMIN B1 PO) Take 1 tablet by mouth daily.    Marland Kitchen VITAMIN E PO Take 2 tablets by mouth daily.    . haloperidol (HALDOL) 0.5 MG tablet Take 1 tablet (0.5 mg total) by mouth daily at 12 noon. 30 tablet 0  . hydrOXYzine (ATARAX/VISTARIL) 50 MG tablet Take 0.5 tablets (25 mg total) by mouth every 6 (six) hours as needed for anxiety. 30 tablet 0  . mirtazapine (REMERON) 45 MG tablet Take 1 tablet (45 mg total) by mouth at bedtime. 30 tablet 0    Musculoskeletal: Strength & Muscle Tone: within normal limits Gait & Station: normal Patient leans: N/A  Psychiatric Specialty Exam: Review of Systems  Constitutional: Negative.   HENT: Negative.   Eyes: Negative.   Respiratory: Negative.   Cardiovascular: Negative.   Gastrointestinal: Negative.   Genitourinary: Negative.   Musculoskeletal: Negative.   Skin: Negative.   Neurological: Negative.   Endo/Heme/Allergies:       Hx of insulin dependent DM    Blood pressure 121/79, pulse 111, temperature 97.5 F (36.4 C), temperature source Oral, resp. rate 18, height 5\' 6"  (1.676 m), weight 89.557 kg (197 lb 7 oz), last menstrual period 09/24/2015, SpO2 98 %.Body mass index is 31.88 kg/(m^2).  General Appearance: Casual  Eye Contact::  Good  Speech:  Clear and Coherent  Volume:  Normal  Mood:  Anxious  Affect:  Congruent   Thought Process:  Coherent, Goal Directed and Intact  Orientation:  Full (Time, Place, and Person)  Thought Content:  WDL  Suicidal Thoughts:  No  Homicidal Thoughts:  No  Memory:  Immediate;   Good Recent;   Good Remote;   Good  Judgement:  Fair  Insight:  Good  Psychomotor Activity:  Normal  Concentration:  Good  Recall:  Fair  Fund of Knowledge:Fair  Language: Good  Akathisia:  NA  Handed:  Right  AIMS (if indicated):     Assets:  Desire for Improvement Housing Transportation Vocational/Educational  ADL's:  Intact  Cognition: WNL  Sleep:      Disposition:   Discharge home, Case management is involvement with  medication assistance at [facility]  Earney Navy, NP   PMHNP-BC 10/13/2015 12:57 PM Patient seen face-to-face for psychiatric evaluation, chart reviewed and case discussed with the physician extender and developed treatment plan. Reviewed the information documented and agree with the treatment plan. Thedore Mins, MD

## 2015-10-13 NOTE — ED Notes (Signed)
Patient reports anxiety and depression. Tachy. Denies SI, HI, AVH. Reports that she is able to sleep with her medications. Patient states that she feels CBGs have been elevated due to Abilify.  Encouragement offered. Oriented to the unit. Environment adjusted.  Q 15 safety checks in place.

## 2015-10-15 ENCOUNTER — Telehealth: Payer: Self-pay

## 2015-10-15 NOTE — Telephone Encounter (Signed)
This Case Manager placed call to patient's friend, Reinaldo BerberKeeva #[phone], to attempt to reach patient as patient does not have a phone.  This Case Manager wanted to inform patient that an appointment was scheduled for her on 10/20/15 at 1030 with Dr. Venetia NightAmao. Also wanted to remind patient to [facility]. [facility] or patient at above number and unable to leave voicemail as voicemail not set-up.

## 2015-10-19 ENCOUNTER — Telehealth: Payer: Self-pay

## 2015-10-19 NOTE — Telephone Encounter (Signed)
Attempted to contact the patient to check on her status and to inform her of [facility] tomorrow, 10/20/15 @ 1030. When in the ED, the patient had informed this CM that she does not have a phone and the only way to reach her is through her friend, Rolla PlateKeeva Renard # [phone]. A call was placed to that # twice and both times is rang 8 times and then ended. No option to leave a message. The patient's appointment will be cancelled at this time.  Will need to be rescheduled when the patient contacts the [facility].

## 2015-10-20 ENCOUNTER — Inpatient Hospital Stay: Payer: Self-pay | Admitting: Family Medicine

## 2015-10-20 ENCOUNTER — Encounter (HOSPITAL_COMMUNITY): Payer: Self-pay

## 2015-10-20 ENCOUNTER — Emergency Department (HOSPITAL_COMMUNITY)
Admission: EM | Admit: 2015-10-20 | Discharge: 2015-10-21 | Disposition: A | Discharge status: Home / Self Care | Payer: Federal, State, Local not specified - Other | Attending: Emergency Medicine | Admitting: Emergency Medicine

## 2015-10-20 DIAGNOSIS — R0602 Shortness of breath: Secondary | ICD-10-CM | POA: Insufficient documentation

## 2015-10-20 DIAGNOSIS — E1165 Type 2 diabetes mellitus with hyperglycemia: Secondary | ICD-10-CM

## 2015-10-20 DIAGNOSIS — I10 Essential (primary) hypertension: Secondary | ICD-10-CM | POA: Insufficient documentation

## 2015-10-20 DIAGNOSIS — E119 Type 2 diabetes mellitus without complications: Secondary | ICD-10-CM | POA: Insufficient documentation

## 2015-10-20 DIAGNOSIS — R112 Nausea with vomiting, unspecified: Secondary | ICD-10-CM | POA: Insufficient documentation

## 2015-10-20 DIAGNOSIS — M79672 Pain in left foot: Secondary | ICD-10-CM | POA: Insufficient documentation

## 2015-10-20 DIAGNOSIS — Z79899 Other long term (current) drug therapy: Secondary | ICD-10-CM | POA: Insufficient documentation

## 2015-10-20 DIAGNOSIS — Z794 Long term (current) use of insulin: Secondary | ICD-10-CM | POA: Insufficient documentation

## 2015-10-20 DIAGNOSIS — Z3202 Encounter for pregnancy test, result negative: Secondary | ICD-10-CM | POA: Insufficient documentation

## 2015-10-20 DIAGNOSIS — M79671 Pain in right foot: Secondary | ICD-10-CM | POA: Insufficient documentation

## 2015-10-20 DIAGNOSIS — Z9119 Patient's noncompliance with other medical treatment and regimen: Secondary | ICD-10-CM | POA: Insufficient documentation

## 2015-10-20 DIAGNOSIS — F329 Major depressive disorder, single episode, unspecified: Secondary | ICD-10-CM | POA: Insufficient documentation

## 2015-10-20 LAB — COMPREHENSIVE METABOLIC PANEL
ALT: 42 U/L (ref 14–54)
AST: 35 U/L (ref 15–41)
Albumin: 3.7 g/dL (ref 3.5–5.0)
Alkaline Phosphatase: 81 U/L (ref 38–126)
Anion gap: 13 (ref 5–15)
BUN: 11 mg/dL (ref 6–20)
CO2: 21 mmol/L — ABNORMAL LOW (ref 22–32)
Calcium: 9.1 mg/dL (ref 8.9–10.3)
Chloride: 106 mmol/L (ref 101–111)
Creatinine, Ser: 0.65 mg/dL (ref 0.44–1.00)
GFR calc Af Amer: >60 mL/min (ref >60)
GFR calc non Af Amer: >60 mL/min (ref >60)
Glucose, Bld: 235 mg/dL — ABNORMAL HIGH (ref 65–99)
Potassium: 4 mmol/L (ref 3.5–5.1)
Sodium: 140 mmol/L (ref 135–145)
Total Bilirubin: 0.6 mg/dL (ref 0.3–1.2)
Total Protein: 7.3 g/dL (ref 6.5–8.1)

## 2015-10-20 LAB — CBC
HCT: 37.5 % (ref 36.0–46.0)
Hemoglobin: 11.7 g/dL — ABNORMAL LOW (ref 12.0–15.0)
MCH: 24.2 pg — ABNORMAL LOW (ref 26.0–34.0)
MCHC: 31.2 g/dL (ref 30.0–36.0)
MCV: 77.5 fL — ABNORMAL LOW (ref 78.0–100.0)
Platelets: 435 10*3/uL — ABNORMAL HIGH (ref 150–400)
RBC: 4.84 MIL/uL (ref 3.87–5.11)
RDW: 16.7 % — ABNORMAL HIGH (ref 11.5–15.5)
WBC: 7 10*3/uL (ref 4.0–10.5)

## 2015-10-20 LAB — BASIC METABOLIC PANEL
Anion gap: 8 (ref 5–15)
BUN: 10 mg/dL (ref 6–20)
CO2: 22 mmol/L (ref 22–32)
Calcium: 8.5 mg/dL — ABNORMAL LOW (ref 8.9–10.3)
Chloride: 109 mmol/L (ref 101–111)
Creatinine, Ser: 0.66 mg/dL (ref 0.44–1.00)
GFR calc Af Amer: >60 mL/min (ref >60)
GFR calc non Af Amer: >60 mL/min (ref >60)
Glucose, Bld: 215 mg/dL — ABNORMAL HIGH (ref 65–99)
Potassium: 3.9 mmol/L (ref 3.5–5.1)
Sodium: 139 mmol/L (ref 135–145)

## 2015-10-20 LAB — LIPASE, BLOOD: Lipase: 19 U/L (ref 11–51)

## 2015-10-20 LAB — I-STAT TROPONIN, ED: Troponin i, poc: 0 ng/mL (ref 0.00–0.08)

## 2015-10-20 LAB — I-STAT BETA HCG BLOOD, ED (MC, WL, AP ONLY): I-stat hCG, quantitative: <5 m[IU]/mL (ref <5)

## 2015-10-20 MED ORDER — SODIUM CHLORIDE 0.9 % IV BOLUS (SEPSIS)
1000.0000 mL | Freq: Once | INTRAVENOUS | Status: AC
  Administered 2015-10-20: 1000 mL via INTRAVENOUS

## 2015-10-20 MED ORDER — ONDANSETRON HCL 4 MG/2ML IJ SOLN: 4.0000 mg | Freq: Once | INTRAMUSCULAR | Status: DC

## 2015-10-20 MED ORDER — METOCLOPRAMIDE HCL 5 MG/ML IJ SOLN
10.0000 mg | Freq: Once | INTRAMUSCULAR | Status: AC
  Administered 2015-10-20: 10 mg via INTRAVENOUS
  Filled 2015-10-20: qty 2

## 2015-10-20 NOTE — ED Notes (Signed)
Upon entering room to DC pt, pt refusing to sign electronic pad for registration. Pt making no sense when communicating. Pt not opening eyes. Pt SO at bedside giving no information. EDP aware. Per nurse first pt to be escorted out by security.

## 2015-10-20 NOTE — ED Provider Notes (Signed)
CSN: [id number]     Arrival date & time 10/20/15  1751 History   First MD Initiated Contact with Patient 10/20/15 2022     Chief Complaint  Patient presents with  . Shortness of Breath  . Foot Pain   PT IS HERE WITH N/V.  SHE ALSO WANTED SOME BLOOD BLISTERS ON HER FEET TO BE CHECKED.  PT WAS SUPPOSED TO GO TO [facility], BUT THEY COULD NOT GET AHOLD OF HER, SO IT WAS CANCELLED.  THE PT SAID N/V STARTED AT 1400 TODAY.  PT DENIES ANY SOB.  (Consider location/radiation/quality/duration/timing/severity/associated sxs/prior Treatment) Patient is a 40 y.o. female presenting with shortness of breath and lower extremity pain. The history is provided by the patient.  Shortness of Breath Associated symptoms: vomiting   Foot Pain Associated symptoms include shortness of breath.    Past Medical History  Diagnosis Date  . Mental disorder   . Depression   . Diabetes mellitus without complication (HCC)   . Hypertension   . Noncompliance    Past Surgical History  Procedure Laterality Date  . Cesarean section     Family History  Problem Relation Age of Onset  . Depression Mother    Social History  Substance Use Topics  . Smoking status: Never Smoker   . Smokeless tobacco: Current User  . Alcohol Use: Yes     Comment: daily   OB History    No data available     Review of Systems  Respiratory: Positive for shortness of breath.   Gastrointestinal: Positive for nausea and vomiting.  Musculoskeletal:       BILATERAL FOOT PAIN      Allergies  Review of patient's allergies indicates no known allergies.  Home Medications   Prior to Admission medications   Medication Sig Start Date End Date Taking? Authorizing Provider  gabapentin (NEURONTIN) 300 MG capsule Take 1 capsule (300 mg total) by mouth 3 (three) times daily. Patient taking differently: Take 900 mg by mouth 3 (three) times daily.  10/02/15   Vassie Loll, MD  haloperidol (HALDOL) 0.5 MG tablet Take 1  tablet (0.5 mg total) by mouth daily at 12 noon. 10/13/15   Earney Navy, NP  hydrOXYzine (ATARAX/VISTARIL) 50 MG tablet Take 0.5 tablets (25 mg total) by mouth every 6 (six) hours as needed for anxiety. 10/13/15   Earney Navy, NP  insulin aspart (NOVOLOG) 100 UNIT/ML injection Inject 8 Units into the skin 3 (three) times daily with meals. 10/13/15   Jeralyn Bennett, MD  insulin glargine (LANTUS) 100 UNIT/ML injection Inject 0.6 mLs (60 Units total) into the skin 2 (two) times daily. 10/13/15   Jeralyn Bennett, MD  lisinopril (PRINIVIL,ZESTRIL) 10 MG tablet Take 1 tablet (10 mg total) by mouth daily. 10/13/15   Earney Navy, NP  metoCLOPramide (REGLAN) 10 MG tablet Take 1 tablet (10 mg total) by mouth 4 (four) times daily -  before meals and at bedtime. 10/13/15   Earney Navy, NP  mirtazapine (REMERON) 45 MG tablet Take 1 tablet (45 mg total) by mouth at bedtime. 10/13/15   Earney Navy, NP  Multiple Vitamin (MULTIVITAMIN WITH MINERALS) TABS tablet Take 1 tablet by mouth daily.    Historical Provider, MD  pantoprazole (PROTONIX) 40 MG tablet Take 1 tablet (40 mg total) by mouth 2 (two) times daily with a meal. 10/13/15   Earney Navy, NP  promethazine (PHENERGAN) 12.5 MG tablet Take 1 tablet (12.5 mg total)  by mouth every 6 (six) hours as needed for nausea or vomiting. 10/13/15   Earney NavyJosephine C Onuoha, NP  simvastatin (ZOCOR) 20 MG tablet Take 1 tablet (20 mg total) by mouth daily at 6 PM. 10/13/15   Earney NavyJosephine C Onuoha, NP  Thiamine Mononitrate (VITAMIN B1 PO) Take 1 tablet by mouth daily.    Historical Provider, MD  VITAMIN E PO Take 2 tablets by mouth daily.    Historical Provider, MD   BP 99/61 mmHg  Pulse 113  Temp(Src) 98.6 F (37 C) (Oral)  Resp 18  SpO2 100%  LMP 09/24/2015 Physical Exam  Constitutional: She is oriented to person, place, and time. She appears well-developed and well-nourished.  HENT:  Head: Normocephalic and atraumatic.  Right Ear: External ear normal.   Left Ear: External ear normal.  Mouth/Throat: Oropharynx is clear and moist.  Eyes: Conjunctivae are normal. Pupils are equal, round, and reactive to light.  Neck: Normal range of motion. Neck supple.  Cardiovascular: Normal rate, regular rhythm, normal heart sounds and intact distal pulses.   Pulmonary/Chest: Effort normal and breath sounds normal.  Abdominal: Soft. Bowel sounds are normal.  Musculoskeletal: Normal range of motion.  Neurological: She is alert and oriented to person, place, and time.  Skin: Skin is warm and dry.  Psychiatric: She has a normal mood and affect. Her behavior is normal. Judgment and thought content normal.  Nursing note and vitals reviewed.   ED Course  Procedures (including critical care time) Labs Review Labs Reviewed  COMPREHENSIVE METABOLIC PANEL - Abnormal; Notable for the following:    CO2 21 (*)    Glucose, Bld 235 (*)    All other components within normal limits  CBC - Abnormal; Notable for the following:    Hemoglobin 11.7 (*)    MCV 77.5 (*)    MCH 24.2 (*)    RDW 16.7 (*)    Platelets 435 (*)    All other components within normal limits  LIPASE, BLOOD  URINALYSIS, ROUTINE W REFLEX MICROSCOPIC (NOT AT [facility])  URINE RAPID DRUG SCREEN, HOSP PERFORMED  BASIC METABOLIC PANEL  I-STAT BETA HCG BLOOD, ED ([facility], [facility], [facility] ONLY)  I-STAT TROPOININ, ED    Imaging Review No results found. I have personally reviewed and evaluated these images and lab results as part of my medical decision-making.   EKG Interpretation None      MDM  PT IS FEELING BETTER.  SHE KNOWS TO RETURN IF WORSE. Final diagnoses:  Non-intractable vomiting with nausea, vomiting of unspecified type  Poorly controlled type 2 diabetes mellitus (HCC)        Jacalyn LefevreJulie Nura Cahoon, MD 10/20/15 2319

## 2015-10-20 NOTE — ED Notes (Signed)
Pt here with c/o SOB, onset about 20 minutes PTA associated with nausea and vomiting. Emesis x 6 today. He also is diabetic and has a couple of blood blisters to bilateral feet that she is concerned about. Denies CP.

## 2015-10-20 NOTE — Discharge Instructions (Signed)
Nausea and Vomiting  Nausea means you feel sick to your stomach. Throwing up (vomiting) is a reflex where stomach contents come out of your mouth.  HOME CARE   · Take medicine as told by your doctor.  · Do not force yourself to eat. However, you do need to drink fluids.  · If you feel like eating, eat a normal diet as told by your doctor.    Eat rice, wheat, potatoes, bread, lean meats, yogurt, fruits, and vegetables.    Avoid high-fat foods.  · Drink enough fluids to keep your pee (urine) clear or pale yellow.  · Ask your doctor how to replace body fluid losses (rehydrate). Signs of body fluid loss (dehydration) include:    Feeling very thirsty.    Dry lips and mouth.    Feeling dizzy.    Dark pee.    Peeing less than normal.    Feeling confused.    Fast breathing or heart rate.  GET HELP RIGHT AWAY IF:   · You have blood in your throw up.  · You have black or bloody poop (stool).  · You have a bad headache or stiff neck.  · You feel confused.  · You have bad belly (abdominal) pain.  · You have chest pain or trouble breathing.  · You do not pee at least once every 8 hours.  · You have cold, clammy skin.  · You keep throwing up after 24 to 48 hours.  · You have a fever.  MAKE SURE YOU:   · Understand these instructions.  · Will watch your condition.  · Will get help right away if you are not doing well or get worse.     This information is not intended to replace advice given to you by your health care provider. Make sure you discuss any questions you have with your health care provider.     Document Released: 11/16/2007 Document Revised: 08/22/2011 Document Reviewed: 10/29/2010  Elsevier Interactive Patient Education ©2016 [employer]

## 2017-07-26 ENCOUNTER — Emergency Department (HOSPITAL_COMMUNITY)
Admission: EM | Admit: 2017-07-26 | Discharge: 2017-07-27 | Disposition: A | Discharge status: Home / Self Care | Payer: Self-pay | Attending: Emergency Medicine | Admitting: Emergency Medicine

## 2017-07-26 ENCOUNTER — Encounter (HOSPITAL_COMMUNITY): Payer: Self-pay | Admitting: Nurse Practitioner

## 2017-07-26 ENCOUNTER — Other Ambulatory Visit: Payer: Self-pay

## 2017-07-26 DIAGNOSIS — Z79899 Other long term (current) drug therapy: Secondary | ICD-10-CM | POA: Insufficient documentation

## 2017-07-26 DIAGNOSIS — Z008 Encounter for other general examination: Secondary | ICD-10-CM

## 2017-07-26 DIAGNOSIS — R45851 Suicidal ideations: Secondary | ICD-10-CM | POA: Insufficient documentation

## 2017-07-26 DIAGNOSIS — Z794 Long term (current) use of insulin: Secondary | ICD-10-CM | POA: Insufficient documentation

## 2017-07-26 DIAGNOSIS — I10 Essential (primary) hypertension: Secondary | ICD-10-CM | POA: Insufficient documentation

## 2017-07-26 DIAGNOSIS — F4324 Adjustment disorder with disturbance of conduct: Secondary | ICD-10-CM | POA: Insufficient documentation

## 2017-07-26 DIAGNOSIS — E119 Type 2 diabetes mellitus without complications: Secondary | ICD-10-CM | POA: Insufficient documentation

## 2017-07-26 DIAGNOSIS — F1994 Other psychoactive substance use, unspecified with psychoactive substance-induced mood disorder: Secondary | ICD-10-CM | POA: Diagnosis present

## 2017-07-26 LAB — RAPID URINE DRUG SCREEN, HOSP PERFORMED
Amphetamines: NOT DETECTED
Barbiturates: POSITIVE — AB
Benzodiazepines: POSITIVE — AB
Cocaine: NOT DETECTED
Opiates: NOT DETECTED
Tetrahydrocannabinol: NOT DETECTED

## 2017-07-26 LAB — CBC WITH DIFFERENTIAL/PLATELET
Band Neutrophils: 0 %
Basophils Absolute: 0 10*3/uL (ref 0.0–0.1)
Basophils Relative: 0 %
Blasts: 0 %
Eosinophils Absolute: 0.1 10*3/uL (ref 0.0–0.7)
Eosinophils Relative: 1 %
HCT: 36.8 % (ref 36.0–46.0)
Hemoglobin: 12.1 g/dL (ref 12.0–15.0)
Lymphocytes Relative: 41 %
Lymphs Abs: 2.8 10*3/uL (ref 0.7–4.0)
MCH: 26.2 pg (ref 26.0–34.0)
MCHC: 32.9 g/dL (ref 30.0–36.0)
MCV: 79.8 fL (ref 78.0–100.0)
Metamyelocytes Relative: 0 %
Monocytes Absolute: 0.4 10*3/uL (ref 0.1–1.0)
Monocytes Relative: 6 %
Myelocytes: 0 %
Neutro Abs: 3.6 10*3/uL (ref 1.7–7.7)
Neutrophils Relative %: 52 %
Other: 0 %
Platelets: 310 10*3/uL (ref 150–400)
Promyelocytes Absolute: 0 %
RBC: 4.61 MIL/uL (ref 3.87–5.11)
RDW: 15.7 % — ABNORMAL HIGH (ref 11.5–15.5)
WBC: 6.9 10*3/uL (ref 4.0–10.5)
nRBC: 0 /100 WBC

## 2017-07-26 LAB — COMPREHENSIVE METABOLIC PANEL
ALT: 37 U/L (ref 14–54)
AST: 27 U/L (ref 15–41)
Albumin: 3.8 g/dL (ref 3.5–5.0)
Alkaline Phosphatase: 116 U/L (ref 38–126)
Anion gap: 11 (ref 5–15)
BUN: 17 mg/dL (ref 6–20)
CO2: 23 mmol/L (ref 22–32)
Calcium: 9.1 mg/dL (ref 8.9–10.3)
Chloride: 98 mmol/L — ABNORMAL LOW (ref 101–111)
Creatinine, Ser: 0.75 mg/dL (ref 0.44–1.00)
GFR calc Af Amer: >60 mL/min (ref >60)
GFR calc non Af Amer: >60 mL/min (ref >60)
Glucose, Bld: 398 mg/dL — ABNORMAL HIGH (ref 65–99)
Potassium: 4.3 mmol/L (ref 3.5–5.1)
Sodium: 132 mmol/L — ABNORMAL LOW (ref 135–145)
Total Bilirubin: 0.3 mg/dL (ref 0.3–1.2)
Total Protein: 7.5 g/dL (ref 6.5–8.1)

## 2017-07-26 LAB — URINALYSIS, ROUTINE W REFLEX MICROSCOPIC
Bilirubin Urine: NEGATIVE
Glucose, UA: >=500 mg/dL — AB
Hgb urine dipstick: NEGATIVE
Ketones, ur: 5 mg/dL — AB
Leukocytes, UA: NEGATIVE
Nitrite: NEGATIVE
Protein, ur: 30 mg/dL — AB
RBC / HPF: NONE SEEN RBC/hpf (ref 0–5)
Specific Gravity, Urine: 1.029 (ref 1.005–1.030)
pH: 5 (ref 5.0–8.0)

## 2017-07-26 LAB — CBG MONITORING, ED
Glucose-Capillary: 384 mg/dL — ABNORMAL HIGH (ref 65–99)
Glucose-Capillary: 548 mg/dL (ref 65–99)
Glucose-Capillary: 463 mg/dL — ABNORMAL HIGH (ref 65–99)
Glucose-Capillary: 448 mg/dL — ABNORMAL HIGH (ref 65–99)
Glucose-Capillary: 283 mg/dL — ABNORMAL HIGH (ref 65–99)

## 2017-07-26 LAB — ETHANOL: Alcohol, Ethyl (B): <10 mg/dL (ref <10)

## 2017-07-26 LAB — ACETAMINOPHEN LEVEL: Acetaminophen (Tylenol), Serum: <10 ug/mL — ABNORMAL LOW (ref 10–30)

## 2017-07-26 LAB — SALICYLATE LEVEL: Salicylate Lvl: <7 mg/dL (ref 2.8–30.0)

## 2017-07-26 LAB — PREGNANCY, URINE: Preg Test, Ur: NEGATIVE

## 2017-07-26 MED ORDER — INSULIN GLARGINE 100 UNIT/ML ~~LOC~~ SOLN
60.0000 [IU] | Freq: Two times a day (BID) | SUBCUTANEOUS | Status: DC
  Administered 2017-07-26 – 2017-07-27 (×2): 60 [IU] via SUBCUTANEOUS
  Filled 2017-07-26 (×2): qty 0.6

## 2017-07-26 MED ORDER — LISINOPRIL 10 MG PO TABS
10.0000 mg | ORAL_TABLET | Freq: Every day | ORAL | Status: DC
  Administered 2017-07-26 – 2017-07-27 (×2): 10 mg via ORAL
  Filled 2017-07-26 (×2): qty 1

## 2017-07-26 MED ORDER — PANTOPRAZOLE SODIUM 40 MG PO TBEC
40.0000 mg | DELAYED_RELEASE_TABLET | Freq: Two times a day (BID) | ORAL | Status: DC
  Administered 2017-07-26 – 2017-07-27 (×2): 40 mg via ORAL
  Filled 2017-07-26 (×2): qty 1

## 2017-07-26 MED ORDER — TRAMADOL HCL 50 MG PO TABS: 50.0000 mg | ORAL_TABLET | ORAL | Status: DC | PRN

## 2017-07-26 MED ORDER — METOCLOPRAMIDE HCL 10 MG PO TABS
10.0000 mg | ORAL_TABLET | Freq: Three times a day (TID) | ORAL | Status: DC
  Administered 2017-07-26 – 2017-07-27 (×3): 10 mg via ORAL
  Filled 2017-07-26 (×3): qty 1

## 2017-07-26 MED ORDER — BUSPIRONE HCL 10 MG PO TABS: 15.0000 mg | ORAL_TABLET | Freq: Three times a day (TID) | ORAL | Status: DC

## 2017-07-26 MED ORDER — DIVALPROEX SODIUM 500 MG PO DR TAB: 750.0000 mg | DELAYED_RELEASE_TABLET | Freq: Two times a day (BID) | ORAL | Status: DC

## 2017-07-26 MED ORDER — HALOPERIDOL 1 MG PO TABS
0.5000 mg | ORAL_TABLET | Freq: Every day | ORAL | Status: DC
  Administered 2017-07-26: 0.5 mg via ORAL
  Filled 2017-07-26: qty 1

## 2017-07-26 MED ORDER — INSULIN ASPART 100 UNIT/ML ~~LOC~~ SOLN
15.0000 [IU] | Freq: Once | SUBCUTANEOUS | Status: AC
  Administered 2017-07-26: 15 [IU] via SUBCUTANEOUS
  Filled 2017-07-26: qty 1

## 2017-07-26 MED ORDER — IBUPROFEN 800 MG PO TABS: 800.0000 mg | ORAL_TABLET | Freq: Three times a day (TID) | ORAL | Status: DC | PRN

## 2017-07-26 MED ORDER — SIMVASTATIN 20 MG PO TABS
20.0000 mg | ORAL_TABLET | Freq: Every day | ORAL | Status: DC
  Administered 2017-07-26: 20 mg via ORAL
  Filled 2017-07-26: qty 1

## 2017-07-26 MED ORDER — MIRTAZAPINE 30 MG PO TABS
45.0000 mg | ORAL_TABLET | Freq: Every day | ORAL | Status: DC
  Administered 2017-07-26: 45 mg via ORAL
  Filled 2017-07-26: qty 2

## 2017-07-26 MED ORDER — METHOCARBAMOL 500 MG PO TABS: 500.0000 mg | ORAL_TABLET | Freq: Four times a day (QID) | ORAL | Status: DC | PRN

## 2017-07-26 MED ORDER — INSULIN ASPART 100 UNIT/ML ~~LOC~~ SOLN
8.0000 [IU] | Freq: Three times a day (TID) | SUBCUTANEOUS | Status: DC
  Administered 2017-07-26 – 2017-07-27 (×2): 8 [IU] via SUBCUTANEOUS
  Filled 2017-07-26 (×2): qty 1

## 2017-07-26 MED ORDER — HYDROXYZINE HCL 25 MG PO TABS: 25.0000 mg | ORAL_TABLET | Freq: Four times a day (QID) | ORAL | Status: DC | PRN

## 2017-07-26 MED ORDER — PROMETHAZINE HCL 25 MG PO TABS: 12.5000 mg | ORAL_TABLET | Freq: Four times a day (QID) | ORAL | Status: DC | PRN

## 2017-07-26 MED ORDER — SODIUM CHLORIDE 0.9 % IV SOLN: Freq: Once | INTRAVENOUS | Status: DC

## 2017-07-26 MED ORDER — ATORVASTATIN CALCIUM 40 MG PO TABS: 40.0000 mg | ORAL_TABLET | Freq: Every day | ORAL | Status: DC

## 2017-07-26 MED ORDER — GABAPENTIN 300 MG PO CAPS
300.0000 mg | ORAL_CAPSULE | Freq: Three times a day (TID) | ORAL | Status: DC
  Administered 2017-07-26 – 2017-07-27 (×3): 300 mg via ORAL
  Filled 2017-07-26 (×3): qty 1

## 2017-07-26 MED ORDER — GABAPENTIN 400 MG PO CAPS: 800.0000 mg | ORAL_CAPSULE | Freq: Three times a day (TID) | ORAL | Status: DC

## 2017-07-26 MED ORDER — MIRTAZAPINE 30 MG PO TABS: 30.0000 mg | ORAL_TABLET | Freq: Every day | ORAL | Status: DC

## 2017-07-26 NOTE — ED Notes (Signed)
Pt refusing IV stating "I'm not here for my BS."  Informed pt is also d/t low b/p.  Pt again refused IV.

## 2017-07-26 NOTE — ED Triage Notes (Signed)
Patients mother died 9 days ago and she is having a hard time coping and has been having SI thoughts about running in front of a car. Patient does have a history of depression.

## 2017-07-26 NOTE — ED Notes (Addendum)
Dr. Estell HarpinZammit informed of pt's refusal of IV.  Verbal order:  Offer pt p.o. fluids.

## 2017-07-26 NOTE — ED Notes (Signed)
Pt stating "I drink 2 fifths a day.  Can I get something like atarax?"

## 2017-07-26 NOTE — ED Provider Notes (Signed)
[facility]-EMERGENCY DEPT Provider Note   CSN: [id number] Arrival date & time: 07/26/17  1114     History   Chief Complaint No chief complaint on file.   HPI Carolyn Ayala is a 42 y.o. female history of depression, chronic PTSD, insulin-dependent diabetes & hypertension who presents to the emergency department today for suicidal thoughts.  Patient states that her mother passed away approximately 9 days ago after a long bout of being very ill.  During that time she was having suicidal thoughts due to the thought of living life with out her mother.  Since the passing away of her mother things have been very hard as she used to live with her and there is a constant reminder she is no longer there.  The patient states that every day she has thoughts of running out in front of a car or overdosing on all of her medicine (in particular Lisinopril and Remeron).  She states that today she heard "voices" telling her to harm herself.  Patient notes that the only reason she has not acted on this is because she constantly thinks to herself "what would my mother think".  The patient denies suicidal attempt.  She states that she has not taken her home medicines today. Does admit to drinking alcohol "heavily" yesterday but denies any illicit drug use.  No alcohol use today.  She denies any homicidal ideation. The patient denies any physical complaints at this time including but not limited to fever, chills, body aches, arthralgias, myalgias, headache, chest pain, shortness of breath, abdominal pain, nausea/vomiting/diarrhea or urinary symptoms.  HPI  Past Medical History:  Diagnosis Date  . Depression   . Diabetes mellitus without complication (HCC)   . Hypertension   . Mental disorder   . Noncompliance     Patient Active Problem List   Diagnosis Date Noted  . History of command hallucinations   . Suicidal thoughts   . Abdominal pain   . Diabetic ketoacidosis without  coma associated with type 2 diabetes mellitus (HCC)   . Gastroparesis   . Hypokalemia   . DKA (diabetic ketoacidoses) (HCC) 09/24/2015  . HTN (hypertension) 09/24/2015  . Diabetes mellitus without complication (HCC)   . Hyperlipidemia 08/10/2015  . Alcohol use disorder, moderate, dependence (HCC) 08/10/2015  . Neuropathic pain 08/08/2015  . Seasonal allergic rhinitis 08/08/2015  . 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 Uncontrolled diabetes mellitus with neurologic complication, with long-term current use of insulin (HCC)   . Uncontrolled diabetes mellitus with diabetic neuropathy, with long-term current use of insulin (HCC) 04/21/2015  . Generalized anxiety disorder 02/13/2013  . Chronic posttraumatic stress disorder 02/13/2013  . Substance induced mood disorder (HCC) 02/08/2013  . IDDM (insulin dependent diabetes mellitus) (HCC) 02/08/2013    Past Surgical History:  Procedure Laterality Date  . CESAREAN SECTION      OB History    No data available       Home Medications    Prior to Admission medications   Medication Sig Start Date End Date Taking? Authorizing Provider  gabapentin (NEURONTIN) 300 MG capsule Take 1 capsule (300 mg total) by mouth 3 (three) times daily. Patient taking differently: Take 900 mg by mouth 3 (three) times daily.  10/02/15   Vassie Loll, MD  haloperidol (HALDOL) 0.5 MG tablet Take 1 tablet (0.5 mg total) by mouth daily at 12 noon. 10/13/15   Earney Navy, NP  hydrOXYzine (ATARAX/VISTARIL) 50 MG tablet Take 0.5  tablets (25 mg total) by mouth every 6 (six) hours as needed for anxiety. 10/13/15   Earney Navy, NP  insulin aspart (NOVOLOG) 100 UNIT/ML injection Inject 8 Units into the skin 3 (three) times daily with meals. 10/13/15   Jeralyn Bennett, MD  insulin glargine (LANTUS) 100 UNIT/ML injection Inject 0.6 mLs (60 Units total) into the skin 2 (two) times daily. 10/13/15   Jeralyn Bennett, MD  lisinopril  (PRINIVIL,ZESTRIL) 10 MG tablet Take 1 tablet (10 mg total) by mouth daily. 10/13/15   Earney Navy, NP  metoCLOPramide (REGLAN) 10 MG tablet Take 1 tablet (10 mg total) by mouth 4 (four) times daily -  before meals and at bedtime. 10/13/15   Earney Navy, NP  mirtazapine (REMERON) 45 MG tablet Take 1 tablet (45 mg total) by mouth at bedtime. 10/13/15   Earney Navy, NP  Multiple Vitamin (MULTIVITAMIN WITH MINERALS) TABS tablet Take 1 tablet by mouth daily.    [provider]  pantoprazole (PROTONIX) 40 MG tablet Take 1 tablet (40 mg total) by mouth 2 (two) times daily with a meal. 10/13/15   Earney Navy, NP  promethazine (PHENERGAN) 12.5 MG tablet Take 1 tablet (12.5 mg total) by mouth every 6 (six) hours as needed for nausea or vomiting. 10/13/15   Earney Navy, NP  simvastatin (ZOCOR) 20 MG tablet Take 1 tablet (20 mg total) by mouth daily at 6 PM. 10/13/15   Earney Navy, NP  Thiamine Mononitrate (VITAMIN B1 PO) Take 1 tablet by mouth daily.    [provider]  VITAMIN E PO Take 2 tablets by mouth daily.    [provider]    Family History Family History  Problem Relation Age of Onset  . Depression Mother     Social History Social History   Tobacco Use  . Smoking status: Never Smoker  . Smokeless tobacco: Current User  Substance Use Topics  . Alcohol use: Yes    Comment: daily  . Drug use: No     Allergies   Patient has no known allergies.   Review of Systems Review of Systems  All other systems reviewed and are negative.    Physical Exam Updated Vital Signs BP 133/89 (BP Location: Right Arm)   Pulse 88   Temp 97.6 F (36.4 C) (Oral)   Resp 16   Ht 5\' 2"  (1.575 m)   Wt 36.3 kg (80 lb)   SpO2 98%   BMI 14.63 kg/m   Physical Exam  Constitutional: She appears well-developed and well-nourished.  HENT:  Head: Normocephalic and atraumatic.  Right Ear: External ear normal.  Left Ear: External ear normal.   Nose: Nose normal.  Mouth/Throat: Uvula is midline, oropharynx is clear and moist and mucous membranes are normal. No tonsillar exudate.  Eyes: Pupils are equal, round, and reactive to light. Right eye exhibits no discharge. Left eye exhibits no discharge. No scleral icterus.  Neck: Trachea normal. Neck supple. No spinous process tenderness present. No neck rigidity. Normal range of motion present.  Cardiovascular: Normal rate, regular rhythm and intact distal pulses.  No murmur heard. Pulses:      Radial pulses are 2+ on the right side, and 2+ on the left side.       Dorsalis pedis pulses are 2+ on the right side, and 2+ on the left side.       Posterior tibial pulses are 2+ on the right side, and 2+ on the left side.  No lower extremity swelling or edema. Calves symmetric in size bilaterally.  Pulmonary/Chest: Effort normal and breath sounds normal. She exhibits no tenderness.  Abdominal: Soft. Bowel sounds are normal. She exhibits no distension and no mass. There is no tenderness. There is no rigidity, no rebound, no guarding and no CVA tenderness.  Musculoskeletal: She exhibits no edema.  Lymphadenopathy:    She has no cervical adenopathy.  Neurological: She is alert.  Skin: Skin is warm and dry. No rash noted. She is not diaphoretic.  Psychiatric: She has a normal mood and affect.  Nursing note and vitals reviewed.    ED Treatments / Results  Labs (all labs ordered are listed, but only abnormal results are displayed) Labs Reviewed  RAPID URINE DRUG SCREEN, HOSP PERFORMED - Abnormal; Notable for the following components:      Result Value   Benzodiazepines POSITIVE (*)    Barbiturates POSITIVE (*)    All other components within normal limits  URINALYSIS, ROUTINE W REFLEX MICROSCOPIC  COMPREHENSIVE METABOLIC PANEL  ETHANOL  SALICYLATE LEVEL  ACETAMINOPHEN LEVEL  CBC WITH DIFFERENTIAL/PLATELET  I-STAT BETA HCG BLOOD, ED ([facility], [facility], [facility] ONLY)    EKG  EKG  Interpretation None       Radiology No results found.  Procedures Procedures (including critical care time)  Medications Ordered in ED Medications  gabapentin (NEURONTIN) capsule 300 mg (300 mg Oral Given 07/26/17 1408)  haloperidol (HALDOL) tablet 0.5 mg (0.5 mg Oral Given 07/26/17 1407)  hydrOXYzine (ATARAX/VISTARIL) tablet 25 mg (not administered)  insulin aspart (novoLOG) injection 8 Units (8 Units Subcutaneous Given 07/26/17 1646)  insulin glargine (LANTUS) injection 60 Units (60 Units Subcutaneous Given 07/26/17 1644)  lisinopril (PRINIVIL,ZESTRIL) tablet 10 mg (10 mg Oral Given 07/26/17 1407)  metoCLOPramide (REGLAN) tablet 10 mg (10 mg Oral Given 07/26/17 1646)  mirtazapine (REMERON) tablet 45 mg (not administered)  pantoprazole (PROTONIX) EC tablet 40 mg (40 mg Oral Given 07/26/17 1646)  promethazine (PHENERGAN) tablet 12.5 mg (not administered)  simvastatin (ZOCOR) tablet 20 mg (not administered)     Initial Impression / Assessment and Plan / ED Course  I have reviewed the triage vital signs and the nursing notes.  Pertinent labs & imaging results that were available during my care of the patient were reviewed by me and considered in my medical decision making (see chart for details).     42 y.o. female presenting to the ED today with SI. Patient does have a plan. She denies any HI. She is her voluntarily, seeking placement/behavioral health assistance. The patient denies any psychical complaints at this time. Lab work pending. She has not taken home medication today. Home meds reordered. Vitals signs are reassuring. TTS consult placed.   Labs are reassuring. Elevated glucose but patient without evidence of DKA. Patient has been without insulin this morning. This was reordered and given. Patient evaluated by TTS and recommended observation overnight. Patient is medically cleared.  Final Clinical Impressions(s) / ED Diagnoses   Final diagnoses:  Encounter for medical  clearance for patient hold    ED Discharge Orders    None       Princella PellegriniMaczis, Michael M, PA-C 07/26/17 1717    Donnetta Hutchingook, Brian, MD 07/27/17 1816

## 2017-07-26 NOTE — ED Notes (Signed)
Dr. Estell HarpinZammit informed of pt's continued elevation of BS & low b/p.
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
## 2017-07-27 DIAGNOSIS — F10239 Alcohol dependence with withdrawal, unspecified: Secondary | ICD-10-CM

## 2017-07-27 DIAGNOSIS — F419 Anxiety disorder, unspecified: Secondary | ICD-10-CM

## 2017-07-27 DIAGNOSIS — Z818 Family history of other mental and behavioral disorders: Secondary | ICD-10-CM

## 2017-07-27 DIAGNOSIS — F329 Major depressive disorder, single episode, unspecified: Secondary | ICD-10-CM

## 2017-07-27 DIAGNOSIS — F1310 Sedative, hypnotic or anxiolytic abuse, uncomplicated: Secondary | ICD-10-CM

## 2017-07-27 LAB — CBG MONITORING, ED: Glucose-Capillary: 261 mg/dL — ABNORMAL HIGH (ref 65–99)

## 2017-07-27 NOTE — BHH Suicide Risk Assessment (Signed)
Suicide Risk Assessment  Discharge Assessment   [facility] Discharge Suicide Risk Assessment   Principal Problem: Substance induced mood disorder [facility]) Discharge Diagnoses:  Patient Active Problem List   Diagnosis Date Noted  . Substance induced mood disorder (HCC) [F19.94] 02/08/2013    Priority: High  . History of command hallucinations [Z86.59]   . Suicidal thoughts [R45.851]   . Abdominal pain [R10.9]   . Diabetic ketoacidosis without coma associated with type 2 diabetes mellitus (HCC) [E11.10]   . Gastroparesis [K31.84]   . Hypokalemia [E87.6]   . DKA (diabetic ketoacidoses) (HCC) [E13.10] 09/24/2015  . HTN (hypertension) [I10] 09/24/2015  . Diabetes mellitus without complication (HCC) [E11.9]   . Hyperlipidemia [E78.5] 08/10/2015  . Alcohol use disorder, moderate, dependence (HCC) [F10.20] 08/10/2015  . Neuropathic pain [M79.2] 08/08/2015  . Seasonal allergic rhinitis [J30.2] 08/08/2015  . 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 Uncontrolled diabetes mellitus with neurologic complication, with long-term current use of insulin (HCC) [E11.49, Z79.4, E11.65]   . Uncontrolled diabetes mellitus with diabetic neuropathy, with long-term current use of insulin (HCC) [E11.40, Z79.4, E11.65] 04/21/2015  . Generalized anxiety disorder [F41.1] 02/13/2013  . Chronic posttraumatic stress disorder [F43.12] 02/13/2013  . IDDM (insulin dependent diabetes mellitus) (HCC) [E11.9, Z79.4] 02/08/2013    Total Time spent with patient: 45 minutes    Musculoskeletal: Strength & Muscle Tone: within normal limits Gait & Station: normal Patient leans: N/A  Psychiatric Specialty Exam: Physical Exam  Constitutional: She is oriented to person, place, and time. She appears well-developed and well-nourished.  HENT:  Head: Normocephalic.  Neck: Normal range of motion.  Respiratory: Effort normal.  Musculoskeletal: Normal range of motion.  Neurological: She is  alert and oriented to person, place, and time.  Psychiatric: She has a normal mood and affect. Her speech is normal and behavior is normal. Judgment and thought content normal. Cognition and memory are normal.    Review of Systems  Psychiatric/Behavioral: Positive for substance abuse.  All other systems reviewed and are negative.   Blood pressure 123/80, pulse 100, temperature 98.2 F (36.8 C), temperature source Oral, resp. rate 18, height 5\' 2"  (1.575 m), weight 36.3 kg (80 lb), SpO2 100 %.Body mass index is 14.63 kg/m.  General Appearance: Casual  Eye Contact:  Good  Speech:  Normal Rate  Volume:  Normal  Mood:  Euthymic  Affect:  Congruent  Thought Process:  Coherent and Descriptions of Associations: Intact  Orientation:  Full (Time, Place, and Person)  Thought Content:  WDL and Logical  Suicidal Thoughts:  No  Homicidal Thoughts:  No  Memory:  Immediate;   Good Recent;   Good Remote;   Good  Judgement:  Fair  Insight:  Fair  Psychomotor Activity:  Normal  Concentration:  Concentration: Good and Attention Span: Good  Recall:  [employer] of Knowledge:  Fair  Language:  Good  Akathisia:  No  Handed:  Right  AIMS (if indicated):     Assets:  Leisure Time Physical Health Resilience  ADL's:  Intact  Cognition:  WNL  Sleep:       Mental Status Per Nursing Assessment::   On Admission:   alcohol abuse with depression  Demographic Factors:  NA  Loss Factors: NA  Historical Factors: NA  Risk Reduction Factors:   Sense of responsibility to family  Continued Clinical Symptoms:  None   Cognitive Features That Contribute To Risk:  None    Suicide Risk:  Minimal: No identifiable  suicidal ideation.  Patients presenting with no risk factors but with morbid ruminations; may be classified as minimal risk based on the severity of the depressive symptoms    Plan Of Care/Follow-up recommendations:  Activity:  as tolerated Diet:  heart healthy diet  Jonny Dearden,  NP 07/27/2017, 3:41 PM

## 2017-07-27 NOTE — BH Assessment (Signed)
[facility]  Per Juanetta BeetsJacqueline Norman, DO, this pt does not require psychiatric hospitalization at this time.  [facility] with [facility]  This has been included in pt's discharge instructions.  Pt would also benefit from seeing Peer Support Specialists; they will be asked to speak to pt.  [name] nurse has been notified.  Doylene Canninghomas Zion Lint, MA Triage Specialist [phone]

## 2017-07-27 NOTE — Patient Outreach (Signed)
[facility] met with the patient and provided substance use recovery support. Patient is currently seeking help with inpatient substance use treatment for alcohol. Patient currently does not have any alcohol in her system. [facility] provided substance use inpatient resources along with an AA meeting list. [facility] currently does not have residential bed avaliable for a female. Patient plans to follow up with [facility]. [facility] also provided [facility] contact information. [facility] encouraged the patient to contact [facility] at anytime for substance use recovery support.

## 2017-07-27 NOTE — Consult Note (Signed)
Carolyn Ayala   Reason for Ayala:  Alcohol abuse with depression Referring Physician:  EDP Patient Identification: Carolyn Ayala MRN:  [id number] Principal Diagnosis: Substance induced mood disorder ([place]) Diagnosis:   Patient Active Problem List   Diagnosis Date Noted  . Substance induced mood disorder ([place]) [F19.94] 02/08/2013    Priority: High  . History of command hallucinations [Z86.59]   . Suicidal thoughts [R45.851]   . Abdominal pain [R10.9]   . Diabetic ketoacidosis without coma associated with type 2 diabetes mellitus ([place]) [E11.10]   . Gastroparesis [K31.84]   . Hypokalemia [E87.6]   . DKA (diabetic ketoacidoses) ([place]) [E13.10] 09/24/2015  . HTN (hypertension) [I10] 09/24/2015  . Diabetes mellitus without complication ([place]) [W09.8]   . Hyperlipidemia [E78.5] 08/10/2015  . Alcohol use disorder, moderate, dependence ([place]) [F10.20] 08/10/2015  . Neuropathic pain [M79.2] 08/08/2015  . Seasonal allergic rhinitis [J30.2] 08/08/2015  . 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 Uncontrolled diabetes mellitus with neurologic complication, with long-term current use of insulin (HCC) [E11.49, Z79.4, E11.65]   . Uncontrolled diabetes mellitus with diabetic neuropathy, with long-term current use of insulin ([place]) [E11.40, Z79.4, E11.65] 04/21/2015  . Generalized anxiety disorder [F41.1] 02/13/2013  . Chronic posttraumatic stress disorder [F43.12] 02/13/2013  . IDDM (insulin dependent diabetes mellitus) ([place]) [E11.9, Z79.4] 02/08/2013    Total Time spent with patient: 45 minutes  Subjective:   Carolyn Ayala is a 42 y.o. female patient is stable for discharge.  HPI:  42 yo female who presented reporting drinking alcohol with depression.  She is under an alias, real name is Carolyn Ayala, total of five in the past year.  Negative for alcohol despite drinking 2-fifths prior to admission per her report.   No drug abuse, no suicidal/homcidal ideations or hallucinations.  Peer support Ayala with resources provided.  Past Psychiatric History: depression, substance abuse  Risk to Self: None Risk to Others: Homicidal Ideation: No Thoughts of Harm to Others: No Current Homicidal Intent: No Current Homicidal Plan: No Access to Homicidal Means: No History of harm to others?: No Assessment of Violence: None Noted Does patient have access to weapons?: No Criminal Charges Pending?: No Does patient have a court date: No Prior Inpatient Therapy: Prior Inpatient Therapy: Yes Prior Therapy Dates: Feb 2017 Prior Therapy Facilty/Provider(s): [facility] Reason for Treatment: Schizophrenia Prior Outpatient Therapy: Prior Outpatient Therapy: No Does patient have an ACCT team?: No Does patient have Intensive In-House Services?  : No Does patient have [facility] services? : No Does patient have P4CC services?: No  Past Medical History:  Past Medical History:  Diagnosis Date  . Depression   . Diabetes mellitus without complication ([place])   . Hypertension   . Mental disorder   . Noncompliance     Past Surgical History:  Procedure Laterality Date  . CESAREAN SECTION     Family History:  Family History  Problem Relation Age of Onset  . Depression Mother    Family Psychiatric  History: Mother-depression Social History:  Social History   Substance and Sexual Activity  Alcohol Use Yes   Comment:  Daily use -- a 1/5 daily     Social History   Substance and Sexual Activity  Drug Use Yes  . Frequency: 7.0 times per week  . Types: Barbituates, Benzodiazepines   Comment: Pt denied to Chief Strategy Officer    Social History   Socioeconomic History  . Marital status: Single    Spouse name: None  . Number  of children: None  . Years of education: None  . Highest education level: None  Social Needs  . Financial resource strain: None  . Food insecurity - worry: None  . Food insecurity - inability: None  .  Transportation needs - medical: None  . Transportation needs - non-medical: None  Occupational History  . None  Tobacco Use  . Smoking status: Never Smoker  . Smokeless tobacco: Current User  Substance and Sexual Activity  . Alcohol use: Yes    Comment:  Daily use -- a 1/5 daily  . Drug use: Yes    Frequency: 7.0 times per week    Types: Barbituates, Benzodiazepines    Comment: Pt denied to author  . Sexual activity: No  Other Topics Concern  . None  Social History Narrative   ** Merged History Encounter **       Additional Social History: N/A    Allergies:  No Known Allergies  Labs:  Results for orders placed or performed during the hospital encounter of 07/26/17 (from the past 48 hour(s))  Rapid urine drug screen (hospital performed)     Status: Abnormal   Collection Time: 07/26/17 12:03 PM  Result Value Ref Range   Opiates NONE DETECTED NONE DETECTED   Cocaine NONE DETECTED NONE DETECTED   Benzodiazepines POSITIVE (A) NONE DETECTED   Amphetamines NONE DETECTED NONE DETECTED   Tetrahydrocannabinol NONE DETECTED NONE DETECTED   Barbiturates POSITIVE (A) NONE DETECTED    Comment: (NOTE) DRUG SCREEN FOR MEDICAL PURPOSES ONLY.  IF CONFIRMATION IS NEEDED FOR ANY PURPOSE, NOTIFY LAB WITHIN 5 DAYS. LOWEST DETECTABLE LIMITS FOR URINE DRUG SCREEN Drug Class                     Cutoff (ng/mL) Amphetamine and metabolites    1000 Barbiturate and metabolites    200 Benzodiazepine                 631 Tricyclics and metabolites     300 Opiates and metabolites        300 Cocaine and metabolites        300 THC                            50 Performed at [facility], [place] [address] [place], [place] [address]   Urinalysis, Routine w reflex microscopic     Status: Abnormal   Collection Time: 07/26/17 12:03 PM  Result Value Ref Range   Color, Urine YELLOW YELLOW   APPearance CLEAR CLEAR   Specific Gravity, Urine 1.029 1.005 - 1.030   pH 5.0 5.0 - 8.0    Glucose, UA >=500 (A) NEGATIVE mg/dL   Hgb urine dipstick NEGATIVE NEGATIVE   Bilirubin Urine NEGATIVE NEGATIVE   Ketones, ur 5 (A) NEGATIVE mg/dL   Protein, ur 30 (A) NEGATIVE mg/dL   Nitrite NEGATIVE NEGATIVE   Leukocytes, UA NEGATIVE NEGATIVE   RBC / HPF NONE SEEN 0 - 5 RBC/hpf   WBC, UA 0-5 0 - 5 WBC/hpf   Bacteria, UA RARE (A) NONE SEEN   Squamous Epithelial / LPF 0-5 (A) NONE SEEN   Mucus PRESENT     Comment: Performed at [facility], [place] [address] [place], [place] [address]  Pregnancy, urine     Status: None   Collection Time: 07/26/17 12:03 PM  Result Value Ref Range   Preg Test, Ur NEGATIVE NEGATIVE  Comment:        THE SENSITIVITY OF THIS METHODOLOGY IS >20 mIU/mL. Performed at [facility], [place] [address] [place], [place] [address]   CBG monitoring, ED     Status: Abnormal   Collection Time: 07/26/17  1:55 PM  Result Value Ref Range   Glucose-Capillary 384 (H) 65 - 99 mg/dL   Comment 1 Notify RN   Comprehensive metabolic panel     Status: Abnormal   Collection Time: 07/26/17  1:58 PM  Result Value Ref Range   Sodium 132 (L) 135 - 145 mmol/L   Potassium 4.3 3.5 - 5.1 mmol/L   Chloride 98 (L) 101 - 111 mmol/L   CO2 23 22 - 32 mmol/L   Glucose, Bld 398 (H) 65 - 99 mg/dL   BUN 17 6 - 20 mg/dL   Creatinine, Ser 0.75 0.44 - 1.00 mg/dL   Calcium 9.1 8.9 - 10.3 mg/dL   Total Protein 7.5 6.5 - 8.1 g/dL   Albumin 3.8 3.5 - 5.0 g/dL   AST 27 15 - 41 U/L   ALT 37 14 - 54 U/L   Alkaline Phosphatase 116 38 - 126 U/L   Total Bilirubin 0.3 0.3 - 1.2 mg/dL   GFR calc non Af Amer >60 >60 mL/min   GFR calc Af Amer >60 >60 mL/min    Comment: (NOTE) The eGFR has been calculated using the CKD EPI equation. This calculation has not been validated in all clinical situations. eGFR's persistently <60 mL/min signify possible Chronic Kidney Disease.    Anion gap 11 5 - 15    Comment: Performed at [facility], [place]  [address] [place], [place] [address]  Ethanol     Status: None   Collection Time: 07/26/17  1:58 PM  Result Value Ref Range   Alcohol, Ethyl (B) <10 <10 mg/dL    Comment:        LOWEST DETECTABLE LIMIT FOR SERUM ALCOHOL IS 10 mg/dL FOR MEDICAL PURPOSES ONLY Performed at [place] [address] [place], [place] [address]   Salicylate level     Status: None   Collection Time: 07/26/17  1:58 PM  Result Value Ref Range   Salicylate Lvl <0.9 2.8 - 30.0 mg/dL    Comment: Performed at [facility], [place] [address], [place], [place] [address]  Acetaminophen level     Status: Abnormal   Collection Time: 07/26/17  1:58 PM  Result Value Ref Range   Acetaminophen (Tylenol), Serum <10 (L) 10 - 30 ug/mL    Comment:        THERAPEUTIC CONCENTRATIONS VARY SIGNIFICANTLY. A RANGE OF 10-30 ug/mL MAY BE AN EFFECTIVE CONCENTRATION FOR MANY PATIENTS. HOWEVER, SOME ARE BEST TREATED AT CONCENTRATIONS OUTSIDE THIS RANGE. ACETAMINOPHEN CONCENTRATIONS >150 ug/mL AT 4 HOURS AFTER INGESTION AND >50 ug/mL AT 12 HOURS AFTER INGESTION ARE OFTEN ASSOCIATED WITH TOXIC REACTIONS. Performed at [facility], [place] [address], [place], [place] [address]   CBC with Differential     Status: Abnormal   Collection Time: 07/26/17  1:58 PM  Result Value Ref Range   WBC 6.9 4.0 - 10.5 K/uL    Comment: REPEATED TO VERIFY WHITE COUNT CONFIRMED ON SMEAR    RBC 4.61 3.87 - 5.11 MIL/uL   Hemoglobin 12.1 12.0 - 15.0 g/dL   HCT 36.8 36.0 - 46.0 %   MCV 79.8 78.0 - 100.0 fL   MCH 26.2 26.0 - 34.0 pg  MCHC 32.9 30.0 - 36.0 g/dL   RDW 15.7 (H) 11.5 - 15.5 %   Platelets 310 150 - 400 K/uL    Comment: SPECIMEN CHECKED FOR CLOTS REPEATED TO VERIFY PLATELET COUNT CONFIRMED BY SMEAR    Neutrophils Relative % 52 %   Lymphocytes Relative 41 %   Monocytes Relative 6 %   Eosinophils Relative 1 %   Basophils Relative 0 %   Band Neutrophils 0 %   Metamyelocytes  Relative 0 %   Myelocytes 0 %   Promyelocytes Absolute 0 %   Blasts 0 %   nRBC 0 0 /100 WBC   Other 0 %   Neutro Abs 3.6 1.7 - 7.7 K/uL   Lymphs Abs 2.8 0.7 - 4.0 K/uL   Monocytes Absolute 0.4 0.1 - 1.0 K/uL   Eosinophils Absolute 0.1 0.0 - 0.7 K/uL   Basophils Absolute 0.0 0.0 - 0.1 K/uL   Smear Review MORPHOLOGY UNREMARKABLE     Comment: Performed at [facility], [place] [address], [place], [place] [address]  CBG monitoring, ED     Status: Abnormal   Collection Time: 07/26/17  4:49 PM  Result Value Ref Range   Glucose-Capillary 548 (HH) 65 - 99 mg/dL  CBG monitoring, ED     Status: Abnormal   Collection Time: 07/26/17  7:45 PM  Result Value Ref Range   Glucose-Capillary 463 (H) 65 - 99 mg/dL   Comment 1 Notify RN    Comment 2 Document in Chart   CBG monitoring, ED     Status: Abnormal   Collection Time: 07/26/17  9:08 PM  Result Value Ref Range   Glucose-Capillary 448 (H) 65 - 99 mg/dL  POC CBG, ED     Status: Abnormal   Collection Time: 07/26/17 11:17 PM  Result Value Ref Range   Glucose-Capillary 283 (H) 65 - 99 mg/dL   Comment 1 Notify RN    Comment 2 Document in Chart   POC CBG, ED     Status: Abnormal   Collection Time: 07/27/17  7:33 AM  Result Value Ref Range   Glucose-Capillary 261 (H) 65 - 99 mg/dL    Current Facility-Administered Medications  Medication Dose Route Frequency Provider Last Rate Last Dose  . 0.9 %  sodium chloride infusion   Intravenous Once Milton Ferguson, MD      . gabapentin (NEURONTIN) capsule 300 mg  300 mg Oral TID Jillyn Ledger, PA-C   300 mg at 07/27/17 0846  . haloperidol (HALDOL) tablet 0.5 mg  0.5 mg Oral [id number] Jillyn Ledger, PA-C   0.5 mg at 07/26/17 1407  . hydrOXYzine (ATARAX/VISTARIL) tablet 25 mg  25 mg Oral Q6H PRN Jillyn Ledger, PA-C      . insulin aspart (novoLOG) injection 8 Units  8 Units Subcutaneous TID WC Jillyn Ledger, PA-C   8 Units at 07/27/17 0847  . insulin glargine (LANTUS) injection  60 Units  60 Units Subcutaneous BID Jillyn Ledger, [place]   60 Units at 07/27/17 0848  . lisinopril (PRINIVIL,ZESTRIL) tablet 10 mg  10 mg Oral Daily Jillyn Ledger, PA-C   10 mg at 07/27/17 0848  . metoCLOPramide (REGLAN) tablet 10 mg  10 mg Oral TID AC & HS Jillyn Ledger, PA-C   10 mg at 07/27/17 0847  . mirtazapine (REMERON) tablet 45 mg  45 mg Oral QHS Jillyn Ledger, PA-C   45 mg at 07/26/17 2323  . pantoprazole (PROTONIX) EC tablet  40 mg  40 mg Oral BID WC Jillyn Ledger, PA-C   40 mg at 07/27/17 0846  . promethazine (PHENERGAN) tablet 12.5 mg  12.5 mg Oral Q6H PRN Maczis, Barth Kirks, PA-C      . simvastatin (ZOCOR) tablet 20 mg  20 mg Oral q1800 Jillyn Ledger, PA-C   20 mg at 07/26/17 2012   Current Outpatient Medications  Medication Sig Dispense Refill  . atorvastatin (LIPITOR) 40 MG tablet Take 40 mg by mouth daily.    . busPIRone (BUSPAR) 15 MG tablet Take 15 mg by mouth 3 (three) times daily.    . divalproex (DEPAKOTE) 500 MG DR tablet Take 750 mg by mouth 2 (two) times daily.    Marland Kitchen gabapentin (NEURONTIN) 400 MG capsule Take 800 mg by mouth 3 (three) times daily.    . haloperidol (HALDOL) 0.5 MG tablet Take 1 tablet (0.5 mg total) by mouth daily at 12 noon. (Patient taking differently: Take 0.5 mg by mouth 2 (two) times daily. ) 30 tablet 0  . hydrOXYzine (ATARAX/VISTARIL) 50 MG tablet Take 0.5 tablets (25 mg total) by mouth every 6 (six) hours as needed for anxiety. (Patient taking differently: Take 50 mg by mouth every 6 (six) hours as needed for anxiety. ) 30 tablet 0  . ibuprofen (ADVIL,MOTRIN) 800 MG tablet Take 800 mg by mouth every 8 (eight) hours as needed for moderate pain.    Marland Kitchen insulin aspart (NOVOLOG) 100 UNIT/ML injection Inject 8 Units into the skin 3 (three) times daily with meals. (Patient taking differently: Inject 0-40 Units into the skin 3 (three) times daily with meals. Sliding scale) 10 mL 3  . insulin glargine (LANTUS) 100 UNIT/ML injection Inject  0.6 mLs (60 Units total) into the skin 2 (two) times daily. 10 mL 11  . lisinopril (PRINIVIL,ZESTRIL) 10 MG tablet Take 1 tablet (10 mg total) by mouth daily. 30 tablet 0  . methocarbamol (ROBAXIN) 500 MG tablet Take 500 mg by mouth every 6 (six) hours as needed for muscle spasms.    . mirtazapine (REMERON) 30 MG tablet Take 30 mg by mouth at bedtime.    . Multiple Vitamin (MULTIVITAMIN WITH MINERALS) TABS tablet Take 1 tablet by mouth daily.    . Thiamine Mononitrate (VITAMIN B1 PO) Take 1 tablet by mouth daily.    . traMADol (ULTRAM) 50 MG tablet Take 50 mg by mouth every 4 (four) hours as needed for moderate pain.    Marland Kitchen VITAMIN E PO Take 2 tablets by mouth daily.    Marland Kitchen gabapentin (NEURONTIN) 300 MG capsule Take 1 capsule (300 mg total) by mouth 3 (three) times daily. (Patient not taking: Reported on 07/26/2017) 90 capsule 0  . metoCLOPramide (REGLAN) 10 MG tablet Take 1 tablet (10 mg total) by mouth 4 (four) times daily -  before meals and at bedtime. (Patient not taking: Reported on 07/26/2017) 90 tablet 0  . mirtazapine (REMERON) 45 MG tablet Take 1 tablet (45 mg total) by mouth at bedtime. (Patient not taking: Reported on 07/26/2017) 30 tablet 0  . pantoprazole (PROTONIX) 40 MG tablet Take 1 tablet (40 mg total) by mouth 2 (two) times daily with a meal. (Patient not taking: Reported on 07/26/2017) 30 tablet 0  . simvastatin (ZOCOR) 20 MG tablet Take 1 tablet (20 mg total) by mouth daily at 6 PM. (Patient not taking: Reported on 07/26/2017) 30 tablet 0    Musculoskeletal: Strength & Muscle Tone: within normal limits Gait & Station: normal Patient leans:  N/A  Psychiatric Specialty Exam: Physical Exam  Constitutional: She is oriented to person, place, and time. She appears well-developed and well-nourished.  HENT:  Head: Normocephalic.  Neck: Normal range of motion.  Respiratory: Effort normal.  Musculoskeletal: Normal range of motion.  Neurological: She is alert and oriented to person,  place, and time.  Psychiatric: She has a normal mood and affect. Her speech is normal and behavior is normal. Judgment and thought content normal. Cognition and memory are normal.    Review of Systems  Psychiatric/Behavioral: Positive for substance abuse.  All other systems reviewed and are negative.   Blood pressure 123/80, pulse 100, temperature 98.2 F (36.8 C), temperature source Oral, resp. rate 18, height 5' 2"  (1.575 m), weight 36.3 kg (80 lb), SpO2 100 %.Body mass index is 14.63 kg/m.  General Appearance: Casual  Eye Contact:  Good  Speech:  Normal Rate  Volume:  Normal  Mood:  Depressed  Affect:  Constricted  Thought Process:  Coherent and Descriptions of Associations: Intact  Orientation:  Full (Time, Place, and Person)  Thought Content:  WDL and Logical  Suicidal Thoughts:  No  Homicidal Thoughts:  No  Memory:  Immediate;   Good Recent;   Good Remote;   Good  Judgement:  Fair  Insight:  Fair  Psychomotor Activity:  Normal  Concentration:  Concentration: Good and Attention Span: Good  Recall:  [place] of Knowledge:  Fair  Language:  Good  Akathisia:  No  Handed:  Right  AIMS (if indicated):   N/A  Assets:  Leisure Time Physical Health Resilience  ADL's:  Intact  Cognition:  WNL  Sleep:   N/A     Treatment Plan Summary: Daily contact with patient to assess and evaluate symptoms and progress in treatment, Medication management and Plan substance induced mood disorder:  -Crisis stabilization -Medication management:  Restarted medical medications along with Remeron 45 mg at bedtime for depression and appetite, Gabapentin 300 mg TID for anxiety/withdrawal, Haldol 0.5 mg daily for psychosis, and Vistaril 25 mg every six hours PRN anxiety -Individual and substance abuse counseling -Peer support Ayala  Disposition: No evidence of imminent risk to self or others at present.   Patient does not meet criteria for psychiatric inpatient admission.  Waylan Boga,  NP 07/27/2017 10:58 AM   Patient seen face-to-face for psychiatric evaluation, chart reviewed and case discussed with the physician extender and developed treatment plan. Reviewed the information documented and agree with the treatment plan.  Buford Dresser, DO 07/27/17 6:35 PM

## 2017-07-27 NOTE — Discharge Instructions (Signed)
To help you maintain a sober lifestyle, a substance abuse treatment program may be beneficial to you.  Contact [facility] at your earliest opportunity to ask about enrolling in their program: ° °     [facility] °     [address] °     [place], [place] [address] °     [phone] °     New patients are seen at the walk-in clinic every Tuesday from 9:00 am - 12:00 pm °

## 2017-08-01 ENCOUNTER — Other Ambulatory Visit: Payer: Self-pay

## 2017-08-01 ENCOUNTER — Encounter (HOSPITAL_COMMUNITY): Payer: Self-pay | Admitting: Emergency Medicine

## 2017-08-01 DIAGNOSIS — F17290 Nicotine dependence, other tobacco product, uncomplicated: Secondary | ICD-10-CM | POA: Insufficient documentation

## 2017-08-01 DIAGNOSIS — I10 Essential (primary) hypertension: Secondary | ICD-10-CM | POA: Insufficient documentation

## 2017-08-01 DIAGNOSIS — F332 Major depressive disorder, recurrent severe without psychotic features: Secondary | ICD-10-CM | POA: Insufficient documentation

## 2017-08-01 DIAGNOSIS — E119 Type 2 diabetes mellitus without complications: Secondary | ICD-10-CM | POA: Insufficient documentation

## 2017-08-01 DIAGNOSIS — Z79899 Other long term (current) drug therapy: Secondary | ICD-10-CM | POA: Insufficient documentation

## 2017-08-01 DIAGNOSIS — Z794 Long term (current) use of insulin: Secondary | ICD-10-CM | POA: Insufficient documentation

## 2017-08-01 DIAGNOSIS — F419 Anxiety disorder, unspecified: Secondary | ICD-10-CM | POA: Insufficient documentation

## 2017-08-01 DIAGNOSIS — R45851 Suicidal ideations: Secondary | ICD-10-CM | POA: Insufficient documentation

## 2017-08-01 LAB — CBG MONITORING, ED: Glucose-Capillary: 364 mg/dL — ABNORMAL HIGH (ref 65–99)

## 2017-08-01 NOTE — ED Triage Notes (Signed)
Pt reports her mother died around two weeks ago, since then she has "not taken it well.... Been very depressed... Thought about running in front of a car."  Pt denies etoh or SA.  Reports no hx of self harm.

## 2017-08-02 ENCOUNTER — Other Ambulatory Visit: Payer: Self-pay

## 2017-08-02 ENCOUNTER — Encounter (HOSPITAL_COMMUNITY): Payer: Self-pay | Admitting: *Deleted

## 2017-08-02 ENCOUNTER — Emergency Department (HOSPITAL_COMMUNITY)
Admission: EM | Admit: 2017-08-02 | Discharge: 2017-08-02 | Disposition: A | Discharge status: Other Acute Inpatient Hospital | Payer: Federal, State, Local not specified - Other | Attending: Emergency Medicine | Admitting: Emergency Medicine

## 2017-08-02 ENCOUNTER — Inpatient Hospital Stay (HOSPITAL_COMMUNITY)
Admission: AD | Admit: 2017-08-02 | Discharge: 2017-08-07 | DRG: 885 | Disposition: A | Discharge status: Home / Self Care | Payer: Federal, State, Local not specified - Other | Source: Intra-hospital | Attending: Psychiatry | Admitting: Psychiatry

## 2017-08-02 DIAGNOSIS — G4700 Insomnia, unspecified: Secondary | ICD-10-CM | POA: Diagnosis present

## 2017-08-02 DIAGNOSIS — R45851 Suicidal ideations: Secondary | ICD-10-CM | POA: Diagnosis present

## 2017-08-02 DIAGNOSIS — G629 Polyneuropathy, unspecified: Secondary | ICD-10-CM | POA: Diagnosis present

## 2017-08-02 DIAGNOSIS — E785 Hyperlipidemia, unspecified: Secondary | ICD-10-CM | POA: Diagnosis present

## 2017-08-02 DIAGNOSIS — H538 Other visual disturbances: Secondary | ICD-10-CM | POA: Diagnosis present

## 2017-08-02 DIAGNOSIS — F17210 Nicotine dependence, cigarettes, uncomplicated: Secondary | ICD-10-CM | POA: Diagnosis not present

## 2017-08-02 DIAGNOSIS — Z590 Homelessness: Secondary | ICD-10-CM

## 2017-08-02 DIAGNOSIS — F1910 Other psychoactive substance abuse, uncomplicated: Secondary | ICD-10-CM | POA: Diagnosis present

## 2017-08-02 DIAGNOSIS — Z794 Long term (current) use of insulin: Secondary | ICD-10-CM | POA: Diagnosis not present

## 2017-08-02 DIAGNOSIS — R440 Auditory hallucinations: Secondary | ICD-10-CM | POA: Diagnosis not present

## 2017-08-02 DIAGNOSIS — R451 Restlessness and agitation: Secondary | ICD-10-CM | POA: Diagnosis not present

## 2017-08-02 DIAGNOSIS — F17290 Nicotine dependence, other tobacco product, uncomplicated: Secondary | ICD-10-CM | POA: Diagnosis present

## 2017-08-02 DIAGNOSIS — F1020 Alcohol dependence, uncomplicated: Secondary | ICD-10-CM | POA: Diagnosis present

## 2017-08-02 DIAGNOSIS — E119 Type 2 diabetes mellitus without complications: Secondary | ICD-10-CM

## 2017-08-02 DIAGNOSIS — F333 Major depressive disorder, recurrent, severe with psychotic symptoms: Secondary | ICD-10-CM | POA: Diagnosis present

## 2017-08-02 DIAGNOSIS — Z79899 Other long term (current) drug therapy: Secondary | ICD-10-CM

## 2017-08-02 DIAGNOSIS — F1390 Sedative, hypnotic, or anxiolytic use, unspecified, uncomplicated: Secondary | ICD-10-CM | POA: Diagnosis not present

## 2017-08-02 DIAGNOSIS — M791 Myalgia, unspecified site: Secondary | ICD-10-CM | POA: Diagnosis present

## 2017-08-02 DIAGNOSIS — I10 Essential (primary) hypertension: Secondary | ICD-10-CM | POA: Diagnosis present

## 2017-08-02 DIAGNOSIS — F411 Generalized anxiety disorder: Secondary | ICD-10-CM | POA: Diagnosis present

## 2017-08-02 DIAGNOSIS — M792 Neuralgia and neuritis, unspecified: Secondary | ICD-10-CM | POA: Diagnosis present

## 2017-08-02 DIAGNOSIS — E1140 Type 2 diabetes mellitus with diabetic neuropathy, unspecified: Secondary | ICD-10-CM | POA: Diagnosis present

## 2017-08-02 DIAGNOSIS — IMO0001 Reserved for inherently not codable concepts without codable children: Secondary | ICD-10-CM

## 2017-08-02 DIAGNOSIS — F1099 Alcohol use, unspecified with unspecified alcohol-induced disorder: Secondary | ICD-10-CM | POA: Diagnosis not present

## 2017-08-02 DIAGNOSIS — F332 Major depressive disorder, recurrent severe without psychotic features: Secondary | ICD-10-CM

## 2017-08-02 DIAGNOSIS — Z560 Unemployment, unspecified: Secondary | ICD-10-CM | POA: Diagnosis not present

## 2017-08-02 DIAGNOSIS — R42 Dizziness and giddiness: Secondary | ICD-10-CM | POA: Diagnosis present

## 2017-08-02 DIAGNOSIS — K219 Gastro-esophageal reflux disease without esophagitis: Secondary | ICD-10-CM | POA: Diagnosis present

## 2017-08-02 DIAGNOSIS — F209 Schizophrenia, unspecified: Secondary | ICD-10-CM | POA: Insufficient documentation

## 2017-08-02 DIAGNOSIS — Z818 Family history of other mental and behavioral disorders: Secondary | ICD-10-CM

## 2017-08-02 DIAGNOSIS — Z9119 Patient's noncompliance with other medical treatment and regimen: Secondary | ICD-10-CM

## 2017-08-02 DIAGNOSIS — F4312 Post-traumatic stress disorder, chronic: Secondary | ICD-10-CM | POA: Diagnosis present

## 2017-08-02 DIAGNOSIS — Z8659 Personal history of other mental and behavioral disorders: Secondary | ICD-10-CM | POA: Diagnosis not present

## 2017-08-02 DIAGNOSIS — G259 Extrapyramidal and movement disorder, unspecified: Secondary | ICD-10-CM | POA: Diagnosis present

## 2017-08-02 DIAGNOSIS — F419 Anxiety disorder, unspecified: Secondary | ICD-10-CM | POA: Diagnosis not present

## 2017-08-02 LAB — COMPREHENSIVE METABOLIC PANEL
ALT: 38 U/L (ref 14–54)
AST: 32 U/L (ref 15–41)
Albumin: 3.6 g/dL (ref 3.5–5.0)
Alkaline Phosphatase: 113 U/L (ref 38–126)
Anion gap: 12 (ref 5–15)
BUN: 16 mg/dL (ref 6–20)
CO2: 18 mmol/L — ABNORMAL LOW (ref 22–32)
Calcium: 9.1 mg/dL (ref 8.9–10.3)
Chloride: 101 mmol/L (ref 101–111)
Creatinine, Ser: 0.86 mg/dL (ref 0.44–1.00)
GFR calc Af Amer: >60 mL/min (ref >60)
GFR calc non Af Amer: >60 mL/min (ref >60)
Glucose, Bld: 363 mg/dL — ABNORMAL HIGH (ref 65–99)
Potassium: 4.1 mmol/L (ref 3.5–5.1)
Sodium: 131 mmol/L — ABNORMAL LOW (ref 135–145)
Total Bilirubin: 0.7 mg/dL (ref 0.3–1.2)
Total Protein: 6.7 g/dL (ref 6.5–8.1)

## 2017-08-02 LAB — CBC
HCT: 36.3 % (ref 36.0–46.0)
Hemoglobin: 11.7 g/dL — ABNORMAL LOW (ref 12.0–15.0)
MCH: 26.1 pg (ref 26.0–34.0)
MCHC: 32.2 g/dL (ref 30.0–36.0)
MCV: 80.8 fL (ref 78.0–100.0)
Platelets: 324 10*3/uL (ref 150–400)
RBC: 4.49 MIL/uL (ref 3.87–5.11)
RDW: 15.4 % (ref 11.5–15.5)
WBC: 5.9 10*3/uL (ref 4.0–10.5)

## 2017-08-02 LAB — RAPID URINE DRUG SCREEN, HOSP PERFORMED
Amphetamines: NOT DETECTED
Barbiturates: NOT DETECTED
Benzodiazepines: POSITIVE — AB
Cocaine: NOT DETECTED
Opiates: NOT DETECTED
Tetrahydrocannabinol: NOT DETECTED

## 2017-08-02 LAB — CBG MONITORING, ED
Glucose-Capillary: 257 mg/dL — ABNORMAL HIGH (ref 65–99)
Glucose-Capillary: 314 mg/dL — ABNORMAL HIGH (ref 65–99)
Glucose-Capillary: 315 mg/dL — ABNORMAL HIGH (ref 65–99)

## 2017-08-02 LAB — ETHANOL: Alcohol, Ethyl (B): <10 mg/dL (ref <10)

## 2017-08-02 LAB — ACETAMINOPHEN LEVEL: Acetaminophen (Tylenol), Serum: <10 ug/mL — ABNORMAL LOW (ref 10–30)

## 2017-08-02 LAB — I-STAT BETA HCG BLOOD, ED (MC, WL, AP ONLY): I-stat hCG, quantitative: <5 m[IU]/mL (ref <5)

## 2017-08-02 LAB — SALICYLATE LEVEL: Salicylate Lvl: <7 mg/dL (ref 2.8–30.0)

## 2017-08-02 MED ORDER — LISINOPRIL 10 MG PO TABS
10.0000 mg | ORAL_TABLET | Freq: Every day | ORAL | Status: DC
  Administered 2017-08-03 – 2017-08-07 (×5): 10 mg via ORAL
  Filled 2017-08-02: qty 2
  Filled 2017-08-02 (×7): qty 1

## 2017-08-02 MED ORDER — HALOPERIDOL 0.5 MG PO TABS
0.5000 mg | ORAL_TABLET | Freq: Every day | ORAL | Status: DC
  Administered 2017-08-03: 0.5 mg via ORAL
  Filled 2017-08-02 (×2): qty 1

## 2017-08-02 MED ORDER — LISINOPRIL 10 MG PO TABS
10.0000 mg | ORAL_TABLET | Freq: Every day | ORAL | Status: DC
  Administered 2017-08-02: 10 mg via ORAL
  Filled 2017-08-02: qty 1

## 2017-08-02 MED ORDER — MIRTAZAPINE 45 MG PO TABS
45.0000 mg | ORAL_TABLET | Freq: Every day | ORAL | Status: DC
  Administered 2017-08-02 – 2017-08-03 (×2): 45 mg via ORAL
  Filled 2017-08-02 (×2): qty 3
  Filled 2017-08-02 (×3): qty 1

## 2017-08-02 MED ORDER — INSULIN GLARGINE 100 UNIT/ML ~~LOC~~ SOLN
60.0000 [IU] | Freq: Two times a day (BID) | SUBCUTANEOUS | Status: DC
  Administered 2017-08-02 – 2017-08-05 (×6): 60 [IU] via SUBCUTANEOUS

## 2017-08-02 MED ORDER — INSULIN ASPART 100 UNIT/ML ~~LOC~~ SOLN
8.0000 [IU] | Freq: Three times a day (TID) | SUBCUTANEOUS | Status: DC
  Administered 2017-08-03 – 2017-08-06 (×10): 8 [IU] via SUBCUTANEOUS

## 2017-08-02 MED ORDER — GABAPENTIN 400 MG PO CAPS
800.0000 mg | ORAL_CAPSULE | Freq: Three times a day (TID) | ORAL | Status: DC
  Administered 2017-08-02 (×2): 800 mg via ORAL
  Filled 2017-08-02 (×2): qty 2

## 2017-08-02 MED ORDER — MIRTAZAPINE 15 MG PO TABS: 30.0000 mg | ORAL_TABLET | Freq: Every day | ORAL | Status: DC

## 2017-08-02 MED ORDER — METOCLOPRAMIDE HCL 10 MG PO TABS
10.0000 mg | ORAL_TABLET | Freq: Three times a day (TID) | ORAL | Status: DC
  Administered 2017-08-02 – 2017-08-07 (×19): 10 mg via ORAL
  Filled 2017-08-02 (×5): qty 1
  Filled 2017-08-02: qty 2
  Filled 2017-08-02 (×4): qty 1
  Filled 2017-08-02: qty 2
  Filled 2017-08-02 (×2): qty 1
  Filled 2017-08-02: qty 2
  Filled 2017-08-02 (×5): qty 1
  Filled 2017-08-02: qty 2
  Filled 2017-08-02 (×5): qty 1
  Filled 2017-08-02: qty 2
  Filled 2017-08-02: qty 1

## 2017-08-02 MED ORDER — BUSPIRONE HCL 10 MG PO TABS
15.0000 mg | ORAL_TABLET | Freq: Three times a day (TID) | ORAL | Status: DC
  Administered 2017-08-02 (×2): 15 mg via ORAL
  Filled 2017-08-02 (×2): qty 2

## 2017-08-02 MED ORDER — INSULIN GLARGINE 100 UNIT/ML ~~LOC~~ SOLN
60.0000 [IU] | Freq: Two times a day (BID) | SUBCUTANEOUS | Status: DC
  Administered 2017-08-02: 60 [IU] via SUBCUTANEOUS
  Filled 2017-08-02 (×2): qty 0.6

## 2017-08-02 MED ORDER — SIMVASTATIN 20 MG PO TABS
20.0000 mg | ORAL_TABLET | Freq: Every day | ORAL | Status: DC
  Administered 2017-08-03 – 2017-08-06 (×4): 20 mg via ORAL
  Filled 2017-08-02 (×6): qty 1

## 2017-08-02 MED ORDER — DIVALPROEX SODIUM 250 MG PO DR TAB
750.0000 mg | DELAYED_RELEASE_TABLET | Freq: Two times a day (BID) | ORAL | Status: DC
  Administered 2017-08-02: 750 mg via ORAL
  Filled 2017-08-02: qty 3

## 2017-08-02 MED ORDER — ACETAMINOPHEN 325 MG PO TABS
650.0000 mg | ORAL_TABLET | Freq: Four times a day (QID) | ORAL | Status: DC | PRN
  Administered 2017-08-02 – 2017-08-03 (×4): 650 mg via ORAL
  Filled 2017-08-02 (×5): qty 2

## 2017-08-02 MED ORDER — GABAPENTIN 300 MG PO CAPS
300.0000 mg | ORAL_CAPSULE | Freq: Three times a day (TID) | ORAL | Status: DC
  Administered 2017-08-02 – 2017-08-04 (×5): 300 mg via ORAL
  Filled 2017-08-02 (×12): qty 1

## 2017-08-02 MED ORDER — NICOTINE 21 MG/24HR TD PT24
21.0000 mg | MEDICATED_PATCH | Freq: Every day | TRANSDERMAL | Status: DC
  Administered 2017-08-03 – 2017-08-07 (×5): 21 mg via TRANSDERMAL
  Filled 2017-08-02 (×8): qty 1

## 2017-08-02 MED ORDER — PANTOPRAZOLE SODIUM 40 MG PO TBEC
40.0000 mg | DELAYED_RELEASE_TABLET | Freq: Two times a day (BID) | ORAL | Status: DC
  Administered 2017-08-03 – 2017-08-07 (×9): 40 mg via ORAL
  Filled 2017-08-02 (×13): qty 1

## 2017-08-02 MED ORDER — MAGNESIUM HYDROXIDE 400 MG/5ML PO SUSP: 30.0000 mL | Freq: Every day | ORAL | Status: DC | PRN

## 2017-08-02 MED ORDER — HYDROXYZINE HCL 25 MG PO TABS: 25.0000 mg | ORAL_TABLET | Freq: Four times a day (QID) | ORAL | Status: DC | PRN

## 2017-08-02 MED ORDER — INSULIN ASPART 100 UNIT/ML ~~LOC~~ SOLN
8.0000 [IU] | Freq: Three times a day (TID) | SUBCUTANEOUS | Status: DC
  Administered 2017-08-02 (×3): 8 [IU] via SUBCUTANEOUS
  Filled 2017-08-02 (×2): qty 1

## 2017-08-02 MED ORDER — ALUM & MAG HYDROXIDE-SIMETH 200-200-20 MG/5ML PO SUSP: 30.0000 mL | ORAL | Status: DC | PRN

## 2017-08-02 MED ORDER — ATORVASTATIN CALCIUM 40 MG PO TABS
40.0000 mg | ORAL_TABLET | Freq: Every day | ORAL | Status: DC
  Administered 2017-08-02: 40 mg via ORAL
  Filled 2017-08-02: qty 1

## 2017-08-02 MED ORDER — IBUPROFEN 800 MG PO TABS
800.0000 mg | ORAL_TABLET | Freq: Three times a day (TID) | ORAL | Status: DC | PRN
  Filled 2017-08-02: qty 1

## 2017-08-02 NOTE — Progress Notes (Signed)
Carolyn Ayala is a 42 year old female pt admitted on voluntary basis. On admission, she spoke about how she recently lost her mother and has been having difficulties coping with her loss. She reports that she has not been taking her medications like she should and spoke about how she has not been taking any haldol since the last time she was here. She does endorse on-going depression but denies current SI and is able to contract for safety while in the hospital. She reports that she was living in [place] and just moved back into the area recently. She denies any A/V hallucinations and does not display any overt signs or symptoms of withdrawal. She reports that she is living with her sister but reports that she would like to go to some type of alcohol treatment program when she gets discharged. She was oriented to the unit and safety maintained.

## 2017-08-02 NOTE — Tx Team (Signed)
Initial Treatment Plan 08/02/2017 7:18 PM Renford Dillsomekia Denise Parke YQM:[id number]    PATIENT STRESSORS: Health problems Loss of mother Medication change or noncompliance Occupational concerns   PATIENT STRENGTHS: Ability for insight Average or above average intelligence Capable of independent living General fund of knowledge   PATIENT IDENTIFIED PROBLEMS: Depression Alcohol Abuse Suicidal thoughts "Need coping skills" "Help with alcohol"                     DISCHARGE CRITERIA:  Ability to meet basic life and health needs Improved stabilization in mood, thinking, and/or behavior Verbal commitment to aftercare and medication compliance Withdrawal symptoms are absent or subacute and managed without 24-hour nursing intervention  PRELIMINARY DISCHARGE PLAN: Attend aftercare/continuing care group Placement in alternative living arrangements  PATIENT/FAMILY INVOLVEMENT: This treatment plan has been presented to and reviewed with the patient, Jacklynn Ganongomekia Denise Davern, and/or family member, .  The patient and family have been given the opportunity to ask questions and make suggestions.  Caelie [place], [place], [place] 08/02/2017, 7:18 PM

## 2017-08-02 NOTE — Progress Notes (Signed)
  DATA ACTION RESPONSE  Objective- Pt. is visible in the dayroom, seen eating a snack.Presents with a  depressed/anxious affect and mood. Pt states "I just want to work on my depression and get back on my medications". Pt endorses withdrawal s/s this evening. Subjective- Denies having any HI/VH/Pain at this time. Endorses passive SI but verbal contracts for safety. +AH. Is cooperative and remains safe on the unit.  1:1 interaction in private to establish rapport. Encouragement, education, & support given from staff.    Safety maintained with Q 15 checks. Continue with POC.

## 2017-08-02 NOTE — Progress Notes (Signed)
Pt refused bedtime fingerstick provided with encouragement.

## 2017-08-02 NOTE — ED Provider Notes (Signed)
[facility] EMERGENCY DEPARTMENT Provider Note   CSN: [id number] Arrival date & time: 08/01/17  2327     History   Chief Complaint Chief Complaint  Patient presents with  . Suicidal  . Depression  . Alcohol Problem  . Anxiety    HPI Carolyn Ayala is a 42 y.o. female.  42 year old female with a history of depression, diabetes, hypertension presents to the emergency department for worsening depression and suicidal ideations.  She reports worsening depression secondary to the passing of her mother a few weeks ago.  Initially, patient without reports of suicidal plan.  She next exclaims that she would consider jumping in front of a car.  She denies any alcohol or drug use today.  She does report a history of behavioral health hospitalization in the past.  Patient very agitated by my questioning stating that she "needs some rest".   The history is provided by the patient. No language interpreter was used.    Past Medical History:  Diagnosis Date  . Depression   . Diabetes mellitus without complication (HCC)   . Hypertension   . Mental disorder   . Noncompliance     Patient Active Problem List   Diagnosis Date Noted  . History of command hallucinations   . Suicidal thoughts   . Abdominal pain   . Diabetic ketoacidosis without coma associated with type 2 diabetes mellitus (HCC)   . Gastroparesis   . Hypokalemia   . DKA (diabetic ketoacidoses) (HCC) 09/24/2015  . HTN (hypertension) 09/24/2015  . Diabetes mellitus without complication (HCC)   . Hyperlipidemia 08/10/2015  . Alcohol use disorder, moderate, dependence (HCC) 08/10/2015  . Neuropathic pain 08/08/2015  . Seasonal allergic rhinitis 08/08/2015  . 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 Uncontrolled diabetes mellitus with neurologic complication, with long-term current use of insulin (HCC)   . Uncontrolled diabetes mellitus with diabetic neuropathy, with  long-term current use of insulin (HCC) 04/21/2015  . Generalized anxiety disorder 02/13/2013  . Chronic posttraumatic stress disorder 02/13/2013  . Substance induced mood disorder (HCC) 02/08/2013  . IDDM (insulin dependent diabetes mellitus) (HCC) 02/08/2013    Past Surgical History:  Procedure Laterality Date  . CESAREAN SECTION      OB History    No data available       Home Medications    Prior to Admission medications   Medication Sig Start Date End Date Taking? Authorizing Provider  atorvastatin (LIPITOR) 40 MG tablet Take 40 mg by mouth daily.    [provider]  busPIRone (BUSPAR) 15 MG tablet Take 15 mg by mouth 3 (three) times daily.    [provider]  divalproex (DEPAKOTE) 500 MG DR tablet Take 750 mg by mouth 2 (two) times daily.    [provider]  gabapentin (NEURONTIN) 300 MG capsule Take 1 capsule (300 mg total) by mouth 3 (three) times daily. Patient not taking: Reported on 07/26/2017 10/02/15   Vassie Loll, MD  gabapentin (NEURONTIN) 400 MG capsule Take 800 mg by mouth 3 (three) times daily.    [provider]  haloperidol (HALDOL) 0.5 MG tablet Take 1 tablet (0.5 mg total) by mouth daily at 12 noon. Patient taking differently: Take 0.5 mg by mouth 2 (two) times daily.  10/13/15   Earney Navy, NP  hydrOXYzine (ATARAX/VISTARIL) 50 MG tablet Take 0.5 tablets (25 mg total) by mouth every 6 (six) hours as needed for anxiety. Patient taking differently: Take 50 mg  by mouth every 6 (six) hours as needed for anxiety.  10/13/15   Earney Navynuoha, Josephine C, NP  ibuprofen (ADVIL,MOTRIN) 800 MG tablet Take 800 mg by mouth every 8 (eight) hours as needed for moderate pain.    [provider]  insulin aspart (NOVOLOG) 100 UNIT/ML injection Inject 8 Units into the skin 3 (three) times daily with meals. Patient taking differently: Inject 0-40 Units into the skin 3 (three) times daily with meals. Sliding scale 10/13/15   Jeralyn BennettZamora, Ezequiel,  MD  insulin glargine (LANTUS) 100 UNIT/ML injection Inject 0.6 mLs (60 Units total) into the skin 2 (two) times daily. 10/13/15   Jeralyn BennettZamora, Ezequiel, MD  lisinopril (PRINIVIL,ZESTRIL) 10 MG tablet Take 1 tablet (10 mg total) by mouth daily. 10/13/15   Earney Navynuoha, Josephine C, NP  methocarbamol (ROBAXIN) 500 MG tablet Take 500 mg by mouth every 6 (six) hours as needed for muscle spasms.    [provider]  metoCLOPramide (REGLAN) 10 MG tablet Take 1 tablet (10 mg total) by mouth 4 (four) times daily -  before meals and at bedtime. Patient not taking: Reported on 07/26/2017 10/13/15   Earney Navynuoha, Josephine C, NP  mirtazapine (REMERON) 30 MG tablet Take 30 mg by mouth at bedtime.    [provider]  mirtazapine (REMERON) 45 MG tablet Take 1 tablet (45 mg total) by mouth at bedtime. Patient not taking: Reported on 07/26/2017 10/13/15   Earney Navynuoha, Josephine C, NP  Multiple Vitamin (MULTIVITAMIN WITH MINERALS) TABS tablet Take 1 tablet by mouth daily.    [provider]  pantoprazole (PROTONIX) 40 MG tablet Take 1 tablet (40 mg total) by mouth 2 (two) times daily with a meal. Patient not taking: Reported on 07/26/2017 10/13/15   Earney Navynuoha, Josephine C, NP  simvastatin (ZOCOR) 20 MG tablet Take 1 tablet (20 mg total) by mouth daily at 6 PM. Patient not taking: Reported on 07/26/2017 10/13/15   Earney Navynuoha, Josephine C, NP  Thiamine Mononitrate (VITAMIN B1 PO) Take 1 tablet by mouth daily.    [provider]  traMADol (ULTRAM) 50 MG tablet Take 50 mg by mouth every 4 (four) hours as needed for moderate pain.    [provider]  VITAMIN E PO Take 2 tablets by mouth daily.    [provider]    Family History Family History  Problem Relation Age of Onset  . Depression Mother     Social History Social History   Tobacco Use  . Smoking status: Never Smoker  . Smokeless tobacco: Current User  Substance Use Topics  . Alcohol use: Yes    Comment:  Daily use -- a 1/5 daily  . Drug  use: Yes    Frequency: 7.0 times per week    Types: Barbituates, Benzodiazepines    Comment: Pt denied to author     Allergies   Patient has no known allergies.   Review of Systems Review of Systems Ten systems reviewed and are negative for acute change, except as noted in the HPI.    Physical Exam Updated Vital Signs BP (!) 141/89 (BP Location: Right Arm)   Pulse (!) 110   Temp 98.3 F (36.8 C) (Oral)   SpO2 99%   Physical Exam  Constitutional: She is oriented to person, place, and time. She appears well-developed and well-nourished. No distress.  Nontoxic appearing and in no acute distress  HENT:  Head: Normocephalic and atraumatic.  Eyes: Conjunctivae and EOM are normal. No scleral icterus.  Neck: Normal range of motion.  Pulmonary/Chest: Effort normal. No respiratory distress.  Respirations even and unlabored  Musculoskeletal: Normal range of motion.  Neurological: She is alert and oriented to person, place, and time. She exhibits normal muscle tone. Coordination normal.  Skin: Skin is warm and dry. No rash noted. She is not diaphoretic. No erythema. No pallor.  Psychiatric: She has a normal mood and affect. She is agitated. She expresses suicidal ideation. She expresses no homicidal ideation. She expresses no homicidal plans.  Initially denies plan of suicide, then exclaims that she may jump in front of a car.  Nursing note and vitals reviewed.    ED Treatments / Results  Labs (all labs ordered are listed, but only abnormal results are displayed) Labs Reviewed  COMPREHENSIVE METABOLIC PANEL - Abnormal; Notable for the following components:      Result Value   Sodium 131 (*)    CO2 18 (*)    Glucose, Bld 363 (*)    All other components within normal limits  ACETAMINOPHEN LEVEL - Abnormal; Notable for the following components:   Acetaminophen (Tylenol), Serum <10 (*)    All other components within normal limits  CBC - Abnormal; Notable for the following  components:   Hemoglobin 11.7 (*)    All other components within normal limits  RAPID URINE DRUG SCREEN, HOSP PERFORMED - Abnormal; Notable for the following components:   Benzodiazepines POSITIVE (*)    All other components within normal limits  CBG MONITORING, ED - Abnormal; Notable for the following components:   Glucose-Capillary 364 (*)    All other components within normal limits  ETHANOL  SALICYLATE LEVEL  I-STAT BETA HCG BLOOD, ED ([facility], [facility], [facility] ONLY)    EKG  EKG Interpretation None       Radiology No results found.  Procedures Procedures (including critical care time)  Medications Ordered in ED Medications  atorvastatin (LIPITOR) tablet 40 mg (not administered)  busPIRone (BUSPAR) tablet 15 mg (not administered)  divalproex (DEPAKOTE) DR tablet 750 mg (not administered)  gabapentin (NEURONTIN) capsule 800 mg (not administered)  hydrOXYzine (ATARAX/VISTARIL) tablet 25 mg (not administered)  ibuprofen (ADVIL,MOTRIN) tablet 800 mg (not administered)  lisinopril (PRINIVIL,ZESTRIL) tablet 10 mg (not administered)  mirtazapine (REMERON) tablet 30 mg (not administered)  insulin aspart (novoLOG) injection 8 Units (not administered)  insulin glargine (LANTUS) injection 60 Units (60 Units Subcutaneous Given 08/02/17 0431)     Initial Impression / Assessment and Plan / ED Course  I have reviewed the triage vital signs and the nursing notes.  Pertinent labs & imaging results that were available during my care of the patient were reviewed by me and considered in my medical decision making (see chart for details).     41 year old female presents for worsening depression.  She has been medically cleared and evaluated by TTS who recommend inpatient management.  No beds available at [facility].  TTS to seek placement in the morning.  Daily medications ordered while patient remains in the ED.  Disposition to be determined by oncoming ED provider.   Final Clinical Impressions(s) / ED  Diagnoses   Final diagnoses:  Severe episode of recurrent major depressive disorder, without psychotic features [facility])    ED Discharge Orders    None       Antony Madura, PA-C 08/02/17 0448    Zadie Rhine, MD 08/02/17 [phone]

## 2017-08-02 NOTE — ED Notes (Signed)
TTS begun 
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
## 2017-08-02 NOTE — Progress Notes (Addendum)
[facility] Shuvon Rankin, NP is the accepting provider.  Dr. Altamese Carolinaainville is the attending provider.  Call report to [phone]  [facility].   Pt is Voluntary.  Pt may be transported by [facility]  [facility]  [facility]30 PM Carney BernJean T. Kaylyn LimSutter, MSW, LCSWA Disposition Clinical Social Work [phone] (cell) [phone] (office) .

## 2017-08-02 NOTE — ED Notes (Signed)
Pt given meal tray per request of BH for patient to come after meal and insulin within the time frame of 5-530 pm.

## 2017-08-02 NOTE — ED Notes (Signed)
Carb Modified Diet ordeded for dinner, to be brought early before [facility].

## 2017-08-03 DIAGNOSIS — R440 Auditory hallucinations: Secondary | ICD-10-CM

## 2017-08-03 DIAGNOSIS — F1099 Alcohol use, unspecified with unspecified alcohol-induced disorder: Secondary | ICD-10-CM

## 2017-08-03 DIAGNOSIS — F333 Major depressive disorder, recurrent, severe with psychotic symptoms: Principal | ICD-10-CM

## 2017-08-03 DIAGNOSIS — I10 Essential (primary) hypertension: Secondary | ICD-10-CM

## 2017-08-03 DIAGNOSIS — R45851 Suicidal ideations: Secondary | ICD-10-CM

## 2017-08-03 DIAGNOSIS — R450 Nervousness: Secondary | ICD-10-CM

## 2017-08-03 DIAGNOSIS — E119 Type 2 diabetes mellitus without complications: Secondary | ICD-10-CM

## 2017-08-03 DIAGNOSIS — F4310 Post-traumatic stress disorder, unspecified: Secondary | ICD-10-CM

## 2017-08-03 DIAGNOSIS — Z590 Homelessness: Secondary | ICD-10-CM

## 2017-08-03 DIAGNOSIS — Z818 Family history of other mental and behavioral disorders: Secondary | ICD-10-CM

## 2017-08-03 DIAGNOSIS — E785 Hyperlipidemia, unspecified: Secondary | ICD-10-CM

## 2017-08-03 DIAGNOSIS — F419 Anxiety disorder, unspecified: Secondary | ICD-10-CM

## 2017-08-03 LAB — GLUCOSE, CAPILLARY
Glucose-Capillary: 393 mg/dL — ABNORMAL HIGH (ref 65–99)
Glucose-Capillary: 318 mg/dL — ABNORMAL HIGH (ref 65–99)
Glucose-Capillary: 458 mg/dL — ABNORMAL HIGH (ref 65–99)

## 2017-08-03 NOTE — Progress Notes (Signed)
Patient ID: Carolyn Ayala Carolyn Ayala, female   DOB: 06-Oct-1975, 42 y.o.   MRN: [id number] D: Patient reports that her sleep quality last night was fair, reports her appetite as good, and reports her concentration level today as poor.  Pt reports her depression as being "10" ( with 10 being the worst), rates her hopelessness level as "10" (10 being the worst), and rates her anxiety as "10" (10 being the worst).  Pt lists on her self inventory sheet that she is having tremors, diarrhea, cravings, agitation, runny nose, nausea and irritability.  Upon assessment by this RN, pt is calm/cooperative, is not agitated, does not seem anxious, and does not have any tremors.  Pt has also not complained to staff about nausea, and is currently not irritable.  Pt lists her goal for today as "coping skills", and reports being at the hospital due to worsening depression from the death of her mother.  Pt denies SI/HI/AVH.  A: Pt given all meds as scheduled, and is being maintained on Q15 minute checks for safety.   R: Pt denies any current concerns, will continue to monitor.

## 2017-08-03 NOTE — Progress Notes (Signed)
Recreation Therapy Notes  INPATIENT RECREATION THERAPY ASSESSMENT  Patient Details Name: Carolyn Ayala Carolyn Ayala MRN: [id number] DOB: [birth year] Today's Date: 08/03/2017       Information Obtained From: Patient  Able to Participate in Assessment/Interview: Yes  Patient Presentation: Alert, Oriented  Reason for Admission (Per Patient): Suicidal Ideation  Patient Stressors: Death, Other (Comment)(Alcohol)  Pt stated her mother died 15 days ago.  Coping Skills:   Isolation, Avoidance, Arguments, Aggression, Substance Abuse, Prayer, Hot Bath/Shower, Journal, Read, Talk, Art, Music, TV, Other (Comment)(Word searches)  Leisure Interests (2+):  Games - Careers adviserWord-search, Individual - Reading, Individual - Other (Comment)(Cooking)  Frequency of Recreation/Participation: Other (Comment)(Daily)  Awareness of Community Resources:  No  Expressed Interest in [employer] Resource Information: Yes  County of Residence:  [place]  Patient Main Form of Transportation: Therapist, musicublic Transportation  Patient Strengths:  Faith in God  Patient Identified Areas of Improvement:  Drinking; Attitude  Patient Goal for Hospitalization:  "Coping skills"  Current SI (including self-harm):  No  Current HI:  No  Current AVH: No  Staff Intervention Plan: Group Attendance  Consent to Intern Participation: N/A   Caroll RancherMarjette Kennan Detter, LRT/CTRS  Caroll RancherLindsay, Durwood Dittus A 08/03/2017, 9:40 AM

## 2017-08-03 NOTE — [facility] Suicide Risk Assessment (Signed)
[facility]   Nursing information obtained from:    Demographic factors:    Current Mental Status:    Loss Factors:    Historical Factors:    Risk Reduction Factors:     Total Time spent with patient: 1 hour Principal Problem: Depression, major, recurrent, severe with psychosis (HCC) Diagnosis:   Patient Active Problem List   Diagnosis Date Noted  . Alcohol use disorder, moderate, dependence (HCC) [F10.20] 08/10/2015    Priority: High  . 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 IDDM (insulin dependent diabetes mellitus) (HCC) [E11.9, Z79.4] 02/08/2013    Priority: High  . Suicidal thoughts [R45.851]     Priority: Medium  . Hypokalemia [E87.6]   . HTN (hypertension) [I10] 09/24/2015  . Hyperlipidemia [E78.5] 08/10/2015  . Neuropathic pain [M79.2] 08/08/2015  . Generalized anxiety disorder [F41.1] 02/13/2013  . Chronic posttraumatic stress disorder [F43.12] 02/13/2013   Subjective Data: Patient is a 42 year old female [facility] and treatment of worsening of depression with auditory hallucinations and alcohol use.  Patient reported that she was suicidal, could not contract for safety, reported recent loss of her mother, a plan to overdose on pills  Continued Clinical Symptoms:  Alcohol Use Disorder Identification Test Final Score (AUDIT): 37 The "Alcohol Use Disorders Identification Test", Guidelines for Use in Primary Care, Second Edition.  World Science writerHealth Organization [facility](WHO). Score between 0-7:  no or low risk or alcohol related problems. Score between 8-15:  moderate risk of alcohol related problems. Score between 16-19:  high risk of alcohol related problems. Score 20 or above:  warrants further diagnostic evaluation for alcohol dependence and treatment.   CLINICAL FACTORS:   Severe Anxiety and/or Agitation Depression:   Comorbid alcohol  abuse/dependence Hopelessness Impulsivity Severe Alcohol/Substance Abuse/Dependencies More than one psychiatric diagnosis Unstable or Poor Therapeutic Relationship Previous Psychiatric Diagnoses and Treatments   Musculoskeletal: Strength & Muscle Tone: within normal limits Gait & Station: normal Patient leans: N/A  Psychiatric Specialty Exam: Physical Exam  ROS  .  Please see H&P for details   COGNITIVE FEATURES THAT CONTRIBUTE TO RISK:  Closed-mindedness and Thought constriction (tunnel vision)    SUICIDE RISK:   Severe:  Frequent, intense, and enduring suicidal ideation, specific plan, no subjective intent, but some objective markers of intent (i.e., choice of lethal method), the method is accessible, some limited preparatory behavior, evidence of impaired self-control, severe dysphoria/symptomatology, multiple risk factors present, and few if any protective factors, particularly a lack of social support.  PLAN OF CARE: While here patient will undergo cognitive behavioral therapy, desensitization, coping skills training, substance use education along with management of her diabetes.  Patient will also undergo grief counseling due to her recent loss of her mom.  Patient is also [facility] to help her become independent and have a place to live on discharge    I certify that inpatient services furnished can reasonably be expected to improve the patient's condition.   Nelly RoutArchana Harjot Dibello, MD 08/03/2017, 11:56 AM

## 2017-08-03 NOTE — Progress Notes (Signed)
Patient ID: Carolyn Ayala, female   DOB: 04-Dec-1975, 42 y.o.   MRN: [id number] [name] blood glucose prior to dinner was 458mg /dl.  Patient was given scheduled dose of insulin Novolog 8 units and Insulin Lantus 60units, and Dr Jama Flavorsobos was notified.  No new orders given, as pt reported that she had eaten "a big slice of carrot cake" shortly prior to the blood glucose being taken.

## 2017-08-03 NOTE — B[facility] Suicide Risk Assessment (Signed)
[facility] INPATIENT:  Family/Significant Other Suicide Prevention Education  Suicide Prevention Education:  Patient Refusal for Family/Significant Other Suicide Prevention Education: The patient Carolyn Ayala has refused to provide written consent for family/significant other to be provided Family/Significant Other Suicide Prevention Education during admission and/or prior to discharge.  Physician notified.  Baldo DaubRodney B [facility] 08/03/2017, 2:02 PM

## 2017-08-03 NOTE — Progress Notes (Signed)
Pt attended evening wrap up group and stated today was a 5, she was able to talk to her SW who helped with her needs. She mentioned if she could go anywhere she would like to visit [place] again.

## 2017-08-03 NOTE — Progress Notes (Signed)
Recreation Therapy Notes  Date: 08/03/17 Time: 0950 Location: 500 Hall Dayroom  Group Topic: Wellness  Goal Area(s) Addresses:  Patient will define components of whole wellness. Patient will verbalize benefit of whole wellness.  Behavioral Response: Engaged  Intervention: Music  Activity: Exercise.  LRT went over the importance of exercise and physical activity with patients.  LRT played music and lead patients through a series of stretches before going into more extensive exercises (squats, wall push ups, seated crunches, jogging in place, etc).  Education: Wellness, Building control surveyorDischarge Planning.   Education Outcome: Acknowledges education/In group clarification offered/Needs additional education.   Clinical Observations/Feedback: Pt was sitting and reading some papers in her folder.  Pt participated when prompted by staff.  Pt became fully engaged.  Pt was smiling and active.  Pt left early to meet with doctor and did not return.    Caroll RancherMarjette Andruw Battie, LRT/CTRS    Caroll RancherLindsay, Kydan Shanholtzer A 08/03/2017 10:52 AM
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
## 2017-08-03 NOTE — BHH Group Notes (Signed)
LCSW Group Therapy Note 08/03/2017 12:54 PM  Type of Therapy/Topic: Group Therapy: Emotion Regulation  Participation Level: None   Description of Group:  The purpose of this group is to assist patients in learning to regulate negative emotions and experience positive emotions. Patients will be guided to discuss ways in which they have been vulnerable to their negative emotions. These vulnerabilities will be juxtaposed with experiences of positive emotions or situations, and patients will be challenged to use positive emotions to combat negative ones. Special emphasis will be placed on coping with negative emotions in conflict situations, and patients will process healthy conflict resolution skills.  Therapeutic Goals: 1. Patient will identify two positive emotions or experiences to reflect on in order to balance out negative emotions 2. Patient will label two or more emotions that they find the most difficult to experience 3. Patient will demonstrate positive conflict resolution skills through discussion and/or role plays  Summary of Patient Progress:  Carloyn did not engage or participate in today's discussion. She was asleep majority of the group session.    Therapeutic Modalities:  Cognitive Behavioral Therapy Feelings Identification Dialectical Behavioral Therapy   Alcario DroughtJolan Jashay Roddy LCSWA Clinical Social Worker
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
## 2017-08-04 DIAGNOSIS — Z634 Disappearance and death of family member: Secondary | ICD-10-CM

## 2017-08-04 DIAGNOSIS — F411 Generalized anxiety disorder: Secondary | ICD-10-CM

## 2017-08-04 DIAGNOSIS — F1390 Sedative, hypnotic, or anxiolytic use, unspecified, uncomplicated: Secondary | ICD-10-CM

## 2017-08-04 DIAGNOSIS — F1990 Other psychoactive substance use, unspecified, uncomplicated: Secondary | ICD-10-CM

## 2017-08-04 DIAGNOSIS — F17210 Nicotine dependence, cigarettes, uncomplicated: Secondary | ICD-10-CM

## 2017-08-04 DIAGNOSIS — F1020 Alcohol dependence, uncomplicated: Secondary | ICD-10-CM

## 2017-08-04 DIAGNOSIS — F4312 Post-traumatic stress disorder, chronic: Secondary | ICD-10-CM

## 2017-08-04 LAB — GLUCOSE, CAPILLARY
Glucose-Capillary: 156 mg/dL — ABNORMAL HIGH (ref 65–99)
Glucose-Capillary: 214 mg/dL — ABNORMAL HIGH (ref 65–99)
Glucose-Capillary: 223 mg/dL — ABNORMAL HIGH (ref 65–99)

## 2017-08-04 MED ORDER — BUSPIRONE HCL 15 MG PO TABS
15.0000 mg | ORAL_TABLET | Freq: Three times a day (TID) | ORAL | Status: DC
  Administered 2017-08-04 – 2017-08-07 (×10): 15 mg via ORAL
  Filled 2017-08-04: qty 1
  Filled 2017-08-04: qty 3
  Filled 2017-08-04 (×3): qty 1
  Filled 2017-08-04: qty 3
  Filled 2017-08-04 (×8): qty 1

## 2017-08-04 MED ORDER — INSULIN ASPART 100 UNIT/ML ~~LOC~~ SOLN
0.0000 [IU] | Freq: Three times a day (TID) | SUBCUTANEOUS | Status: DC
  Administered 2017-08-04: 20 [IU] via SUBCUTANEOUS
  Administered 2017-08-04: 7 [IU] via SUBCUTANEOUS
  Administered 2017-08-05: 15 [IU] via SUBCUTANEOUS
  Administered 2017-08-05: 20 [IU] via SUBCUTANEOUS
  Administered 2017-08-06: 4 [IU] via SUBCUTANEOUS
  Administered 2017-08-06: 11 [IU] via SUBCUTANEOUS
  Administered 2017-08-06: 20 [IU] via SUBCUTANEOUS
  Administered 2017-08-07: 11 [IU] via SUBCUTANEOUS

## 2017-08-04 MED ORDER — MIRTAZAPINE 15 MG PO TABS
15.0000 mg | ORAL_TABLET | Freq: Every day | ORAL | Status: DC
  Administered 2017-08-04 – 2017-08-06 (×3): 15 mg via ORAL
  Filled 2017-08-04 (×5): qty 1

## 2017-08-04 MED ORDER — CHLORDIAZEPOXIDE HCL 25 MG PO CAPS
25.0000 mg | ORAL_CAPSULE | Freq: Four times a day (QID) | ORAL | Status: DC | PRN
  Administered 2017-08-04 – 2017-08-07 (×9): 25 mg via ORAL
  Filled 2017-08-04 (×9): qty 1

## 2017-08-04 MED ORDER — GABAPENTIN 300 MG PO CAPS
600.0000 mg | ORAL_CAPSULE | Freq: Three times a day (TID) | ORAL | Status: DC
  Administered 2017-08-04 – 2017-08-07 (×10): 600 mg via ORAL
  Filled 2017-08-04 (×12): qty 2

## 2017-08-04 MED ORDER — HALOPERIDOL 5 MG PO TABS
5.0000 mg | ORAL_TABLET | Freq: Two times a day (BID) | ORAL | Status: DC
  Administered 2017-08-04 – 2017-08-05 (×3): 5 mg via ORAL
  Filled 2017-08-04 (×5): qty 1

## 2017-08-04 MED ORDER — BENZTROPINE MESYLATE 0.5 MG PO TABS
0.5000 mg | ORAL_TABLET | Freq: Two times a day (BID) | ORAL | Status: DC
  Administered 2017-08-04 – 2017-08-07 (×6): 0.5 mg via ORAL
  Filled 2017-08-04 (×8): qty 1

## 2017-08-04 NOTE — Progress Notes (Signed)
[facility]  08/04/2017 2:32 PM Jacklynn Ganongomekia Denise Zale  MRN:  [id number]  Subjective: Areona reports, "I'm still feeling suicidal today. I recently lost my mother. She was buried 16 days ago. Since her death, I can't stop crying. I got more depressed, then became suicidal. I wanted to run out in front of a moving car. Prior to all these, I stopped taking my mental health medications a long time ago. My mind is racing & I'm hearing voices".  Objective: Patient is a 42 year old female [facility] for stabilization and treatment of worsening of depression along with hearing voices telling her that she was better off dead.  Patient also gives history of drinking 2/5 a day and adds that that the way she has been coping with her depression for many years now.  Patient reports that [place] mother's funeral, has decided to stay here. Patient states that she is okay with [facility] as long as they take people with diabetes. Patient states that her sugars have been out of control, as that she has blurry vision at times because of her diabetes.  She states that she is on 2 types of insulin but still her sugars fluctuate a lot.  She has that she wants her diabetes stabilized, feels that her depression might be contributing to her blood sugar fluctuations Today, 08-04-17, [name] is seen, chart reviewed. The chart findings discussed with the treatment team. She presents alert, oriented x 4. She is visible on the unit, attending & participating in the group sessions.  She presents with a bright affect, however, is still endorsing worsening depression, racing thoughts & auditory hallucinations. She says her depression worsened due to the recent death of her mother. She adds that she has stopped taking her medications a long time ago. He medications has been re-started with some dose adjustments made. Her other pertinent home  medications for her other medical issues re-started. Will obtain HGBa1c & order diabetic consult. She denies any adverse effects from her medications. She has agreed with the current plan of acre. She does not appear to be responding to any internal; stimuli. Staff continues to provide support. She is able to verbally contract for safety.  Principal Problem: Depression, major, recurrent, severe with psychosis (HCC)  Diagnosis:   Patient Active Problem List   Diagnosis Date Noted  . Suicidal thoughts [R45.851]   . Hypokalemia [E87.6]   . HTN (hypertension) [I10] 09/24/2015  . Hyperlipidemia [E78.5] 08/10/2015  . Alcohol use disorder, moderate, dependence (HCC) [F10.20] 08/10/2015  . Neuropathic pain [M79.2] 08/08/2015  . 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 Generalized anxiety disorder [F41.1] 02/13/2013  . Chronic posttraumatic stress disorder [F43.12] 02/13/2013  . IDDM (insulin dependent diabetes mellitus) (HCC) [E11.9, Z79.4] 02/08/2013   Total Time spent with patient: 25 minutes  Past Psychiatric History: See H&P  Past Medical History:  Past Medical History:  Diagnosis Date  . Depression   . Diabetes mellitus without complication (HCC)   . Hypertension   . Mental disorder   . Noncompliance     Past Surgical History:  Procedure Laterality Date  . CESAREAN SECTION     Family History:  Family History  Problem Relation Age of Onset  . Depression Mother    Family Psychiatric  History: See H&P  Social History:  Social History   Substance and Sexual Activity  Alcohol Use Yes  Comment:  Daily use -- a 1/5 daily     Social History   Substance and Sexual Activity  Drug Use Yes  . Frequency: 7.0 times per week  . Types: Barbituates, Benzodiazepines   Comment: Pt denied to Chartered loss adjuster    Social History   Socioeconomic History  . Marital status: Single    Spouse name: None  . Number of children: None  . Years of education:  None  . Highest education level: None  Social Needs  . Financial resource strain: None  . Food insecurity - worry: None  . Food insecurity - inability: None  . Transportation needs - medical: None  . Transportation needs - non-medical: None  Occupational History  . None  Tobacco Use  . Smoking status: Current Every Day Smoker    Packs/day: 1.00  . Smokeless tobacco: Current User  Substance and Sexual Activity  . Alcohol use: Yes    Comment:  Daily use -- a 1/5 daily  . Drug use: Yes    Frequency: 7.0 times per week    Types: Barbituates, Benzodiazepines    Comment: Pt denied to author  . Sexual activity: No  Other Topics Concern  . None  Social History Narrative   ** Merged History Encounter **       Additional Social History:   Sleep: Good  Appetite:  Good  Current Medications: Current Facility-Administered Medications  Medication Dose Route Frequency Provider Last Rate Last Dose  . acetaminophen (TYLENOL) tablet 650 mg  650 mg Oral Q6H PRN Rankin, Shuvon B, NP   650 mg at 08/03/17 2220  . alum & mag hydroxide-simeth (MAALOX/MYLANTA) 200-200-20 MG/5ML suspension 30 mL  30 mL Oral Q4H PRN Rankin, Shuvon B, NP      . benztropine (COGENTIN) tablet 0.5 mg  0.5 mg Oral BID Armandina Stammer I, NP   0.5 mg at 08/04/17 1218  . busPIRone (BUSPAR) tablet 15 mg  15 mg Oral TID Armandina Stammer I, NP   15 mg at 08/04/17 1218  . chlordiazePOXIDE (LIBRIUM) capsule 25 mg  25 mg Oral QID PRN Armandina Stammer I, NP   25 mg at 08/04/17 1304  . gabapentin (NEURONTIN) capsule 600 mg  600 mg Oral TID Armandina Stammer I, NP   600 mg at 08/04/17 1217  . haloperidol (HALDOL) tablet 5 mg  5 mg Oral BID Armandina Stammer I, NP   5 mg at 08/04/17 1218  . insulin aspart (novoLOG) injection 0-20 Units  0-20 Units Subcutaneous TID WC Armandina Stammer I, NP   7 Units at 08/04/17 1217  . insulin aspart (novoLOG) injection 8 Units  8 Units Subcutaneous TID WC Rankin, Shuvon B, NP   8 Units at 08/04/17 1216  . insulin glargine  (LANTUS) injection 60 Units  60 Units Subcutaneous BID Rankin, Shuvon B, NP   60 Units at 08/04/17 0840  . lisinopril (PRINIVIL,ZESTRIL) tablet 10 mg  10 mg Oral Daily Rankin, Shuvon B, NP   10 mg at 08/04/17 0836  . magnesium hydroxide (MILK OF MAGNESIA) suspension 30 mL  30 mL Oral Daily PRN Rankin, Shuvon B, NP      . metoCLOPramide (REGLAN) tablet 10 mg  10 mg Oral TID AC & HS Rankin, Shuvon B, NP   10 mg at 08/04/17 1218  . mirtazapine (REMERON) tablet 15 mg  15 mg Oral QHS Jerson Furukawa I, NP      . nicotine (NICODERM CQ - dosed in mg/24 hours) patch 21 mg  21  mg Transdermal Daily Micheal Likens, MD   21 mg at 08/04/17 1610  . pantoprazole (PROTONIX) EC tablet 40 mg  40 mg Oral BID WC Rankin, Shuvon B, NP   40 mg at 08/04/17 0837  . simvastatin (ZOCOR) tablet 20 mg  20 mg Oral q1800 Rankin, Shuvon B, NP   20 mg at 08/03/17 1727   Lab Results:  Results for orders placed or performed during the hospital encounter of 08/02/17 (from the past 48 hour(s))  Glucose, capillary     Status: Abnormal   Collection Time: 08/03/17  5:49 AM  Result Value Ref Range   Glucose-Capillary 393 (H) 65 - 99 mg/dL  Glucose, capillary     Status: Abnormal   Collection Time: 08/03/17 12:15 PM  Result Value Ref Range   Glucose-Capillary 318 (H) 65 - 99 mg/dL  Glucose, capillary     Status: Abnormal   Collection Time: 08/03/17  5:02 PM  Result Value Ref Range   Glucose-Capillary 458 (H) 65 - 99 mg/dL  Glucose, capillary     Status: Abnormal   Collection Time: 08/04/17  5:49 AM  Result Value Ref Range   Glucose-Capillary 156 (H) 65 - 99 mg/dL   Comment 1 Notify RN    Comment 2 Document in Chart   Glucose, capillary     Status: Abnormal   Collection Time: 08/04/17 11:56 AM  Result Value Ref Range   Glucose-Capillary 214 (H) 65 - 99 mg/dL   Blood Alcohol level:  Lab Results  Component Value Date   ETH <10 08/01/2017   ETH <10 07/26/2017   Metabolic Disorder Labs: Lab Results  Component  Value Date   HGBA1C 11.0 (H) 08/21/2015   MPG 269 08/21/2015   MPG 280 08/03/2015   Lab Results  Component Value Date   PROLACTIN 10.7 04/28/2015   Lab Results  Component Value Date   CHOL 248 (H) 08/07/2015   TRIG 195 (H) 08/07/2015   HDL 83 08/07/2015   CHOLHDL 3.0 08/07/2015   VLDL 39 08/07/2015   LDLCALC 126 (H) 08/07/2015   LDLCALC 132 (H) 04/29/2015   Physical Findings: AIMS: Facial and Oral Movements Muscles of Facial Expression: None, normal Lips and Perioral Area: None, normal Jaw: None, normal Tongue: None, normal,Extremity Movements Upper (arms, wrists, hands, fingers): None, normal Lower (legs, knees, ankles, toes): None, normal, Trunk Movements Neck, shoulders, hips: None, normal, Overall Severity Severity of abnormal movements (highest score from questions above): None, normal Incapacitation due to abnormal movements: None, normal Patient's awareness of abnormal movements (rate only patient's report): No Awareness, Dental Status Current problems with teeth and/or dentures?: No Does patient usually wear dentures?: No  CIWA:  CIWA-Ar Total: 7 COWS:     Musculoskeletal: Strength & Muscle Tone: within normal limits Gait & Station: normal Patient leans: N/A  Psychiatric Specialty Exam: Physical Exam  Nursing note and vitals reviewed.   ROS  Blood pressure 113/84, pulse (!) 102, temperature 98.4 F (36.9 C), temperature source Oral, resp. rate 16, height 5\' 2"  (1.575 m), weight 91.2 kg (201 lb).Body mass index is 36.76 kg/m.  General Appearance: Casual  Eye Contact:  Good  Speech:  Clear and Coherent and Normal Rate  Volume:  Normal  Mood:  Anxious, Depressed, Dysphoric, Hopeless and Worthless  Affect:  Non-Congruent and Full Range  Thought Process:  Coherent, Goal Directed and Descriptions of Associations: Intact  Orientation:  Full (Time, Place, and Person)  Thought Content:  Logical and Hallucinations: Auditory  Suicidal Thoughts:  Yes.  with  intent/plan  Homicidal Thoughts:  No  Memory:  Immediate;   Fair Recent;   Fair Remote;   Fair  Judgement:  Poor  Insight:  Shallow  Psychomotor Activity:  Normal  Concentration:  Concentration: Fair and Attention Span: Fair  Recall:  [employer] of Knowledge:  Fair  Language:  Fair  Akathisia:  No  Handed:  Right  AIMS (if indicated):     Assets:  Desire for Improvement  ADL's:  Impaired  Cognition:  WNL  Sleep:  Number of Hours: 6.75     Treatment Plan Summary: Daily contact with patient to assess and evaluate symptoms and progress in treatment: Patient is here for mood stabilization treatments. She says she has been off of her mental health medications for a long time. She continues to endorse suicidal ideations.  - Continue inpatient hospitalization.  - Will continue today 08/04/2017 plan as below except where it is noted.  Benzodiazepine withdrawal.    - Initiate Librium 25 mg po prn Q 6 hours prn.  Agitation/anxiety.    - Continue Buspar 15 mg po tid.    - Continue gabapentin 600 mg po tid.  Mood control.    -  Continue Haldol 5 mg po bid.  EPS.    - Continue Cogentin 0.5 mg po bid.  Depression.    - Continue Mirtazapine 15 mg po Q hs.  DM/HTN.    - Continue Lantus 60 units subQ bid.    - Continue the sliding scale with the Novolog insulin per BS level.    - Continue Novolog insulin 8 units tid with meals.    - Will obtain Hgba1c today.    - Order diabetic consult.    - Continue Lisinopril 10 mg po.    - Continue Reglan 10 mg qid for nausea.  - Encourage group participation. - Discharge disposition ongoing.  Hyperlipidemia.  - Continue Zocor 20 mg po Q hs.  Nicotine withdrawal.   - Continue Nicoderm patch 21 mg Q 24 hours.  Armandina Stammer, NP, PMHNP, FNP-BC. 08/04/2017, 2:32 PM

## 2017-08-04 NOTE — [facility] Group Notes (Signed)
LCSW Group Therapy Note 08/04/2017 2:13 PM  Type of Therapy/Topic: Group Therapy: Balance in Life  Participation Level: None  Description of Group:  This group will address the concept of balance and how it feels and looks when one is unbalanced. Patients will be encouraged to process areas in their lives that are out of balance and identify reasons for remaining unbalanced. Facilitators will guide patients in utilizing problem-solving interventions to address and correct the stressor making their life unbalanced. Understanding and applying boundaries will be explored and addressed for obtaining and maintaining a balanced life. Patients will be encouraged to explore ways to assertively make their unbalanced needs known to significant others in their lives, using other group members and facilitator for support and feedback.  Therapeutic Goals: 1. Patient will identify two or more emotions or situations they have that consume much of in their lives. 2. Patient will identify signs/triggers that life has become out of balance:  3. Patient will identify two ways to set boundaries in order to achieve balance in their lives:  4. Patient will demonstrate ability to communicate their needs through discussion and/or role plays  Summary of Patient Progress:  Cianna attended the group session, however was asleep the entire time. Patient was challenging to engage with due to sleepiness.   Therapeutic Modalities:  Cognitive Behavioral Therapy Solution-Focused Therapy Assertiveness Training   Alcario DroughtJolan Ellaina Schuler LCSWA Clinical Social Worker

## 2017-08-04 NOTE — Tx Team (Signed)
Interdisciplinary Treatment and Diagnostic Plan Update  08/04/2017 Time of Session: 9:30am Carolyn Ayala MRN: [id number]  Principal Diagnosis: Depression, major, recurrent, severe with psychosis (HCC)  Secondary Diagnoses: Principal Problem:   Depression, major, recurrent, severe with psychosis (HCC) Active Problems:   IDDM (insulin dependent diabetes mellitus) (HCC)   Generalized anxiety disorder   Chronic posttraumatic stress disorder   Neuropathic pain   Hyperlipidemia   Alcohol use disorder, moderate, dependence (HCC)   HTN (hypertension)   Suicidal thoughts   Current Medications:  Current Facility-Administered Medications  Medication Dose Route Frequency Provider Last Rate Last Dose  . acetaminophen (TYLENOL) tablet 650 mg  650 mg Oral Q6H PRN Rankin, Shuvon B, NP   650 mg at 08/03/17 2220  . alum & mag hydroxide-simeth (MAALOX/MYLANTA) 200-200-20 MG/5ML suspension 30 mL  30 mL Oral Q4H PRN Rankin, Shuvon B, NP      . gabapentin (NEURONTIN) capsule 300 mg  300 mg Oral TID Rankin, Shuvon B, NP   300 mg at 08/03/17 1728  . haloperidol (HALDOL) tablet 0.5 mg  0.5 mg Oral Q1200 Rankin, Shuvon B, NP   0.5 mg at 08/03/17 1851  . insulin aspart (novoLOG) injection 8 Units  8 Units Subcutaneous TID WC Rankin, Shuvon B, NP   8 Units at 08/04/17 1191  . insulin glargine (LANTUS) injection 60 Units  60 Units Subcutaneous BID Rankin, Shuvon B, NP   60 Units at 08/03/17 1723  . lisinopril (PRINIVIL,ZESTRIL) tablet 10 mg  10 mg Oral Daily Rankin, Shuvon B, NP   10 mg at 08/03/17 0810  . magnesium hydroxide (MILK OF MAGNESIA) suspension 30 mL  30 mL Oral Daily PRN Rankin, Shuvon B, NP      . metoCLOPramide (REGLAN) tablet 10 mg  10 mg Oral TID AC & HS Rankin, Shuvon B, NP   10 mg at 08/04/17 4782  . mirtazapine (REMERON) tablet 45 mg  45 mg Oral QHS Rankin, Shuvon B, NP   45 mg at 08/03/17 2220  . nicotine (NICODERM CQ - dosed in mg/24 hours) patch 21 mg  21 mg Transdermal Daily  Micheal Likens, MD   21 mg at 08/03/17 0811  . pantoprazole (PROTONIX) EC tablet 40 mg  40 mg Oral BID WC Rankin, Shuvon B, NP   40 mg at 08/03/17 1728  . simvastatin (ZOCOR) tablet 20 mg  20 mg Oral q1800 Rankin, Shuvon B, NP   20 mg at 08/03/17 1727   PTA Medications: Medications Prior to Admission  Medication Sig Dispense Refill Last Dose  . acetaminophen (TYLENOL) 500 MG tablet Take 1,000 mg by mouth every 6 (six) hours as needed for mild pain.   08/01/2017 at Unknown time  . ARIPiprazole (ABILIFY) 30 MG tablet Take 30 mg by mouth daily.   07/19/2017  . atorvastatin (LIPITOR) 40 MG tablet Take 40 mg by mouth daily.   07/31/2017  . busPIRone (BUSPAR) 15 MG tablet Take 15 mg by mouth 3 (three) times daily.   08/01/2017 at Unknown time  . cetirizine (ZYRTEC) 10 MG tablet Take 10 mg by mouth daily.   Past Week at Unknown time  . divalproex (DEPAKOTE) 500 MG DR tablet Take 750 mg by mouth 2 (two) times daily.   08/01/2017 at Unknown time  . gabapentin (NEURONTIN) 300 MG capsule Take 1 capsule (300 mg total) by mouth 3 (three) times daily. (Patient not taking: Reported on 07/26/2017) 90 capsule 0 Not Taking at Unknown time  . gabapentin (NEURONTIN) 400 MG  capsule Take 800 mg by mouth 3 (three) times daily.   07/31/2017  . haloperidol (HALDOL) 0.5 MG tablet Take 1 tablet (0.5 mg total) by mouth daily at 12 noon. (Patient taking differently: Take 0.5 mg by mouth 2 (two) times daily. ) 30 tablet 0 Past Week at Unknown time  . hydrOXYzine (ATARAX/VISTARIL) 50 MG tablet Take 0.5 tablets (25 mg total) by mouth every 6 (six) hours as needed for anxiety. (Patient taking differently: Take 50 mg by mouth every 6 (six) hours as needed for anxiety. ) 30 tablet 0 Past Week at Unknown time  . ibuprofen (ADVIL,MOTRIN) 800 MG tablet Take 800 mg by mouth every 8 (eight) hours as needed for moderate pain.   07/31/2017  . insulin aspart (NOVOLOG) 100 UNIT/ML injection Inject 8 Units into the skin 3 (three) times daily  with meals. (Patient taking differently: Inject 0-40 Units into the skin 3 (three) times daily with meals. Sliding scale) 10 mL 3 08/01/2017 at Unknown time  . insulin glargine (LANTUS) 100 UNIT/ML injection Inject 0.6 mLs (60 Units total) into the skin 2 (two) times daily. 10 mL 11 07/31/2017  . lisinopril (PRINIVIL,ZESTRIL) 10 MG tablet Take 1 tablet (10 mg total) by mouth daily. 30 tablet 0 07/30/2017  . methocarbamol (ROBAXIN) 500 MG tablet Take 500 mg by mouth every 6 (six) hours as needed for muscle spasms.   Past Week at Unknown time  . metoCLOPramide (REGLAN) 10 MG tablet Take 1 tablet (10 mg total) by mouth 4 (four) times daily -  before meals and at bedtime. (Patient not taking: Reported on 07/26/2017) 90 tablet 0 Not Taking at Unknown time  . mirtazapine (REMERON) 30 MG tablet Take 30 mg by mouth at bedtime.   Past Week at Unknown time  . mirtazapine (REMERON) 45 MG tablet Take 1 tablet (45 mg total) by mouth at bedtime. (Patient not taking: Reported on 07/26/2017) 30 tablet 0 Not Taking at Unknown time  . Multiple Vitamin (MULTIVITAMIN WITH MINERALS) TABS tablet Take 1 tablet by mouth daily.   07/30/2017  . pantoprazole (PROTONIX) 40 MG tablet Take 1 tablet (40 mg total) by mouth 2 (two) times daily with a meal. (Patient not taking: Reported on 07/26/2017) 30 tablet 0 Not Taking at Unknown time  . simvastatin (ZOCOR) 20 MG tablet Take 1 tablet (20 mg total) by mouth daily at 6 PM. (Patient not taking: Reported on 07/26/2017) 30 tablet 0 Not Taking at Unknown time  . Thiamine Mononitrate (VITAMIN B1 PO) Take 1 tablet by mouth daily.   07/30/2017  . traMADol (ULTRAM) 50 MG tablet Take 50 mg by mouth every 4 (four) hours as needed for moderate pain.   07/30/2017  . VITAMIN E PO Take 2 tablets by mouth daily.   07/30/2017    Patient Stressors: Health problems Loss of mother Medication change or noncompliance Occupational concerns  Patient Strengths: Ability for insight Average or above average  intelligence Capable of independent living General fund of knowledge  Treatment Modalities: Medication Management, Group therapy, Case management,  1 to 1 session with clinician, Psychoeducation, Recreational therapy.   Physician Treatment Plan for Primary Diagnosis: Depression, major, recurrent, severe with psychosis (HCC) Long Term Goal(s): Improvement in symptoms so as ready for discharge Improvement in symptoms so as ready for discharge   Short Term Goals: Ability to disclose and discuss suicidal ideas Ability to identify and develop effective coping behaviors will improve Compliance with prescribed medications will improve Ability to identify triggers associated with substance  abuse/mental health issues will improve Ability to identify changes in lifestyle to reduce recurrence of condition will improve Ability to identify and develop effective coping behaviors will improve Ability to identify triggers associated with substance abuse/mental health issues will improve  Medication Management: Evaluate patient's response, side effects, and tolerance of medication regimen.  Therapeutic Interventions: 1 to 1 sessions, Unit Group sessions and Medication administration.  Evaluation of Outcomes: Progressing.  Physician Treatment Plan for Secondary Diagnosis: Principal Problem:   Depression, major, recurrent, severe with psychosis (HCC) Active Problems:   IDDM (insulin dependent diabetes mellitus) (HCC)   Generalized anxiety disorder   Chronic posttraumatic stress disorder   Neuropathic pain   Hyperlipidemia   Alcohol use disorder, moderate, dependence (HCC)   HTN (hypertension)   Suicidal thoughts  Long Term Goal(s): Improvement in symptoms so as ready for discharge Improvement in symptoms so as ready for discharge   Short Term Goals: Ability to disclose and discuss suicidal ideas Ability to identify and develop effective coping behaviors will improve Compliance with prescribed  medications will improve Ability to identify triggers associated with substance abuse/mental health issues will improve Ability to identify changes in lifestyle to reduce recurrence of condition will improve Ability to identify and develop effective coping behaviors will improve Ability to identify triggers associated with substance abuse/mental health issues will improve     Medication Management: Evaluate patient's response, side effects, and tolerance of medication regimen.  Therapeutic Interventions: 1 to 1 sessions, Unit Group sessions and Medication administration.  Evaluation of Outcomes: Progressing   RN Treatment Plan for Primary Diagnosis: Depression, major, recurrent, severe with psychosis (HCC) Long Term Goal(s): Knowledge of disease and therapeutic regimen to maintain health will improve  Short Term Goals: Ability to remain free from injury will improve, Ability to verbalize frustration and anger appropriately will improve, Ability to demonstrate self-control, Ability to participate in decision making will improve, Ability to verbalize feelings will improve, Ability to disclose and discuss suicidal ideas, Ability to identify and develop effective coping behaviors will improve and Compliance with prescribed medications will improve  Medication Management: RN will administer medications as ordered by provider, will assess and evaluate patient's response and provide education to patient for prescribed medication. RN will report any adverse and/or side effects to prescribing provider.  Therapeutic Interventions: 1 on 1 counseling sessions, Psychoeducation, Medication administration, Evaluate responses to treatment, Monitor vital signs and CBGs as ordered, Perform/monitor CIWA, COWS, AIMS and Fall Risk screenings as ordered, Perform wound care treatments as ordered.  Evaluation of Outcomes: Progressing   LCSW Treatment Plan for Primary Diagnosis: Depression, major, recurrent, severe  with psychosis (HCC) Long Term Goal(s): Safe transition to appropriate next level of care at discharge, Engage patient in therapeutic group addressing interpersonal concerns.  Short Term Goals: Engage patient in aftercare planning with referrals and resources, Increase social support, Increase ability to appropriately verbalize feelings, Increase emotional regulation, Facilitate acceptance of mental health diagnosis and concerns, Facilitate patient progression through stages of change regarding substance use diagnoses and concerns, Identify triggers associated with mental health/substance abuse issues and Increase skills for wellness and recovery  Therapeutic Interventions: Assess for all discharge needs, 1 to 1 time with Social worker, Explore available resources and support systems, Assess for adequacy in community support network, Educate family and significant other(s) on suicide prevention, Complete Psychosocial Assessment, Interpersonal group therapy.  Evaluation of Outcomes: Progressing   Progress in Treatment: Attending groups: Yes. Participating in groups: No. Taking medication as prescribed: Yes. Toleration medication: Yes. Family/Significant  other contact made: No, will contact:  patient refused consent Patient understands diagnosis: Yes. Limited insight  Discussing patient identified problems/goals with staff: Yes. Medical problems stabilized or resolved: Yes. Denies suicidal/homicidal ideation: Yes. Issues/concerns per patient self-inventory: No. Other:   New problem(s) identified: None   New Short Term/Long Term Goal(s): medication stabilization, elimination of SI thoughts, development of comprehensive mental wellness plan.   Patient Goal:  Discharge Plan or Barriers: Discharge to the [facility] and follow up with [facility] and [facility]  Reason for Continuation of Hospitalization: Anxiety Depression Medication stabilization Suicidal  ideation  Estimated Length of Stay: Monday, 08/07/17  Attendees: Patient: Carolyn Ayala 08/04/2017 8:22 AM  Physician: Dr. Nehemiah Massed 08/04/2017 8:22 AM  Nursing: Marton Redwood, RN  08/04/2017 8:22 AM  RN Care Manager: Juliann Pares 08/04/2017 8:22 AM  Social Worker: Baldo Daub, LCSWA 08/04/2017 8:22 AM  Recreational Therapist: Juliann Pares 08/04/2017 8:22 AM  Other:  08/04/2017 8:22 AM  Other:  08/04/2017 8:22 AM  Other: 08/04/2017 8:22 AM    Scribe for Treatment Team: Maeola Sarah, LCSWA 08/04/2017 8:22 AM

## 2017-08-04 NOTE — Progress Notes (Signed)
D: Patient denies SI, HI or AVH tonight. Patient presents as flat but brightens on approach is otherwise calm and cooperative.  Pt. States that she had a good day and is working on a discharge plan but expressed disappointment over being refused by certain programs d/t her diabetes.  Pt. States she is not discouraged and will continue looking, she is aware of her social worker and will continue to work with staff.  Pt. Refused her bedtime CBG stating, "it doesn't get treated anyway".  A: Patient given emotional support from RN. Patient encouraged to come to staff with concerns and/or questions. Patient's medication routine continued. Patient's orders and plan of care reviewed.   R: Patient remains appropriate and cooperative. Will continue to monitor patient q15 minutes for safety.

## 2017-08-04 NOTE — Progress Notes (Signed)
Patient denies SI, HI and AVH.  Patient has attended groups and engaged in unit activities this shift.  Patient has had no incidents of behavioral dyscontrol.   Assess patient for safety, offer medications as prescribed, engage patient in 1:1 staff talks.   Patient able to contract for safety. Continue to monitor as planned.   

## 2017-08-04 NOTE — Progress Notes (Signed)
Recreation Therapy Notes  Date: 08/04/17 Time: 0930 Location: 300 Hall Dayroom  Group Topic: Stress Management  Goal Area(s) Addresses:  Patient will verbalize importance of using healthy stress management.  Patient will identify positive emotions associated with healthy stress management.   Intervention: Stress Management  Activity :  Meditation.  LRT introduced the stress management technique of meditation.  LRT played Ayala meditation from the Calm app the focused on gratitude.  Patients were to listen and follow along with the meditation as it played.  Education:  Stress Management, Discharge Planning.   Education Outcome: Acknowledges edcuation/In group clarification offered/Needs additional education  Clinical Observations/Feedback: Pt did not attend group.    Carolyn Ayala, LRT/CTRS         Carolyn Ayala 08/04/2017 11:31 AM 

## 2017-08-05 DIAGNOSIS — F39 Unspecified mood [affective] disorder: Secondary | ICD-10-CM

## 2017-08-05 LAB — GLUCOSE, CAPILLARY
Glucose-Capillary: 333 mg/dL — ABNORMAL HIGH (ref 65–99)
Glucose-Capillary: 79 mg/dL (ref 65–99)
Glucose-Capillary: 484 mg/dL — ABNORMAL HIGH (ref 65–99)

## 2017-08-05 LAB — HEMOGLOBIN A1C
Hgb A1c MFr Bld: 11.6 % — ABNORMAL HIGH (ref 4.8–5.6)
Mean Plasma Glucose: 286 mg/dL

## 2017-08-05 MED ORDER — IBUPROFEN 800 MG PO TABS
800.0000 mg | ORAL_TABLET | Freq: Three times a day (TID) | ORAL | Status: DC | PRN
  Administered 2017-08-05 – 2017-08-06 (×4): 800 mg via ORAL
  Filled 2017-08-05 (×4): qty 1

## 2017-08-05 MED ORDER — TRAZODONE HCL 100 MG PO TABS
100.0000 mg | ORAL_TABLET | Freq: Every evening | ORAL | Status: DC | PRN
  Filled 2017-08-05: qty 7

## 2017-08-05 MED ORDER — INSULIN GLARGINE 100 UNIT/ML ~~LOC~~ SOLN
70.0000 [IU] | Freq: Two times a day (BID) | SUBCUTANEOUS | Status: DC
  Administered 2017-08-05 – 2017-08-06 (×3): 70 [IU] via SUBCUTANEOUS

## 2017-08-05 MED ORDER — ARIPIPRAZOLE 10 MG PO TABS
10.0000 mg | ORAL_TABLET | Freq: Every day | ORAL | Status: DC
  Administered 2017-08-05 – 2017-08-07 (×3): 10 mg via ORAL
  Filled 2017-08-05 (×5): qty 1

## 2017-08-05 NOTE — Progress Notes (Addendum)
Inpatient Diabetes Program Recommendations  AACE/ADA: New Consensus Statement on Inpatient Glycemic Control (2015)  Target Ranges:  Prepandial:   less than 140 mg/dL      Peak postprandial:   less than 180 mg/dL (1-2 hours)      Critically ill patients:  140 - 180 mg/dL   Results for Carolyn GanongCARDENAS, [name] (MRN [id number]) as of 08/05/2017 09:28  Ref. Range 08/04/2017 05:49 08/04/2017 11:56 08/04/2017 20:18 08/05/2017 06:15  Glucose-Capillary Latest Ref Range: 65 - 99 mg/dL 914156 (H) 782214 (H) 956223 (H) 333 (H)   Review of Glycemic Control  Diabetes history: DM 2  Outpatient Diabetes medications:  Lantus 60 units BID Novolog 0-40 units tid with meals  Current orders for Inpatient glycemic control:  Lantus 60 units BID Novolog Resistant Correction 0-20 units tid  Novolog 8 units tid meal coverage  Inpatient Diabetes Program Recommendations:    Glucose trends mostly elevated.  Consider increasing Lantus to 62 units BID.  Consider increasing Novolog Meal coverage to 10 units tid.  Thanks,  Christena DeemShannon Rishab Stoudt RN, MSN, BC-ADM, [facility] Inpatient Diabetes Coordinator Team Pager [phone] (8a-5p)

## 2017-08-05 NOTE — Progress Notes (Signed)
D: Patient continues to report thoughts of self harm without a specific plan.  She has a fixed smile and is pleasant with staff.  She rates her depression, hopelessness and anxiety as a 10.  She reports withdrawal symptoms as tremors, diarrhea, cravings, agitation, runny nose, sedation, chilling, cramping, nausea and irritability.  Her CBGs have been running on the high side and she supposedly has a diabetic consult in place.  She plans to work on her "coping skills" today.  Patient requested to see a friend this morning before they return to [place].  She was granted a 10 minute visit around 1000 am.  She was appreciative of staff helping her.  [facility]  Patient does not appear to be responding to internal stimuli. Patient can be irritable at times when she does not get her way.  Patient is not an active participant in group activities.  A: Continue to monitor medication management and MD orders.  Safety checks continued every 15 minutes per protocol.  Offer support and encouragement as needed.  R: Patient is receptive to staff; her behavior is appropriate.

## 2017-08-05 NOTE — Progress Notes (Signed)
D   Pt spent most of the shift in bed resting   She was pleasant on approach and cooperative     A   Verbal support given   Medications administered and effectiveness monitored    Q 15 min checks  R   Pt safe at present time

## 2017-08-05 NOTE — [facility] Group Notes (Signed)
LCSW Group Therapy Note  08/05/2017 9:30-10:30AM - 300 Hall, 10:30-11:30 - 400 [address], 11:30-12:00 - 500 Hall  Type of Therapy and Topic:  Group Therapy: Anger Cues and Responses  Participation Level:  Minimal   Description of Group:   In this group, patients learned how to recognize the physical, cognitive, emotional, and behavioral responses they have to anger-provoking situations.  They identified a recent time they became angry and how they reacted.  They analyzed how their reaction was possibly beneficial and how it was possibly unhelpful.  The group discussed a variety of healthier coping skills that could help with such a situation in the future.  Deep breathing was practiced briefly.  Therapeutic Goals: 1. Patients will remember their last incident of anger and how they felt emotionally and physically, what their thoughts were at the time, and how they behaved. 2. Patients will identify how their behavior at that time worked for them, as well as how it worked against them. 3. Patients will explore possible new behaviors to use in future anger situations. 4. Patients will learn that anger itself is normal and cannot be eliminated, and that healthier reactions can assist with resolving conflict rather than worsening situations.  Summary of Patient Progress:  The patient shared that their most recent time of anger was 2 weeks ago due to a relationship issue and then she slept through the remainder of group when present, was called out to see a provider twice.  Therapeutic Modalities:   Cognitive Behavioral Therapy  Lynnell ChadMareida J Grossman-Orr  08/05/2017 8:24 AM

## 2017-08-05 NOTE — Progress Notes (Addendum)
Patient's CBG was 484.  Notified Feliz Beamravis, NP of reading.  Patient given 20 units novolog plus 8 units meal coverage.  Diabetic consult pending due inconsistent CBGs.

## 2017-08-05 NOTE — Progress Notes (Signed)
[facility]  08/05/2017 11:24 AM Carolyn Ayala  MRN:  [id number]   Subjective:  Patient reports that she slept well last night and has a good appetite, but she still feels very depressed and has passive SI. She denies any HI/VH, but [facility] to harm herself. She states she will be safe while she is here. She also states that she does not like the Haldol and that Abilify has worked for her in the past at 30 mg Daily.   Objective: Patient's chart and findings reviewed and discussed with treatment team. She presents in the day room interacting appropriately and has been attending groups. She is pleasant and cooperative and has been compliant with treatment thus far. Will stop the Haldol and start Abilify at 10 mg and titratate as needed.   Principal Problem: Depression, major, recurrent, severe with psychosis (HCC) Diagnosis:   Patient Active Problem List   Diagnosis Date Noted  . Suicidal thoughts [R45.851]   . Hypokalemia [E87.6]   . HTN (hypertension) [I10] 09/24/2015  . Hyperlipidemia [E78.5] 08/10/2015  . Alcohol use disorder, moderate, dependence (HCC) [F10.20] 08/10/2015  . Neuropathic pain [M79.2] 08/08/2015  . 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 Generalized anxiety disorder [F41.1] 02/13/2013  . Chronic posttraumatic stress disorder [F43.12] 02/13/2013  . IDDM (insulin dependent diabetes mellitus) (HCC) [E11.9, Z79.4] 02/08/2013   Total Time spent with patient: 30 minutes  Past Psychiatric History: See H&P  Past Medical History:  Past Medical History:  Diagnosis Date  . Depression   . Diabetes mellitus without complication (HCC)   . Hypertension   . Mental disorder   . Noncompliance     Past Surgical History:  Procedure Laterality Date  . CESAREAN SECTION     Family History:  Family History  Problem Relation Age of Onset  . Depression Mother    Family Psychiatric  History: See  H&P Social History:  Social History   Substance and Sexual Activity  Alcohol Use Yes   Comment:  Daily use -- a 1/5 daily     Social History   Substance and Sexual Activity  Drug Use Yes  . Frequency: 7.0 times per week  . Types: Barbituates, Benzodiazepines   Comment: Pt denied to Chartered loss adjusterauthor    Social History   Socioeconomic History  . Marital status: Single    Spouse name: None  . Number of children: None  . Years of education: None  . Highest education level: None  Social Needs  . Financial resource strain: None  . Food insecurity - worry: None  . Food insecurity - inability: None  . Transportation needs - medical: None  . Transportation needs - non-medical: None  Occupational History  . None  Tobacco Use  . Smoking status: Current Every Day Smoker    Packs/day: 1.00  . Smokeless tobacco: Current User  Substance and Sexual Activity  . Alcohol use: Yes    Comment:  Daily use -- a 1/5 daily  . Drug use: Yes    Frequency: 7.0 times per week    Types: Barbituates, Benzodiazepines    Comment: Pt denied to author  . Sexual activity: No  Other Topics Concern  . None  Social History Narrative   ** Merged History Encounter **       Additional Social History:  Sleep: Good  Appetite:  Good  Current Medications: Current Facility-Administered Medications  Medication Dose Route Frequency Provider Last Rate Last Dose  . alum & mag hydroxide-simeth (MAALOX/MYLANTA) 200-200-20 MG/5ML suspension 30 mL  30 mL Oral Q4H PRN Rankin, Shuvon B, NP      . ARIPiprazole (ABILIFY) tablet 10 mg  10 mg Oral Daily Lasean Gorniak, Gerlene Burdock, FNP   10 mg at 08/05/17 0946  . benztropine (COGENTIN) tablet 0.5 mg  0.5 mg Oral BID Armandina Stammer I, NP   0.5 mg at 08/05/17 0748  . busPIRone (BUSPAR) tablet 15 mg  15 mg Oral TID Armandina Stammer I, NP   15 mg at 08/05/17 0748  . chlordiazePOXIDE (LIBRIUM) capsule 25 mg  25 mg Oral QID PRN Armandina Stammer I, NP   25 mg at  08/05/17 0751  . gabapentin (NEURONTIN) capsule 600 mg  600 mg Oral TID Armandina Stammer I, NP   600 mg at 08/05/17 0748  . ibuprofen (ADVIL,MOTRIN) tablet 800 mg  800 mg Oral Q8H PRN Lazlo Tunney, Gerlene Burdock, FNP      . insulin aspart (novoLOG) injection 0-20 Units  0-20 Units Subcutaneous TID WC Armandina Stammer I, NP   15 Units at 08/05/17 [phone]  . insulin aspart (novoLOG) injection 8 Units  8 Units Subcutaneous TID WC Rankin, Shuvon B, NP   8 Units at 08/05/17 0629  . insulin glargine (LANTUS) injection 60 Units  60 Units Subcutaneous BID Rankin, Shuvon B, NP   60 Units at 08/05/17 0752  . lisinopril (PRINIVIL,ZESTRIL) tablet 10 mg  10 mg Oral Daily Rankin, Shuvon B, NP   10 mg at 08/05/17 0748  . magnesium hydroxide (MILK OF MAGNESIA) suspension 30 mL  30 mL Oral Daily PRN Rankin, Shuvon B, NP      . metoCLOPramide (REGLAN) tablet 10 mg  10 mg Oral TID AC & HS Rankin, Shuvon B, NP   10 mg at 08/05/17 0627  . mirtazapine (REMERON) tablet 15 mg  15 mg Oral QHS Nwoko, Agnes I, NP   15 mg at 08/04/17 2224  . nicotine (NICODERM CQ - dosed in mg/24 hours) patch 21 mg  21 mg Transdermal Daily Micheal Likens, MD   21 mg at 08/05/17 0748  . pantoprazole (PROTONIX) EC tablet 40 mg  40 mg Oral BID WC Rankin, Shuvon B, NP   40 mg at 08/05/17 0748  . simvastatin (ZOCOR) tablet 20 mg  20 mg Oral q1800 Rankin, Shuvon B, NP   20 mg at 08/04/17 1815  . traZODone (DESYREL) tablet 100 mg  100 mg Oral QHS PRN Gervase Colberg, Gerlene Burdock, FNP        Lab Results:  Results for orders placed or performed during the hospital encounter of 08/02/17 (from the past 48 hour(s))  Glucose, capillary     Status: Abnormal   Collection Time: 08/03/17 12:15 PM  Result Value Ref Range   Glucose-Capillary 318 (H) 65 - 99 mg/dL  Glucose, capillary     Status: Abnormal   Collection Time: 08/03/17  5:02 PM  Result Value Ref Range   Glucose-Capillary 458 (H) 65 - 99 mg/dL  Glucose, capillary     Status: Abnormal   Collection Time: 08/04/17  5:49  AM  Result Value Ref Range   Glucose-Capillary 156 (H) 65 - 99 mg/dL   Comment 1 Notify RN    Comment 2 Document in Chart   Glucose, capillary     Status: Abnormal   Collection Time: 08/04/17 11:56  AM  Result Value Ref Range   Glucose-Capillary 214 (H) 65 - 99 mg/dL  Hemoglobin W0J     Status: Abnormal   Collection Time: 08/04/17  6:22 PM  Result Value Ref Range   Hgb A1c MFr Bld 11.6 (H) 4.8 - 5.6 %    Comment: (NOTE)         Prediabetes: 5.7 - 6.4         Diabetes: >6.4         Glycemic control for adults with diabetes: <7.0    Mean Plasma Glucose 286 mg/dL    Comment: (NOTE) Performed At: [facility] [address] [place], [place] [id number] Jolene Schimke MD [id number] Performed at [facility], [address] [place], [place] [address]   Glucose, capillary     Status: Abnormal   Collection Time: 08/04/17  8:18 PM  Result Value Ref Range   Glucose-Capillary 223 (H) 65 - 99 mg/dL  Glucose, capillary     Status: Abnormal   Collection Time: 08/05/17  6:15 AM  Result Value Ref Range   Glucose-Capillary 333 (H) 65 - 99 mg/dL    Blood Alcohol level:  Lab Results  Component Value Date   ETH <10 08/01/2017   ETH <10 07/26/2017    Metabolic Disorder Labs: Lab Results  Component Value Date   HGBA1C 11.6 (H) 08/04/2017   MPG 286 08/04/2017   MPG 269 08/21/2015   Lab Results  Component Value Date   PROLACTIN 10.7 04/28/2015   Lab Results  Component Value Date   CHOL 248 (H) 08/07/2015   TRIG 195 (H) 08/07/2015   HDL 83 08/07/2015   CHOLHDL 3.0 08/07/2015   VLDL 39 08/07/2015   LDLCALC 126 (H) 08/07/2015   LDLCALC 132 (H) 04/29/2015    Physical Findings: AIMS: Facial and Oral Movements Muscles of Facial Expression: None, normal Lips and Perioral Area: None, normal Jaw: None, normal Tongue: None, normal,Extremity Movements Upper (arms, wrists, hands, fingers): None, normal Lower (legs, knees, ankles, toes): None, normal,  Trunk Movements Neck, shoulders, hips: None, normal, Overall Severity Severity of abnormal movements (highest score from questions above): None, normal Incapacitation due to abnormal movements: None, normal Patient's awareness of abnormal movements (rate only patient's report): No Awareness, Dental Status Current problems with teeth and/or dentures?: No Does patient usually wear dentures?: No  CIWA:  CIWA-Ar Total: 7 COWS:     Musculoskeletal: Strength & Muscle Tone: within normal limits Gait & Station: normal Patient leans: N/A  Psychiatric Specialty Exam: Physical Exam  Nursing note and vitals reviewed. Constitutional: She is oriented to person, place, and time. She appears well-developed and well-nourished.  Cardiovascular: Normal rate.  Respiratory: Effort normal.  Musculoskeletal: Normal range of motion.  Neurological: She is alert and oriented to person, place, and time.  Skin: Skin is warm.    Review of Systems  Constitutional: Negative.   HENT: Negative.   Eyes: Negative.   Respiratory: Negative.   Cardiovascular: Negative.   Gastrointestinal: Negative.   Genitourinary: Negative.   Musculoskeletal: Negative.   Skin: Negative.   Neurological: Negative.   Endo/Heme/Allergies: Negative.   Psychiatric/Behavioral: Positive for depression, hallucinations and suicidal ideas. The patient is nervous/anxious.     Blood pressure 104/73, pulse 94, temperature 98.5 F (36.9 C), temperature source Oral, resp. rate 16, height 5\' 2"  (1.575 m), weight 91.2 kg (201 lb).Body mass index is 36.76 kg/m.  General Appearance: Casual  Eye Contact:  Good  Speech:  Clear and Coherent and  Normal Rate  Volume:  Normal  Mood:  Depressed  Affect:  Flat  Thought Process:  Goal Directed and Descriptions of Associations: Intact  Orientation:  Full (Time, Place, and Person)  Thought Content:  Hallucinations: Auditory  Suicidal Thoughts:  Yes.  without intent/plan  Homicidal Thoughts:  No   Memory:  Immediate;   Good Recent;   Good Remote;   Good  Judgement:  Fair  Insight:  Good  Psychomotor Activity:  Normal  Concentration:  Concentration: Good and Attention Span: Good  Recall:  Good  Fund of Knowledge:  Good  Language:  Good  Akathisia:  No  Handed:  Right  AIMS (if indicated):     Assets:  Communication Skills Desire for Improvement Financial Resources/Insurance Social Support  ADL's:  Intact  Cognition:  WNL  Sleep:  Number of Hours: 5   Problems Addressed: MDD severe with psychosis GAD  Treatment Plan Summary: Daily contact with patient to assess and evaluate symptoms and progress in treatment, Medication management and Plan is to:  -Stop Haldol -Start Abilify 10 mg PO Daily for mood stability -Continue Gabapentin 600 mg PO TID for withdrawal and pain -Continue Remeron 15 mg PO QHS for mood stability -Continue Buspar 15 mg PO TID for anxiety -Continue Librium 25 mg QID PRN for CIWA >10 -Continue Cogentin 0.5 mg PO BID for EPS -Encourage group therapy participation  Maryfrances Bunnell, FNP 08/05/2017, 11:24 AM

## 2017-08-05 NOTE — Progress Notes (Signed)
Pt refused CBG this evening   She is not due any insulin and does not demonstrate any symptoms of low blood sugar at this time   Will continue to monitor for signs and symptoms of Hypo/Hyper glycemia

## 2017-08-05 NOTE — Progress Notes (Signed)
D   Pt asked for her medications early  Said she was going to bed but then decided to go to AA   She was pleasant on approach and cooperative     A   Verbal support given   Medications administered and effectiveness monitored    Q 15 min checks   Spoke with her again about her blood sugar   Pt said she takes that much insulin at home so she is certain she will not bottom out but did report she knows signs and symptoms of Hypo/Hyer glycemia and assured she would inform staff if experiencing them   R   Pt safe at present time

## 2017-08-06 LAB — GLUCOSE, CAPILLARY
Glucose-Capillary: 289 mg/dL — ABNORMAL HIGH (ref 65–99)
Glucose-Capillary: 189 mg/dL — ABNORMAL HIGH (ref 65–99)
Glucose-Capillary: 437 mg/dL — ABNORMAL HIGH (ref 65–99)
Glucose-Capillary: 176 mg/dL — ABNORMAL HIGH (ref 65–99)

## 2017-08-06 MED ORDER — INSULIN GLARGINE 100 UNIT/ML ~~LOC~~ SOLN
70.0000 [IU] | Freq: Two times a day (BID) | SUBCUTANEOUS | Status: DC
  Administered 2017-08-07: 70 [IU] via SUBCUTANEOUS
  Filled 2017-08-06: qty 0.7

## 2017-08-06 MED ORDER — INSULIN ASPART 100 UNIT/ML ~~LOC~~ SOLN: 15.0000 [IU] | Freq: Three times a day (TID) | SUBCUTANEOUS | Status: DC

## 2017-08-06 MED ORDER — INSULIN GLARGINE 100 UNIT/ML ~~LOC~~ SOLN: 74.0000 [IU] | Freq: Two times a day (BID) | SUBCUTANEOUS | Status: DC

## 2017-08-06 MED ORDER — INSULIN ASPART 100 UNIT/ML ~~LOC~~ SOLN
12.0000 [IU] | Freq: Three times a day (TID) | SUBCUTANEOUS | Status: DC
  Administered 2017-08-06 (×2): 12 [IU] via SUBCUTANEOUS

## 2017-08-06 MED ORDER — INSULIN ASPART 100 UNIT/ML ~~LOC~~ SOLN
12.0000 [IU] | Freq: Three times a day (TID) | SUBCUTANEOUS | Status: DC
  Administered 2017-08-07 (×2): 12 [IU] via SUBCUTANEOUS
  Filled 2017-08-06: qty 0.12

## 2017-08-06 NOTE — Progress Notes (Addendum)
[facility]  08/06/2017 11:25 AM Carolyn Ayala  MRN:  [id number]   Subjective:  Patient states that she is feeling much better and is wanting to know when she can be discharged. She reports that she had some disrupted sleep last night but was able to get back to sleep and feels that she slept good. She denies any SI/HI/AVH and contracts for safety. She rates her depression at 4/10 and her anxiety at 5/10. She also reports that her insulin orders are wrong. She has been taking Lantus 70 units BID and 15 units TID with meals and sliding scale coverage.   Objective: Patient's chart and findings reviewed and discussed with treatment team. Patient is in the day room interacting with peers and staff appropriately. She is seen laughing and talking to staff and peers as well. After reviewing her labs will increase Novolog to 12 units TID with meals and increased the Lantus to 70 units BID. We are still waiting on the Diabetic consult. Patient will hopefully discharge tomorrow.   Principal Problem: Depression, major, recurrent, severe with psychosis (HCC) Diagnosis:   Patient Active Problem List   Diagnosis Date Noted  . Suicidal thoughts [R45.851]   . Hypokalemia [E87.6]   . HTN (hypertension) [I10] 09/24/2015  . Hyperlipidemia [E78.5] 08/10/2015  . Alcohol use disorder, moderate, dependence (HCC) [F10.20] 08/10/2015  . Neuropathic pain [M79.2] 08/08/2015  . 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 Generalized anxiety disorder [F41.1] 02/13/2013  . Chronic posttraumatic stress disorder [F43.12] 02/13/2013  . IDDM (insulin dependent diabetes mellitus) (HCC) [E11.9, Z79.4] 02/08/2013   Total Time spent with patient: 30 minutes  Past Psychiatric History: See H&P  Past Medical History:  Past Medical History:  Diagnosis Date  . Depression   . Diabetes mellitus without complication (HCC)   . Hypertension   . Mental disorder   .  Noncompliance     Past Surgical History:  Procedure Laterality Date  . CESAREAN SECTION     Family History:  Family History  Problem Relation Age of Onset  . Depression Mother    Family Psychiatric  History: See H&P Social History:  Social History   Substance and Sexual Activity  Alcohol Use Yes   Comment:  Daily use -- a 1/5 daily     Social History   Substance and Sexual Activity  Drug Use Yes  . Frequency: 7.0 times per week  . Types: Barbituates, Benzodiazepines   Comment: Pt denied to Chartered loss adjusterauthor    Social History   Socioeconomic History  . Marital status: Single    Spouse name: None  . Number of children: None  . Years of education: None  . Highest education level: None  Social Needs  . Financial resource strain: None  . Food insecurity - worry: None  . Food insecurity - inability: None  . Transportation needs - medical: None  . Transportation needs - non-medical: None  Occupational History  . None  Tobacco Use  . Smoking status: Current Every Day Smoker    Packs/day: 1.00  . Smokeless tobacco: Current User  Substance and Sexual Activity  . Alcohol use: Yes    Comment:  Daily use -- a 1/5 daily  . Drug use: Yes    Frequency: 7.0 times per week    Types: Barbituates, Benzodiazepines    Comment: Pt denied to author  . Sexual activity: No  Other Topics Concern  . None  Social History Narrative   **  Merged History Encounter **       Additional Social History:        Sleep: Good- disrupted some but able to go back to sleep  Appetite:  Good  Current Medications: Current Facility-Administered Medications  Medication Dose Route Frequency Provider Last Rate Last Dose  . alum & mag hydroxide-simeth (MAALOX/MYLANTA) 200-200-20 MG/5ML suspension 30 mL  30 mL Oral Q4H PRN Rankin, Shuvon B, NP      . ARIPiprazole (ABILIFY) tablet 10 mg  10 mg Oral Daily Money, Gerlene Burdock, FNP   10 mg at 08/06/17 0804  . benztropine (COGENTIN) tablet 0.5 mg  0.5 mg Oral BID  Armandina Stammer I, NP   0.5 mg at 08/06/17 0804  . busPIRone (BUSPAR) tablet 15 mg  15 mg Oral TID Armandina Stammer I, NP   15 mg at 08/06/17 0804  . chlordiazePOXIDE (LIBRIUM) capsule 25 mg  25 mg Oral QID PRN Armandina Stammer I, NP   25 mg at 08/06/17 0804  . gabapentin (NEURONTIN) capsule 600 mg  600 mg Oral TID Armandina Stammer I, NP   600 mg at 08/06/17 0804  . ibuprofen (ADVIL,MOTRIN) tablet 800 mg  800 mg Oral Q8H PRN Money, Gerlene Burdock, FNP   800 mg at 08/06/17 0353  . insulin aspart (novoLOG) injection 0-20 Units  0-20 Units Subcutaneous TID WC Armandina Stammer I, NP   11 Units at 08/06/17 0630  . insulin aspart (novoLOG) injection 12 Units  12 Units Subcutaneous TID WC Money, Gerlene Burdock, FNP      . insulin glargine (LANTUS) injection 70 Units  70 Units Subcutaneous BID Maryfrances Bunnell, [place]   70 Units at 08/06/17 1610  . lisinopril (PRINIVIL,ZESTRIL) tablet 10 mg  10 mg Oral Daily Rankin, Shuvon B, NP   10 mg at 08/06/17 0804  . magnesium hydroxide (MILK OF MAGNESIA) suspension 30 mL  30 mL Oral Daily PRN Rankin, Shuvon B, NP      . metoCLOPramide (REGLAN) tablet 10 mg  10 mg Oral TID AC & HS Rankin, Shuvon B, NP   10 mg at 08/06/17 0627  . mirtazapine (REMERON) tablet 15 mg  15 mg Oral QHS Armandina Stammer I, NP   15 mg at 08/05/17 2012  . nicotine (NICODERM CQ - dosed in mg/24 hours) patch 21 mg  21 mg Transdermal Daily Micheal Likens, MD   21 mg at 08/06/17 0805  . pantoprazole (PROTONIX) EC tablet 40 mg  40 mg Oral BID WC Rankin, Shuvon B, NP   40 mg at 08/06/17 0804  . simvastatin (ZOCOR) tablet 20 mg  20 mg Oral q1800 Rankin, Shuvon B, NP   20 mg at 08/05/17 1713  . traZODone (DESYREL) tablet 100 mg  100 mg Oral QHS PRN Money, Gerlene Burdock, FNP        Lab Results:  Results for orders placed or performed during the hospital encounter of 08/02/17 (from the past 48 hour(s))  Glucose, capillary     Status: Abnormal   Collection Time: 08/04/17 11:56 AM  Result Value Ref Range   Glucose-Capillary 214 (H)  65 - 99 mg/dL  Hemoglobin R6E     Status: Abnormal   Collection Time: 08/04/17  6:22 PM  Result Value Ref Range   Hgb A1c MFr Bld 11.6 (H) 4.8 - 5.6 %    Comment: (NOTE)         Prediabetes: 5.7 - 6.4         Diabetes: >6.4  Glycemic control for adults with diabetes: <7.0    Mean Plasma Glucose 286 mg/dL    Comment: (NOTE) Performed At: [facility] [address] [place], [place] [id number] Jolene Schimke MD [id number] Performed at [facility], [address] [place], [place] [address]   Glucose, capillary     Status: Abnormal   Collection Time: 08/04/17  8:18 PM  Result Value Ref Range   Glucose-Capillary 223 (H) 65 - 99 mg/dL  Glucose, capillary     Status: Abnormal   Collection Time: 08/05/17  6:15 AM  Result Value Ref Range   Glucose-Capillary 333 (H) 65 - 99 mg/dL  Glucose, capillary     Status: None   Collection Time: 08/05/17 11:56 AM  Result Value Ref Range   Glucose-Capillary 79 65 - 99 mg/dL  Glucose, capillary     Status: Abnormal   Collection Time: 08/05/17  5:00 PM  Result Value Ref Range   Glucose-Capillary 484 (H) 65 - 99 mg/dL  Glucose, capillary     Status: Abnormal   Collection Time: 08/06/17  5:57 AM  Result Value Ref Range   Glucose-Capillary 289 (H) 65 - 99 mg/dL    Blood Alcohol level:  Lab Results  Component Value Date   ETH <10 08/01/2017   ETH <10 07/26/2017    Metabolic Disorder Labs: Lab Results  Component Value Date   HGBA1C 11.6 (H) 08/04/2017   MPG 286 08/04/2017   MPG 269 08/21/2015   Lab Results  Component Value Date   PROLACTIN 10.7 04/28/2015   Lab Results  Component Value Date   CHOL 248 (H) 08/07/2015   TRIG 195 (H) 08/07/2015   HDL 83 08/07/2015   CHOLHDL 3.0 08/07/2015   VLDL 39 08/07/2015   LDLCALC 126 (H) 08/07/2015   LDLCALC 132 (H) 04/29/2015    Physical Findings: AIMS: Facial and Oral Movements Muscles of Facial Expression: None, normal Lips and Perioral Area:  None, normal Jaw: None, normal Tongue: None, normal,Extremity Movements Upper (arms, wrists, hands, fingers): None, normal Lower (legs, knees, ankles, toes): None, normal, Trunk Movements Neck, shoulders, hips: None, normal, Overall Severity Severity of abnormal movements (highest score from questions above): None, normal Incapacitation due to abnormal movements: None, normal Patient's awareness of abnormal movements (rate only patient's report): No Awareness, Dental Status Current problems with teeth and/or dentures?: No Does patient usually wear dentures?: No  CIWA:  CIWA-Ar Total: 7 COWS:     Musculoskeletal: Strength & Muscle Tone: within normal limits Gait & Station: normal Patient leans: N/A  Psychiatric Specialty Exam: Physical Exam  Nursing note and vitals reviewed. Constitutional: She is oriented to person, place, and time. She appears well-developed and well-nourished.  Cardiovascular: Normal rate.  Respiratory: Effort normal.  Musculoskeletal: Normal range of motion.  Neurological: She is alert and oriented to person, place, and time.  Skin: Skin is warm.    Review of Systems  Constitutional: Negative.   HENT: Negative.   Eyes: Negative.   Respiratory: Negative.   Cardiovascular: Negative.   Gastrointestinal: Negative.   Genitourinary: Negative.   Musculoskeletal: Negative.   Skin: Negative.   Neurological: Negative.   Endo/Heme/Allergies: Negative.   Psychiatric/Behavioral: Positive for depression. Negative for hallucinations and suicidal ideas. The patient is nervous/anxious.     Blood pressure 131/81, pulse 91, temperature 98.4 F (36.9 C), resp. rate 16, height 5\' 2"  (1.575 m), weight 91.2 kg (201 lb).Body mass index is 36.76 kg/m.  General Appearance: Casual  Eye Contact:  Good  Speech:  Clear and Coherent and Normal Rate  Volume:  Normal  Mood:  Euthymic  Affect:  Congruent  Thought Process:  Goal Directed and Descriptions of Associations: Intact   Orientation:  Full (Time, Place, and Person)  Thought Content:  WDL  Suicidal Thoughts:  No  Homicidal Thoughts:  No  Memory:  Immediate;   Good Recent;   Good Remote;   Good  Judgement:  Fair  Insight:  Good  Psychomotor Activity:  Normal  Concentration:  Concentration: Good and Attention Span: Good  Recall:  Good  Fund of Knowledge:  Good  Language:  Good  Akathisia:  No  Handed:  Right  AIMS (if indicated):     Assets:  Communication Skills Desire for Improvement Financial Resources/Insurance Social Support  ADL's:  Intact  Cognition:  WNL  Sleep:  Number of Hours: 5   Problems Addressed: MDD severe with psychosis GAD DM II  Treatment Plan Summary: Daily contact with patient to assess and evaluate symptoms and progress in treatment, Medication management and Plan is to:  -Increased Lantus to 70 units BID for DM II  -Increased Novolog 12 units TID with meals -Continue Abilify 10 mg PO Daily for mood stability -Continue Gabapentin 600 mg PO TID for withdrawal and pain -Continue Remeron 15 mg PO QHS for mood stability -Continue Buspar 15 mg PO TID for anxiety -Continue Librium 25 mg QID PRN for CIWA >10 -Continue Cogentin 0.5 mg PO BID for EPS -Encourage group therapy participation  Maryfrances Bunnell, FNP 08/06/2017, 11:25 AM  Have reviewed NP Progress Note I have reviewed  with Hospitalist ( Dr. Barnie Del) for assistance with diabetic management as patient remains hyperglycemic ( 437 at 4,30 PM) .   Dr. Rito Ehrlich agrees with  Lantus Insulin titration to 70 units BID and Novolog meal coverage  titration to 12 units TID with meals,   and to consider further Novolog meal coverage increase to 15 units TID with meals if needed    Staff has also  reviewed importance of following appropriate diet with patient. Sallyanne Havers MD

## 2017-08-06 NOTE — Progress Notes (Signed)
DAR NOTE: Patient presents with anxious affect and depressed mood. Pt has been observed in the dayroom interacting with peers. Pt stated she slept fairly last night has fair appetite, low energy and poor concentration. Pt was happy with blood sugar at lunch time (189 mg/dl) and stated she will be careful about her diet. Denies pain, auditory and visual hallucinations.  Rates depression at 10, hopelessness at 10, and anxiety at 10.  Maintained on routine safety checks.  Medications given as prescribed.  Support and encouragement offered as needed.  Attended group and participated.  States goal for today is "coping skills, discharge planning."  Patient observed socializing with peers in the dayroom.  Offered no complaint.

## 2017-08-06 NOTE — Progress Notes (Signed)
Patient did not attend the evening speaker AA meeting. Pt was sleeping during group time.   

## 2017-08-06 NOTE — [facility] Group Notes (Signed)
[facility] LCSW Group Therapy Note  Date/Time:  08/06/2017  11:00AM-12:00PM  Type of Therapy and Topic:  Group Therapy:  Music and Mood  Participation Level:  Did Not Attend   Description of Group: In this process group, members listened to a variety of genres of music and identified that different types of music evoke different responses.  Patients were encouraged to identify music that was soothing for them and music that was energizing for them.  Patients discussed how this knowledge can help with wellness and recovery in various ways including managing depression and anxiety as well as encouraging healthy sleep habits.    Therapeutic Goals: 1. Patients will explore the impact of different varieties of music on mood 2. Patients will verbalize the thoughts they have when listening to different types of music 3. Patients will identify music that is soothing to them as well as music that is energizing to them 4. Patients will discuss how to use this knowledge to assist in maintaining wellness and recovery 5. Patients will explore the use of music as a coping skill  Summary of Patient Progress:  N/A  Therapeutic Modalities: Solution Focused Brief Therapy Activity   Marvalene Barrett Grossman-Orr, LCSW    

## 2017-08-06 NOTE — Progress Notes (Signed)
D.  Pt in bed on approach, no complaints voiced at this time.  Pt appears very sleepy, minimal interaction.  Pt did not get up for evening AA group.  Pt denied SI/HI/AVH at this time.  A.  Support and encouragement offered, medication given as ordered  R.  Pt remains safe on the unit, will continue to monitor.

## 2017-08-07 DIAGNOSIS — Z560 Unemployment, unspecified: Secondary | ICD-10-CM

## 2017-08-07 LAB — GLUCOSE, CAPILLARY
Glucose-Capillary: 270 mg/dL — ABNORMAL HIGH (ref 65–99)
Glucose-Capillary: 91 mg/dL (ref 65–99)

## 2017-08-07 MED ORDER — IBUPROFEN 800 MG PO TABS: 800.0000 mg | ORAL_TABLET | Freq: Three times a day (TID) | ORAL | 0 refills | Status: DC | PRN

## 2017-08-07 MED ORDER — METOCLOPRAMIDE HCL 10 MG PO TABS: 10.0000 mg | ORAL_TABLET | Freq: Three times a day (TID) | ORAL | 0 refills | Status: DC

## 2017-08-07 MED ORDER — GABAPENTIN 300 MG PO CAPS: 600.0000 mg | ORAL_CAPSULE | Freq: Three times a day (TID) | ORAL | 0 refills | Status: DC

## 2017-08-07 MED ORDER — MIRTAZAPINE 15 MG PO TABS: 15.0000 mg | ORAL_TABLET | Freq: Every day | ORAL | 0 refills | Status: DC

## 2017-08-07 MED ORDER — INSULIN ASPART 100 UNIT/ML ~~LOC~~ SOLN: 12.0000 [IU] | Freq: Three times a day (TID) | SUBCUTANEOUS | 0 refills | Status: DC

## 2017-08-07 MED ORDER — SIMVASTATIN 20 MG PO TABS: 20.0000 mg | ORAL_TABLET | Freq: Every day | ORAL | 0 refills | Status: DC

## 2017-08-07 MED ORDER — BUSPIRONE HCL 7.5 MG PO TABS
15.0000 mg | ORAL_TABLET | Freq: Three times a day (TID) | ORAL | Status: DC
  Filled 2017-08-07: qty 42

## 2017-08-07 MED ORDER — NICOTINE 21 MG/24HR TD PT24: 21.0000 mg | MEDICATED_PATCH | Freq: Every day | TRANSDERMAL | 0 refills | Status: DC

## 2017-08-07 MED ORDER — PANTOPRAZOLE SODIUM 40 MG PO TBEC: 40.0000 mg | DELAYED_RELEASE_TABLET | Freq: Two times a day (BID) | ORAL | 0 refills | Status: DC

## 2017-08-07 MED ORDER — ARIPIPRAZOLE 10 MG PO TABS: 10.0000 mg | ORAL_TABLET | Freq: Every day | ORAL | 0 refills | Status: DC

## 2017-08-07 MED ORDER — BENZTROPINE MESYLATE 0.5 MG PO TABS: 0.5000 mg | ORAL_TABLET | Freq: Two times a day (BID) | ORAL | 0 refills | Status: DC

## 2017-08-07 MED ORDER — TRAZODONE HCL 100 MG PO TABS: 100.0000 mg | ORAL_TABLET | Freq: Every evening | ORAL | 0 refills | Status: DC | PRN

## 2017-08-07 MED ORDER — LISINOPRIL 10 MG PO TABS: 10.0000 mg | ORAL_TABLET | Freq: Every day | ORAL | 0 refills | Status: DC

## 2017-08-07 MED ORDER — BUSPIRONE HCL 15 MG PO TABS: 15.0000 mg | ORAL_TABLET | Freq: Three times a day (TID) | ORAL | 0 refills | Status: DC

## 2017-08-07 MED ORDER — INSULIN GLARGINE 100 UNIT/ML ~~LOC~~ SOLN: 70.0000 [IU] | Freq: Two times a day (BID) | SUBCUTANEOUS | 0 refills | Status: DC

## 2017-08-07 NOTE — Discharge Summary (Addendum)
Physician Discharge Summary Note  Patient:  Carolyn Ayala is an 42 y.o., female  MRN:  [id number]  DOB:  Feb 03, 1976  Patient phone:  [phone] (home)   Patient address:   [address] [place] [address],   Total Time spent with patient: Greater than 30 minutes  Date of Admission:  08/02/2017  Date of Discharge: 08-07-17  Reason for Admission: Worsening symptoms depression with psychosis.  Principal Problem: Depression, major, recurrent, severe with psychosis [facility])  Discharge Diagnoses: Patient Active Problem List   Diagnosis Date Noted  . Suicidal thoughts [R45.851]   . Hypokalemia [E87.6]   . HTN (hypertension) [I10] 09/24/2015  . Hyperlipidemia [E78.5] 08/10/2015  . Alcohol use disorder, moderate, dependence (HCC) [F10.20] 08/10/2015  . Neuropathic pain [M79.2] 08/08/2015  . 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 Generalized anxiety disorder [F41.1] 02/13/2013  . Chronic posttraumatic stress disorder [F43.12] 02/13/2013  . IDDM (insulin dependent diabetes mellitus) (HCC) [E11.9, Z79.4] 02/08/2013   Past Psychiatric History: Major depression.  Past Medical History:  Past Medical History:  Diagnosis Date  . Depression   . Diabetes mellitus without complication (HCC)   . Hypertension   . Mental disorder   . Noncompliance     Past Surgical History:  Procedure Laterality Date  . CESAREAN SECTION     Family History:  Family History  Problem Relation Age of Onset  . Depression Mother    Family Psychiatric  History: See Md's SRA.  Social History:  Social History   Substance and Sexual Activity  Alcohol Use Yes   Comment:  Daily use -- a 1/5 daily     Social History   Substance and Sexual Activity  Drug Use Yes  . Frequency: 7.0 times per week  . Types: Barbituates, Benzodiazepines   Comment: Pt denied to Chartered loss adjuster    Social History   Socioeconomic History  . Marital status: Single     Spouse name: None  . Number of children: None  . Years of education: None  . Highest education level: None  Social Needs  . Financial resource strain: None  . Food insecurity - worry: None  . Food insecurity - inability: None  . Transportation needs - medical: None  . Transportation needs - non-medical: None  Occupational History  . None  Tobacco Use  . Smoking status: Current Every Day Smoker    Packs/day: 1.00  . Smokeless tobacco: Current User  Substance and Sexual Activity  . Alcohol use: Yes    Comment:  Daily use -- a 1/5 daily  . Drug use: Yes    Frequency: 7.0 times per week    Types: Barbituates, Benzodiazepines    Comment: Pt denied to author  . Sexual activity: No  Other Topics Concern  . None  Social History Narrative   ** Merged History Encounter **       Hospital Course: (Per admission assessment): Patient is a 42 year old female transferred from [facility] for stabilization and treatment of worsening of depression along with hearing voices telling her that she was better off dead.  Patient also gives history of drinking 2/5 a day and adds that that the way she has been coping with her depression for many years now.  Patient reports that she came to [place] to attend her mother's funeral, has decided to stay here. Patient states that she is okay with going to the [facility] rescue mission as long as they take people with  diabetes. Patient states that her sugars have been out of control, as that she has blurry vision at times because of her diabetes.  She states that she is on 2 types of insulin but still her sugars fluctuate a lot.  She has that she wants her diabetes stabilized, feels that her depression might be contributing to her blood sugar fluctuations.  Carolyn Ayala was admitted to the [facility] adult unit with complaints of worsening symptoms of depression with auditory hallucinations mocking her that she was better off dead. She cited the recent death of her  mother as the trigger. She indicated on admission that she has been drinking excessively just to deal with her worsening depression. She been been off her mental health medications & has not been complaint with her medication regimen for her other medical issues. She was in need of mood stabilization treatments.   During the course of her hospitalization, Carolyn Ayala was medicated & discharged on, Abilify 10 mg for mood control, Cogentin 0.5 mg for EPS, Buspar 15 mg for anxiety, Gabapentin 600 mg for agitation/neuropathic pain, Mirtazapine 15 mg for depression/insomnia, Nicotine patch 21 mg for smoking cessation & Trazodone 100 mg for insomnia. She was enrolled & participated in the group counseling sessions being offered & held on this unit. She was counseled & learned coping skills that should help her cope better & maintain mood stability after discharge. She was resumed on all her pertinent home medications for the other previously existing medical issues presented. Carolyn Ayala tolerated her treatment regimen without any adverse effects reported.   While her treatment was on going, Carolyn Ayala's improvement was monitored by observation & her daily reports of symptom reduction noted. Her emotional & mental status were monitored by her daily self-inventory reports completed by her & the clinical staff. She was evaluated daily by the treatment team for mood stability & the need for continued recovery after discharge. She was offered further treatment options upon discharge & will follow up with the outpatient psychiatric services as listed below.     Upon discharge, Carolyn Ayala was both mentally & medically stable. She is currently denying suicidal, homicidal ideation, auditory, visual/tactile hallucinations, delusional thoughts & or paranoia. She was provided with a 7 days worth, supply samples of her [facility] discharge medications. She left [facility] with all personal belongings in no apparent distress. Transportation per bus  transportation service. [facility] assisted with bus pass.       Physical Findings: AIMS: Facial and Oral Movements Muscles of Facial Expression: None, normal Lips and Perioral Area: None, normal Jaw: None, normal Tongue: None, normal,Extremity Movements Upper (arms, wrists, hands, fingers): None, normal Lower (legs, knees, ankles, toes): None, normal, Trunk Movements Neck, shoulders, hips: None, normal, Overall Severity Severity of abnormal movements (highest score from questions above): None, normal Incapacitation due to abnormal movements: None, normal Patient's awareness of abnormal movements (rate only patient's report): No Awareness, Dental Status Current problems with teeth and/or dentures?: No Does patient usually wear dentures?: No  CIWA:  CIWA-Ar Total: 7 COWS:     Musculoskeletal: Strength & Muscle Tone: within normal limits Gait & Station: normal Patient leans: N/A  Psychiatric Specialty Exam: Physical Exam  Constitutional: She appears well-developed.  HENT:  Head: Normocephalic.  Eyes: Pupils are equal, round, and reactive to light.  Neck: Normal range of motion.  Cardiovascular: Normal rate.  Hx. HTN  Respiratory: Effort normal.  GI: Soft.  Genitourinary:  Genitourinary Comments: Deferred  Musculoskeletal: Normal range of motion.  Neurological: She is alert.  Skin:  Skin is warm.    Review of Systems  Constitutional: Negative.   HENT: Negative.   Eyes: Negative.   Respiratory: Negative.   Cardiovascular: Negative.        Hx. HTN  Gastrointestinal: Negative.   Genitourinary: Negative.   Musculoskeletal: Negative.   Skin: Negative.   Neurological: Negative.   Endo/Heme/Allergies: Negative.   Psychiatric/Behavioral: Positive for depression (Stable prior to discharge), hallucinations (Hx. auditory hallucinations (Stable)) and substance abuse (Hx. alcohol use disorder). Negative for memory loss and suicidal ideas. The patient has insomnia (Stable prior to  discharge). The patient is not nervous/anxious.     Blood pressure 132/86, pulse 89, temperature 97.8 F (36.6 C), temperature source Oral, resp. rate 18, height 5\' 2"  (1.575 m), weight 91.2 kg (201 lb).Body mass index is 36.76 kg/m.  See Md's SRA   Have you used any form of tobacco in the last 30 days? (Cigarettes, Smokeless Tobacco, Cigars, and/or Pipes): Yes  Has this patient used any form of tobacco in the last 30 days? (Cigarettes, Smokeless Tobacco, Cigars, and/or Pipes):Yes, an FDA-approved tobacco cessation medication was offered at discharge.  Blood Alcohol level:  Lab Results  Component Value Date   ETH <10 08/01/2017   ETH <10 07/26/2017   Metabolic Disorder Labs:  Lab Results  Component Value Date   HGBA1C 11.6 (H) 08/04/2017   MPG 286 08/04/2017   MPG 269 08/21/2015   Lab Results  Component Value Date   PROLACTIN 10.7 04/28/2015   Lab Results  Component Value Date   CHOL 248 (H) 08/07/2015   TRIG 195 (H) 08/07/2015   HDL 83 08/07/2015   CHOLHDL 3.0 08/07/2015   VLDL 39 08/07/2015   LDLCALC 126 (H) 08/07/2015   LDLCALC 132 (H) 04/29/2015   See Psychiatric Specialty Exam and Suicide Risk Assessment completed by Attending Physician prior to discharge.  Discharge destination:  Home  Is patient on multiple antipsychotic therapies at discharge:  No   Has Patient had three or more failed trials of antipsychotic monotherapy by history:  No  Recommended Plan for Multiple Antipsychotic Therapies: NA  Allergies as of 08/07/2017   No Known Allergies     Medication List    STOP taking these medications   acetaminophen 500 MG tablet Commonly known as:  TYLENOL   atorvastatin 40 MG tablet Commonly known as:  LIPITOR   cetirizine 10 MG tablet Commonly known as:  ZYRTEC   divalproex 500 MG DR tablet Commonly known as:  DEPAKOTE   haloperidol 0.5 MG tablet Commonly known as:  HALDOL   hydrOXYzine 50 MG tablet Commonly known as:  ATARAX/VISTARIL    methocarbamol 500 MG tablet Commonly known as:  ROBAXIN   multivitamin with minerals Tabs tablet   traMADol 50 MG tablet Commonly known as:  ULTRAM   VITAMIN B1 PO   VITAMIN E PO     TAKE these medications     Indication  ARIPiprazole 10 MG tablet Commonly known as:  ABILIFY Take 1 tablet (10 mg total) by mouth daily. For mood control Start taking on:  08/08/2017 What changed:    medication strength  how much to take  additional instructions  Indication:  Mood control   benztropine 0.5 MG tablet Commonly known as:  COGENTIN Take 1 tablet (0.5 mg total) by mouth 2 (two) times daily. For prevention of drug induced tremors  Indication:  Extrapyramidal Reaction caused by Medications   busPIRone 15 MG tablet Commonly known as:  BUSPAR Take 1 tablet (15  mg total) by mouth 3 (three) times daily. For anxiety What changed:  additional instructions  Indication:  Anxiety Disorder   gabapentin 300 MG capsule Commonly known as:  NEURONTIN Take 2 capsules (600 mg total) by mouth 3 (three) times daily. For agitation/diabetic neuropathic What changed:    how much to take  additional instructions  Another medication with the same name was removed. Continue taking this medication, and follow the directions you see here.  Indication:  Agitation, Neuropathic Pain   ibuprofen 800 MG tablet Commonly known as:  ADVIL,MOTRIN Take 1 tablet (800 mg total) by mouth every 8 (eight) hours as needed for moderate pain.  Indication:  Moderate pain   insulin aspart 100 UNIT/ML injection Commonly known as:  novoLOG Inject 12 Units into the skin 3 (three) times daily with meals. For diabetic management What changed:    how much to take  additional instructions  Indication:  Type 2 Diabetes   insulin glargine 100 UNIT/ML injection Commonly known as:  LANTUS Inject 0.7 mLs (70 Units total) into the skin 2 (two) times daily. For diabetic management What changed:    how much to  take  additional instructions  Indication:  Type 2 Diabetes   lisinopril 10 MG tablet Commonly known as:  PRINIVIL,ZESTRIL Take 1 tablet (10 mg total) by mouth daily. For high blood pressure Start taking on:  08/08/2017 What changed:  additional instructions  Indication:  High Blood Pressure Disorder   metoCLOPramide 10 MG tablet Commonly known as:  REGLAN Take 1 tablet (10 mg total) by mouth 4 (four) times daily -  before meals and at bedtime. For nausea What changed:  additional instructions  Indication:  Nausea and Vomiting   mirtazapine 15 MG tablet Commonly known as:  REMERON Take 1 tablet (15 mg total) by mouth at bedtime. For depression/sleep What changed:    medication strength  how much to take  additional instructions  Another medication with the same name was removed. Continue taking this medication, and follow the directions you see here.  Indication:  Trouble Sleeping, Major Depressive Disorder   nicotine 21 mg/24hr patch Commonly known as:  NICODERM CQ - dosed in mg/24 hours Place 1 patch (21 mg total) onto the skin daily. (May purchase from over the counter): For cigarette smoking Start taking on:  08/08/2017  Indication:  Nicotine Addiction   pantoprazole 40 MG tablet Commonly known as:  PROTONIX Take 1 tablet (40 mg total) by mouth 2 (two) times daily with a meal. For acid reflux What changed:  additional instructions  Indication:  Gastroesophageal Reflux Disease   simvastatin 20 MG tablet Commonly known as:  ZOCOR Take 1 tablet (20 mg total) by mouth daily at 6 PM. For high cholesterol What changed:  additional instructions  Indication:  High cholesterol   traZODone 100 MG tablet Commonly known as:  DESYREL Take 1 tablet (100 mg total) by mouth at bedtime as needed for sleep.  Indication:  Trouble Sleeping      Follow-up Information    [facility] Follow up on 08/08/2017.   Why:  Appointments are made by walk in only from  8:30am to 2pm.  Please arrive early to ensure you receive an appointment.   Contact information: [address] [place], [id number] Phone: [phone]       [facility] Follow up on 08/08/2017.   Why:  Appointments are made by walk in only 8am to 5pm.  Please arrive early to  ensure you receive an appointment.  Contact information: [address] [place], [address] Phone: [phone] Fax: [phone]          Follow-up recommendations: Activity:  As tolerated Diet: As recommended by your primary care doctor. Keep all scheduled follow-up appointments as recommended.    Comments: Patient is instructed prior to discharge to: Take all medications as prescribed by his/her mental healthcare provider. Report any adverse effects and or reactions from the medicines to his/her outpatient provider promptly. Patient has been instructed & cautioned: To not engage in alcohol and or illegal drug use while on prescription medicines. In the event of worsening symptoms, patient is instructed to call the crisis hotline, 911 and or go to the nearest ED for appropriate evaluation and treatment of symptoms. To follow-up with his/her primary care provider for your other medical issues, concerns and or health care needs.   Signed: Armandina Stammer, NP, PMHNP, FNP-BC 08/07/2017, 10:10 AM   Patient seen, Suicide Assessment Completed.  Disposition Plan Reviewed

## 2017-08-07 NOTE — Progress Notes (Signed)
Adult Psychoeducational Group Note  Date:  08/07/2017 Time:  10:44 AM  Group Topic/Focus:  Wellness Toolbox:   The focus of this group is to discuss various aspects of wellness, balancing those aspects and exploring ways to increase the ability to experience wellness.  Patients will create a wellness toolbox for use upon discharge.  Participation Level:  Active  Participation Quality:  Appropriate  Affect:  Appropriate  Cognitive:  Alert  Insight: Good  Engagement in Group:  Limited  Modes of Intervention:  Discussion and Education  Additional Comments:  Pt was able to attend group this morning and was concern about when her discharge date would be.  Kaleth Koy E 08/07/2017, 10:44 AM

## 2017-08-07 NOTE — Progress Notes (Signed)
Pt discharged home with a friend. Pt was ambulatory,stable and appreciative at that time. All papers medication sample, and prescriptions were given and valuables returned. Verbal understanding expressed. Denies SI/HI and A/VH. Pt given opportunity to express concerns and ask questions.

## 2017-08-07 NOTE — Progress Notes (Signed)
Inpatient Diabetes Program Recommendations  AACE/ADA: New Consensus Statement on Inpatient Glycemic Control (2015)  Target Ranges:  Prepandial:   less than 140 mg/dL      Peak postprandial:   less than 180 mg/dL (1-2 hours)      Critically ill patients:  140 - 180 mg/dL   Results for Carolyn Ayala, Elizabth Ayala (MRN [id number]) as of 08/07/2017 08:51  Ref. Range 08/05/2017 06:15 08/05/2017 11:56 08/05/2017 17:00  Glucose-Capillary Latest Ref Range: 65 - 99 mg/dL 409333 (H)  23 units Novolog +  60 units Lantus 79  8 units Novolog 484 (H)  28 units Novolog +  70 units Lantus   Results for Carolyn Ayala, Carolyn Ayala (MRN [id number]) as of 08/07/2017 08:51  Ref. Range 08/06/2017 05:57 08/06/2017 11:40 08/06/2017 16:43 08/06/2017 20:59  Glucose-Capillary Latest Ref Range: 65 - 99 mg/dL 956289 (H)  19 units Novolog +  70 units Lantus 189 (H)  16 units Novolog 437 (H)  32 units Novolog +  70 units Lantus 176 (H)   Results for Carolyn Ayala, Carolyn Ayala (MRN [id number]) as of 08/07/2017 08:51  Ref. Range 08/07/2017 05:47  Glucose-Capillary Latest Ref Range: 65 - 99 mg/dL 469270 (H)  23 units Novolog +  70 units Lantus    Home DM Meds: Lantus 70 units BID        Novolog 15 units TID        Novolog SSI        (see Dr. Jama Flavorsobos notes from 02/24- Patient states the above listed are her doses)   Current Orders: Lantus 70 units BID      Novolog Resistant Correction Scale/ SSI (0-20 units) TID AC      Novolog 12 units TID with meals     MD- Please consider the following in-hospital insulin adjustments:  1. Increase Lantus slightly to 75 units BID  2. Increase Novolog Meal Coverage to: Novolog 15 units TID with meals (hold if pt eats <50% of meal)     --Will follow patient during hospitalization--  Ambrose FinlandJeannine Johnston Marija Calamari RN, MSN, CDE Diabetes Coordinator Inpatient Glycemic Control Team Team Pager: [phone] (8a-5p)

## 2017-08-07 NOTE — BHH Suicide Risk Assessment (Signed)
[facility]   Principal Problem: Depression, major, recurrent, severe with psychosis [facility](HCC) Discharge Diagnoses:  Patient Active Problem List   Diagnosis Date Noted  . Suicidal thoughts [R45.851]   . Hypokalemia [E87.6]   . HTN (hypertension) [I10] 09/24/2015  . Hyperlipidemia [E78.5] 08/10/2015  . Alcohol use disorder, moderate, dependence (HCC) [F10.20] 08/10/2015  . Neuropathic pain [M79.2] 08/08/2015  . 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 Generalized anxiety disorder [F41.1] 02/13/2013  . Chronic posttraumatic stress disorder [F43.12] 02/13/2013  . IDDM (insulin dependent diabetes mellitus) (HCC) [E11.9, Z79.4] 02/08/2013    Total Time spent with patient: 30 minutes  Musculoskeletal: Strength & Muscle Tone: within normal limits Gait & Station: normal Patient leans: N/A  Psychiatric Specialty Exam: ROS denies headache, no chest pain, no shortness of breath , no vomiting   Blood pressure 132/86, pulse 89, temperature 97.8 F (36.6 C), temperature source Oral, resp. rate 18, height 5\' 2"  (1.575 m), weight 91.2 kg (201 lb).Body mass index is 36.76 kg/m.  General Appearance: improved grooming   Eye Contact::  Good  Speech:  Normal Rate409  Volume:  Normal  Mood:  reports mood is improving compared to how she felt prior to admission  Affect:  appropriate, anxious   Thought Process:  Linear and Descriptions of Associations: Intact  Orientation:  Full (Time, Place, and Person)  Thought Content:  denies hallucinations, no delusions, not internally preoccupied   Suicidal Thoughts:  No denies suicidal or self injurious ideations, denies homicidal or violent ideations  Homicidal Thoughts:  No  Memory:  recent and remote grossly intact   Judgement:  Other:  improving   Insight:  improving   Psychomotor Activity:  Normal  Concentration:  Good  Recall:  Good  Fund of Knowledge:Good  Language: Good  Akathisia:   Yes  Handed:  Right  AIMS (if indicated):     Assets:  Communication Skills Desire for Improvement Resilience  Sleep:  Number of Hours: 2.25  Cognition: WNL  ADL's:  Intact   Mental Status Per Nursing Assessment::   On Admission:     Demographic Factors:  42 year old single, no children, homeless, unemployed   Loss Factors: Unemployment , homelessness , chronic medical illness   Historical Factors: History of depression, history of suicide attempt in the past , history of alcohol use disorder  Reports history of hallucinations which she states occur during episodes of depression.   Risk Reduction Factors:   Positive coping skills or problem solving skills  Continued Clinical Symptoms:  At this time patient she presents alert and attentive, well related, mood is improved compared to admission although states she still feels vaguely depressed, affect is reactive, smiles at times appropriately, mildly anxious but this improves during session. No thought disorder, no suicidal or self injurious ideations, no homicidal or violent ideations, no hallucinations, no delusions, future oriented . Denies medication side effects. Behavior on unit in good control, presents pleasant on approach.   Cognitive Features That Contribute To Risk:  No gross cognitive deficits noted upon discharge. Is alert , attentive, and oriented x 3   Suicide Risk:  Mild:  Suicidal ideation of limited frequency, intensity, duration, and specificity.  There are no identifiable plans, no associated intent, mild dysphoria and related symptoms, good self-control (both objective and subjective assessment), few other risk factors, and identifiable protective factors, including available and accessible social support.  Follow-up Information    [facility] Follow up  on 08/08/2017.   Why:  Appointments are made by walk in only from 8:30am to 2pm.  Please arrive early to ensure you receive an appointment.    Contact information: [address] [place], [address] Phone: [phone]       [facility] Follow up on 08/08/2017.   Why:  Appointments are made by walk in only 8am to 5pm.  Please arrive early to ensure you receive an appointment.  Contact information: [address] [place], [address] Phone: [phone] Fax: [phone]           Plan Of Care/Follow-up recommendations:  Activity:  as tolerated  Diet:  Diabetic Diet Tests:  NA Other:  See below  Patient reports she is ready for discharge and is discharging in good spirits  Plans to follow up as above We have reviewed importance of alcohol abstinence as integral part of treatment goals, and have encouraged her to avoid people, places and situations she associates with alcohol in order to decrease cravings /triggers for relapse. 12 step program participation recommended . Importance of compliance with diabetic diet emphasized .  Craige Cotta, MD 08/07/2017, 9:46 AM

## 2017-08-07 NOTE — Progress Notes (Signed)
  [facility] :  Will you be returning to the same living situation after discharge:  No. [facility]. At discharge, do you have transportation home?: Yes,  bus pass and PART pass [facility]. Do you have the ability to pay for your medications: Yes,  mental health  Release of information consent forms completed and submitted to medical records by CSW.  Patient to Follow up at: Follow-up Information    [facility] Follow up on 08/08/2017.   Why:  Appointments are made by walk in only from 8:30am to 2pm.  Please arrive early to ensure you receive an appointment.   Contact information: [address] [place], [id number] Phone: [phone]       [facility] Follow up on 08/08[facility]2017.   Why:  Appointments are made by walk in only 8am to 5pm.  Please arrive early to ensure you receive an appointment.  Contact information: [address] [place], [id number] Phone: [phone] Fax: [phone]           Next level of care provider has access to [facility] Link:no  Safety Planning and Suicide Prevention discussed: Yes,  SPE completed with pt; pt declined to consent to family contact. SPI pamphlet and Mobile Crisis information provided.  Have you used any form of tobacco in the last 30 days? (Cigarettes, Smokeless Tobacco, Cigars, and/or Pipes): Yes  Has patient been referred to the Quitline?: Patient refused referral  Patient has been referred for addiction treatment: Yes  Pulte HomesHeather N Smart, LCSW 08/07/2017, 9:54 AM

## 2017-09-06 ENCOUNTER — Emergency Department (HOSPITAL_COMMUNITY)
Admission: EM | Admit: 2017-09-06 | Discharge: 2017-09-07 | Disposition: A | Discharge status: Home / Self Care | Payer: Self-pay | Attending: Emergency Medicine | Admitting: Emergency Medicine

## 2017-09-06 ENCOUNTER — Encounter (HOSPITAL_COMMUNITY): Payer: Self-pay | Admitting: Emergency Medicine

## 2017-09-06 DIAGNOSIS — Z794 Long term (current) use of insulin: Secondary | ICD-10-CM | POA: Insufficient documentation

## 2017-09-06 DIAGNOSIS — Z79899 Other long term (current) drug therapy: Secondary | ICD-10-CM | POA: Insufficient documentation

## 2017-09-06 DIAGNOSIS — F1010 Alcohol abuse, uncomplicated: Secondary | ICD-10-CM | POA: Insufficient documentation

## 2017-09-06 DIAGNOSIS — R739 Hyperglycemia, unspecified: Secondary | ICD-10-CM

## 2017-09-06 DIAGNOSIS — I10 Essential (primary) hypertension: Secondary | ICD-10-CM | POA: Insufficient documentation

## 2017-09-06 DIAGNOSIS — F17210 Nicotine dependence, cigarettes, uncomplicated: Secondary | ICD-10-CM | POA: Insufficient documentation

## 2017-09-06 DIAGNOSIS — E1165 Type 2 diabetes mellitus with hyperglycemia: Secondary | ICD-10-CM | POA: Insufficient documentation

## 2017-09-06 HISTORY — DX: Pure hypercholesterolemia, unspecified: E78.00

## 2017-09-07 ENCOUNTER — Emergency Department (HOSPITAL_COMMUNITY): Payer: Self-pay

## 2017-09-07 LAB — I-STAT VENOUS BLOOD GAS, ED
Acid-base deficit: 4 mmol/L — ABNORMAL HIGH (ref 0.0–2.0)
Bicarbonate: 19.7 mmol/L — ABNORMAL LOW (ref 20.0–28.0)
O2 Saturation: 99 %
TCO2: 21 mmol/L — ABNORMAL LOW (ref 22–32)
pCO2, Ven: 31.6 mmHg — ABNORMAL LOW (ref 44.0–60.0)
pH, Ven: 7.404 (ref 7.250–7.430)
pO2, Ven: 122 mmHg — ABNORMAL HIGH (ref 32.0–45.0)

## 2017-09-07 LAB — RAPID URINE DRUG SCREEN, HOSP PERFORMED
Amphetamines: NOT DETECTED
Barbiturates: NOT DETECTED
Benzodiazepines: NOT DETECTED
Cocaine: NOT DETECTED
Opiates: NOT DETECTED
Tetrahydrocannabinol: NOT DETECTED

## 2017-09-07 LAB — BASIC METABOLIC PANEL
Anion gap: 15 (ref 5–15)
BUN: 13 mg/dL (ref 6–20)
CO2: 19 mmol/L — ABNORMAL LOW (ref 22–32)
Calcium: 8.9 mg/dL (ref 8.9–10.3)
Chloride: 98 mmol/L — ABNORMAL LOW (ref 101–111)
Creatinine, Ser: 0.82 mg/dL (ref 0.44–1.00)
GFR calc Af Amer: >60 mL/min (ref >60)
GFR calc non Af Amer: >60 mL/min (ref >60)
Glucose, Bld: 614 mg/dL (ref 65–99)
Potassium: 4.2 mmol/L (ref 3.5–5.1)
Sodium: 132 mmol/L — ABNORMAL LOW (ref 135–145)

## 2017-09-07 LAB — URINALYSIS, ROUTINE W REFLEX MICROSCOPIC
Bacteria, UA: NONE SEEN
Bilirubin Urine: NEGATIVE
Glucose, UA: >=500 mg/dL — AB
Hgb urine dipstick: NEGATIVE
Ketones, ur: NEGATIVE mg/dL
Leukocytes, UA: NEGATIVE
Nitrite: NEGATIVE
Protein, ur: NEGATIVE mg/dL
Specific Gravity, Urine: 1.029 (ref 1.005–1.030)
pH: 6 (ref 5.0–8.0)

## 2017-09-07 LAB — I-STAT TROPONIN, ED: Troponin i, poc: 0 ng/mL (ref 0.00–0.08)

## 2017-09-07 LAB — CBC
HCT: 34.7 % — ABNORMAL LOW (ref 36.0–46.0)
Hemoglobin: 11 g/dL — ABNORMAL LOW (ref 12.0–15.0)
MCH: 24.8 pg — ABNORMAL LOW (ref 26.0–34.0)
MCHC: 31.7 g/dL (ref 30.0–36.0)
MCV: 78.3 fL (ref 78.0–100.0)
Platelets: 328 10*3/uL (ref 150–400)
RBC: 4.43 MIL/uL (ref 3.87–5.11)
RDW: 15.9 % — ABNORMAL HIGH (ref 11.5–15.5)
WBC: 4.9 10*3/uL (ref 4.0–10.5)

## 2017-09-07 LAB — I-STAT BETA HCG BLOOD, ED (MC, WL, AP ONLY): I-stat hCG, quantitative: <5 m[IU]/mL (ref <5)

## 2017-09-07 LAB — CBG MONITORING, ED
Glucose-Capillary: 260 mg/dL — ABNORMAL HIGH (ref 65–99)
Glucose-Capillary: 343 mg/dL — ABNORMAL HIGH (ref 65–99)
Glucose-Capillary: 382 mg/dL — ABNORMAL HIGH (ref 65–99)
Glucose-Capillary: 555 mg/dL (ref 65–99)

## 2017-09-07 LAB — ETHANOL: Alcohol, Ethyl (B): 94 mg/dL — ABNORMAL HIGH (ref <10)

## 2017-09-07 MED ORDER — SODIUM CHLORIDE 0.9 % IV BOLUS
1000.0000 mL | Freq: Once | INTRAVENOUS | Status: AC
  Administered 2017-09-07: 1000 mL via INTRAVENOUS

## 2017-09-07 MED ORDER — INSULIN ASPART 100 UNIT/ML ~~LOC~~ SOLN
8.0000 [IU] | Freq: Once | SUBCUTANEOUS | Status: AC
  Administered 2017-09-07: 8 [IU] via INTRAVENOUS
  Filled 2017-09-07: qty 1

## 2017-09-07 MED ORDER — INSULIN GLARGINE 100 UNIT/ML ~~LOC~~ SOLN: 60.0000 [IU] | Freq: Two times a day (BID) | SUBCUTANEOUS | 0 refills | Status: DC

## 2017-09-07 MED ORDER — INSULIN ASPART 100 UNIT/ML ~~LOC~~ SOLN: 12.0000 [IU] | Freq: Three times a day (TID) | SUBCUTANEOUS | 0 refills | Status: DC

## 2017-09-07 NOTE — ED Notes (Signed)
Pt ambulated to restroom with stand by assist, steady gait. Pt still very sleepy, was hard to arouse.

## 2017-09-07 NOTE — Discharge Instructions (Signed)
You need to keep a close eye on your blood sugar.  Avoid alcohol!!!! Follow-up with your primary care doctor. If you do not have one, see the health and wellness clinic. Return here for new concerns.

## 2017-09-07 NOTE — ED Notes (Signed)
[facility] pt reports CP present since 1900, central with radiation down L arm, sharp in nature 6/10. Pt also reports her sugars have been running high, >600 with EMS, noted to be 555 upon arrival. Pt given 324 ASA en route, 100ml NS

## 2017-09-07 NOTE — ED Provider Notes (Signed)
[facility] EMERGENCY DEPARTMENT Provider Note   CSN: [id number] Arrival date & time: 09/06/17  2347     History   Chief Complaint Chief Complaint  Patient presents with  . Chest Pain  . Hyperglycemia    HPI Carolyn Ayala is a 42 y.o. female.   Chest Pain    Hyperglycemia    LEVEL V CAVEAT:  AMS 42 y.o. F with hx of depression, DM, HLP, HTN, medication non-compliance, neuropathy, presenting to the ED for chest pain.  [place] and felt very sick on her stomach.  States she is not sure what is wrong.  Patient does not endorse any chest pain to me.  Patient is intermittently falling asleep during exam.  She arouses to verbal and tactile stimuli and will answer 1-2 questions but then fall back asleep.  She does have history of polysubstance abuse.  Will not answer when asked about alcohol or drug use today.  Past Medical History:  Diagnosis Date  . Depression   . Diabetes mellitus without complication (HCC)   . High cholesterol   . Hypertension   . Mental disorder   . Noncompliance     Patient Active Problem List   Diagnosis Date Noted  . Suicidal thoughts   . Hypokalemia   . HTN (hypertension) 09/24/2015  . Hyperlipidemia 08/10/2015  . Alcohol use disorder, moderate, dependence (HCC) 08/10/2015  . Neuropathic pain 08/08/2015  . 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 Generalized anxiety disorder 02/13/2013  . Chronic posttraumatic stress disorder 02/13/2013  . IDDM (insulin dependent diabetes mellitus) (HCC) 02/08/2013    Past Surgical History:  Procedure Laterality Date  . CESAREAN SECTION       OB History   None      Home Medications    Prior to Admission medications   Medication Sig Start Date End Date Taking? Authorizing Provider  ARIPiprazole (ABILIFY) 10 MG tablet Take 1 tablet (10 mg total) by mouth daily. For mood control  08/08/17   Armandina Stammer I, NP  benztropine (COGENTIN) 0.5 MG tablet Take 1 tablet (0.5 mg total) by mouth 2 (two) times daily. For prevention of drug induced tremors 08/07/17   Armandina Stammer I, NP  busPIRone (BUSPAR) 15 MG tablet Take 1 tablet (15 mg total) by mouth 3 (three) times daily. For anxiety 08/07/17   Armandina Stammer I, NP  gabapentin (NEURONTIN) 300 MG capsule Take 2 capsules (600 mg total) by mouth 3 (three) times daily. For agitation/diabetic neuropathic 08/07/17   Armandina Stammer I, NP  ibuprofen (ADVIL,MOTRIN) 800 MG tablet Take 1 tablet (800 mg total) by mouth every 8 (eight) hours as needed for moderate pain. 08/07/17   Armandina Stammer I, NP  insulin aspart (NOVOLOG) 100 UNIT/ML injection Inject 12 Units into the skin 3 (three) times daily with meals. For diabetic management 08/07/17   Armandina Stammer I, NP  insulin glargine (LANTUS) 100 UNIT/ML injection Inject 0.7 mLs (70 Units total) into the skin 2 (two) times daily. For diabetic management 08/07/17   Armandina Stammer I, NP  lisinopril (PRINIVIL,ZESTRIL) 10 MG tablet Take 1 tablet (10 mg total) by mouth daily. For high blood pressure 08/08/17   Nwoko, Nicole Kindred I, NP  metoCLOPramide (REGLAN) 10 MG tablet Take 1 tablet (10 mg total) by mouth 4 (four) times daily -  before meals and at bedtime. For nausea 08/07/17   Sanjuana Kava, NP  mirtazapine (REMERON) 15 MG tablet Take 1 tablet (15 mg total) by mouth at bedtime. For depression/sleep 08/07/17   Armandina Stammer I, NP  nicotine (NICODERM CQ - DOSED IN MG/24 HOURS) 21 mg/24hr patch Place 1 patch (21 mg total) onto the skin daily. (May purchase from over the counter): For cigarette smoking 08/08/17   Armandina Stammer I, NP  pantoprazole (PROTONIX) 40 MG tablet Take 1 tablet (40 mg total) by mouth 2 (two) times daily with a meal. For acid reflux 08/07/17   Armandina Stammer I, NP  simvastatin (ZOCOR) 20 MG tablet Take 1 tablet (20 mg total) by mouth daily at 6 PM. For high cholesterol 08/07/17   Armandina Stammer I, NP  traZODone  (DESYREL) 100 MG tablet Take 1 tablet (100 mg total) by mouth at bedtime as needed for sleep. 08/07/17   Sanjuana Kava, NP    Family History Family History  Problem Relation Age of Onset  . Depression Mother     Social History Social History   Tobacco Use  . Smoking status: Current Every Day Smoker    Packs/day: 1.00  . Smokeless tobacco: Current User  Substance Use Topics  . Alcohol use: Yes    Comment:  Daily use -- a 1/5 daily  . Drug use: Yes    Frequency: 7.0 times per week    Types: Barbituates, Benzodiazepines    Comment: Pt denied to author     Allergies   Patient has no known allergies.   Review of Systems Review of Systems  Unable to perform ROS: Other     Physical Exam Updated Vital Signs BP 115/75 (BP Location: Right Arm)   Pulse (!) 117   Temp 98.4 F (36.9 C) (Oral)   Resp (!) 22   Ht 5\' 2"  (1.575 m)   Wt 90.7 kg (200 lb)   SpO2 97%   BMI 36.58 kg/m   Physical Exam  Constitutional: She is oriented to person, place, and time. She appears well-developed and well-nourished.  Appears drowsy, falling asleep during exam  HENT:  Head: Normocephalic and atraumatic.  Mouth/Throat: Oropharynx is clear and moist.  Eyes: Pupils are equal, round, and reactive to light. Conjunctivae and EOM are normal.  Neck: Normal range of motion.  Cardiovascular: Normal rate, regular rhythm and normal heart sounds.  Pulmonary/Chest: Effort normal and breath sounds normal. No accessory muscle usage.  Abdominal: Soft. Bowel sounds are normal.  Musculoskeletal: Normal range of motion.  Neurological: She is alert and oriented to person, place, and time.  Skin: Skin is warm and dry.  Psychiatric: She has a normal mood and affect.  Nursing note and vitals reviewed.    ED Treatments / Results  Labs (all labs ordered are listed, but only abnormal results are displayed) Labs Reviewed  BASIC METABOLIC PANEL - Abnormal; Notable for the following components:      Result  Value   Sodium 132 (*)    Chloride 98 (*)    CO2 19 (*)    Glucose, Bld 614 (*)    All other components within normal limits  CBC - Abnormal; Notable for the following components:   Hemoglobin 11.0 (*)    HCT 34.7 (*)    MCH 24.8 (*)    RDW 15.9 (*)    All other components within normal limits  URINALYSIS, ROUTINE W REFLEX MICROSCOPIC - Abnormal; Notable for the following components:   Color, Urine STRAW (*)    Glucose, UA >=500 (*)  Squamous Epithelial / LPF 0-5 (*)    All other components within normal limits  ETHANOL - Abnormal; Notable for the following components:   Alcohol, Ethyl (B) 94 (*)    All other components within normal limits  CBG MONITORING, ED - Abnormal; Notable for the following components:   Glucose-Capillary 555 (*)    All other components within normal limits  I-STAT VENOUS BLOOD GAS, ED - Abnormal; Notable for the following components:   pCO2, Ven 31.6 (*)    pO2, Ven 122.0 (*)    Bicarbonate 19.7 (*)    TCO2 21 (*)    Acid-base deficit 4.0 (*)    All other components within normal limits  CBG MONITORING, ED - Abnormal; Notable for the following components:   Glucose-Capillary 382 (*)    All other components within normal limits  CBG MONITORING, ED - Abnormal; Notable for the following components:   Glucose-Capillary 343 (*)    All other components within normal limits  CBG MONITORING, ED - Abnormal; Notable for the following components:   Glucose-Capillary 260 (*)    All other components within normal limits  RAPID URINE DRUG SCREEN, HOSP PERFORMED  I-STAT BETA HCG BLOOD, ED ([facility], [facility], [facility] ONLY)  I-STAT TROPONIN, ED    EKG EKG Interpretation  Date/Time:  Wednesday September 06 2017 23:49:32 EDT Ventricular Rate:  118 PR Interval:    QRS Duration: 74 QT Interval:  325 QTC Calculation: 456 R Axis:   67 Text Interpretation:  Sinus tachycardia Borderline low voltage, extremity leads No significant change since last tracing Confirmed by Rochele RaringWard,  Kristen [phone]([id number]) on 09/07/2017 12:11:17 AM   Radiology No results found.  Procedures Procedures (including critical care time)  Medications Ordered in ED Medications - No data to display   Initial Impression / Assessment and Plan / ED Course  I have reviewed the triage vital signs and the nursing notes.  Pertinent labs & imaging results that were available during my care of the patient were reviewed by me and considered in my medical decision making (see chart for details).  42 year old female presenting to the ED with chest pain.  [place]  She is hyperglycemic to nearly 600.  On initial presentation, patient appears somewhat drowsy and is intermittently falling asleep during exam.  She arouses to verbal and tactile stimuli, will answer a few questions appropriately but then fall back asleep.  She was apparently doing this with EMS as well.  She was able to move herself from the EMS stretcher to the ED stretcher without issue.  She does have long-standing history of polysubstance abuse, will not answer me when I ask about use in the past 24 hours.  We will plan for screening labs including ethanol and UDS.  IV fluid bolus infusing.  Patient's labs with hyperglycemia but no findings of DKA as she has a normal anion gap, no ketones in the urine.  Ethanol elevated at 94 which I suspect may be contributing to some of her drowsiness.  Her UDS is negative.  No acute findings from a cardiac standpoint.  Patient was aggressively fluid resuscitated here with 3 L IV fluids and small bolus of insulin.  Will monitor to ensure she returns to baseline.  5:25 AM On repeat evaluation, patient is sleeping but once awoken she is awake, alert, fully oriented.  She is able to answer questions and follow commands appropriately.  Appears back to baseline.  She now  admits to drinking 1 large Heineken prior to coming to the ED.  She denies any illicit drug use.   States she has run out of her home insulin and needs refills-- I have re-written these for her at previous dosing schedule.  Her blood sugar here has improved to 260.  Vitals are stable.  I do feel she is appropriate for discharge home.  We have discussed the importance of medication compliance and monitoring her glucose at home.  Close follow-up with PCP.  Encouraged alcohol cessation.  She understands to return here for any new/acute changes.  Final Clinical Impressions(s) / ED Diagnoses   Final diagnoses:  Hyperglycemia  Alcohol abuse    ED Discharge Orders        Ordered    insulin aspart (NOVOLOG) 100 UNIT/ML injection  3 times daily before meals     09/07/17 0530    insulin glargine (LANTUS) 100 UNIT/ML injection  2 times daily     09/07/17 0530       Garlon Hatchet, PA-C 09/07/17 0556    Ward, Layla Maw, DO 09/07/17 [phone]

## 2017-09-07 NOTE — ED Notes (Signed)
CBG 382, another liter of NS running

## 2017-09-11 ENCOUNTER — Encounter (HOSPITAL_COMMUNITY): Payer: Self-pay | Admitting: Emergency Medicine

## 2017-09-11 ENCOUNTER — Inpatient Hospital Stay (HOSPITAL_COMMUNITY)
Admission: EM | Admit: 2017-09-11 | Discharge: 2017-09-15 | DRG: 638 | Disposition: A | Discharge status: Home / Self Care | Payer: Self-pay | Attending: Internal Medicine | Admitting: Internal Medicine

## 2017-09-11 ENCOUNTER — Other Ambulatory Visit: Payer: Self-pay

## 2017-09-11 DIAGNOSIS — F411 Generalized anxiety disorder: Secondary | ICD-10-CM | POA: Diagnosis present

## 2017-09-11 DIAGNOSIS — E1110 Type 2 diabetes mellitus with ketoacidosis without coma: Principal | ICD-10-CM | POA: Diagnosis present

## 2017-09-11 DIAGNOSIS — F17210 Nicotine dependence, cigarettes, uncomplicated: Secondary | ICD-10-CM | POA: Diagnosis present

## 2017-09-11 DIAGNOSIS — I10 Essential (primary) hypertension: Secondary | ICD-10-CM | POA: Diagnosis present

## 2017-09-11 DIAGNOSIS — E119 Type 2 diabetes mellitus without complications: Secondary | ICD-10-CM

## 2017-09-11 DIAGNOSIS — E785 Hyperlipidemia, unspecified: Secondary | ICD-10-CM | POA: Diagnosis present

## 2017-09-11 DIAGNOSIS — IMO0001 Reserved for inherently not codable concepts without codable children: Secondary | ICD-10-CM

## 2017-09-11 DIAGNOSIS — E7800 Pure hypercholesterolemia, unspecified: Secondary | ICD-10-CM | POA: Diagnosis present

## 2017-09-11 DIAGNOSIS — F333 Major depressive disorder, recurrent, severe with psychotic symptoms: Secondary | ICD-10-CM | POA: Diagnosis present

## 2017-09-11 DIAGNOSIS — E876 Hypokalemia: Secondary | ICD-10-CM | POA: Diagnosis not present

## 2017-09-11 DIAGNOSIS — Z818 Family history of other mental and behavioral disorders: Secondary | ICD-10-CM

## 2017-09-11 DIAGNOSIS — F1020 Alcohol dependence, uncomplicated: Secondary | ICD-10-CM | POA: Diagnosis present

## 2017-09-11 DIAGNOSIS — Z794 Long term (current) use of insulin: Secondary | ICD-10-CM

## 2017-09-11 LAB — COMPREHENSIVE METABOLIC PANEL
ALT: 52 U/L (ref 14–54)
AST: 33 U/L (ref 15–41)
Albumin: 4.4 g/dL (ref 3.5–5.0)
Alkaline Phosphatase: 143 U/L — ABNORMAL HIGH (ref 38–126)
Anion gap: 29 — ABNORMAL HIGH (ref 5–15)
BUN: 19 mg/dL (ref 6–20)
CO2: 7 mmol/L — ABNORMAL LOW (ref 22–32)
Calcium: 9.7 mg/dL (ref 8.9–10.3)
Chloride: 98 mmol/L — ABNORMAL LOW (ref 101–111)
Creatinine, Ser: 1.11 mg/dL — ABNORMAL HIGH (ref 0.44–1.00)
GFR calc Af Amer: >60 mL/min (ref >60)
GFR calc non Af Amer: >60 mL/min (ref >60)
Glucose, Bld: 487 mg/dL — ABNORMAL HIGH (ref 65–99)
Potassium: 5 mmol/L (ref 3.5–5.1)
Sodium: 134 mmol/L — ABNORMAL LOW (ref 135–145)
Total Bilirubin: 1.5 mg/dL — ABNORMAL HIGH (ref 0.3–1.2)
Total Protein: 8.4 g/dL — ABNORMAL HIGH (ref 6.5–8.1)

## 2017-09-11 LAB — URINALYSIS, ROUTINE W REFLEX MICROSCOPIC
Bacteria, UA: NONE SEEN
Bilirubin Urine: NEGATIVE
Glucose, UA: >=500 mg/dL — AB
Ketones, ur: 80 mg/dL — AB
Leukocytes, UA: NEGATIVE
Nitrite: NEGATIVE
Protein, ur: 30 mg/dL — AB
Specific Gravity, Urine: 1.017 (ref 1.005–1.030)
pH: 5 (ref 5.0–8.0)

## 2017-09-11 LAB — I-STAT BETA HCG BLOOD, ED (MC, WL, AP ONLY): I-stat hCG, quantitative: <5 m[IU]/mL (ref <5)

## 2017-09-11 LAB — CBC
HCT: 41.2 % (ref 36.0–46.0)
Hemoglobin: 13.1 g/dL (ref 12.0–15.0)
MCH: 26 pg (ref 26.0–34.0)
MCHC: 31.8 g/dL (ref 30.0–36.0)
MCV: 81.9 fL (ref 78.0–100.0)
Platelets: 421 10*3/uL — ABNORMAL HIGH (ref 150–400)
RBC: 5.03 MIL/uL (ref 3.87–5.11)
RDW: 16.9 % — ABNORMAL HIGH (ref 11.5–15.5)
WBC: 20.7 10*3/uL — ABNORMAL HIGH (ref 4.0–10.5)

## 2017-09-11 LAB — LIPASE, BLOOD: Lipase: 19 U/L (ref 11–51)

## 2017-09-11 LAB — ETHANOL: Alcohol, Ethyl (B): <10 mg/dL (ref <10)

## 2017-09-11 MED ORDER — SODIUM CHLORIDE 0.9 % IV BOLUS
1000.0000 mL | Freq: Once | INTRAVENOUS | Status: AC
  Administered 2017-09-12: 1000 mL via INTRAVENOUS

## 2017-09-11 MED ORDER — SODIUM CHLORIDE 0.9 % IV SOLN
INTRAVENOUS | Status: DC
  Administered 2017-09-12: 5.4 [IU]/h via INTRAVENOUS
  Filled 2017-09-11: qty 1

## 2017-09-11 MED ORDER — DEXTROSE-NACL 5-0.45 % IV SOLN: INTRAVENOUS | Status: DC

## 2017-09-11 MED ORDER — SODIUM CHLORIDE 0.9 % IV SOLN: INTRAVENOUS | Status: DC

## 2017-09-11 NOTE — ED Notes (Signed)
Pt has urine cup to give specimen in when able to urinate; instructed to notify registration so specimen can be sent to lab from triage, if able.

## 2017-09-11 NOTE — ED Provider Notes (Signed)
[facility]-EMERGENCY DEPT Provider Note   CSN: [id number] Arrival date & time: 09/11/17  1754     History   Chief Complaint Chief Complaint  Patient presents with  . Emesis    HPI Carolyn Ayala is a 42 y.o. female.  The history is provided by the patient and medical records. No language interpreter was used.  Emesis   Associated symptoms include abdominal pain. Pertinent negatives include no diarrhea.   Carolyn Ayala is a 42 y.o. female  with a PMH of DM who presents to the Emergency Department complaining of n/v and abdominal pain. Patient states that she didn't feel well yesterday. She endorses fatigue, nausea and overall, just not feeling well. This morning, around 6:30 AM, she began having generalized abdominal pain, nausea and vomiting. Approximately 25 episodes of emesis thus far today.  She has taken no medications prior to arrival for her symptoms.  She typically is on 25 units of Lantus in the morning and NovoLog sliding scale.  She did not take Lantus this morning or any of her sliding scale NovoLog as she did not eat all day and did not feel well.  She endorses taking these medications regularly, but is requesting to speak with someone about more education about her medications and diabetes in general.  She reports not really understanding medications or her diabetes in general. No fever, chills, diarrhea, constipation, blood in stools, cough, congestion, urinary symptoms or back pain.   Past Medical History:  Diagnosis Date  . Depression   . Diabetes mellitus without complication (HCC)   . High cholesterol   . Hypertension   . Mental disorder   . Noncompliance     Patient Active Problem List   Diagnosis Date Noted  . DKA (diabetic ketoacidoses) (HCC) 09/12/2017  . Suicidal thoughts   . Hypokalemia   . HTN (hypertension) 09/24/2015  . Hyperlipidemia 08/10/2015  . Alcohol use disorder, moderate, dependence (HCC) 08/10/2015    . Neuropathic pain 08/08/2015  . 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 Generalized anxiety disorder 02/13/2013  . Chronic posttraumatic stress disorder 02/13/2013  . IDDM (insulin dependent diabetes mellitus) (HCC) 02/08/2013    Past Surgical History:  Procedure Laterality Date  . CESAREAN SECTION       OB History   None      Home Medications    Prior to Admission medications   Medication Sig Start Date End Date Taking? Authorizing Provider  ARIPiprazole (ABILIFY) 10 MG tablet Take 1 tablet (10 mg total) by mouth daily. For mood control Patient taking differently: Take 30 mg by mouth daily. For mood control 08/08/17  Yes Nwoko, Nicole Kindred I, NP  atorvastatin (LIPITOR) 40 MG tablet Take 40 mg by mouth daily.   Yes [provider]  benztropine (COGENTIN) 0.5 MG tablet Take 1 tablet (0.5 mg total) by mouth 2 (two) times daily. For prevention of drug induced tremors 08/07/17  Yes Nwoko, Nicole Kindred I, NP  busPIRone (BUSPAR) 15 MG tablet Take 1 tablet (15 mg total) by mouth 3 (three) times daily. For anxiety 08/07/17  Yes Armandina Stammer I, NP  gabapentin (NEURONTIN) 300 MG capsule Take 2 capsules (600 mg total) by mouth 3 (three) times daily. For agitation/diabetic neuropathic Patient taking differently: Take 900 mg by mouth 3 (three) times daily. For agitation/diabetic neuropathic 08/07/17  Yes Armandina Stammer I, NP  insulin aspart (NOVOLOG) 100 UNIT/ML injection Inject 12 Units into the skin 3 (three) times daily before meals.  09/07/17  Yes Garlon HatchetSanders, Lisa M, PA-C  insulin glargine (LANTUS) 100 UNIT/ML injection Inject 0.6 mLs (60 Units total) into the skin 2 (two) times daily. Patient taking differently: Inject 25 Units into the skin daily.  09/07/17  Yes Garlon HatchetSanders, Lisa M, PA-C  lisinopril (PRINIVIL,ZESTRIL) 10 MG tablet Take 1 tablet (10 mg total) by mouth daily. For high blood pressure 08/08/17  Yes Nwoko, Nicole KindredAgnes I, NP  metoCLOPramide (REGLAN) 10 MG tablet Take 1  tablet (10 mg total) by mouth 4 (four) times daily -  before meals and at bedtime. For nausea 08/07/17  Yes Armandina StammerNwoko, Agnes I, NP  mirtazapine (REMERON) 15 MG tablet Take 1 tablet (15 mg total) by mouth at bedtime. For depression/sleep Patient taking differently: Take 30 mg by mouth at bedtime. For depression/sleep 08/07/17  Yes Armandina StammerNwoko, Agnes I, NP  pantoprazole (PROTONIX) 40 MG tablet Take 1 tablet (40 mg total) by mouth 2 (two) times daily with a meal. For acid reflux 08/07/17  Yes Nwoko, Nicole KindredAgnes I, NP  ibuprofen (ADVIL,MOTRIN) 800 MG tablet Take 1 tablet (800 mg total) by mouth every 8 (eight) hours as needed for moderate pain. Patient not taking: Reported on 09/12/2017 08/07/17   Armandina StammerNwoko, Agnes I, NP  nicotine (NICODERM CQ - DOSED IN MG/24 HOURS) 21 mg/24hr patch Place 1 patch (21 mg total) onto the skin daily. (May purchase from over the counter): For cigarette smoking Patient not taking: Reported on 09/12/2017 08/08/17   Armandina StammerNwoko, Agnes I, NP  simvastatin (ZOCOR) 20 MG tablet Take 1 tablet (20 mg total) by mouth daily at 6 PM. For high cholesterol Patient not taking: Reported on 09/12/2017 08/07/17   Armandina StammerNwoko, Agnes I, NP  traZODone (DESYREL) 100 MG tablet Take 1 tablet (100 mg total) by mouth at bedtime as needed for sleep. Patient not taking: Reported on 09/12/2017 08/07/17   Sanjuana KavaNwoko, Agnes I, NP    Family History Family History  Problem Relation Age of Onset  . Depression Mother     Social History Social History   Tobacco Use  . Smoking status: Current Every Day Smoker    Packs/day: 1.00  . Smokeless tobacco: Current User  Substance Use Topics  . Alcohol use: Yes    Comment:  Daily use -- a 1/5 daily  . Drug use: Yes    Frequency: 7.0 times per week    Types: Barbituates, Benzodiazepines    Comment: Pt denied to author     Allergies   Patient has no known allergies.   Review of Systems Review of Systems  Gastrointestinal: Positive for abdominal pain, nausea and vomiting. Negative for blood in  stool, constipation and diarrhea.  All other systems reviewed and are negative.    Physical Exam Updated Vital Signs BP 121/70   Pulse (!) 139   Temp 97.7 F (36.5 C) (Oral)   Resp (!) 27   Ht 5\' 2"  (1.575 m)   Wt 86.2 kg (190 lb)   SpO2 100%   BMI 34.75 kg/m   Physical Exam  Constitutional: She is oriented to person, place, and time. She appears well-developed and well-nourished. No distress.  HENT:  Head: Normocephalic and atraumatic.  Cardiovascular: Normal heart sounds.  No murmur heard. Tachycardic but regular.  Pulmonary/Chest: Effort normal and breath sounds normal. No respiratory distress.  Abdominal: Soft. Bowel sounds are normal. She exhibits no distension.  Diffuse abdominal tenderness without focality.  Musculoskeletal: She exhibits no edema.  Neurological: She is alert and oriented to person, place, and time.  Skin: Skin is warm and dry.  Nursing note and vitals reviewed.    ED Treatments / Results  Labs (all labs ordered are listed, but only abnormal results are displayed) Labs Reviewed  COMPREHENSIVE METABOLIC PANEL - Abnormal; Notable for the following components:      Result Value   Sodium 134 (*)    Chloride 98 (*)    CO2 7 (*)    Glucose, Bld 487 (*)    Creatinine, Ser 1.11 (*)    Total Protein 8.4 (*)    Alkaline Phosphatase 143 (*)    Total Bilirubin 1.5 (*)    Anion gap 29 (*)    All other components within normal limits  CBC - Abnormal; Notable for the following components:   WBC 20.7 (*)    RDW 16.9 (*)    Platelets 421 (*)    All other components within normal limits  URINALYSIS, ROUTINE W REFLEX MICROSCOPIC - Abnormal; Notable for the following components:   Glucose, UA >=500 (*)    Hgb urine dipstick SMALL (*)    Ketones, ur 80 (*)    Protein, ur 30 (*)    Squamous Epithelial / LPF 0-5 (*)    All other components within normal limits  BLOOD GAS, VENOUS - Abnormal; Notable for the following components:   pH, Ven 7.013 (*)     pCO2, Ven 30.1 (*)    Bicarbonate 7.3 (*)    Acid-base deficit 23.5 (*)    All other components within normal limits  CBG MONITORING, ED - Abnormal; Notable for the following components:   Glucose-Capillary >600 (*)    All other components within normal limits  LIPASE, BLOOD  ETHANOL  HIV ANTIBODY (ROUTINE TESTING)  BASIC METABOLIC PANEL  BASIC METABOLIC PANEL  BASIC METABOLIC PANEL  BASIC METABOLIC PANEL  BASIC METABOLIC PANEL  LACTIC ACID, PLASMA  LACTIC ACID, PLASMA  I-STAT BETA HCG BLOOD, ED ([facility], [facility], [facility] ONLY)    EKG None  Radiology No results found.  Procedures Procedures (including critical care time)  CRITICAL CARE Performed by: Chase Picket Ward   Total critical care time: 35 minutes  Critical care time was exclusive of separately billable procedures and treating other patients.  Critical care was necessary to treat or prevent imminent or life-threatening deterioration.  Critical care was time spent personally by me on the following activities: development of treatment plan with patient and/or surrogate as well as nursing, discussions with consultants, evaluation of patient's response to treatment, examination of patient, obtaining history from patient or surrogate, ordering and performing treatments and interventions, ordering and review of laboratory studies, ordering and review of radiographic studies, pulse oximetry and re-evaluation of patient's condition.   Medications Ordered in ED Medications  sodium chloride 0.9 % bolus 1,000 mL (1,000 mLs Intravenous New Bag/Given 09/12/17 0012)    And  sodium chloride 0.9 % bolus 1,000 mL (has no administration in time range)    And  sodium chloride 0.9 % bolus 1,000 mL (has no administration in time range)  0.9 %  sodium chloride infusion (has no administration in time range)  dextrose 5 %-0.45 % sodium chloride infusion (has no administration in time range)  insulin regular (NOVOLIN R,HUMULIN R) 100 Units in  sodium chloride 0.9 % 100 mL (1 Units/mL) infusion (has no administration in time range)  enoxaparin (LOVENOX) injection 40 mg (has no administration in time range)  metoCLOPramide (REGLAN) injection 10 mg (10 mg Intravenous Given 09/12/17 0041)  Initial Impression / Assessment and Plan / ED Course  I have reviewed the triage vital signs and the nursing notes.  Pertinent labs & imaging results that were available during my care of the patient were reviewed by me and considered in my medical decision making (see chart for details).    Carolyn Ayala is a 42 y.o. female who presents to ED for nausea, vomiting. Hx of insulin-dependent diabetes.  Afebrile with no recent illness.  No peritoneal signs on abdominal exam.  Labs reviewed.  CBC with leukocytosis of 20.7.  She does have a glucose of 487, CO2 of 7 and anion gap of 29 on CMP concerning for DKA.  80 ketones in the urine as well.  VBG of 7.013.  Started on IV fluid and glucose stabilizer.  Hospitalist consulted who will admit.  Of note, patient requesting to speak with diabetic educator for further education on her diabetes in general as well as medications and diet.  Final Clinical Impressions(s) / ED Diagnoses   Final diagnoses:  Diabetic ketoacidosis without coma associated with type 2 diabetes mellitus [facility])    ED Discharge Orders    None       Ward, Chase Picket, PA-C 09/12/17 0128    Devoria Albe, MD 09/12/17 [phone]

## 2017-09-11 NOTE — ED Notes (Addendum)
Pt gave urine sample, EMT first collected sample. EMT first tried to also get vitals rechecked, Pt politely declined stated that she just got here. Informed Pt that we recheck them every few hours. Pt stated maybe next time.

## 2017-09-11 NOTE — ED Notes (Signed)
Pt is aware a urine sample is needed, but is unable to provide one at this time. 

## 2017-09-11 NOTE — ED Notes (Signed)
Per EMS pt complaint of n/v onset 6 hours ago.

## 2017-09-12 ENCOUNTER — Other Ambulatory Visit: Payer: Self-pay

## 2017-09-12 DIAGNOSIS — Z794 Long term (current) use of insulin: Secondary | ICD-10-CM

## 2017-09-12 DIAGNOSIS — E1110 Type 2 diabetes mellitus with ketoacidosis without coma: Secondary | ICD-10-CM | POA: Diagnosis present

## 2017-09-12 DIAGNOSIS — E119 Type 2 diabetes mellitus without complications: Secondary | ICD-10-CM

## 2017-09-12 DIAGNOSIS — F411 Generalized anxiety disorder: Secondary | ICD-10-CM

## 2017-09-12 DIAGNOSIS — F333 Major depressive disorder, recurrent, severe with psychotic symptoms: Secondary | ICD-10-CM

## 2017-09-12 LAB — GLUCOSE, CAPILLARY
Glucose-Capillary: 277 mg/dL — ABNORMAL HIGH (ref 65–99)
Glucose-Capillary: 343 mg/dL — ABNORMAL HIGH (ref 65–99)
Glucose-Capillary: 339 mg/dL — ABNORMAL HIGH (ref 65–99)
Glucose-Capillary: 323 mg/dL — ABNORMAL HIGH (ref 65–99)
Glucose-Capillary: 294 mg/dL — ABNORMAL HIGH (ref 65–99)
Glucose-Capillary: 278 mg/dL — ABNORMAL HIGH (ref 65–99)
Glucose-Capillary: 262 mg/dL — ABNORMAL HIGH (ref 65–99)
Glucose-Capillary: 214 mg/dL — ABNORMAL HIGH (ref 65–99)

## 2017-09-12 LAB — CBG MONITORING, ED
Glucose-Capillary: 177 mg/dL — ABNORMAL HIGH (ref 65–99)
Glucose-Capillary: 191 mg/dL — ABNORMAL HIGH (ref 65–99)
Glucose-Capillary: 266 mg/dL — ABNORMAL HIGH (ref 65–99)
Glucose-Capillary: 267 mg/dL — ABNORMAL HIGH (ref 65–99)
Glucose-Capillary: 363 mg/dL — ABNORMAL HIGH (ref 65–99)
Glucose-Capillary: 406 mg/dL — ABNORMAL HIGH (ref 65–99)
Glucose-Capillary: 415 mg/dL — ABNORMAL HIGH (ref 65–99)
Glucose-Capillary: 421 mg/dL — ABNORMAL HIGH (ref 65–99)
Glucose-Capillary: 485 mg/dL — ABNORMAL HIGH (ref 65–99)
Glucose-Capillary: 562 mg/dL (ref 65–99)
Glucose-Capillary: >600 mg/dL (ref 65–99)

## 2017-09-12 LAB — BASIC METABOLIC PANEL
Anion gap: 24 — ABNORMAL HIGH (ref 5–15)
Anion gap: 22 — ABNORMAL HIGH (ref 5–15)
Anion gap: 23 — ABNORMAL HIGH (ref 5–15)
BUN: 25 mg/dL — ABNORMAL HIGH (ref 6–20)
BUN: 25 mg/dL — ABNORMAL HIGH (ref 6–20)
BUN: 22 mg/dL — ABNORMAL HIGH (ref 6–20)
BUN: 19 mg/dL (ref 6–20)
CO2: 11 mmol/L — ABNORMAL LOW (ref 22–32)
CO2: 9 mmol/L — ABNORMAL LOW (ref 22–32)
CO2: 7 mmol/L — ABNORMAL LOW (ref 22–32)
CO2: <7 mmol/L — ABNORMAL LOW (ref 22–32)
Calcium: 8.4 mg/dL — ABNORMAL LOW (ref 8.9–10.3)
Calcium: 8.3 mg/dL — ABNORMAL LOW (ref 8.9–10.3)
Calcium: 7.9 mg/dL — ABNORMAL LOW (ref 8.9–10.3)
Calcium: 8.2 mg/dL — ABNORMAL LOW (ref 8.9–10.3)
Chloride: 105 mmol/L (ref 101–111)
Chloride: 106 mmol/L (ref 101–111)
Chloride: 107 mmol/L (ref 101–111)
Chloride: 111 mmol/L (ref 101–111)
Creatinine, Ser: 1.67 mg/dL — ABNORMAL HIGH (ref 0.44–1.00)
Creatinine, Ser: 1.22 mg/dL — ABNORMAL HIGH (ref 0.44–1.00)
Creatinine, Ser: 1.17 mg/dL — ABNORMAL HIGH (ref 0.44–1.00)
Creatinine, Ser: 1.13 mg/dL — ABNORMAL HIGH (ref 0.44–1.00)
GFR calc Af Amer: 43 mL/min — ABNORMAL LOW (ref >60)
GFR calc Af Amer: >60 mL/min (ref >60)
GFR calc Af Amer: >60 mL/min (ref >60)
GFR calc Af Amer: >60 mL/min (ref >60)
GFR calc non Af Amer: 37 mL/min — ABNORMAL LOW (ref >60)
GFR calc non Af Amer: 55 mL/min — ABNORMAL LOW (ref >60)
GFR calc non Af Amer: 57 mL/min — ABNORMAL LOW (ref >60)
GFR calc non Af Amer: 60 mL/min — ABNORMAL LOW (ref >60)
Glucose, Bld: 212 mg/dL — ABNORMAL HIGH (ref 65–99)
Glucose, Bld: 235 mg/dL — ABNORMAL HIGH (ref 65–99)
Glucose, Bld: 341 mg/dL — ABNORMAL HIGH (ref 65–99)
Glucose, Bld: 349 mg/dL — ABNORMAL HIGH (ref 65–99)
Potassium: 6.1 mmol/L — ABNORMAL HIGH (ref 3.5–5.1)
Potassium: 4.8 mmol/L (ref 3.5–5.1)
Potassium: 5.2 mmol/L — ABNORMAL HIGH (ref 3.5–5.1)
Potassium: 5.3 mmol/L — ABNORMAL HIGH (ref 3.5–5.1)
Sodium: 140 mmol/L (ref 135–145)
Sodium: 137 mmol/L (ref 135–145)
Sodium: 137 mmol/L (ref 135–145)
Sodium: 138 mmol/L (ref 135–145)

## 2017-09-12 LAB — BLOOD GAS, VENOUS
Acid-base deficit: 23.5 mmol/L — ABNORMAL HIGH (ref 0.0–2.0)
Bicarbonate: 7.3 mmol/L — ABNORMAL LOW (ref 20.0–28.0)
O2 Saturation: 45.4 %
Patient temperature: 98.6
pCO2, Ven: 30.1 mmHg — ABNORMAL LOW (ref 44.0–60.0)
pH, Ven: 7.013 — CL (ref 7.250–7.430)
pO2, Ven: 38 mmHg (ref 32.0–45.0)

## 2017-09-12 LAB — LACTIC ACID, PLASMA
Lactic Acid, Venous: 4.7 mmol/L (ref 0.5–1.9)
Lactic Acid, Venous: 2.6 mmol/L (ref 0.5–1.9)

## 2017-09-12 MED ORDER — CHLORHEXIDINE GLUCONATE CLOTH 2 % EX PADS
6.0000 | MEDICATED_PAD | Freq: Every day | CUTANEOUS | Status: DC
  Administered 2017-09-12: 6 via TOPICAL

## 2017-09-12 MED ORDER — LORAZEPAM 1 MG PO TABS
1.0000 mg | ORAL_TABLET | Freq: Four times a day (QID) | ORAL | Status: AC | PRN
  Administered 2017-09-13 – 2017-09-14 (×3): 1 mg via ORAL
  Filled 2017-09-12 (×3): qty 1

## 2017-09-12 MED ORDER — SODIUM CHLORIDE 0.9 % IV SOLN
INTRAVENOUS | Status: DC
  Administered 2017-09-12: 125 mL/h via INTRAVENOUS

## 2017-09-12 MED ORDER — ENOXAPARIN SODIUM 40 MG/0.4ML ~~LOC~~ SOLN
40.0000 mg | SUBCUTANEOUS | Status: DC
  Administered 2017-09-12 – 2017-09-15 (×4): 40 mg via SUBCUTANEOUS
  Filled 2017-09-12 (×5): qty 0.4

## 2017-09-12 MED ORDER — DEXTROSE-NACL 5-0.45 % IV SOLN
INTRAVENOUS | Status: DC
  Administered 2017-09-13: 03:00:00 via INTRAVENOUS

## 2017-09-12 MED ORDER — METOCLOPRAMIDE HCL 5 MG/ML IJ SOLN: 10.0000 mg | Freq: Once | INTRAMUSCULAR | Status: DC

## 2017-09-12 MED ORDER — MORPHINE SULFATE (PF) 4 MG/ML IV SOLN
1.0000 mg | INTRAVENOUS | Status: DC | PRN
  Administered 2017-09-13 – 2017-09-14 (×6): 1 mg via INTRAVENOUS
  Filled 2017-09-12 (×6): qty 1

## 2017-09-12 MED ORDER — BUSPIRONE HCL 5 MG PO TABS
15.0000 mg | ORAL_TABLET | Freq: Three times a day (TID) | ORAL | Status: DC
  Administered 2017-09-12: 15 mg via ORAL
  Filled 2017-09-12: qty 2

## 2017-09-12 MED ORDER — INSULIN ASPART 100 UNIT/ML ~~LOC~~ SOLN: 0.0000 [IU] | Freq: Every day | SUBCUTANEOUS | Status: DC

## 2017-09-12 MED ORDER — PANTOPRAZOLE SODIUM 40 MG IV SOLR
40.0000 mg | Freq: Every day | INTRAVENOUS | Status: DC
  Administered 2017-09-12 – 2017-09-15 (×4): 40 mg via INTRAVENOUS
  Filled 2017-09-12 (×4): qty 40

## 2017-09-12 MED ORDER — MIRTAZAPINE 15 MG PO TABS
30.0000 mg | ORAL_TABLET | Freq: Every day | ORAL | Status: DC
  Administered 2017-09-12 – 2017-09-14 (×3): 30 mg via ORAL
  Filled 2017-09-12 (×3): qty 2

## 2017-09-12 MED ORDER — DEXTROSE-NACL 5-0.45 % IV SOLN
INTRAVENOUS | Status: DC
  Administered 2017-09-12: 100 mL/h via INTRAVENOUS

## 2017-09-12 MED ORDER — SODIUM CHLORIDE 0.9% FLUSH: 10.0000 mL | INTRAVENOUS | Status: DC | PRN

## 2017-09-12 MED ORDER — ARIPIPRAZOLE 10 MG PO TABS
30.0000 mg | ORAL_TABLET | Freq: Every day | ORAL | Status: DC
  Administered 2017-09-12: 30 mg via ORAL
  Filled 2017-09-12: qty 3

## 2017-09-12 MED ORDER — METOCLOPRAMIDE HCL 10 MG PO TABS
10.0000 mg | ORAL_TABLET | Freq: Three times a day (TID) | ORAL | Status: DC
  Administered 2017-09-12 (×2): 10 mg via ORAL
  Filled 2017-09-12 (×2): qty 1

## 2017-09-12 MED ORDER — METOCLOPRAMIDE HCL 5 MG/ML IJ SOLN
10.0000 mg | Freq: Once | INTRAMUSCULAR | Status: AC
  Administered 2017-09-12: 10 mg via INTRAVENOUS
  Filled 2017-09-12: qty 2

## 2017-09-12 MED ORDER — STERILE WATER FOR INJECTION IV SOLN
INTRAVENOUS | Status: DC
  Administered 2017-09-12: 23:00:00 via INTRAVENOUS
  Filled 2017-09-12: qty 9.71

## 2017-09-12 MED ORDER — INSULIN GLARGINE 100 UNIT/ML ~~LOC~~ SOLN
30.0000 [IU] | Freq: Every day | SUBCUTANEOUS | Status: DC
  Administered 2017-09-12: 30 [IU] via SUBCUTANEOUS
  Filled 2017-09-12: qty 0.3

## 2017-09-12 MED ORDER — VITAMIN B-1 100 MG PO TABS
100.0000 mg | ORAL_TABLET | Freq: Every day | ORAL | Status: DC
  Administered 2017-09-12 – 2017-09-15 (×3): 100 mg via ORAL
  Filled 2017-09-12 (×3): qty 1

## 2017-09-12 MED ORDER — PANTOPRAZOLE SODIUM 40 MG PO TBEC
40.0000 mg | DELAYED_RELEASE_TABLET | Freq: Two times a day (BID) | ORAL | Status: DC
  Administered 2017-09-12: 40 mg via ORAL
  Filled 2017-09-12: qty 1

## 2017-09-12 MED ORDER — ONDANSETRON HCL 4 MG/2ML IJ SOLN
4.0000 mg | Freq: Four times a day (QID) | INTRAMUSCULAR | Status: DC | PRN
  Administered 2017-09-12 – 2017-09-13 (×3): 4 mg via INTRAVENOUS
  Filled 2017-09-12 (×3): qty 2

## 2017-09-12 MED ORDER — SODIUM CHLORIDE 0.9 % IV SOLN
INTRAVENOUS | Status: DC
  Administered 2017-09-12: 2.8 [IU]/h via INTRAVENOUS
  Administered 2017-09-13: 1.4 [IU]/h via INTRAVENOUS
  Filled 2017-09-12 (×2): qty 1

## 2017-09-12 MED ORDER — ADULT MULTIVITAMIN W/MINERALS CH
1.0000 | ORAL_TABLET | Freq: Every day | ORAL | Status: DC
  Administered 2017-09-12 – 2017-09-15 (×3): 1 via ORAL
  Filled 2017-09-12 (×4): qty 1

## 2017-09-12 MED ORDER — SODIUM CHLORIDE 0.9 % IV SOLN
INTRAVENOUS | Status: DC
  Filled 2017-09-12 (×2): qty 1

## 2017-09-12 MED ORDER — SODIUM CHLORIDE 0.9 % IV SOLN
INTRAVENOUS | Status: DC
  Administered 2017-09-12: 20:00:00 via INTRAVENOUS

## 2017-09-12 MED ORDER — METOCLOPRAMIDE HCL 5 MG/ML IJ SOLN
10.0000 mg | Freq: Once | INTRAMUSCULAR | Status: AC
  Administered 2017-09-12: 10 mg via INTRAMUSCULAR
  Filled 2017-09-12: qty 2

## 2017-09-12 MED ORDER — THIAMINE HCL 100 MG/ML IJ SOLN
100.0000 mg | Freq: Every day | INTRAMUSCULAR | Status: DC
  Administered 2017-09-13: 100 mg via INTRAVENOUS
  Filled 2017-09-12: qty 2

## 2017-09-12 MED ORDER — POTASSIUM CHLORIDE 10 MEQ/100ML IV SOLN
10.0000 meq | INTRAVENOUS | Status: DC
  Administered 2017-09-12: 10 meq via INTRAVENOUS
  Filled 2017-09-12: qty 100

## 2017-09-12 MED ORDER — ATORVASTATIN CALCIUM 40 MG PO TABS
40.0000 mg | ORAL_TABLET | Freq: Every day | ORAL | Status: DC
  Filled 2017-09-12: qty 1

## 2017-09-12 MED ORDER — FOLIC ACID 1 MG PO TABS
1.0000 mg | ORAL_TABLET | Freq: Every day | ORAL | Status: DC
  Administered 2017-09-12: 1 mg via ORAL
  Filled 2017-09-12: qty 1

## 2017-09-12 MED ORDER — GABAPENTIN 300 MG PO CAPS
900.0000 mg | ORAL_CAPSULE | Freq: Three times a day (TID) | ORAL | Status: DC
  Administered 2017-09-12: 900 mg via ORAL
  Filled 2017-09-12: qty 3

## 2017-09-12 MED ORDER — BENZTROPINE MESYLATE 0.5 MG PO TABS
0.5000 mg | ORAL_TABLET | Freq: Two times a day (BID) | ORAL | Status: DC
  Administered 2017-09-12: 0.5 mg via ORAL
  Filled 2017-09-12: qty 1

## 2017-09-12 MED ORDER — LORAZEPAM 2 MG/ML IJ SOLN
1.0000 mg | Freq: Four times a day (QID) | INTRAMUSCULAR | Status: AC | PRN
  Administered 2017-09-12 – 2017-09-15 (×5): 1 mg via INTRAVENOUS
  Filled 2017-09-12 (×5): qty 1

## 2017-09-12 MED ORDER — INSULIN ASPART 100 UNIT/ML ~~LOC~~ SOLN
0.0000 [IU] | Freq: Three times a day (TID) | SUBCUTANEOUS | Status: DC
  Administered 2017-09-12: 8 [IU] via SUBCUTANEOUS

## 2017-09-12 NOTE — Progress Notes (Signed)
Inpatient Diabetes Program Recommendations  AACE/ADA: New Consensus Statement on Inpatient Glycemic Control (2015)  Target Ranges:  Prepandial:   less than 140 mg/dL      Peak postprandial:   less than 180 mg/dL (1-2 hours)      Critically ill patients:  140 - 180 mg/dL   Lab Results  Component Value Date   GLUCAP 266 (H) 09/12/2017   HGBA1C 11.6 (H) 08/04/2017    Review of Glycemic Control  Diabetes history: DM2 Outpatient Diabetes medications: Lsantus 25 units QD, Novolog 12 units tidwc Current orders for Inpatient glycemic control: Lantus 30 units QD, Novolog 0-15 units tidwc and hs  HgbA1C - 11.6% - uncontrolled CO2 - 9 AG - 22  Inpatient Diabetes Program Recommendations:     Restart IV insulin per DKA protocol.  Spoke with MD.  Thank you. Ailene Ardshonda Whalen Trompeter, RD, LDN, CDE Inpatient Diabetes Coordinator [phone]

## 2017-09-12 NOTE — ED Notes (Signed)
Lactic acid 4.7 

## 2017-09-12 NOTE — Progress Notes (Signed)
Pt actively vomiting. Not time for anther dose of zofrn. Kirtland BouchardK. Schorr paged.. New order given for reglan. Derinda SisVera Melrose Kearse,rn.

## 2017-09-12 NOTE — ED Notes (Signed)
Report called to floor RN

## 2017-09-12 NOTE — H&P (Signed)
History and Physical    Carolyn Ayala RUE:[id number] DOB: [birth year] DOA: 09/11/2017  PCP: Patient, No Pcp Per  Patient coming from: Home  I have personally briefly reviewed patient's old medical records in [facility]  Chief Complaint: Emesis  HPI: [name] is a 42 y.o. female with medical history significant of IDDM, EtOH abuse, prior non-compliance with therapy.  Patient presents to the ED with c/o N/V and abd pain.  States she didn't feel well yesterday.  Endorses fatigue, nausea, 25 episodes of vomiting thus far today after 6AM.  Took no insulin yesterday due to nausea and not eating yesterday.   ED Course: DKA with pH 7.01.  AG 29, 80 keytones in urine.   Review of Systems: As per HPI otherwise 10 point review of systems negative.   Past Medical History:  Diagnosis Date  . Depression   . Diabetes mellitus without complication (HCC)   . High cholesterol   . Hypertension   . Mental disorder   . Noncompliance     Past Surgical History:  Procedure Laterality Date  . CESAREAN SECTION       reports that she has been smoking.  She has been smoking about 1.00 pack per day. She uses smokeless tobacco. She reports that she drinks alcohol. She reports that she has current or past drug history. Drugs: Barbituates and Benzodiazepines. Frequency: 7.00 times per week.  No Known Allergies  Family History  Problem Relation Age of Onset  . Depression Mother      Prior to Admission medications   Medication Sig Start Date End Date Taking? Authorizing Provider  ARIPiprazole (ABILIFY) 10 MG tablet Take 1 tablet (10 mg total) by mouth daily. For mood control Patient taking differently: Take 30 mg by mouth daily. For mood control 08/08/17  Yes Nwoko, Nicole Kindred I, NP  atorvastatin (LIPITOR) 40 MG tablet Take 40 mg by mouth daily.   Yes [provider]  benztropine (COGENTIN) 0.5 MG tablet Take 1 tablet (0.5 mg total) by mouth 2 (two) times daily. For  prevention of drug induced tremors 08/07/17  Yes Nwoko, Nicole Kindred I, NP  busPIRone (BUSPAR) 15 MG tablet Take 1 tablet (15 mg total) by mouth 3 (three) times daily. For anxiety 08/07/17  Yes Armandina Stammer I, NP  gabapentin (NEURONTIN) 300 MG capsule Take 2 capsules (600 mg total) by mouth 3 (three) times daily. For agitation/diabetic neuropathic Patient taking differently: Take 900 mg by mouth 3 (three) times daily. For agitation/diabetic neuropathic 08/07/17  Yes Armandina Stammer I, NP  insulin aspart (NOVOLOG) 100 UNIT/ML injection Inject 12 Units into the skin 3 (three) times daily before meals. 09/07/17  Yes Garlon Hatchet, PA-C  insulin glargine (LANTUS) 100 UNIT/ML injection Inject 0.6 mLs (60 Units total) into the skin 2 (two) times daily. Patient taking differently: Inject 25 Units into the skin daily.  09/07/17  Yes Garlon Hatchet, PA-C  lisinopril (PRINIVIL,ZESTRIL) 10 MG tablet Take 1 tablet (10 mg total) by mouth daily. For high blood pressure 08/08/17  Yes Nwoko, Nicole Kindred I, NP  metoCLOPramide (REGLAN) 10 MG tablet Take 1 tablet (10 mg total) by mouth 4 (four) times daily -  before meals and at bedtime. For nausea 08/07/17  Yes Armandina Stammer I, NP  mirtazapine (REMERON) 15 MG tablet Take 1 tablet (15 mg total) by mouth at bedtime. For depression/sleep Patient taking differently: Take 30 mg by mouth at bedtime. For depression/sleep 08/07/17  Yes Sanjuana Kava, NP  pantoprazole (PROTONIX)  40 MG tablet Take 1 tablet (40 mg total) by mouth 2 (two) times daily with a meal. For acid reflux 08/07/17  Yes Armandina StammerNwoko, Agnes I, NP    Physical Exam: Vitals:   09/11/17 1821 09/11/17 2155 09/11/17 2200 09/12/17 0012  BP: (!) 142/88 (!) 143/88 140/85 121/70  Pulse: (!) 129 (!) 135 (!) 133 (!) 139  Resp: 16 (!) 27 (!) 28 (!) 27  Temp: 98.2 F (36.8 C) 97.7 F (36.5 C)    TempSrc: Oral Oral    SpO2: 95% 100% 100% 100%  Weight: 86.2 kg (190 lb)     Height: 5\' 2"  (1.575 m)       Constitutional: NAD, calm,  comfortable Eyes: PERRL, lids and conjunctivae normal ENMT: Mucous membranes are moist. Posterior pharynx clear of any exudate or lesions.Normal dentition.  Neck: normal, supple, no masses, no thyromegaly Respiratory: clear to auscultation bilaterally, no wheezing, no crackles. Normal respiratory effort. No accessory muscle use.  Cardiovascular: Regular rate and rhythm, no murmurs / rubs / gallops. No extremity edema. 2+ pedal pulses. No carotid bruits.  Abdomen: no tenderness, no masses palpated. No hepatosplenomegaly. Bowel sounds positive.  Musculoskeletal: no clubbing / cyanosis. No joint deformity upper and lower extremities. Good ROM, no contractures. Normal muscle tone.  Skin: no rashes, lesions, ulcers. No induration Neurologic: CN 2-12 grossly intact. Sensation intact, DTR normal. Strength 5/5 in all 4.  Psychiatric: Normal judgment and insight. Alert and oriented x 3. Normal mood.    Labs on Admission: I have personally reviewed following labs and imaging studies  CBC: Recent Labs  Lab 09/06/17 2355 09/11/17 2019  WBC 4.9 20.7*  HGB 11.0* 13.1  HCT 34.7* 41.2  MCV 78.3 81.9  PLT 328 421*   Basic Metabolic Panel: Recent Labs  Lab 09/06/17 2355 09/11/17 2019  NA 132* 134*  K 4.2 5.0  CL 98* 98*  CO2 19* 7*  GLUCOSE 614* 487*  BUN 13 19  CREATININE 0.82 1.11*  CALCIUM 8.9 9.7   GFR: Estimated Creatinine Clearance: 68.6 mL/min (A) (by C-G formula based on SCr of 1.11 mg/dL (H)). Liver Function Tests: Recent Labs  Lab 09/11/17 2019  AST 33  ALT 52  ALKPHOS 143*  BILITOT 1.5*  PROT 8.4*  ALBUMIN 4.4   Recent Labs  Lab 09/11/17 2019  LIPASE 19   No results for input(s): AMMONIA in the last 168 hours. Coagulation Profile: No results for input(s): INR, PROTIME in the last 168 hours. Cardiac Enzymes: No results for input(s): CKTOTAL, CKMB, CKMBINDEX, TROPONINI in the last 168 hours. BNP (last 3 results) No results for input(s): PROBNP in the last 8760  hours. HbA1C: No results for input(s): HGBA1C in the last 72 hours. CBG: Recent Labs  Lab 09/07/17 0001 09/07/17 0308 09/07/17 0357 09/07/17 0439 09/12/17 0035  GLUCAP 555* 382* 343* 260* >600*   Lipid Profile: No results for input(s): CHOL, HDL, LDLCALC, TRIG, CHOLHDL, LDLDIRECT in the last 72 hours. Thyroid Function Tests: No results for input(s): TSH, T4TOTAL, FREET4, T3FREE, THYROIDAB in the last 72 hours. Anemia Panel: No results for input(s): VITAMINB12, FOLATE, FERRITIN, TIBC, IRON, RETICCTPCT in the last 72 hours. Urine analysis:    Component Value Date/Time   COLORURINE YELLOW 09/11/2017 2133   APPEARANCEUR CLEAR 09/11/2017 2133   LABSPEC 1.017 09/11/2017 2133   PHURINE 5.0 09/11/2017 2133   GLUCOSEU >=500 (A) 09/11/2017 2133   HGBUR SMALL (A) 09/11/2017 2133   BILIRUBINUR NEGATIVE 09/11/2017 2133   KETONESUR 80 (A) 09/11/2017 2133  PROTEINUR 30 (A) 09/11/2017 2133   NITRITE NEGATIVE 09/11/2017 2133   LEUKOCYTESUR NEGATIVE 09/11/2017 2133    Radiological Exams on Admission: No results found.  EKG: Independently reviewed.  Assessment/Plan Principal Problem:   DKA (diabetic ketoacidoses) (HCC) Active Problems:   IDDM (insulin dependent diabetes mellitus) (HCC)   Generalized anxiety disorder   Depression, major, recurrent, severe with psychosis (HCC)   Alcohol use disorder, moderate, dependence (HCC)    1. DKA - 1. DKA pathway 2. IVF 3. BMP Q4H 4. Insulin gtt 2. H/o EtOH abuse - 1. CIWA 3. Depression and anxiety - cont home meds  DVT prophylaxis: Lovenox Code Status: Full Family Communication: Family at bedside Disposition Plan: Home after admit Consults called: None Admission status: Admit to inpatient - IP status for DKA treatment   Hillary Bow DO [facility] Pager [phone]  If 7AM-7PM, please contact day team taking care of patient [email or web address] Password [email or web address]  09/12/2017, 1:43 AM

## 2017-09-12 NOTE — ED Notes (Signed)
Lactic acid 2.6 

## 2017-09-12 NOTE — Progress Notes (Signed)
Patient is a 42 year old female with past medical history of insulin-dependent diabetes mellitus, ethanol abuse, noncompliance who presented to the emergency department with complaints of nausea, vomiting and abdominal pain.  Patient did not feel well at home and she missed her insulin.  She was reported to be very weak, nauseated and vomited multiple times.  She had significant loss of appetite. Patient was found to be in DKA and was admitted for further management. Her pH was 7.01 with anion gap of 29 on presentation.  She has been started on insulin drip and IV fluids. Patient seen and examined the bedside in the emergency department.  She still feels very weak and was found to be drowsy.  She was also found to be in sinus tachycardia. Patient will be moved to stepdown unit.  Continue to monitor the patient.  Will follow BMP every 4 hours.

## 2017-09-12 NOTE — Progress Notes (Signed)
Patient had 4 runs of K+ ordered patients K+ was 5.2. MD paged and discussed, stopped K+ that was currently running.

## 2017-09-13 LAB — GLUCOSE, CAPILLARY
Glucose-Capillary: 119 mg/dL — ABNORMAL HIGH (ref 65–99)
Glucose-Capillary: 126 mg/dL — ABNORMAL HIGH (ref 65–99)
Glucose-Capillary: 127 mg/dL — ABNORMAL HIGH (ref 65–99)
Glucose-Capillary: 139 mg/dL — ABNORMAL HIGH (ref 65–99)
Glucose-Capillary: 148 mg/dL — ABNORMAL HIGH (ref 65–99)
Glucose-Capillary: 149 mg/dL — ABNORMAL HIGH (ref 65–99)
Glucose-Capillary: 151 mg/dL — ABNORMAL HIGH (ref 65–99)
Glucose-Capillary: 153 mg/dL — ABNORMAL HIGH (ref 65–99)
Glucose-Capillary: 155 mg/dL — ABNORMAL HIGH (ref 65–99)
Glucose-Capillary: 174 mg/dL — ABNORMAL HIGH (ref 65–99)
Glucose-Capillary: 200 mg/dL — ABNORMAL HIGH (ref 65–99)
Glucose-Capillary: 201 mg/dL — ABNORMAL HIGH (ref 65–99)
Glucose-Capillary: 213 mg/dL — ABNORMAL HIGH (ref 65–99)
Glucose-Capillary: 64 mg/dL — ABNORMAL LOW (ref 65–99)
Glucose-Capillary: 87 mg/dL (ref 65–99)

## 2017-09-13 LAB — BASIC METABOLIC PANEL
Anion gap: 21 — ABNORMAL HIGH (ref 5–15)
Anion gap: 17 — ABNORMAL HIGH (ref 5–15)
Anion gap: 10 (ref 5–15)
Anion gap: 10 (ref 5–15)
BUN: 15 mg/dL (ref 6–20)
BUN: 12 mg/dL (ref 6–20)
BUN: 9 mg/dL (ref 6–20)
BUN: 6 mg/dL (ref 6–20)
CO2: 9 mmol/L — ABNORMAL LOW (ref 22–32)
CO2: 14 mmol/L — ABNORMAL LOW (ref 22–32)
CO2: 15 mmol/L — ABNORMAL LOW (ref 22–32)
CO2: 19 mmol/L — ABNORMAL LOW (ref 22–32)
Calcium: 8.5 mg/dL — ABNORMAL LOW (ref 8.9–10.3)
Calcium: 8.7 mg/dL — ABNORMAL LOW (ref 8.9–10.3)
Calcium: 8 mg/dL — ABNORMAL LOW (ref 8.9–10.3)
Calcium: 7.8 mg/dL — ABNORMAL LOW (ref 8.9–10.3)
Chloride: 112 mmol/L — ABNORMAL HIGH (ref 101–111)
Chloride: 115 mmol/L — ABNORMAL HIGH (ref 101–111)
Chloride: 117 mmol/L — ABNORMAL HIGH (ref 101–111)
Chloride: 111 mmol/L (ref 101–111)
Creatinine, Ser: 1 mg/dL (ref 0.44–1.00)
Creatinine, Ser: 0.9 mg/dL (ref 0.44–1.00)
Creatinine, Ser: 0.86 mg/dL (ref 0.44–1.00)
Creatinine, Ser: 0.67 mg/dL (ref 0.44–1.00)
GFR calc Af Amer: >60 mL/min (ref >60)
GFR calc Af Amer: >60 mL/min (ref >60)
GFR calc Af Amer: >60 mL/min (ref >60)
GFR calc Af Amer: >60 mL/min (ref >60)
GFR calc non Af Amer: >60 mL/min (ref >60)
GFR calc non Af Amer: >60 mL/min (ref >60)
GFR calc non Af Amer: >60 mL/min (ref >60)
GFR calc non Af Amer: >60 mL/min (ref >60)
Glucose, Bld: 229 mg/dL — ABNORMAL HIGH (ref 65–99)
Glucose, Bld: 86 mg/dL (ref 65–99)
Glucose, Bld: 155 mg/dL — ABNORMAL HIGH (ref 65–99)
Glucose, Bld: 112 mg/dL — ABNORMAL HIGH (ref 65–99)
Potassium: 4.5 mmol/L (ref 3.5–5.1)
Potassium: 4.1 mmol/L (ref 3.5–5.1)
Potassium: 3.5 mmol/L (ref 3.5–5.1)
Potassium: 3.2 mmol/L — ABNORMAL LOW (ref 3.5–5.1)
Sodium: 142 mmol/L (ref 135–145)
Sodium: 146 mmol/L — ABNORMAL HIGH (ref 135–145)
Sodium: 142 mmol/L (ref 135–145)
Sodium: 140 mmol/L (ref 135–145)

## 2017-09-13 LAB — HIV ANTIBODY (ROUTINE TESTING W REFLEX): HIV Screen 4th Generation wRfx: NONREACTIVE

## 2017-09-13 LAB — LACTIC ACID, PLASMA: Lactic Acid, Venous: 2.1 mmol/L (ref 0.5–1.9)

## 2017-09-13 MED ORDER — SODIUM CHLORIDE 0.9 % IV SOLN
INTRAVENOUS | Status: DC
  Administered 2017-09-13 – 2017-09-15 (×5): via INTRAVENOUS

## 2017-09-13 MED ORDER — PROMETHAZINE HCL 25 MG/ML IJ SOLN
12.5000 mg | Freq: Four times a day (QID) | INTRAMUSCULAR | Status: DC | PRN
Start: 2017-09-13 — End: 2017-09-14
  Administered 2017-09-13: 12.5 mg via INTRAVENOUS
  Filled 2017-09-13: qty 1

## 2017-09-13 MED ORDER — DEXTROSE 50 % IV SOLN
INTRAVENOUS | Status: AC
  Filled 2017-09-13: qty 50

## 2017-09-13 MED ORDER — DEXTROSE 10 % IV SOLN
INTRAVENOUS | Status: DC
  Administered 2017-09-13: 05:00:00 via INTRAVENOUS
  Filled 2017-09-13 (×2): qty 1000

## 2017-09-13 MED ORDER — SODIUM CHLORIDE 0.9 % IV BOLUS
1000.0000 mL | Freq: Once | INTRAVENOUS | Status: AC
  Administered 2017-09-13: 1000 mL via INTRAVENOUS

## 2017-09-13 MED ORDER — INSULIN ASPART 100 UNIT/ML ~~LOC~~ SOLN: 0.0000 [IU] | Freq: Three times a day (TID) | SUBCUTANEOUS | Status: DC

## 2017-09-13 MED ORDER — INSULIN ASPART 100 UNIT/ML ~~LOC~~ SOLN
0.0000 [IU] | Freq: Three times a day (TID) | SUBCUTANEOUS | Status: DC
  Administered 2017-09-13 (×2): 3 [IU] via SUBCUTANEOUS
  Administered 2017-09-14: 8 [IU] via SUBCUTANEOUS
  Administered 2017-09-14 – 2017-09-15 (×2): 5 [IU] via SUBCUTANEOUS
  Administered 2017-09-15: 3 [IU] via SUBCUTANEOUS

## 2017-09-13 MED ORDER — INSULIN ASPART 100 UNIT/ML ~~LOC~~ SOLN
6.0000 [IU] | Freq: Three times a day (TID) | SUBCUTANEOUS | Status: DC
  Administered 2017-09-13 – 2017-09-14 (×3): 6 [IU] via SUBCUTANEOUS

## 2017-09-13 MED ORDER — INSULIN ASPART 100 UNIT/ML ~~LOC~~ SOLN: 0.0000 [IU] | Freq: Every day | SUBCUTANEOUS | Status: DC

## 2017-09-13 MED ORDER — DEXTROSE 50 % IV SOLN
INTRAVENOUS | Status: AC
  Administered 2017-09-13: 50 mL
  Filled 2017-09-13: qty 50

## 2017-09-13 MED ORDER — LISINOPRIL 10 MG PO TABS
10.0000 mg | ORAL_TABLET | Freq: Every day | ORAL | Status: DC
  Administered 2017-09-13 – 2017-09-15 (×3): 10 mg via ORAL
  Filled 2017-09-13 (×3): qty 1

## 2017-09-13 MED ORDER — INSULIN GLARGINE 100 UNIT/ML ~~LOC~~ SOLN
30.0000 [IU] | Freq: Every day | SUBCUTANEOUS | Status: DC
  Administered 2017-09-13 – 2017-09-14 (×2): 30 [IU] via SUBCUTANEOUS
  Filled 2017-09-13 (×2): qty 0.3

## 2017-09-13 MED ORDER — SODIUM CHLORIDE 0.9 % IV BOLUS
1000.0000 mL | Freq: Once | INTRAVENOUS | Status: AC
Start: 2017-09-13 — End: 2017-09-13
  Administered 2017-09-13: 1000 mL via INTRAVENOUS

## 2017-09-13 MED ORDER — STERILE WATER FOR INJECTION IV SOLN
INTRAVENOUS | Status: AC
  Administered 2017-09-13: 17:00:00 via INTRAVENOUS
  Filled 2017-09-13: qty 9.71

## 2017-09-13 MED ORDER — METOCLOPRAMIDE HCL 5 MG/ML IJ SOLN
10.0000 mg | Freq: Four times a day (QID) | INTRAMUSCULAR | Status: DC | PRN
  Administered 2017-09-13 – 2017-09-14 (×3): 10 mg via INTRAVENOUS
  Filled 2017-09-13 (×3): qty 2

## 2017-09-13 MED ORDER — INSULIN ASPART 100 UNIT/ML ~~LOC~~ SOLN
0.0000 [IU] | Freq: Every day | SUBCUTANEOUS | Status: DC
  Administered 2017-09-13: 2 [IU] via SUBCUTANEOUS

## 2017-09-13 NOTE — Progress Notes (Signed)
Inpatient Diabetes Program Recommendations  AACE/ADA: New Consensus Statement on Inpatient Glycemic Control (2015)  Target Ranges:  Prepandial:   less than 140 mg/dL      Peak postprandial:   less than 180 mg/dL (1-2 hours)      Critically ill patients:  140 - 180 mg/dL   Lab Results  Component Value Date   GLUCAP 148 (H) 09/13/2017   HGBA1C 11.6 (H) 08/04/2017    Review of Glycemic Control  AG - 10 CO2 - 15  Not ready for transition yet.    Inpatient Diabetes Program Recommendations:   When criteria met for transitioning to SQ insulin, give Lantus 25 units Q24H. Novolog 6 units tidwc for meal coverage insulin Novolog 0-20 units tidwc and hs  Continue to follow.  Thank you. Lorenda Peck, RD, LDN, CDE Inpatient Diabetes Coordinator [phone]

## 2017-09-13 NOTE — Progress Notes (Addendum)
PROGRESS NOTE    Carolyn Ayala  ZOX:[id number] DOB: Oct 16, 1975 DOA: 09/11/2017 PCP: Patient, No Pcp Per   Brief Narrative:  Patient is a 42 year old female with past medical history of insulin-dependent diabetes mellitus, ethanol abuse, noncompliance who presented to the emergency department with complaints of nausea, vomiting and abdominal pain.  Patient did not feel well at home and she missed her insulin.  She was reported to be very weak, nauseated and vomited multiple times.  She had significant loss of appetite. Patient was found to be in DKA and was admitted for further management. Her pH was 7.01 with anion gap of 29 on presentation.  She was started on insulin drip and IV fluids.Also started on bicarb due to severe acidosis. Currently the gap is closed.  She has been started on long-acting and short-acting insulins.  Assessment & Plan:   Principal Problem:   DKA (diabetic ketoacidoses) (HCC) Active Problems:   IDDM (insulin dependent diabetes mellitus) (HCC)   Generalized anxiety disorder   Depression, major, recurrent, severe with psychosis (HCC)   Alcohol use disorder, moderate, dependence (HCC)  DKA: Presented with severe acidosis, elevated anion gap and hyperglycemia started on insulin drip on presentation which has been stopped now.  Gap is closed. Patient reported to be sick at home and missed her insulin dose. Continue long-acting and coverage insulin for now.  Patient has been started on clear liquid diet .We will advance diet Diabetic coordinator following.    Abdomen pain, nausea, vomiting: The symptoms are likely from DKA.  The symptoms have improved.  Continue Protonix, Reglan and IV fluids.  Sinus tachycardia: Improving.  Continue IV fluids.  Insulin-dependent diabetes mellitus: Continue insulin as above .Patient is on insulin at home.  History of ethanol abuse: Start on CIWA.  Not on withdrawal at the moment.  History of anxiety/depression: We will  resume her home meds after further improvement in her mental status.  History of hypertension: Currently slightly hypertensive.  Will resume home medication lisinopril.  DVT prophylaxis: Lovenox Code Status: Full Family Communication: None present at the bedside Disposition Plan: Home after resolution of hyperglycemia   Consultants: None  Procedures: None  Antimicrobials: None  Subjective: Patient seen and examined the bedside this morning.  Gap has closed this morning.  Insulin drip discontinued.  She looks more alert and responsive today.  Complains of nausea and mild abdominal pain  Objective: Vitals:   09/13/17 1000 09/13/17 1100 09/13/17 1138 09/13/17 1200  BP: (!) 146/73 (!) 165/67  (!) 150/75  Pulse:      Resp: 20 18  19   Temp:   98.5 F (36.9 C)   TempSrc:   Axillary   SpO2:      Weight:      Height:        Intake/Output Summary (Last 24 hours) at 09/13/2017 1221 Last data filed at 09/13/2017 1204 Gross per 24 hour  Intake 1098.28 ml  Output 1000 ml  Net 98.28 ml   Filed Weights   09/11/17 1821 09/12/17 1308 09/13/17 0640  Weight: 86.2 kg (190 lb) 90.7 kg (199 lb 15.3 oz) 95 kg (209 lb 7 oz)    Examination:  General exam: Appears calm and comfortable ,generalized weakness,average built,obese HEENT:PERRL,Oral mucosa moist, Ear/Nose normal on gross exam Respiratory system: Bilateral equal air entry, normal vesicular breath sounds, no wheezes or crackles  Cardiovascular system: S1 & S2 heard, RRR. No JVD, murmurs, rubs, gallops or clicks. No pedal edema. Gastrointestinal system: Abdomen is nondistended, soft  and nontender. No organomegaly or masses felt. Normal bowel sounds heard. Central nervous system: Alert and oriented. No focal neurological deficits. Extremities: No edema, no clubbing ,no cyanosis, distal peripheral pulses palpable. Skin: No rashes, lesions or ulcers,no icterus ,no pallor MSK: Normal muscle bulk,tone ,power Psychiatry: Judgement and  insight appear normal. Mood & affect appropriate.     Data Reviewed: I have personally reviewed following labs and imaging studies  CBC: Recent Labs  Lab 09/06/17 2355 09/11/17 2019  WBC 4.9 20.7*  HGB 11.0* 13.1  HCT 34.7* 41.2  MCV 78.3 81.9  PLT 328 421*   Basic Metabolic Panel: Recent Labs  Lab 09/12/17 1346 09/12/17 1715 09/13/17 0044 09/13/17 0451 09/13/17 0836  NA 137 138 142 146* 142  K 5.2* 5.3* 4.5 4.1 3.5  CL 107 111 112* 115* 117*  CO2 7* <7* 9* 14* 15*  GLUCOSE 341* 349* 229* 86 155*  BUN 22* 19 15 12 9   CREATININE 1.17* 1.13* 1.00 0.90 0.86  CALCIUM 7.9* 8.2* 8.5* 8.7* 8.0*   GFR: Estimated Creatinine Clearance: 93.5 mL/min (by C-G formula based on SCr of 0.86 mg/dL). Liver Function Tests: Recent Labs  Lab 09/11/17 2019  AST 33  ALT 52  ALKPHOS 143*  BILITOT 1.5*  PROT 8.4*  ALBUMIN 4.4   Recent Labs  Lab 09/11/17 2019  LIPASE 19   No results for input(s): AMMONIA in the last 168 hours. Coagulation Profile: No results for input(s): INR, PROTIME in the last 168 hours. Cardiac Enzymes: No results for input(s): CKTOTAL, CKMB, CKMBINDEX, TROPONINI in the last 168 hours. BNP (last 3 results) No results for input(s): PROBNP in the last 8760 hours. HbA1C: No results for input(s): HGBA1C in the last 72 hours. CBG: Recent Labs  Lab 09/13/17 0727 09/13/17 0834 09/13/17 0954 09/13/17 1058 09/13/17 1202  GLUCAP 155* 148* 126* 139* 151*   Lipid Profile: No results for input(s): CHOL, HDL, LDLCALC, TRIG, CHOLHDL, LDLDIRECT in the last 72 hours. Thyroid Function Tests: No results for input(s): TSH, T4TOTAL, FREET4, T3FREE, THYROIDAB in the last 72 hours. Anemia Panel: No results for input(s): VITAMINB12, FOLATE, FERRITIN, TIBC, IRON, RETICCTPCT in the last 72 hours. Sepsis Labs: Recent Labs  Lab 09/12/17 0648 09/12/17 1115 09/13/17 0451  LATICACIDVEN 4.7* 2.6* 2.1*    No results found for this or any previous visit (from the past  240 hour(s)).       Radiology Studies: No results found.      Scheduled Meds: . Chlorhexidine Gluconate Cloth  6 each Topical Daily  . enoxaparin (LOVENOX) injection  40 mg Subcutaneous Q24H  . insulin aspart  0-20 Units Subcutaneous QHS  . insulin aspart  0-20 Units Subcutaneous TID WC  . insulin aspart  6 Units Subcutaneous TID WC  . insulin glargine  30 Units Subcutaneous Daily  . mirtazapine  30 mg Oral QHS  . multivitamin with minerals  1 tablet Oral Daily  . pantoprazole (PROTONIX) IV  40 mg Intravenous Daily  . thiamine  100 mg Oral Daily   Or  . thiamine  100 mg Intravenous Daily   Continuous Infusions: . sodium chloride    .  sodium bicarbonate infusion 1/4 NS 1000 mL 75 mL/hr at 09/13/17 1046     LOS: 1 day    Time spent: 35 mins.More than 50% of that time was spent in counseling and/or coordination of care.      Burnadette Pop, MD [facility] Pager [phone]  If 7PM-7AM, please contact night-coverage [email or web address] Password  [email or web address] 09/13/2017, 12:21 PM

## 2017-09-14 LAB — BASIC METABOLIC PANEL
Anion gap: 9 (ref 5–15)
BUN: 5 mg/dL — ABNORMAL LOW (ref 6–20)
CO2: 23 mmol/L (ref 22–32)
Calcium: 7.9 mg/dL — ABNORMAL LOW (ref 8.9–10.3)
Chloride: 109 mmol/L (ref 101–111)
Creatinine, Ser: 0.66 mg/dL (ref 0.44–1.00)
GFR calc Af Amer: >60 mL/min (ref >60)
GFR calc non Af Amer: >60 mL/min (ref >60)
Glucose, Bld: 325 mg/dL — ABNORMAL HIGH (ref 65–99)
Potassium: 2.9 mmol/L — ABNORMAL LOW (ref 3.5–5.1)
Sodium: 141 mmol/L (ref 135–145)

## 2017-09-14 LAB — GLUCOSE, CAPILLARY
Glucose-Capillary: 312 mg/dL — ABNORMAL HIGH (ref 65–99)
Glucose-Capillary: 261 mg/dL — ABNORMAL HIGH (ref 65–99)
Glucose-Capillary: 110 mg/dL — ABNORMAL HIGH (ref 65–99)
Glucose-Capillary: 165 mg/dL — ABNORMAL HIGH (ref 65–99)

## 2017-09-14 LAB — MAGNESIUM: Magnesium: 1.9 mg/dL (ref 1.7–2.4)

## 2017-09-14 MED ORDER — INSULIN GLARGINE 100 UNIT/ML ~~LOC~~ SOLN
40.0000 [IU] | Freq: Two times a day (BID) | SUBCUTANEOUS | Status: DC
  Administered 2017-09-14 – 2017-09-15 (×2): 40 [IU] via SUBCUTANEOUS
  Filled 2017-09-14 (×3): qty 0.4

## 2017-09-14 MED ORDER — POTASSIUM CHLORIDE 10 MEQ/100ML IV SOLN
10.0000 meq | INTRAVENOUS | Status: AC
  Administered 2017-09-14 (×5): 10 meq via INTRAVENOUS
  Filled 2017-09-14: qty 100

## 2017-09-14 MED ORDER — BUSPIRONE HCL 5 MG PO TABS
15.0000 mg | ORAL_TABLET | Freq: Three times a day (TID) | ORAL | Status: DC
  Administered 2017-09-14 – 2017-09-15 (×2): 15 mg via ORAL
  Filled 2017-09-14 (×2): qty 1

## 2017-09-14 MED ORDER — POTASSIUM CHLORIDE CRYS ER 20 MEQ PO TBCR
40.0000 meq | EXTENDED_RELEASE_TABLET | Freq: Once | ORAL | Status: AC
  Administered 2017-09-14: 40 meq via ORAL
  Filled 2017-09-14: qty 2

## 2017-09-14 MED ORDER — PROMETHAZINE HCL 25 MG/ML IJ SOLN
12.5000 mg | Freq: Four times a day (QID) | INTRAMUSCULAR | Status: AC | PRN
  Administered 2017-09-14: 12.5 mg via INTRAVENOUS
  Filled 2017-09-14: qty 1

## 2017-09-14 MED ORDER — METOCLOPRAMIDE HCL 5 MG/ML IJ SOLN
10.0000 mg | Freq: Four times a day (QID) | INTRAMUSCULAR | Status: DC | PRN
Start: 2017-09-14 — End: 2017-09-15
  Administered 2017-09-14 – 2017-09-15 (×2): 10 mg via INTRAVENOUS
  Filled 2017-09-14 (×2): qty 2

## 2017-09-14 MED ORDER — BENZTROPINE MESYLATE 0.5 MG PO TABS
0.5000 mg | ORAL_TABLET | Freq: Two times a day (BID) | ORAL | Status: DC
  Administered 2017-09-14 – 2017-09-15 (×2): 0.5 mg via ORAL
  Filled 2017-09-14 (×2): qty 1

## 2017-09-14 MED ORDER — INSULIN ASPART 100 UNIT/ML ~~LOC~~ SOLN
8.0000 [IU] | Freq: Three times a day (TID) | SUBCUTANEOUS | Status: DC
  Administered 2017-09-15 (×2): 8 [IU] via SUBCUTANEOUS

## 2017-09-14 MED ORDER — AMLODIPINE BESYLATE 10 MG PO TABS
10.0000 mg | ORAL_TABLET | Freq: Every day | ORAL | Status: DC
  Administered 2017-09-14 – 2017-09-15 (×2): 10 mg via ORAL
  Filled 2017-09-14 (×2): qty 1

## 2017-09-14 MED ORDER — ARIPIPRAZOLE 10 MG PO TABS
30.0000 mg | ORAL_TABLET | Freq: Every day | ORAL | Status: DC
  Administered 2017-09-15: 30 mg via ORAL
  Filled 2017-09-14: qty 3

## 2017-09-14 MED ORDER — ONDANSETRON HCL 4 MG/2ML IJ SOLN
4.0000 mg | Freq: Four times a day (QID) | INTRAMUSCULAR | Status: DC | PRN
  Administered 2017-09-14: 4 mg via INTRAVENOUS
  Filled 2017-09-14: qty 2

## 2017-09-14 MED ORDER — MORPHINE SULFATE (PF) 4 MG/ML IV SOLN
2.0000 mg | INTRAVENOUS | Status: DC | PRN
  Administered 2017-09-14 – 2017-09-15 (×4): 2 mg via INTRAVENOUS
  Filled 2017-09-14 (×4): qty 1

## 2017-09-14 NOTE — Progress Notes (Signed)
Patient currently refusing to let RN scan bracelet to give medications and to have vital signs taken on her. System overrided so medications could be given. Will continue to monitor.

## 2017-09-14 NOTE — Progress Notes (Signed)
After talk with MD and leadership, patient has agreed to some nursing care and an assessment for now. Will continue to monitor.

## 2017-09-14 NOTE — Progress Notes (Signed)
Inpatient Diabetes Program Recommendations  AACE/ADA: New Consensus Statement on Inpatient Glycemic Control (2015)  Target Ranges:  Prepandial:   less than 140 mg/dL      Peak postprandial:   less than 180 mg/dL (1-2 hours)      Critically ill patients:  140 - 180 mg/dL   Lab Results  Component Value Date   GLUCAP 261 (H) 09/14/2017   HGBA1C 11.6 (H) 08/04/2017    Review of Glycemic Control  FBS - 325 mg/dL Adjust Lantus    Inpatient Diabetes Program Recommendations:     Increase Lantus to 35 units QD Increase meal coverage insulin to 8 units tidwc  Continue to follow.   Thank you. Ailene Ardshonda Marga Gramajo, RD, LDN, CDE Inpatient Diabetes Coordinator [phone]

## 2017-09-14 NOTE — Progress Notes (Addendum)
PROGRESS NOTE    Carolyn Ayala  ZOX:[id number] DOB: 14-Jul-1975 DOA: 09/11/2017 PCP: Patient, No Pcp Per   Brief Narrative:  Patient is a 42 year old female with past medical history of insulin-dependent diabetes mellitus, ethanol abuse, noncompliance who presented to the emergency department with complaints of nausea, vomiting and abdominal pain.  Patient did not feel well at home and she missed her insulin.  She was reported to be very weak, nauseated and vomited multiple times.  She had significant loss of appetite. Patient was found to be in DKA and was admitted for further management. Her pH was 7.01 with anion gap of 29 on presentation.  She was started on insulin drip and IV fluids.Also started on bicarb due to severe acidosis. Currently the gap is closed.  She has been started on long-acting and short-acting insulins.  Assessment & Plan:   Principal Problem:   DKA (diabetic ketoacidoses) (HCC) Active Problems:   IDDM (insulin dependent diabetes mellitus) (HCC)   Generalized anxiety disorder   Depression, major, recurrent, severe with psychosis (HCC)   Alcohol use disorder, moderate, dependence (HCC)  DKA: Presented with severe acidosis, elevated anion gap and hyperglycemia started on insulin drip on presentation which has been stopped now.  Gap is closed. Patient reported to be sick at home and missed her insulin dose. Continue long-acting and coverage insulin for now. We will advance diet Diabetic coordinator following.    Abdomen pain, nausea, vomiting: The symptoms are likely from DKA.  The symptoms have improved.  Continue Protonix, Reglan and IV fluids.  Sinus tachycardia: Improving.  Continue IV fluids.  Insulin-dependent diabetes mellitus: Continue insulin as above .Patient is on insulin at home.  History of ethanol abuse: Start on CIWA. Might be withdrawing now with signs of HTN and Tachycardia. Continue CIWA protocol.  History of anxiety/depression:  Resumed her home meds .Hold gabapentin. Patient is still drowsy.  History of hypertension: Currently hypertensive.  Will resume home medication lisinopril.  Hypokalemia: Severe.  Being supplemented.  Will check levels tomorrow  DVT prophylaxis: Lovenox Code Status: Full Family Communication: None present at the bedside Disposition Plan:Home in 1-2 days   Consultants: None  Procedures: None  Antimicrobials: None  Subjective: Patient seen and examined the bedside this morning.  Still complains of mild abdominal pain,nausea and vomiting.  Blood sugar still high.  Increased the dose of Lantus.  Patient asking for pain medication.  Objective: Vitals:   09/14/17 0800 09/14/17 1000 09/14/17 1100 09/14/17 1200  BP: (!) 154/111 (!) 211/87 (!) 168/68 (!) 173/89  Pulse:  98 95 100  Resp: (!) 24 20 16  (!) 22  Temp: 99.4 F (37.4 C)     TempSrc: Oral     SpO2:  100% 98% 98%  Weight:      Height:        Intake/Output Summary (Last 24 hours) at 09/14/2017 1213 Last data filed at 09/14/2017 0800 Gross per 24 hour  Intake 2588.63 ml  Output -  Net 2588.63 ml   Filed Weights   09/11/17 1821 09/12/17 1308 09/13/17 0640  Weight: 86.2 kg (190 lb) 90.7 kg (199 lb 15.3 oz) 95 kg (209 lb 7 oz)    Examination:  General exam: Appears weak ,Not in distress,obese HEENT:PERRL,Oral mucosa dry, Ear/Nose normal on gross exam Respiratory system: Bilateral equal air entry, normal vesicular breath sounds, no wheezes or crackles  Cardiovascular system: S1 & S2 heard, RRR. No JVD, murmurs, rubs, gallops or clicks. Gastrointestinal system: Abdomen is mildly distended, soft  and nontender. No organomegaly or masses felt. Normal bowel sounds heard. Central nervous system: Alert and oriented. No focal neurological deficits. Extremities: No edema, no clubbing ,no cyanosis, distal peripheral pulses palpable. Skin: No rashes, lesions or ulcers,no icterus ,no pallor MSK: Normal muscle bulk,tone  ,power Psychiatry: Judgement and insight appear normal. Mood & affect appropriate.     Data Reviewed: I have personally reviewed following labs and imaging studies  CBC: Recent Labs  Lab 09/11/17 2019  WBC 20.7*  HGB 13.1  HCT 41.2  MCV 81.9  PLT 421*   Basic Metabolic Panel: Recent Labs  Lab 09/13/17 0044 09/13/17 0451 09/13/17 0836 09/13/17 1624 09/14/17 0801  NA 142 146* 142 140 141  K 4.5 4.1 3.5 3.2* 2.9*  CL 112* 115* 117* 111 109  CO2 9* 14* 15* 19* 23  GLUCOSE 229* 86 155* 112* 325*  BUN 15 12 9 6  5*  CREATININE 1.00 0.90 0.86 0.67 0.66  CALCIUM 8.5* 8.7* 8.0* 7.8* 7.9*  MG  --   --   --   --  1.9   GFR: Estimated Creatinine Clearance: 100.5 mL/min (by C-G formula based on SCr of 0.66 mg/dL). Liver Function Tests: Recent Labs  Lab 09/11/17 2019  AST 33  ALT 52  ALKPHOS 143*  BILITOT 1.5*  PROT 8.4*  ALBUMIN 4.4   Recent Labs  Lab 09/11/17 2019  LIPASE 19   No results for input(s): AMMONIA in the last 168 hours. Coagulation Profile: No results for input(s): INR, PROTIME in the last 168 hours. Cardiac Enzymes: No results for input(s): CKTOTAL, CKMB, CKMBINDEX, TROPONINI in the last 168 hours. BNP (last 3 results) No results for input(s): PROBNP in the last 8760 hours. HbA1C: No results for input(s): HGBA1C in the last 72 hours. CBG: Recent Labs  Lab 09/13/17 1521 09/13/17 1731 09/13/17 2234 09/14/17 0906 09/14/17 1111  GLUCAP 119* 200* 213* 312* 261*   Lipid Profile: No results for input(s): CHOL, HDL, LDLCALC, TRIG, CHOLHDL, LDLDIRECT in the last 72 hours. Thyroid Function Tests: No results for input(s): TSH, T4TOTAL, FREET4, T3FREE, THYROIDAB in the last 72 hours. Anemia Panel: No results for input(s): VITAMINB12, FOLATE, FERRITIN, TIBC, IRON, RETICCTPCT in the last 72 hours. Sepsis Labs: Recent Labs  Lab 09/12/17 0648 09/12/17 1115 09/13/17 0451  LATICACIDVEN 4.7* 2.6* 2.1*    No results found for this or any previous  visit (from the past 240 hour(s)).       Radiology Studies: No results found.      Scheduled Meds: . Chlorhexidine Gluconate Cloth  6 each Topical Daily  . enoxaparin (LOVENOX) injection  40 mg Subcutaneous Q24H  . insulin aspart  0-20 Units Subcutaneous QHS  . insulin aspart  0-20 Units Subcutaneous TID WC  . insulin aspart  6 Units Subcutaneous TID WC  . insulin glargine  40 Units Subcutaneous BID  . lisinopril  10 mg Oral Daily  . mirtazapine  30 mg Oral QHS  . multivitamin with minerals  1 tablet Oral Daily  . pantoprazole (PROTONIX) IV  40 mg Intravenous Daily  . thiamine  100 mg Oral Daily   Or  . thiamine  100 mg Intravenous Daily   Continuous Infusions: . sodium chloride 125 mL/hr at 09/14/17 0524  . potassium chloride 10 mEq (09/14/17 1133)     LOS: 2 days    Time spent: 25 mins.More than 50% of that time was spent in counseling and/or coordination of care.      Burnadette Pop, MD  [facility] Pager [phone]  If 7PM-7AM, please contact night-coverage [email or web address] Password [email or web address] 09/14/2017, 12:13 PM

## 2017-09-14 NOTE — Progress Notes (Signed)
RN went to check on patient and change fluids. RN asked if patient would like to have her medications to help her feel better. Patient still refusing medications but states she feels bad. RN asked if he could hook patient up to monitor to more closely monitor her while reinforcing how ill patient is. Patient agreed to that but stated she may not have it on for very long. RN asked patient if there was a reason she didn't want to wear monitor and patient could give no valid reason, stating she didn't want to or didn't feel like it. Patient placed back on monitor. Will continue to monitor

## 2017-09-14 NOTE — Progress Notes (Signed)
Upon entering patient room, patient being argumentative and non compliant about nursing intervention, safety measures and medications. Patient currently refusing to be monitored or a blood pressure to be taken. Patient said she doesn't need it and staff is bothering her. RN again explained patients medical condition and why she needs to be monitored. Patient also upset because RN can not give her any more phenergan at this time. Patient has already had a dose of reglan and phenergan earlier in shift and no more can be given at this time. RN offered zofran which patient refused. Patient also stated she wants more ativan, which cannot be given at this time due to frequency. This RN has gave her a dose earlier in shift and told patient when she could have her next dose I would administer it to patient. RN did CIWA on patient when asked for extra dose of ativan. CIWA is minimal. RN explained to patient importance of medications she is refusing currently and how they would help. Patient still refused. Told RN to get out. RN left the room. Patient currently not on monitor . RN will continue to monitor patient for changes

## 2017-09-14 NOTE — Progress Notes (Signed)
Patient currently refusing nursing intervention and care. Refused to let staff take blood sugars or draw blood for labs last night and this AM. RN had extensive talk with patient, atleast 30mins, about risks of not letting nursing staff do diagnostic care and since we cant do that treat whatever the current problem is. This RN also did education about DKA and her risk to go back into DKA since we get labs and she is refusing blood sugars. RN explained the risks of going into DKA. Patient is alert and oriented X4, states she understands risks and still wants to refuse. Patient states she only wants pain meds and ativan at this time. Patient also stating she doesn't want Black female nurses or MDs because we werent competent and only wants white RNs and MDs. Patient then [facility] since staff is not competent here. RN reverbalized her concerns to her, to make sure she was heard correctly and also made her aware that staff is very competent and let her know my qualifications and how long I was in healthcare to reassure her she is getting adequate care. Patient still refusing. MD and Rn leadership now involved. Nursing Leadership having extensive talk with patient. Will continue to monitor as patient allows

## 2017-09-15 ENCOUNTER — Emergency Department (HOSPITAL_COMMUNITY)
Admission: EM | Admit: 2017-09-15 | Discharge: 2017-09-16 | Disposition: A | Discharge status: Home / Self Care | Payer: Self-pay | Attending: Emergency Medicine | Admitting: Emergency Medicine

## 2017-09-15 ENCOUNTER — Encounter (HOSPITAL_COMMUNITY): Payer: Self-pay | Admitting: Emergency Medicine

## 2017-09-15 DIAGNOSIS — E1165 Type 2 diabetes mellitus with hyperglycemia: Secondary | ICD-10-CM | POA: Insufficient documentation

## 2017-09-15 DIAGNOSIS — F1020 Alcohol dependence, uncomplicated: Secondary | ICD-10-CM

## 2017-09-15 DIAGNOSIS — I10 Essential (primary) hypertension: Secondary | ICD-10-CM | POA: Insufficient documentation

## 2017-09-15 DIAGNOSIS — R112 Nausea with vomiting, unspecified: Secondary | ICD-10-CM | POA: Insufficient documentation

## 2017-09-15 DIAGNOSIS — Z79899 Other long term (current) drug therapy: Secondary | ICD-10-CM | POA: Insufficient documentation

## 2017-09-15 DIAGNOSIS — Z794 Long term (current) use of insulin: Secondary | ICD-10-CM | POA: Insufficient documentation

## 2017-09-15 DIAGNOSIS — R739 Hyperglycemia, unspecified: Secondary | ICD-10-CM

## 2017-09-15 DIAGNOSIS — E1010 Type 1 diabetes mellitus with ketoacidosis without coma: Secondary | ICD-10-CM

## 2017-09-15 DIAGNOSIS — F17210 Nicotine dependence, cigarettes, uncomplicated: Secondary | ICD-10-CM | POA: Insufficient documentation

## 2017-09-15 LAB — COMPREHENSIVE METABOLIC PANEL
ALT: 26 U/L (ref 14–54)
AST: 22 U/L (ref 15–41)
Albumin: 3.2 g/dL — ABNORMAL LOW (ref 3.5–5.0)
Alkaline Phosphatase: 98 U/L (ref 38–126)
Anion gap: 12 (ref 5–15)
BUN: <5 mg/dL — ABNORMAL LOW (ref 6–20)
CO2: 24 mmol/L (ref 22–32)
Calcium: 8.8 mg/dL — ABNORMAL LOW (ref 8.9–10.3)
Chloride: 100 mmol/L — ABNORMAL LOW (ref 101–111)
Creatinine, Ser: 0.63 mg/dL (ref 0.44–1.00)
GFR calc Af Amer: >60 mL/min (ref >60)
GFR calc non Af Amer: >60 mL/min (ref >60)
Glucose, Bld: 224 mg/dL — ABNORMAL HIGH (ref 65–99)
Potassium: 2.9 mmol/L — ABNORMAL LOW (ref 3.5–5.1)
Sodium: 136 mmol/L (ref 135–145)
Total Bilirubin: 0.8 mg/dL (ref 0.3–1.2)
Total Protein: 6.3 g/dL — ABNORMAL LOW (ref 6.5–8.1)

## 2017-09-15 LAB — URINALYSIS, ROUTINE W REFLEX MICROSCOPIC
Bilirubin Urine: NEGATIVE
Glucose, UA: >=500 mg/dL — AB
Hgb urine dipstick: NEGATIVE
Ketones, ur: 80 mg/dL — AB
Leukocytes, UA: NEGATIVE
Nitrite: NEGATIVE
Protein, ur: NEGATIVE mg/dL
Specific Gravity, Urine: 1.013 (ref 1.005–1.030)
pH: 7 (ref 5.0–8.0)

## 2017-09-15 LAB — CBC
HCT: 37.7 % (ref 36.0–46.0)
Hemoglobin: 12.2 g/dL (ref 12.0–15.0)
MCH: 25.4 pg — ABNORMAL LOW (ref 26.0–34.0)
MCHC: 32.4 g/dL (ref 30.0–36.0)
MCV: 78.4 fL (ref 78.0–100.0)
Platelets: 232 10*3/uL (ref 150–400)
RBC: 4.81 MIL/uL (ref 3.87–5.11)
RDW: 16.7 % — ABNORMAL HIGH (ref 11.5–15.5)
WBC: 5.7 10*3/uL (ref 4.0–10.5)

## 2017-09-15 LAB — CBG MONITORING, ED: Glucose-Capillary: 209 mg/dL — ABNORMAL HIGH (ref 65–99)

## 2017-09-15 LAB — POTASSIUM: Potassium: 3.2 mmol/L — ABNORMAL LOW (ref 3.5–5.1)

## 2017-09-15 LAB — BASIC METABOLIC PANEL
Anion gap: 10 (ref 5–15)
BUN: <5 mg/dL — ABNORMAL LOW (ref 6–20)
CO2: 25 mmol/L (ref 22–32)
Calcium: 7.9 mg/dL — ABNORMAL LOW (ref 8.9–10.3)
Chloride: 103 mmol/L (ref 101–111)
Creatinine, Ser: 0.52 mg/dL (ref 0.44–1.00)
GFR calc Af Amer: >60 mL/min (ref >60)
GFR calc non Af Amer: >60 mL/min (ref >60)
Glucose, Bld: 199 mg/dL — ABNORMAL HIGH (ref 65–99)
Potassium: 2.9 mmol/L — ABNORMAL LOW (ref 3.5–5.1)
Sodium: 138 mmol/L (ref 135–145)

## 2017-09-15 LAB — GLUCOSE, CAPILLARY
Glucose-Capillary: 231 mg/dL — ABNORMAL HIGH (ref 65–99)
Glucose-Capillary: 191 mg/dL — ABNORMAL HIGH (ref 65–99)
Glucose-Capillary: 159 mg/dL — ABNORMAL HIGH (ref 65–99)

## 2017-09-15 LAB — LIPASE, BLOOD: Lipase: 30 U/L (ref 11–51)

## 2017-09-15 MED ORDER — LISINOPRIL 10 MG PO TABS: 10.0000 mg | ORAL_TABLET | Freq: Every day | ORAL | 0 refills | Status: DC

## 2017-09-15 MED ORDER — POTASSIUM CHLORIDE 10 MEQ/100ML IV SOLN: 10.0000 meq | INTRAVENOUS | Status: DC

## 2017-09-15 MED ORDER — AMLODIPINE BESYLATE 10 MG PO TABS: 10.0000 mg | ORAL_TABLET | Freq: Every day | ORAL | 0 refills | Status: DC

## 2017-09-15 MED ORDER — POTASSIUM CHLORIDE CRYS ER 20 MEQ PO TBCR
40.0000 meq | EXTENDED_RELEASE_TABLET | Freq: Once | ORAL | Status: AC
  Administered 2017-09-15: 40 meq via ORAL
  Filled 2017-09-15: qty 2

## 2017-09-15 MED ORDER — POTASSIUM CHLORIDE 10 MEQ/100ML IV SOLN
10.0000 meq | INTRAVENOUS | Status: AC
  Administered 2017-09-15 (×2): 10 meq via INTRAVENOUS
  Filled 2017-09-15 (×3): qty 100

## 2017-09-15 MED ORDER — INSULIN ASPART 100 UNIT/ML ~~LOC~~ SOLN: 12.0000 [IU] | Freq: Three times a day (TID) | SUBCUTANEOUS | 0 refills | Status: DC

## 2017-09-15 MED ORDER — THIAMINE HCL 100 MG PO TABS: 100.0000 mg | ORAL_TABLET | Freq: Every day | ORAL | 0 refills | Status: DC

## 2017-09-15 MED ORDER — POTASSIUM CHLORIDE ER 20 MEQ PO TBCR: 20.0000 meq | EXTENDED_RELEASE_TABLET | Freq: Every day | ORAL | 0 refills | Status: DC

## 2017-09-15 MED ORDER — SODIUM CHLORIDE 0.9 % IV SOLN
1.0000 g | Freq: Once | INTRAVENOUS | Status: AC
  Administered 2017-09-15: 1 g via INTRAVENOUS
  Filled 2017-09-15: qty 10

## 2017-09-15 MED ORDER — FOLIC ACID 1 MG PO TABS: 1.0000 mg | ORAL_TABLET | Freq: Every day | ORAL | 0 refills | Status: DC

## 2017-09-15 MED ORDER — INSULIN GLARGINE 100 UNIT/ML ~~LOC~~ SOLN: 60.0000 [IU] | Freq: Two times a day (BID) | SUBCUTANEOUS | 0 refills | Status: DC

## 2017-09-15 MED ORDER — POTASSIUM CHLORIDE 10 MEQ/100ML IV SOLN
10.0000 meq | INTRAVENOUS | Status: AC
  Administered 2017-09-15 (×3): 10 meq via INTRAVENOUS
  Filled 2017-09-15 (×2): qty 100

## 2017-09-15 MED ORDER — METOCLOPRAMIDE HCL 10 MG PO TABS: 10.0000 mg | ORAL_TABLET | Freq: Four times a day (QID) | ORAL | 0 refills | Status: DC | PRN

## 2017-09-15 MED ORDER — PANTOPRAZOLE SODIUM 40 MG PO TBEC: 40.0000 mg | DELAYED_RELEASE_TABLET | Freq: Two times a day (BID) | ORAL | 0 refills | Status: DC

## 2017-09-15 MED ORDER — ONDANSETRON 4 MG PO TBDP
4.0000 mg | ORAL_TABLET | Freq: Once | ORAL | Status: AC | PRN
  Administered 2017-09-15: 4 mg via ORAL
  Filled 2017-09-15: qty 1

## 2017-09-15 MED FILL — LISINOPRIL 10 MG TABS: 10 | 30 days supply | Qty: 30 | Fill #0

## 2017-09-15 MED FILL — PANTOPRAZOLE SOD DR 40 MG T: 40 | 30 days supply | Qty: 60 | Fill #0

## 2017-09-15 MED FILL — LANTUS 100 UNITS/ML VIAL: 100 | 25 days supply | Qty: 30 | Fill #0

## 2017-09-15 MED FILL — POTASSIUM CL ER 20 MEQ TABL: 20 | 14 days supply | Qty: 14 | Fill #0

## 2017-09-15 MED FILL — METOCLOPRAMIDE 10 MG TABLET: 10 | 7 days supply | Qty: 30 | Fill #0

## 2017-09-15 MED FILL — FOLIC ACID 1 MG TABLET: 1 | 30 days supply | Qty: 30 | Fill #0

## 2017-09-15 MED FILL — NovoLOG 100 UNIT/ML SOLN: 100 | 27 days supply | Qty: 10 | Fill #0

## 2017-09-15 MED FILL — AMLODIPINE BESYLATE 10 MG T: 10 | 30 days supply | Qty: 30 | Fill #0

## 2017-09-15 NOTE — Progress Notes (Signed)
Inpatient Diabetes Program Recommendations  AACE/ADA: New Consensus Statement on Inpatient Glycemic Control (2015)  Target Ranges:  Prepandial:   less than 140 mg/dL      Peak postprandial:   less than 180 mg/dL (1-2 hours)      Critically ill patients:  140 - 180 mg/dL   Lab Results  Component Value Date   GLUCAP 191 (H) 09/15/2017   HGBA1C 11.6 (H) 08/04/2017    Review of Glycemic Control  Spoke with pt about her glycemic control at home. Discussed HgbA1C of 11.6% and importance of controlling blood sugars to prevent long and short term complications. Pt not interested in talking. Had eyes closed most of time. States she takes her insulin "sometimes" and monitors her blood sugars most days. Instructed to take Lantus even if she's not eating. Pt said she needs to find new PCP, does not have one.  Will continue to follow.   Thank you. Carolyn Ayala, RD, LDN, CDE Inpatient Diabetes Coordinator [phone]

## 2017-09-15 NOTE — Progress Notes (Signed)
CNA told RN that pt refused CBG check this AM. RN went into room saw that pt is up in chair. RN educated pt on the importance of monitoring her blood sugars. Pt refused education and demanded that she be given apple juice. RN informed pt that RN is unable to give her juice if RN does not know how high her blood sugar is currently. Pt only then allowed RN to check her CBG which was 231. RN informed pt that RN has to give her insulin to cover her high blood sugar. Pt told RN that inulin could wait and that she wanted her apple juice now! RN then explained that if RN is not able to give insulin the apple juice would make her blood sugar higher. Pt became frustrated and verbally abusive. RN had to negotiate with pt, RN told pt that if pt is willing to take her insulin and potassium PO RN was able to give her one carton of apple juice. Pt agreed to this and took her medications. RN assisted pt back into bed, call light within reach, bed alarm on.

## 2017-09-15 NOTE — Progress Notes (Signed)
Given nurse [facility] to give to patient.Patient wants to go to [facility]. Informed patient of importance of going to the pharmacy directly @ d/c. Nsg will have tech to wheel [facility]. Patient wil get bus pass for home after going to pharmacy.

## 2017-09-15 NOTE — Care Management Note (Signed)
Case Management Note  Patient Details  Name: Jacklynn Ganongomekia Denise Meek MRN: [id number] Date of Birth: 1976-03-20  Subjective/Objective:CM referral for pcp-Provided patient w/[facility] for pcp-listed on d/c f/u section-[facility]. [facility] has [facility]. Artistinancial counselor has tried to offer resources-patient declines.                    Action/Plan:d/c home.   Expected Discharge Date:  (unknown)               Expected Discharge Plan:  Home/Self Care  In-House Referral:     Discharge planning Services  CM Consult, [facility]  Post Acute Care Choice:    Choice offered to:     DME Arranged:    DME Agency:     HH Arranged:    HH Agency:     Status of Service:  In process, will continue to follow  If discussed at Long Length of Stay Meetings, dates discussed:    Additional Comments:  Lanier ClamMahabir, Kenn Rekowski, RN 09/15/2017, 1:51 PM

## 2017-09-15 NOTE — ED Triage Notes (Signed)
[facility] after being admitted with DKA.  Reports still having abdominal pain, and n/v.  Reports not being able to eat since leaving due to n/v.

## 2017-09-15 NOTE — Discharge Summary (Signed)
Physician Discharge Summary  Carolyn Ayala WUJ:[id number] DOB: 04-13-1976 DOA: 09/11/2017  PCP: Patient, No Pcp Per  Admit date: 09/11/2017 Discharge date: 09/15/2017  Admitted From: Home Disposition:  Home  Discharge Condition:Stable CODE STATUS:Full Diet recommendation: Carb Modified  Brief/Interim Summary: Patient is a 42 year old female with past medical history of insulin-dependent diabetes mellitus, ethanol abuse, noncompliance who presented to the emergency department with complaints of nausea, vomiting and abdominal pain. Patient did not feel well at home and she missed her insulin.She was reported to be very weak, nauseated and vomited multiple times.She had significant loss of appetite. Patient was found to be in DKA and was admitted for further management. Her pH was 7.01 with anion gap of 29 on presentation.She was started on insulin drip and IV fluids.Also started on bicarb due to severe acidosis. Currently the gap has closed.  She has been started on long-acting and short-acting insulins. Patient has long history of noncompliance, chronic alcoholism.  She is a stable for discharge today but there is high likelihood that she might be readmitted in the future due to her noncompliance issues .   Follow-up appointment has been arranged at [facility] today.  Following problems were addressed during hospitalization:  DKA: Presented with severe acidosis, elevated anion gap and hyperglycemia started on insulin drip on presentation which has been stopped now.  Gap has closed. Patient reported to be sick at home and missed her insulin dose. Continue long-acting and coverage insulin as she was taking at home. Diabetic coordinator was following.  She has been given prescriptions on discharge.   Abdomen pain, nausea, vomiting: The symptoms were likely from DKA.  The symptoms have improved.  Continue Protonix and  Reglan.  Sinus tachycardia:  Resolved  Insulin-dependent diabetes mellitus: Continue insulin as above .Patient is on insulin at home.  History of ethanol abuse: Started on CIWA. Currently not withdrawing.  Patient counseled for the cessation of alcohol.  History of anxiety/depression: Resumed her home meds .We recommend to follow-up with her psychiatrist and outpatient.  History of hypertension: Currently BP acceptable.  Will resume home medication lisinopril.  Hypokalemia: Supplemented.  Please do a BMP test in a week to check for potassium supplementation.  Potassium supplementation prescription provided.   Discharge Diagnoses:  Principal Problem:   DKA (diabetic ketoacidoses) (HCC) Active Problems:   IDDM (insulin dependent diabetes mellitus) (HCC)   Generalized anxiety disorder   Depression, major, recurrent, severe with psychosis (HCC)   Alcohol use disorder, moderate, dependence (HCC)    Discharge Instructions  Discharge Instructions    Diet - low sodium heart healthy   Complete by:  As directed    Discharge instructions   Complete by:  As directed    1) Please follow up at [facility]. 2) Take prescribed medications as instructed. 3) Please check your blood glucose at home.  Make a log book. 4) Follow up with endocrinology as an outpatient. 5) Please stop drinking alcohol. 6) Follow up with your psychiatrist as an outpatient. 7) Do a CBC and BMP test in a week.   Increase activity slowly   Complete by:  As directed      Allergies as of 09/15/2017   No Known Allergies     Medication List    TAKE these medications   amLODipine 10 MG tablet Commonly known as:  NORVASC Take 1 tablet (10 mg total) by mouth daily. Start taking on:  09/16/2017   ARIPiprazole 10  MG tablet Commonly known as:  ABILIFY Take 1 tablet (10 mg total) by mouth daily. For mood control What changed:    how much to take  additional instructions   atorvastatin 40 MG tablet Commonly  known as:  LIPITOR Take 40 mg by mouth daily.   benztropine 0.5 MG tablet Commonly known as:  COGENTIN Take 1 tablet (0.5 mg total) by mouth 2 (two) times daily. For prevention of drug induced tremors   busPIRone 15 MG tablet Commonly known as:  BUSPAR Take 1 tablet (15 mg total) by mouth 3 (three) times daily. For anxiety   folic acid 1 MG tablet Commonly known as:  FOLVITE Take 1 tablet (1 mg total) by mouth daily.   gabapentin 300 MG capsule Commonly known as:  NEURONTIN Take 2 capsules (600 mg total) by mouth 3 (three) times daily. For agitation/diabetic neuropathic What changed:    how much to take  additional instructions   insulin aspart 100 UNIT/ML injection Commonly known as:  NOVOLOG Inject 12 Units into the skin 3 (three) times daily before meals.   insulin glargine 100 UNIT/ML injection Commonly known as:  LANTUS Inject 0.6 mLs (60 Units total) into the skin 2 (two) times daily. What changed:    how much to take  when to take this   lisinopril 10 MG tablet Commonly known as:  PRINIVIL,ZESTRIL Take 1 tablet (10 mg total) by mouth daily. For high blood pressure   metoCLOPramide 10 MG tablet Commonly known as:  REGLAN Take 1 tablet (10 mg total) by mouth every 6 (six) hours as needed for nausea. For nausea What changed:    when to take this  reasons to take this   mirtazapine 15 MG tablet Commonly known as:  REMERON Take 1 tablet (15 mg total) by mouth at bedtime. For depression/sleep What changed:    how much to take  additional instructions   pantoprazole 40 MG tablet Commonly known as:  PROTONIX Take 1 tablet (40 mg total) by mouth 2 (two) times daily with a meal. For acid reflux   Potassium Chloride ER 20 MEQ Tbcr Take 20 mEq by mouth daily.   thiamine 100 MG tablet Take 1 tablet (100 mg total) by mouth daily. Start taking on:  09/16/2017       No Known Allergies  Consultations: None  Procedures/Studies: Dg Chest 2  View  Result Date: 09/07/2017 CLINICAL DATA:  42 y/o  F; couple hours of chest pain. EXAM: CHEST - 2 VIEW COMPARISON:  09/24/2015 chest radiograph FINDINGS: Stable cardiac silhouette within normal limits given projection and technique. Clear lungs. No pleural effusion or pneumothorax. No acute osseous abnormality is evident. IMPRESSION: No acute pulmonary process identified. Electronically Signed   By: Mitzi HansenLance  Furusawa-Stratton M.D.   On: 09/07/2017 00:44       Subjective: Patient seen and examined the bedside this morning.  Looks comfortable.  Complains of some nausea.  Has not vomited.  I explained the discharge planning with the patient.   Discharge Exam: Vitals:   09/15/17 0400 09/15/17 0945  BP: (!) 159/78 (!) 141/83  Pulse:    Resp: 19   Temp:    SpO2:     Vitals:   09/14/17 2200 09/15/17 0321 09/15/17 0400 09/15/17 0945  BP: 138/68  (!) 159/78 (!) 141/83  Pulse: (!) 133     Resp: (!) 23  19   Temp:  98.9 F (37.2 C)    TempSrc:  Oral    SpO2: Marland Kitchen(!)  82%     Weight:      Height:        General: Pt is alert, awake, not in acute distress Cardiovascular: RRR, S1/S2 +, no rubs, no gallops Respiratory: CTA bilaterally, no wheezing, no rhonchi Abdominal: Soft, NT, ND, bowel sounds + Extremities: no edema, no cyanosis    The results of significant diagnostics from this hospitalization (including imaging, microbiology, ancillary and laboratory) are listed below for reference.     Microbiology: No results found for this or any previous visit (from the past 240 hour(s)).   Labs: BNP (last 3 results) No results for input(s): BNP in the last 8760 hours. Basic Metabolic Panel: Recent Labs  Lab 09/13/17 0451 09/13/17 0836 09/13/17 1624 09/14/17 0801 09/15/17 0259 09/15/17 1249  NA 146* 142 140 141 138  --   K 4.1 3.5 3.2* 2.9* 2.9* 3.2*  CL 115* 117* 111 109 103  --   CO2 14* 15* 19* 23 25  --   GLUCOSE 86 155* 112* 325* 199*  --   BUN 12 9 6  5* <5*  --    CREATININE 0.90 0.86 0.67 0.66 0.52  --   CALCIUM 8.7* 8.0* 7.8* 7.9* 7.9*  --   MG  --   --   --  1.9  --   --    Liver Function Tests: Recent Labs  Lab 09/11/17 2019  AST 33  ALT 52  ALKPHOS 143*  BILITOT 1.5*  PROT 8.4*  ALBUMIN 4.4   Recent Labs  Lab 09/11/17 2019  LIPASE 19   No results for input(s): AMMONIA in the last 168 hours. CBC: Recent Labs  Lab 09/11/17 2019  WBC 20.7*  HGB 13.1  HCT 41.2  MCV 81.9  PLT 421*   Cardiac Enzymes: No results for input(s): CKTOTAL, CKMB, CKMBINDEX, TROPONINI in the last 168 hours. BNP: Invalid input(s): POCBNP CBG: Recent Labs  Lab 09/14/17 1609 09/14/17 2232 09/15/17 0805 09/15/17 1107 09/15/17 1231  GLUCAP 110* 165* 231* 191* 159*   D-Dimer No results for input(s): DDIMER in the last 72 hours. Hgb A1c No results for input(s): HGBA1C in the last 72 hours. Lipid Profile No results for input(s): CHOL, HDL, LDLCALC, TRIG, CHOLHDL, LDLDIRECT in the last 72 hours. Thyroid function studies No results for input(s): TSH, T4TOTAL, T3FREE, THYROIDAB in the last 72 hours.  Invalid input(s): FREET3 Anemia work up No results for input(s): VITAMINB12, FOLATE, FERRITIN, TIBC, IRON, RETICCTPCT in the last 72 hours. Urinalysis    Component Value Date/Time   COLORURINE YELLOW 09/11/2017 2133   APPEARANCEUR CLEAR 09/11/2017 2133   LABSPEC 1.017 09/11/2017 2133   PHURINE 5.0 09/11/2017 2133   GLUCOSEU >=500 (A) 09/11/2017 2133   HGBUR SMALL (A) 09/11/2017 2133   BILIRUBINUR NEGATIVE 09/11/2017 2133   KETONESUR 80 (A) 09/11/2017 2133   PROTEINUR 30 (A) 09/11/2017 2133   NITRITE NEGATIVE 09/11/2017 2133   LEUKOCYTESUR NEGATIVE 09/11/2017 2133   Sepsis Labs Invalid input(s): PROCALCITONIN,  WBC,  LACTICIDVEN Microbiology No results found for this or any previous visit (from the past 240 hour(s)).   Time coordinating discharge: Over 30 minutes  SIGNED:   Burnadette Pop, MD  [facility] 09/15/2017, 2:28  PM Pager [phone]  If 7PM-7AM, please contact night-coverage [email or web address] Password [email or web address]

## 2017-09-15 NOTE — Care Management Note (Signed)
Case Management Note  Patient Details  Name: Carolyn Ayala MRN: [id number] Date of Birth: 1976/05/03  Subjective/Objective: Spoke to patient about med asst-she does not have any insulin-wiill use MATCH program-she cannot pay the co pay$3-informed of MATCH program-1xuse within 12 calendar months/select pharmacies.[facility]. Will need bus pass @ d/c-CSW notified. No further CM needs.                   Action/Plan:d/c home.   Expected Discharge Date:  09/15/17               Expected Discharge Plan:  Home/Self Care  In-House Referral:     Discharge planning Services  CM Consult[facility]  Post Acute Care Choice:    Choice offered to:     DME Arranged:    DME Agency:     HH Arranged:    HH Agency:     Status of Service:  Completed, signed off  If discussed at [employer] Meetings, dates discussed:    Additional Comments:  Lanier ClamMahabir, Tressia Labrum, RN 09/15/2017, 2:32 PM

## 2017-09-16 ENCOUNTER — Emergency Department (HOSPITAL_COMMUNITY): Payer: Self-pay

## 2017-09-16 ENCOUNTER — Encounter (HOSPITAL_COMMUNITY): Payer: Self-pay | Admitting: Emergency Medicine

## 2017-09-16 ENCOUNTER — Emergency Department (HOSPITAL_COMMUNITY)
Admission: EM | Admit: 2017-09-16 | Discharge: 2017-09-18 | Disposition: A | Discharge status: Other Acute Inpatient Hospital | Payer: Self-pay | Attending: Emergency Medicine | Admitting: Emergency Medicine

## 2017-09-16 ENCOUNTER — Other Ambulatory Visit: Payer: Self-pay

## 2017-09-16 DIAGNOSIS — F1020 Alcohol dependence, uncomplicated: Secondary | ICD-10-CM | POA: Diagnosis present

## 2017-09-16 DIAGNOSIS — Z794 Long term (current) use of insulin: Secondary | ICD-10-CM | POA: Insufficient documentation

## 2017-09-16 DIAGNOSIS — F209 Schizophrenia, unspecified: Secondary | ICD-10-CM | POA: Insufficient documentation

## 2017-09-16 DIAGNOSIS — F333 Major depressive disorder, recurrent, severe with psychotic symptoms: Secondary | ICD-10-CM | POA: Diagnosis present

## 2017-09-16 DIAGNOSIS — F17210 Nicotine dependence, cigarettes, uncomplicated: Secondary | ICD-10-CM | POA: Insufficient documentation

## 2017-09-16 DIAGNOSIS — E876 Hypokalemia: Secondary | ICD-10-CM | POA: Diagnosis present

## 2017-09-16 DIAGNOSIS — I10 Essential (primary) hypertension: Secondary | ICD-10-CM | POA: Diagnosis present

## 2017-09-16 DIAGNOSIS — F17200 Nicotine dependence, unspecified, uncomplicated: Secondary | ICD-10-CM | POA: Diagnosis present

## 2017-09-16 DIAGNOSIS — E119 Type 2 diabetes mellitus without complications: Secondary | ICD-10-CM

## 2017-09-16 DIAGNOSIS — E1165 Type 2 diabetes mellitus with hyperglycemia: Secondary | ICD-10-CM

## 2017-09-16 DIAGNOSIS — E785 Hyperlipidemia, unspecified: Secondary | ICD-10-CM | POA: Diagnosis present

## 2017-09-16 DIAGNOSIS — R45851 Suicidal ideations: Secondary | ICD-10-CM

## 2017-09-16 DIAGNOSIS — Z79899 Other long term (current) drug therapy: Secondary | ICD-10-CM | POA: Insufficient documentation

## 2017-09-16 DIAGNOSIS — IMO0001 Reserved for inherently not codable concepts without codable children: Secondary | ICD-10-CM

## 2017-09-16 LAB — COMPREHENSIVE METABOLIC PANEL
ALT: 21 U/L (ref 14–54)
AST: 16 U/L (ref 15–41)
Albumin: 2.9 g/dL — ABNORMAL LOW (ref 3.5–5.0)
Alkaline Phosphatase: 88 U/L (ref 38–126)
Anion gap: 12 (ref 5–15)
BUN: <5 mg/dL — ABNORMAL LOW (ref 6–20)
CO2: 20 mmol/L — ABNORMAL LOW (ref 22–32)
Calcium: 8 mg/dL — ABNORMAL LOW (ref 8.9–10.3)
Chloride: 104 mmol/L (ref 101–111)
Creatinine, Ser: 0.65 mg/dL (ref 0.44–1.00)
GFR calc Af Amer: >60 mL/min (ref >60)
GFR calc non Af Amer: >60 mL/min (ref >60)
Glucose, Bld: 294 mg/dL — ABNORMAL HIGH (ref 65–99)
Potassium: 2.9 mmol/L — ABNORMAL LOW (ref 3.5–5.1)
Sodium: 136 mmol/L (ref 135–145)
Total Bilirubin: 0.8 mg/dL (ref 0.3–1.2)
Total Protein: 5.9 g/dL — ABNORMAL LOW (ref 6.5–8.1)

## 2017-09-16 LAB — CBG MONITORING, ED
Glucose-Capillary: 343 mg/dL — ABNORMAL HIGH (ref 65–99)
Glucose-Capillary: 244 mg/dL — ABNORMAL HIGH (ref 65–99)
Glucose-Capillary: 266 mg/dL — ABNORMAL HIGH (ref 65–99)
Glucose-Capillary: 207 mg/dL — ABNORMAL HIGH (ref 65–99)
Glucose-Capillary: 262 mg/dL — ABNORMAL HIGH (ref 65–99)

## 2017-09-16 LAB — I-STAT VENOUS BLOOD GAS, ED
Acid-base deficit: 1 mmol/L (ref 0.0–2.0)
Bicarbonate: 24.2 mmol/L (ref 20.0–28.0)
O2 Saturation: 67 %
TCO2: 25 mmol/L (ref 22–32)
pCO2, Ven: 39.3 mmHg — ABNORMAL LOW (ref 44.0–60.0)
pH, Ven: 7.397 (ref 7.250–7.430)
pO2, Ven: 35 mmHg (ref 32.0–45.0)

## 2017-09-16 LAB — ACETAMINOPHEN LEVEL: Acetaminophen (Tylenol), Serum: <10 ug/mL — ABNORMAL LOW (ref 10–30)

## 2017-09-16 LAB — CBC
HCT: 34.5 % — ABNORMAL LOW (ref 36.0–46.0)
Hemoglobin: 10.8 g/dL — ABNORMAL LOW (ref 12.0–15.0)
MCH: 24.7 pg — ABNORMAL LOW (ref 26.0–34.0)
MCHC: 31.3 g/dL (ref 30.0–36.0)
MCV: 78.8 fL (ref 78.0–100.0)
Platelets: 243 10*3/uL (ref 150–400)
RBC: 4.38 MIL/uL (ref 3.87–5.11)
RDW: 17 % — ABNORMAL HIGH (ref 11.5–15.5)
WBC: 4.7 10*3/uL (ref 4.0–10.5)

## 2017-09-16 LAB — ETHANOL: Alcohol, Ethyl (B): <10 mg/dL (ref <10)

## 2017-09-16 LAB — I-STAT BETA HCG BLOOD, ED (MC, WL, AP ONLY): I-stat hCG, quantitative: <5 m[IU]/mL (ref <5)

## 2017-09-16 LAB — RAPID URINE DRUG SCREEN, HOSP PERFORMED
Amphetamines: NOT DETECTED
Barbiturates: NOT DETECTED
Benzodiazepines: POSITIVE — AB
Cocaine: NOT DETECTED
Opiates: NOT DETECTED
Tetrahydrocannabinol: NOT DETECTED

## 2017-09-16 LAB — SALICYLATE LEVEL: Salicylate Lvl: <7 mg/dL (ref 2.8–30.0)

## 2017-09-16 MED ORDER — NICOTINE 21 MG/24HR TD PT24
21.0000 mg | MEDICATED_PATCH | Freq: Every day | TRANSDERMAL | Status: DC
Start: 2017-09-16 — End: 2017-09-18
  Administered 2017-09-16 – 2017-09-18 (×3): 21 mg via TRANSDERMAL
  Filled 2017-09-16 (×3): qty 1

## 2017-09-16 MED ORDER — METOCLOPRAMIDE HCL 5 MG/ML IJ SOLN
10.0000 mg | INTRAMUSCULAR | Status: AC
  Administered 2017-09-16: 10 mg via INTRAVENOUS
  Filled 2017-09-16: qty 2

## 2017-09-16 MED ORDER — DICYCLOMINE HCL 10 MG/ML IM SOLN
20.0000 mg | Freq: Once | INTRAMUSCULAR | Status: AC
  Administered 2017-09-16: 20 mg via INTRAMUSCULAR
  Filled 2017-09-16: qty 2

## 2017-09-16 MED ORDER — PANTOPRAZOLE SODIUM 40 MG IV SOLR
40.0000 mg | INTRAVENOUS | Status: AC
  Administered 2017-09-16: 40 mg via INTRAVENOUS
  Filled 2017-09-16: qty 40

## 2017-09-16 MED ORDER — POTASSIUM CHLORIDE CRYS ER 20 MEQ PO TBCR: 40.0000 meq | EXTENDED_RELEASE_TABLET | Freq: Once | ORAL | Status: DC

## 2017-09-16 MED ORDER — PROMETHAZINE HCL 25 MG PO TABS
25.0000 mg | ORAL_TABLET | Freq: Once | ORAL | Status: AC
  Administered 2017-09-16: 25 mg via ORAL
  Filled 2017-09-16: qty 1

## 2017-09-16 MED ORDER — POTASSIUM CHLORIDE CRYS ER 20 MEQ PO TBCR: 20.0000 meq | EXTENDED_RELEASE_TABLET | Freq: Every day | ORAL | Status: DC

## 2017-09-16 MED ORDER — ONDANSETRON HCL 4 MG PO TABS
4.0000 mg | ORAL_TABLET | Freq: Three times a day (TID) | ORAL | Status: DC | PRN
  Administered 2017-09-16 – 2017-09-18 (×6): 4 mg via ORAL
  Filled 2017-09-16 (×6): qty 1

## 2017-09-16 MED ORDER — MIRTAZAPINE 15 MG PO TABS
15.0000 mg | ORAL_TABLET | Freq: Every day | ORAL | Status: DC
Start: 2017-09-16 — End: 2017-09-18
  Administered 2017-09-16 – 2017-09-17 (×2): 15 mg via ORAL
  Filled 2017-09-16 (×2): qty 1

## 2017-09-16 MED ORDER — LISINOPRIL 10 MG PO TABS
10.0000 mg | ORAL_TABLET | Freq: Every day | ORAL | Status: DC
  Administered 2017-09-16 – 2017-09-18 (×3): 10 mg via ORAL
  Filled 2017-09-16 (×3): qty 1

## 2017-09-16 MED ORDER — INSULIN GLARGINE 100 UNIT/ML ~~LOC~~ SOLN
60.0000 [IU] | Freq: Two times a day (BID) | SUBCUTANEOUS | Status: DC
  Administered 2017-09-17 (×2): 60 [IU] via SUBCUTANEOUS
  Filled 2017-09-16 (×5): qty 0.6

## 2017-09-16 MED ORDER — SODIUM CHLORIDE 0.9 % IV BOLUS
1000.0000 mL | Freq: Once | INTRAVENOUS | Status: AC
  Administered 2017-09-16: 1000 mL via INTRAVENOUS

## 2017-09-16 MED ORDER — BENZONATATE 100 MG PO CAPS
100.0000 mg | ORAL_CAPSULE | Freq: Three times a day (TID) | ORAL | Status: DC | PRN
  Administered 2017-09-16 – 2017-09-18 (×5): 100 mg via ORAL
  Filled 2017-09-16 (×5): qty 1

## 2017-09-16 MED ORDER — POTASSIUM CHLORIDE CRYS ER 20 MEQ PO TBCR
40.0000 meq | EXTENDED_RELEASE_TABLET | Freq: Once | ORAL | Status: AC
  Administered 2017-09-16: 40 meq via ORAL
  Filled 2017-09-16: qty 2

## 2017-09-16 MED ORDER — ARIPIPRAZOLE 10 MG PO TABS
10.0000 mg | ORAL_TABLET | Freq: Every day | ORAL | Status: DC
  Administered 2017-09-16 – 2017-09-18 (×3): 10 mg via ORAL
  Filled 2017-09-16 (×3): qty 1

## 2017-09-16 MED ORDER — BENZTROPINE MESYLATE 1 MG PO TABS
0.5000 mg | ORAL_TABLET | Freq: Two times a day (BID) | ORAL | Status: DC
  Administered 2017-09-16 – 2017-09-18 (×5): 0.5 mg via ORAL
  Filled 2017-09-16 (×5): qty 1

## 2017-09-16 MED ORDER — ATORVASTATIN CALCIUM 40 MG PO TABS
40.0000 mg | ORAL_TABLET | Freq: Every day | ORAL | Status: DC
  Administered 2017-09-17 – 2017-09-18 (×2): 40 mg via ORAL
  Filled 2017-09-16 (×3): qty 1

## 2017-09-16 MED ORDER — INSULIN ASPART 100 UNIT/ML ~~LOC~~ SOLN
12.0000 [IU] | Freq: Three times a day (TID) | SUBCUTANEOUS | Status: DC
  Administered 2017-09-16: 12 [IU] via SUBCUTANEOUS
  Administered 2017-09-18: 3 [IU] via SUBCUTANEOUS
  Filled 2017-09-16 (×4): qty 1
  Filled 2017-09-16: qty 0.12

## 2017-09-16 MED ORDER — ZOLPIDEM TARTRATE 5 MG PO TABS
5.0000 mg | ORAL_TABLET | Freq: Every evening | ORAL | Status: DC | PRN
  Filled 2017-09-16: qty 1

## 2017-09-16 MED ORDER — AMLODIPINE BESYLATE 5 MG PO TABS
10.0000 mg | ORAL_TABLET | Freq: Every day | ORAL | Status: DC
  Administered 2017-09-16 – 2017-09-18 (×3): 10 mg via ORAL
  Filled 2017-09-16 (×3): qty 2

## 2017-09-16 NOTE — ED Notes (Signed)
Dr Madilyn Hookees aware pt c/o nausea and unable to eat dinner. Pt states she wants to try Phenergan - order received for Phenergan 25mg  tab now and once pt eats meal, administer Novolog 6 units rather than 12 units as ordered if pt's intake is less than 25%.

## 2017-09-16 NOTE — ED Notes (Signed)
Pt given diet gingerale for fluid challenge. Pt tolerating well. Will continue to monitor.  

## 2017-09-16 NOTE — ED Notes (Signed)
TTS being performed.  

## 2017-09-16 NOTE — ED Triage Notes (Signed)
Pt. Stated, Im having suicidal thoughts started yesterday, I lost my mother and Im very depressed. Ive been vomiting also.

## 2017-09-16 NOTE — ED Notes (Signed)
Dr Madilyn Hookees aware pt requesting med for nonprod cough - advised is new onset - approx 2 days. Orders received for CXR and Tessalon Pearles - Pt aware - voiced agreement and appreciation w/tx plan.

## 2017-09-16 NOTE — Discharge Instructions (Signed)
Continue your insulin regimen as prescribed. You may continue Reglan for nausea and vomiting management. Also take Protonix as prescribed at your most recent hospital discharge. Follow up with your primary care doctor regarding your visit today and recent hospital admission.

## 2017-09-16 NOTE — ED Notes (Signed)
Pt being transported to x-ray via w/c - Sitter w/pt. 

## 2017-09-16 NOTE — ED Notes (Signed)
[name] lunch tray arrived. 

## 2017-09-16 NOTE — ED Notes (Signed)
Pt still unable to eat anything. Due for insulin but has not had anything to eat. Will speak with md. Off going RN reported that pt was to have 6u novolog if she ate a little, and 12u if she ate full meal. Pt will not eat.

## 2017-09-16 NOTE — Progress Notes (Signed)
Patient meets criteria for inpatient treatment. [facility] at capacity. CSW faxed referrals to the following facilities for review:  [facility], [facility], [facility], [facility], [facility], [facility], [facility], [facility].  TTS will continue to seek bed placement.   Trula SladeHeather Smart, MSW, LCSW Clinical Social Worker 09/16/2017 2:45 PM

## 2017-09-16 NOTE — ED Notes (Signed)
Pt has returned from x-ray. Sitter w/pt.

## 2017-09-16 NOTE — ED Notes (Signed)
[facility].

## 2017-09-16 NOTE — ED Notes (Signed)
Pt arrived to [facility] - ambulatory wearing burgundy scrubs. Pt alert, oriented, calm, cooperative. States she is SI and BH Counselor advised will be seeking inpt. Pt voiced understanding and had signed Medical Clearance Pt Policy form - pt has copy. Pt states she does not want to eat lunch at this time.

## 2017-09-16 NOTE — ED Notes (Signed)
Pt verbalizes understanding of d/c instructions. Pt ambulatory at d/c with all belongings.   

## 2017-09-16 NOTE — ED Notes (Addendum)
Lying on bed w/eyes closed. Respirations even, unlabored. Easily awakens. Pt given eyeglasses - placed on bedside table as requested Pt has upper dentures in purple denture cup - states she wants them placed back in her purse - done. [name] belongings inventoried - 3 labeled belongings bags placed in [employer], Valuables to security including cell phone and Consulting civil engineercharger, Home Meds to Pharmacy.

## 2017-09-16 NOTE — ED Notes (Signed)
ED Provider at bedside. 

## 2017-09-16 NOTE — ED Provider Notes (Signed)
[facility] EMERGENCY DEPARTMENT Provider Note   CSN: [id number] Arrival date & time: 09/15/17  1844     History   Chief Complaint Chief Complaint  Patient presents with  . Abdominal Pain  . Hyperglycemia    HPI Carolyn Ayala is a 42 y.o. female.   42 year old female with a history of depression, diabetes mellitus, dyslipidemia, hypertension presents to the emergency department for complaints of persistent abdominal pain, nausea, vomiting.  She reports similar symptoms while admitted to the hospital for DKA.  She was discharged at 1400 yesterday.  The patient notes 7 episodes of nonbloody, nonbilious emesis since discharge.  This is associated with generalized abdominal soreness for which she has not taken any additional medications.  She reports giving herself 25 units of insulin since discharge.  She is concerned that she continues to be in DKA.  She has tried drinking apple juice, but has not been able to keep it down.  No associated fevers.  She had a bowel movement earlier today.  No melena or hematochezia, urinary symptoms.  History of poor compliance with diabetes regimen.     Past Medical History:  Diagnosis Date  . Depression   . Diabetes mellitus without complication (HCC)   . High cholesterol   . Hypertension   . Mental disorder   . Noncompliance     Patient Active Problem List   Diagnosis Date Noted  . DKA (diabetic ketoacidoses) (HCC) 09/12/2017  . Suicidal thoughts   . Hypokalemia   . HTN (hypertension) 09/24/2015  . Hyperlipidemia 08/10/2015  . Alcohol use disorder, moderate, dependence (HCC) 08/10/2015  . Neuropathic pain 08/08/2015  . 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 Generalized anxiety disorder 02/13/2013  . Chronic posttraumatic stress disorder 02/13/2013  . IDDM (insulin dependent diabetes mellitus) (HCC) 02/08/2013    Past Surgical History:  Procedure Laterality Date  . CESAREAN  SECTION       OB History   None      Home Medications    Prior to Admission medications   Medication Sig Start Date End Date Taking? Authorizing Provider  amLODipine (NORVASC) 10 MG tablet Take 1 tablet (10 mg total) by mouth daily. 09/16/17  Yes Burnadette Pop, MD  ARIPiprazole (ABILIFY) 10 MG tablet Take 1 tablet (10 mg total) by mouth daily. For mood control Patient taking differently: Take 30 mg by mouth daily. For mood control 08/08/17  Yes Nwoko, Nicole Kindred I, NP  atorvastatin (LIPITOR) 40 MG tablet Take 40 mg by mouth daily.   Yes [provider]  benztropine (COGENTIN) 0.5 MG tablet Take 1 tablet (0.5 mg total) by mouth 2 (two) times daily. For prevention of drug induced tremors 08/07/17  Yes Nwoko, Nicole Kindred I, NP  busPIRone (BUSPAR) 15 MG tablet Take 1 tablet (15 mg total) by mouth 3 (three) times daily. For anxiety 08/07/17  Yes Armandina Stammer I, NP  folic acid (FOLVITE) 1 MG tablet Take 1 tablet (1 mg total) by mouth daily. 09/15/17 10/15/17 Yes Burnadette Pop, MD  gabapentin (NEURONTIN) 300 MG capsule Take 2 capsules (600 mg total) by mouth 3 (three) times daily. For agitation/diabetic neuropathic Patient taking differently: Take 900 mg by mouth 3 (three) times daily. For agitation/diabetic neuropathic 08/07/17  Yes Armandina Stammer I, NP  insulin aspart (NOVOLOG) 100 UNIT/ML injection Inject 12 Units into the skin 3 (three) times daily before meals. 09/15/17  Yes Adhikari, Willia Craze, MD  insulin glargine (LANTUS) 100 UNIT/ML injection Inject 0.6 mLs (  60 Units total) into the skin 2 (two) times daily. 09/15/17  Yes Burnadette Pop, MD  lisinopril (PRINIVIL,ZESTRIL) 10 MG tablet Take 1 tablet (10 mg total) by mouth daily. For high blood pressure 09/15/17  Yes Adhikari, Willia Craze, MD  metoCLOPramide (REGLAN) 10 MG tablet Take 1 tablet (10 mg total) by mouth every 6 (six) hours as needed for nausea. For nausea 09/15/17  Yes Burnadette Pop, MD  mirtazapine (REMERON) 15 MG tablet Take 1 tablet (15 mg total) by  mouth at bedtime. For depression/sleep Patient taking differently: Take 30 mg by mouth at bedtime. For depression/sleep 08/07/17  Yes Armandina Stammer I, NP  pantoprazole (PROTONIX) 40 MG tablet Take 1 tablet (40 mg total) by mouth 2 (two) times daily with a meal. For acid reflux 09/15/17  Yes Burnadette Pop, MD  Potassium Chloride ER 20 MEQ TBCR Take 20 mEq by mouth daily. 09/15/17  Yes Burnadette Pop, MD  thiamine 100 MG tablet Take 1 tablet (100 mg total) by mouth daily. 09/16/17  Yes Burnadette Pop, MD    Family History Family History  Problem Relation Age of Onset  . Depression Mother     Social History Social History   Tobacco Use  . Smoking status: Current Every Day Smoker    Packs/day: 1.00  . Smokeless tobacco: Current User  Substance Use Topics  . Alcohol use: Yes    Comment:  Daily use -- a 1/5 daily  . Drug use: Yes    Frequency: 7.0 times per week    Types: Barbituates, Benzodiazepines    Comment: Pt denied to author     Allergies   Patient has no known allergies.   Review of Systems Review of Systems  Constitutional: Negative for fever.  Cardiovascular: Positive for palpitations.  Gastrointestinal: Positive for abdominal pain, nausea and vomiting.  Ten systems reviewed and are negative for acute change, except as noted in the HPI.    Physical Exam Updated Vital Signs BP 121/72   Pulse 96   Temp 98.7 F (37.1 C) (Oral)   Resp 16   Ht 5\' 2"  (1.575 m)   Wt 86.2 kg (190 lb)   SpO2 98%   BMI 34.75 kg/m   Physical Exam  Constitutional: She is oriented to person, place, and time. She appears well-developed and well-nourished. No distress.  Nontoxic appearing and in NAD  HENT:  Head: Normocephalic and atraumatic.  Eyes: Conjunctivae and EOM are normal. No scleral icterus.  Neck: Normal range of motion.  Cardiovascular: Regular rhythm and intact distal pulses.  Borderline tachycardia  Pulmonary/Chest: Effort normal. No stridor. No respiratory distress. She  has no wheezes.  Lungs CTAB. Respirations even and unlabored.  Abdominal: Soft. She exhibits no mass. There is no guarding.  Soft obese abdomen without focal TTP, peritoneal signs. No palpable masses.  Musculoskeletal: Normal range of motion.  Neurological: She is alert and oriented to person, place, and time. She exhibits normal muscle tone. Coordination normal.  GCS 15. Patient moving extremities spontaneously.  Skin: Skin is warm and dry. No rash noted. She is not diaphoretic. No erythema. No pallor.  Psychiatric: She has a normal mood and affect. Her behavior is normal.  Nursing note and vitals reviewed.    ED Treatments / Results  Labs (all labs ordered are listed, but only abnormal results are displayed) Labs Reviewed  COMPREHENSIVE METABOLIC PANEL - Abnormal; Notable for the following components:      Result Value   Potassium 2.9 (*)    Chloride  100 (*)    Glucose, Bld 224 (*)    BUN <5 (*)    Calcium 8.8 (*)    Total Protein 6.3 (*)    Albumin 3.2 (*)    All other components within normal limits  CBC - Abnormal; Notable for the following components:   MCH 25.4 (*)    RDW 16.7 (*)    All other components within normal limits  URINALYSIS, ROUTINE W REFLEX MICROSCOPIC - Abnormal; Notable for the following components:   Glucose, UA >=500 (*)    Ketones, ur 80 (*)    Bacteria, UA RARE (*)    Squamous Epithelial / LPF 0-5 (*)    All other components within normal limits  CBG MONITORING, ED - Abnormal; Notable for the following components:   Glucose-Capillary 209 (*)    All other components within normal limits  LIPASE, BLOOD  I-STAT BETA HCG BLOOD, ED ([facility], [facility], [facility] ONLY)  CBG MONITORING, ED    EKG None  Radiology No results found.  Procedures Procedures (including critical care time)  Medications Ordered in ED Medications  ondansetron (ZOFRAN-ODT) disintegrating tablet 4 mg (4 mg Oral Given 09/15/17 2002)  sodium chloride 0.9 % bolus 1,000 mL (0 mLs Intravenous  Stopped 09/16/17 0519)  metoCLOPramide (REGLAN) injection 10 mg (10 mg Intravenous Given 09/16/17 0449)  pantoprazole (PROTONIX) injection 40 mg (40 mg Intravenous Given 09/16/17 0452)  dicyclomine (BENTYL) injection 20 mg (20 mg Intramuscular Given 09/16/17 0453)  sodium chloride 0.9 % bolus 1,000 mL (0 mLs Intravenous Stopped 09/16/17 0712)    5:49 AM Patient resting comfortably. No further vomiting. Given ginger ale. Will recheck CBG. If improving and able to tolerate POs, plan for discharge and outpatient follow up.  7:00 AM CBG stable. No further vomiting witnessed.   Initial Impression / Assessment and Plan / ED Course  I have reviewed the triage vital signs and the nursing notes.  Pertinent labs & imaging results that were available during my care of the patient were reviewed by me and considered in my medical decision making (see chart for details).     42 year old female presents to the emergency department for evaluation of nausea and vomiting as well as abdominal pain.  She was discharged from the hospital at 1400 yesterday after admission for similar complaints in the setting of DKA.  Patient with hyperglycemia today which is mild.  No current evidence of DKA.  The patient is afebrile and has maintained stable vital signs.  Laboratory workup reassuring and consistent with discharge.  Hypokalemia is chronic.  The patient has been hydrated with IV fluids and given Protonix, Reglan, Bentyl for management of abdominal pain.  She has not had any additional vomiting and is currently tolerating oral fluids PO without difficulty.  She has no focal tenderness on abdominal exam.  No peritoneal signs.  Low suspicion for emergent etiology of symptoms.  The patient has been advised to follow-up with her primary care doctor regarding her visit today.  I do not see indication for further emergent workup at this time.  Return precautions discussed and provided. Patient discharged in stable condition with no  unaddressed concerns.   Final Clinical Impressions(s) / ED Diagnoses   Final diagnoses:  Nausea and vomiting, intractability of vomiting not specified, unspecified vomiting type  Hyperglycemia    ED Discharge Orders    None       Antony MaduraHumes, Sarie Stall, PA-C 09/16/17 0732    Dione BoozeGlick, David, MD 09/16/17 [id number]

## 2017-09-16 NOTE — ED Notes (Signed)
Pt CBG was 244 mg/dL

## 2017-09-16 NOTE — ED Provider Notes (Signed)
[facility] EMERGENCY DEPARTMENT Provider Note   CSN: [id number] Arrival date & time: 09/16/17  [id number]     History   Chief Complaint Chief Complaint  Patient presents with  . Suicidal  . Emesis    HPI Jacklynn Ganongomekia Denise Gonyer is a 42 y.o. female.  HPI   42 y.o. femalw with ho depression discharged [facility] from [facility] for dka.  Patient reports she got on bus after discharge and felt nauseated so came to [facility].  She was seen here and evaluated last night.  She was discharged fromhere but prior to exiting the department felt more depressed and reports SI of running in front of a bus. She reports some history of depression and prior suicidal ideation.  She reports never having acted on this.  He continues to feel actively depressed.  She lost her mother approximately 6 weeks ago.  [place].  She has not established primary care here.  She is living with her sister.  She does not drink alcohol on a regular basis.  She has not had any alcohol now since Thursday which is 2 days.  She is not complaining of withdrawal symptoms.  However, she has been noncompliant since her discharge yesterday.  On evaluation here last night she was given Zofran, normal saline, Reglan, Protonix, and Bentyl. Past Medical History:  Diagnosis Date  . Depression   . Diabetes mellitus without complication (HCC)   . High cholesterol   . Hypertension   . Mental disorder   . Noncompliance     Patient Active Problem List   Diagnosis Date Noted  . DKA (diabetic ketoacidoses) (HCC) 09/12/2017  . Suicidal thoughts   . Hypokalemia   . HTN (hypertension) 09/24/2015  . Hyperlipidemia 08/10/2015  . Alcohol use disorder, moderate, dependence (HCC) 08/10/2015  . Neuropathic pain 08/08/2015  . 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 Generalized anxiety disorder 02/13/2013  . Chronic posttraumatic stress disorder  02/13/2013  . IDDM (insulin dependent diabetes mellitus) (HCC) 02/08/2013    Past Surgical History:  Procedure Laterality Date  . CESAREAN SECTION       OB History   None      Home Medications    Prior to Admission medications   Medication Sig Start Date End Date Taking? Authorizing Provider  amLODipine (NORVASC) 10 MG tablet Take 1 tablet (10 mg total) by mouth daily. 09/16/17   Burnadette PopAdhikari, Amrit, MD  ARIPiprazole (ABILIFY) 10 MG tablet Take 1 tablet (10 mg total) by mouth daily. For mood control Patient taking differently: Take 30 mg by mouth daily. For mood control 08/08/17   Armandina StammerNwoko, Agnes I, NP  atorvastatin (LIPITOR) 40 MG tablet Take 40 mg by mouth daily.    [provider]  benztropine (COGENTIN) 0.5 MG tablet Take 1 tablet (0.5 mg total) by mouth 2 (two) times daily. For prevention of drug induced tremors 08/07/17   Armandina StammerNwoko, Agnes I, NP  busPIRone (BUSPAR) 15 MG tablet Take 1 tablet (15 mg total) by mouth 3 (three) times daily. For anxiety 08/07/17   Armandina StammerNwoko, Agnes I, NP  folic acid (FOLVITE) 1 MG tablet Take 1 tablet (1 mg total) by mouth daily. 09/15/17 10/15/17  Burnadette PopAdhikari, Amrit, MD  gabapentin (NEURONTIN) 300 MG capsule Take 2 capsules (600 mg total) by mouth 3 (three) times daily. For agitation/diabetic neuropathic Patient taking differently: Take 900 mg by mouth 3 (three) times daily. For agitation/diabetic neuropathic 08/07/17  Armandina Stammer I, NP  insulin aspart (NOVOLOG) 100 UNIT/ML injection Inject 12 Units into the skin 3 (three) times daily before meals. 09/15/17   Burnadette Pop, MD  insulin glargine (LANTUS) 100 UNIT/ML injection Inject 0.6 mLs (60 Units total) into the skin 2 (two) times daily. 09/15/17   Burnadette Pop, MD  lisinopril (PRINIVIL,ZESTRIL) 10 MG tablet Take 1 tablet (10 mg total) by mouth daily. For high blood pressure 09/15/17   Burnadette Pop, MD  metoCLOPramide (REGLAN) 10 MG tablet Take 1 tablet (10 mg total) by mouth every 6 (six) hours as needed for  nausea. For nausea 09/15/17   Burnadette Pop, MD  mirtazapine (REMERON) 15 MG tablet Take 1 tablet (15 mg total) by mouth at bedtime. For depression/sleep Patient taking differently: Take 30 mg by mouth at bedtime. For depression/sleep 08/07/17   Armandina Stammer I, NP  pantoprazole (PROTONIX) 40 MG tablet Take 1 tablet (40 mg total) by mouth 2 (two) times daily with a meal. For acid reflux 09/15/17   Burnadette Pop, MD  Potassium Chloride ER 20 MEQ TBCR Take 20 mEq by mouth daily. 09/15/17   Burnadette Pop, MD  thiamine 100 MG tablet Take 1 tablet (100 mg total) by mouth daily. 09/16/17   Burnadette Pop, MD    Family History Family History  Problem Relation Age of Onset  . Depression Mother     Social History Social History   Tobacco Use  . Smoking status: Current Every Day Smoker    Packs/day: 1.00  . Smokeless tobacco: Current User  Substance Use Topics  . Alcohol use: Yes    Comment:  Daily use -- a 1/5 daily  . Drug use: Yes    Frequency: 7.0 times per week    Types: Barbituates, Benzodiazepines    Comment: Pt denied to author     Allergies   Patient has no known allergies.   Review of Systems Review of Systems  Psychiatric/Behavioral: Positive for dysphoric mood and suicidal ideas.  All other systems reviewed and are negative.    Physical Exam Updated Vital Signs BP (!) 146/94 (BP Location: Right Arm)   Pulse (!) 109   Temp 99.2 F (37.3 C) (Oral)   Resp 16   Ht 1.575 m (5\' 2" )   Wt 86.2 kg (190 lb)   LMP 09/15/2017   SpO2 96%   BMI 34.75 kg/m   Physical Exam  Constitutional: She is oriented to person, place, and time. She appears well-developed and well-nourished. No distress.  Morbidly obese female  HENT:  Head: Normocephalic and atraumatic.  Eyes: Pupils are equal, round, and reactive to light.  Neck: Normal range of motion.  Cardiovascular: Normal rate and regular rhythm.  Pulmonary/Chest: Effort normal and breath sounds normal.  Abdominal: Soft.  Bowel sounds are normal.  Musculoskeletal: Normal range of motion.  Neurological: She is alert and oriented to person, place, and time.  Skin: Skin is warm and dry. Capillary refill takes less than 2 seconds.  Psychiatric: Her speech is normal and behavior is normal. She exhibits a depressed mood. She expresses suicidal ideation. She expresses no homicidal ideation.  Nursing note and vitals reviewed.    ED Treatments / Results  Labs (all labs ordered are listed, but only abnormal results are displayed) Labs Reviewed  COMPREHENSIVE METABOLIC PANEL - Abnormal; Notable for the following components:      Result Value   Potassium 2.9 (*)    CO2 20 (*)    Glucose, Bld 294 (*)  BUN <5 (*)    Calcium 8.0 (*)    Total Protein 5.9 (*)    Albumin 2.9 (*)    All other components within normal limits  ACETAMINOPHEN LEVEL - Abnormal; Notable for the following components:   Acetaminophen (Tylenol), Serum <10 (*)    All other components within normal limits  CBC - Abnormal; Notable for the following components:   Hemoglobin 10.8 (*)    HCT 34.5 (*)    MCH 24.7 (*)    RDW 17.0 (*)    All other components within normal limits  ETHANOL  SALICYLATE LEVEL  RAPID URINE DRUG SCREEN, HOSP PERFORMED  I-STAT BETA HCG BLOOD, ED ([facility], [facility], [facility] ONLY)    EKG None  Radiology No results found.  Procedures Procedures (including critical care time)  Medications Ordered in ED Medications  potassium chloride SA (K-DUR,KLOR-CON) CR tablet 40 mEq (has no administration in time range)  amLODipine (NORVASC) tablet 10 mg (has no administration in time range)  ARIPiprazole (ABILIFY) tablet 10 mg (has no administration in time range)  atorvastatin (LIPITOR) tablet 40 mg (has no administration in time range)  benztropine (COGENTIN) tablet 0.5 mg (has no administration in time range)  insulin aspart (novoLOG) injection 12 Units (has no administration in time range)  insulin glargine (LANTUS) injection 60  Units (has no administration in time range)  lisinopril (PRINIVIL,ZESTRIL) tablet 10 mg (has no administration in time range)  mirtazapine (REMERON) tablet 15 mg (has no administration in time range)  Potassium Chloride ER TBCR 20 mEq (has no administration in time range)     Initial Impression / Assessment and Plan / ED Course  I have reviewed the triage vital signs and the nursing notes.  Pertinent labs & imaging results that were available during my care of the patient were reviewed by me and considered in my medical decision making (see chart for details).     42 year old female history of insulin-dependent diabetes presents after discharge for treatment of DKA and does not appear to be in DKA now.  She is depressed with suicidal ideation.  Plan to restart her home medications.  She will need to have her potassium rechecked and blood sugars followed.  She will need potassium supplementation. Home meds including potassium and insulin ordered. Patient appears stable for psych evaluation  Final Clinical Impressions(s) / ED Diagnoses   Final diagnoses:  Suicidal ideation  Type 2 diabetes mellitus with hyperglycemia, with long-term current use of insulin [facility])    ED Discharge Orders    None       Margarita Grizzle, MD 09/16/17 1434
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
## 2017-09-16 NOTE — ED Notes (Signed)
Pt has been wanded, bed place ment called and requested a sitter. Changed into scrubs, and personal items put into bag.

## 2017-09-16 NOTE — ED Notes (Signed)
Pt still unable to eat and still refusing insulin.  Will not even consider her nightly lantus at this time. Will speak with md about pt.

## 2017-09-16 NOTE — ED Notes (Signed)
Pt given chicken bouillon and crackers. Pt c/o being very nauseous. Will continue to monitor.

## 2017-09-16 NOTE — ED Notes (Signed)
Pt just had a phenergan po. Will continue to monitor. C/o continued nausea not improving.

## 2017-09-17 ENCOUNTER — Encounter (HOSPITAL_COMMUNITY): Payer: Self-pay | Admitting: Registered Nurse

## 2017-09-17 DIAGNOSIS — F1910 Other psychoactive substance abuse, uncomplicated: Secondary | ICD-10-CM

## 2017-09-17 DIAGNOSIS — Z9114 Patient's other noncompliance with medication regimen: Secondary | ICD-10-CM

## 2017-09-17 DIAGNOSIS — E1310 Other specified diabetes mellitus with ketoacidosis without coma: Secondary | ICD-10-CM

## 2017-09-17 DIAGNOSIS — Z560 Unemployment, unspecified: Secondary | ICD-10-CM

## 2017-09-17 DIAGNOSIS — R45851 Suicidal ideations: Secondary | ICD-10-CM

## 2017-09-17 DIAGNOSIS — Z634 Disappearance and death of family member: Secondary | ICD-10-CM

## 2017-09-17 DIAGNOSIS — F1310 Sedative, hypnotic or anxiolytic abuse, uncomplicated: Secondary | ICD-10-CM

## 2017-09-17 DIAGNOSIS — Z818 Family history of other mental and behavioral disorders: Secondary | ICD-10-CM

## 2017-09-17 LAB — BASIC METABOLIC PANEL
Anion gap: 15 (ref 5–15)
BUN: <5 mg/dL — ABNORMAL LOW (ref 6–20)
CO2: 23 mmol/L (ref 22–32)
Calcium: 8.6 mg/dL — ABNORMAL LOW (ref 8.9–10.3)
Chloride: 100 mmol/L — ABNORMAL LOW (ref 101–111)
Creatinine, Ser: 0.63 mg/dL (ref 0.44–1.00)
GFR calc Af Amer: >60 mL/min (ref >60)
GFR calc non Af Amer: >60 mL/min (ref >60)
Glucose, Bld: 195 mg/dL — ABNORMAL HIGH (ref 65–99)
Potassium: 2.8 mmol/L — ABNORMAL LOW (ref 3.5–5.1)
Sodium: 138 mmol/L (ref 135–145)

## 2017-09-17 LAB — CBG MONITORING, ED
Glucose-Capillary: 177 mg/dL — ABNORMAL HIGH (ref 65–99)
Glucose-Capillary: 228 mg/dL — ABNORMAL HIGH (ref 65–99)
Glucose-Capillary: 187 mg/dL — ABNORMAL HIGH (ref 65–99)
Glucose-Capillary: 128 mg/dL — ABNORMAL HIGH (ref 65–99)

## 2017-09-17 MED ORDER — ESCITALOPRAM OXALATE 10 MG PO TABS
5.0000 mg | ORAL_TABLET | Freq: Every day | ORAL | Status: DC
  Administered 2017-09-17 – 2017-09-18 (×2): 5 mg via ORAL
  Filled 2017-09-17 (×2): qty 1

## 2017-09-17 MED ORDER — POTASSIUM CHLORIDE CRYS ER 20 MEQ PO TBCR
60.0000 meq | EXTENDED_RELEASE_TABLET | Freq: Once | ORAL | Status: AC
Start: 2017-09-17 — End: 2017-09-17
  Administered 2017-09-17: 60 meq via ORAL
  Filled 2017-09-17: qty 3

## 2017-09-17 MED ORDER — POTASSIUM CHLORIDE CRYS ER 20 MEQ PO TBCR
40.0000 meq | EXTENDED_RELEASE_TABLET | Freq: Two times a day (BID) | ORAL | Status: DC
  Filled 2017-09-17: qty 2

## 2017-09-17 MED ORDER — PROMETHAZINE HCL 25 MG PO TABS
25.0000 mg | ORAL_TABLET | Freq: Once | ORAL | Status: AC
  Administered 2017-09-17: 25 mg via ORAL
  Filled 2017-09-17: qty 1

## 2017-09-17 MED ORDER — POTASSIUM CHLORIDE CRYS ER 20 MEQ PO TBCR: 40.0000 meq | EXTENDED_RELEASE_TABLET | Freq: Once | ORAL | Status: DC

## 2017-09-17 MED ORDER — POTASSIUM CHLORIDE 20 MEQ/15ML (10%) PO SOLN
40.0000 meq | Freq: Once | ORAL | Status: AC
  Administered 2017-09-17: 40 meq via ORAL
  Filled 2017-09-17: qty 30

## 2017-09-17 NOTE — ED Notes (Signed)
Pt still refusing her Lantus insulin. Spoke with Sharilyn SitesLisa Sanders, PA. Misty StanleyLisa to come see pt.

## 2017-09-17 NOTE — ED Notes (Signed)
After speaking to pt at length, pt still refusing her lantus. Explained to her that if security has to get involved, she will have to take it and possibly be IVC'd. Pt states "I don't care" "I didn't come here for my diabetes, I came here because I'm depressed". This RN stated that was completely understandable, but we are here to take care of her in every way.  Explained that we have to get her blood sugars under control so she can feel better all around. Pt agreed to take her lantus. This RN promised that I would let her sleep the rest of the night, if she took her med. Pt agreed. Will continue to monitor pt. Sitter outside of room.

## 2017-09-17 NOTE — ED Notes (Signed)
Patient denies pain and is resting comfortably.  

## 2017-09-17 NOTE — ED Notes (Signed)
Sharilyn SitesLisa Sanders, PA wants pt to have her nightly lantus. Will call security if needed. In to speak with pt.

## 2017-09-17 NOTE — ED Notes (Signed)
Pt refusing blood sugar check. "I don't want it checked" "I don't care what it is". Talked pt into letting this RN check blood sugar. Pt c/o increased nausea again. "Please get me something for this".

## 2017-09-17 NOTE — Consult Note (Signed)
Telepsych Consultation   Reason for Consult:  Suicidal ideation Referring Physician: Pattricia Boss, MD  Location of Patient: [facility] Location of Provider: [facility]  Patient Identification: Carolyn Ayala MRN:  [id number] Principal Diagnosis: Depression, major, recurrent, severe with psychosis ([place]) Diagnosis:   Patient Active Problem List   Diagnosis Date Noted  . DKA (diabetic ketoacidoses) ([employer]) [E13.10] 09/12/2017  . Suicidal thoughts [R45.851]   . Hypokalemia [E87.6]   . HTN (hypertension) [I10] 09/24/2015  . Hyperlipidemia [E78.5] 08/10/2015  . Alcohol use disorder, moderate, dependence ([place]) [F10.20] 08/10/2015  . Neuropathic pain [M79.2] 08/08/2015  . Depression, major, recurrent, severe with psychosis ([place]) [F33.3] 08/06/2015  . Generalized anxiety disorder [F41.1] 02/13/2013  . Chronic posttraumatic stress disorder [F43.12] 02/13/2013  . IDDM (insulin dependent diabetes mellitus) ([place]) [E11.9, Z79.4] 02/08/2013    Total Time spent with patient: 45 minutes  Subjective:   Carolyn Ayala is a 42 y.o. female patient [facility] after being discharged on 09/15/17 for DKA with complaints of worsening depression and suicidal ideation related to the recent death of her mother.  HPI:  Carolyn Ayala, 42 y.o., female patient seen via telepsych by this provider; chart reviewed and consulted with Dr. Dwyane Dee on 09/17/17.  On evaluation Carolyn Ayala reports that she is having worsening depression and suicidal thoughts.  Patient states that her mother died 2 months ago and her grandmother died 2 years ago.  "I don't have put in like to have nothing else to live for and I want to live without my mother.  Patient also states that she is hearing voices telling her to suitable school and they are also telling her that she is useless, hopeless, and motherless.  Patient states she does not feel safe going home.  Patient reports that she is now homeless;  states she was living with her niece but she is unable to go back there.  Patient has been refusing to take her diabetic medication while she is in the emergency room patient states "I thought I was only supposed to take my diabetic medicine with food and I am not eating anything because of 6 RN think I had to take my medicine."  Patient also reports after her discharge from [facility] [place] that she did not follow-up with any of the resources that were given and that she has not taken her medications since her discharge in February 2019. During evaluation patient is sitting up on bed in burgundy scrubs, she is alert/oriented x3; calm and cooperative; patient does not appear to be responding to internal/external stimuli or delusional thoughts.  Possible malingering I am patient has presented multiple occasions with same complaint of worsening depression because of death of mother. upon chart review patient has stated on multiple occasions at different times the death of her mother grandmother.    Past Psychiatric History: Depression with psychosis and substance abuse  Risk to Self: Suicidal Ideation: Yes-Currently Present Suicidal Intent: Yes-Currently Present Is patient at risk for suicide?: Yes Suicidal Plan?: Yes-Currently Present Specify Current Suicidal Plan: "run in front of fastest vehicle"("or stand in front of train") Access to Means: Yes Specify Access to Suicidal Means: access to traffic and railroad tracks What has been your use of drugs/alcohol within the last 12 months?: daily etoh use How many times?: 3 Other Self Harm Risks: none Triggers for Past Attempts: Unpredictable, Unknown Intentional Self Injurious Behavior: None Risk to Others: Homicidal Ideation: No Thoughts of Harm to Others: No Current Homicidal Intent: No Current  Homicidal Plan: No Access to Homicidal Means: No Identified Victim: none History of harm to others?: No Assessment of Violence: None Noted Violent Behavior  Description: pt denies history of violence Does patient have access to weapons?: No Criminal Charges Pending?: No Does patient have a court date: No Prior Inpatient Therapy: Prior Inpatient Therapy: Yes Prior Therapy Dates: 2016 - 2019 Prior Therapy Facilty/Provider(s): [facility] Reason for Treatment: schizophrenia, etoh abuse Prior Outpatient Therapy: Prior Outpatient Therapy: No Prior Therapy Dates: na Prior Therapy Facilty/Provider(s): na Reason for Treatment: na Does patient have an ACCT team?: No Does patient have Intensive In-House Services?  : No Does patient have [facility] services? : No Does patient have P4CC services?: Unknown  Past Medical History:  Past Medical History:  Diagnosis Date  . Depression   . Diabetes mellitus without complication ([place])   . High cholesterol   . Hypertension   . Mental disorder   . Noncompliance     Past Surgical History:  Procedure Laterality Date  . CESAREAN SECTION     Family History:  Family History  Problem Relation Age of Onset  . Depression Mother    Family Psychiatric  History: See above Social History:  Social History   Substance and Sexual Activity  Alcohol Use Yes   Comment:  Daily use -- a 1/5 daily     Social History   Substance and Sexual Activity  Drug Use Yes  . Frequency: 7.0 times per week  . Types: Barbituates, Benzodiazepines   Comment: Pt denied to Chief Strategy Officer    Social History   Socioeconomic History  . Marital status: Single    Spouse name: Not on file  . Number of children: Not on file  . Years of education: Not on file  . Highest education level: Not on file  Occupational History  . Not on file  Social Needs  . Financial resource strain: Not on file  . Food insecurity:    Worry: Not on file    Inability: Not on file  . Transportation needs:    Medical: Not on file    Non-medical: Not on file  Tobacco Use  . Smoking status: Current Every Day Smoker    Packs/day: 1.00  . Smokeless tobacco:  Current User  Substance and Sexual Activity  . Alcohol use: Yes    Comment:  Daily use -- a 1/5 daily  . Drug use: Yes    Frequency: 7.0 times per week    Types: Barbituates, Benzodiazepines    Comment: Pt denied to author  . Sexual activity: Never  Lifestyle  . Physical activity:    Days per week: Not on file    Minutes per session: Not on file  . Stress: Not on file  Relationships  . Social connections:    Talks on phone: Not on file    Gets together: Not on file    Attends religious service: Not on file    Active member of club or organization: Not on file    Attends meetings of clubs or organizations: Not on file    Relationship status: Not on file  Other Topics Concern  . Not on file  Social History Narrative   ** Merged History Encounter **       Additional Social History:    Allergies:  No Known Allergies  Labs:  Results for orders placed or performed during the hospital encounter of 09/16/17 (from the past 48 hour(s))  Comprehensive metabolic panel  Status: Abnormal   Collection Time: 09/16/17  8:40 AM  Result Value Ref Range   Sodium 136 135 - 145 mmol/L   Potassium 2.9 (L) 3.5 - 5.1 mmol/L   Chloride 104 101 - 111 mmol/L   CO2 20 (L) 22 - 32 mmol/L   Glucose, Bld 294 (H) 65 - 99 mg/dL   BUN <5 (L) 6 - 20 mg/dL   Creatinine, Ser 0.65 0.44 - 1.00 mg/dL   Calcium 8.0 (L) 8.9 - 10.3 mg/dL   Total Protein 5.9 (L) 6.5 - 8.1 g/dL   Albumin 2.9 (L) 3.5 - 5.0 g/dL   AST 16 15 - 41 U/L   ALT 21 14 - 54 U/L   Alkaline Phosphatase 88 38 - 126 U/L   Total Bilirubin 0.8 0.3 - 1.2 mg/dL   GFR calc non Af Amer >60 >60 mL/min   GFR calc Af Amer >60 >60 mL/min    Comment: (NOTE) The eGFR has been calculated using the CKD EPI equation. This calculation has not been validated in all clinical situations. eGFR's persistently <60 mL/min signify possible Chronic Kidney Disease.    Anion gap 12 5 - 15    Comment: Performed at [facility] [address],  [place], [place] [address]  Ethanol     Status: None   Collection Time: 09/16/17  8:40 AM  Result Value Ref Range   Alcohol, Ethyl (B) <10 <10 mg/dL    Comment:        LOWEST DETECTABLE LIMIT FOR SERUM ALCOHOL IS 10 mg/dL FOR MEDICAL PURPOSES ONLY Performed at [facility], [place] [address], [place], [place] [address]   Salicylate level     Status: None   Collection Time: 09/16/17  8:40 AM  Result Value Ref Range   Salicylate Lvl <8.0 2.8 - 30.0 mg/dL    Comment: Performed at [facility] [address], [place], [place] [address]  Acetaminophen level     Status: Abnormal   Collection Time: 09/16/17  8:40 AM  Result Value Ref Range   Acetaminophen (Tylenol), Serum <10 (L) 10 - 30 ug/mL    Comment:        THERAPEUTIC CONCENTRATIONS VARY SIGNIFICANTLY. A RANGE OF 10-30 ug/mL Carolyn BE AN EFFECTIVE CONCENTRATION FOR MANY PATIENTS. HOWEVER, SOME ARE BEST TREATED AT CONCENTRATIONS OUTSIDE THIS RANGE. ACETAMINOPHEN CONCENTRATIONS >150 ug/mL AT 4 HOURS AFTER INGESTION AND >50 ug/mL AT 12 HOURS AFTER INGESTION ARE OFTEN ASSOCIATED WITH TOXIC REACTIONS. Performed at [facility], [place] [address] [place], [place] [address]   cbc     Status: Abnormal   Collection Time: 09/16/17  8:40 AM  Result Value Ref Range   WBC 4.7 4.0 - 10.5 K/uL   RBC 4.38 3.87 - 5.11 MIL/uL   Hemoglobin 10.8 (L) 12.0 - 15.0 g/dL   HCT 34.5 (L) 36.0 - 46.0 %   MCV 78.8 78.0 - 100.0 fL   MCH 24.7 (L) 26.0 - 34.0 pg   MCHC 31.3 30.0 - 36.0 g/dL   RDW 17.0 (H) 11.5 - 15.5 %   Platelets 243 150 - 400 K/uL    Comment: Performed at [facility], [place] [address] [place],  [address]  I-Stat beta hCG blood, ED     Status: None   Collection Time: 09/16/17  8:51 AM  Result Value Ref Range   I-stat hCG, quantitative <5.0 <5 mIU/mL   Comment 3  Comment:   GEST. AGE      CONC.  (mIU/mL)   <=1 WEEK        5 - 50     2 WEEKS       50 - 500     3 WEEKS       100 - 10,000     4 WEEKS      1,000 - 30,000        FEMALE AND NON-PREGNANT FEMALE:     LESS THAN 5 mIU/mL   Rapid urine drug screen (hospital performed)     Status: Abnormal   Collection Time: 09/16/17  8:55 AM  Result Value Ref Range   Opiates NONE DETECTED NONE DETECTED   Cocaine NONE DETECTED NONE DETECTED   Benzodiazepines POSITIVE (A) NONE DETECTED   Amphetamines NONE DETECTED NONE DETECTED   Tetrahydrocannabinol NONE DETECTED NONE DETECTED   Barbiturates NONE DETECTED NONE DETECTED    Comment: (NOTE) DRUG SCREEN FOR MEDICAL PURPOSES ONLY.  IF CONFIRMATION IS NEEDED FOR ANY PURPOSE, NOTIFY LAB WITHIN 5 DAYS. LOWEST DETECTABLE LIMITS FOR URINE DRUG SCREEN Drug Class                     Cutoff (ng/mL) Amphetamine and metabolites    1000 Barbiturate and metabolites    200 Benzodiazepine                 374 Tricyclics and metabolites     300 Opiates and metabolites        300 Cocaine and metabolites        300 THC                            50 Performed at [facility], [place] [address], [place], [place] [address]   I-Stat venous blood gas, ED     Status: Abnormal   Collection Time: 09/16/17 12:32 PM  Result Value Ref Range   pH, Ven 7.397 7.250 - 7.430   pCO2, Ven 39.3 (L) 44.0 - 60.0 mmHg   pO2, Ven 35.0 32.0 - 45.0 mmHg   Bicarbonate 24.2 20.0 - 28.0 mmol/L   TCO2 25 22 - 32 mmol/L   O2 Saturation 67.0 %   Acid-base deficit 1.0 0.0 - 2.0 mmol/L   Patient temperature HIDE    Sample type VENOUS    Comment NOTIFIED PHYSICIAN   CBG monitoring, ED     Status: Abnormal   Collection Time: 09/16/17 12:56 PM  Result Value Ref Range   Glucose-Capillary 266 (H) 65 - 99 mg/dL  CBG monitoring, ED     Status: Abnormal   Collection Time: 09/16/17  6:12 PM  Result Value Ref Range   Glucose-Capillary 207 (H) 65 - 99 mg/dL  CBG monitoring, ED     Status: Abnormal   Collection Time: 09/16/17 10:49 PM  Result Value Ref Range   Glucose-Capillary 262 (H) 65 - 99 mg/dL   Comment 1 Call MD NNP PA CNM     Comment 2 Document in Chart   CBG monitoring, ED     Status: Abnormal   Collection Time: 09/17/17  7:30 AM  Result Value Ref Range   Glucose-Capillary 177 (H) 65 - 99 mg/dL  Basic metabolic panel     Status: Abnormal   Collection Time: 09/17/17  8:03 AM  Result Value Ref Range   Sodium 138 135 - 145 mmol/L   Potassium 2.8 (L)  3.5 - 5.1 mmol/L   Chloride 100 (L) 101 - 111 mmol/L   CO2 23 22 - 32 mmol/L   Glucose, Bld 195 (H) 65 - 99 mg/dL   BUN <5 (L) 6 - 20 mg/dL   Creatinine, Ser 0.63 0.44 - 1.00 mg/dL   Calcium 8.6 (L) 8.9 - 10.3 mg/dL   GFR calc non Af Amer >60 >60 mL/min   GFR calc Af Amer >60 >60 mL/min    Comment: (NOTE) The eGFR has been calculated using the CKD EPI equation. This calculation has not been validated in all clinical situations. eGFR's persistently <60 mL/min signify possible Chronic Kidney Disease.    Anion gap 15 5 - 15    Comment: Performed at [facility] [address], [place], [place] [address]  CBG monitoring, ED     Status: Abnormal   Collection Time: 09/17/17 10:57 AM  Result Value Ref Range   Glucose-Capillary 228 (H) 65 - 99 mg/dL  CBG monitoring, ED     Status: Abnormal   Collection Time: 09/17/17 12:35 PM  Result Value Ref Range   Glucose-Capillary 187 (H) 65 - 99 mg/dL    Medications:  Current Facility-Administered Medications  Medication Dose Route Frequency Provider Last Rate Last Dose  . amLODipine (NORVASC) tablet 10 mg  10 mg Oral Daily Pattricia Boss, MD   10 mg at 09/17/17 1046  . ARIPiprazole (ABILIFY) tablet 10 mg  10 mg Oral Daily Pattricia Boss, MD   10 mg at 09/17/17 1045  . atorvastatin (LIPITOR) tablet 40 mg  40 mg Oral Daily Pattricia Boss, MD   40 mg at 09/17/17 1045  . benzonatate (TESSALON) capsule 100 mg  100 mg Oral TID PRN Quintella Reichert, MD   100 mg at 09/17/17 1045  . benztropine (COGENTIN) tablet 0.5 mg  0.5 mg Oral BID Pattricia Boss, MD   0.5 mg at 09/17/17 1044  . insulin aspart (novoLOG) injection 12  Units  12 Units Subcutaneous TID AC Pattricia Boss, MD   Stopped at 09/17/17 0830  . insulin glargine (LANTUS) injection 60 Units  60 Units Subcutaneous BID Pattricia Boss, MD   60 Units at 09/17/17 1051  . lisinopril (PRINIVIL,ZESTRIL) tablet 10 mg  10 mg Oral Daily Pattricia Boss, MD   10 mg at 09/17/17 1049  . mirtazapine (REMERON) tablet 15 mg  15 mg Oral QHS Pattricia Boss, MD   15 mg at 09/16/17 2235  . nicotine (NICODERM CQ - dosed in mg/24 hours) patch 21 mg  21 mg Transdermal Daily Pattricia Boss, MD   21 mg at 09/17/17 1053  . ondansetron (ZOFRAN) tablet 4 mg  4 mg Oral Q8H PRN Pattricia Boss, MD   4 mg at 09/17/17 1238  . potassium chloride SA (K-DUR,KLOR-CON) CR tablet 20 mEq  20 mEq Oral Daily Ray, Andee Poles, MD      . zolpidem (AMBIEN) tablet 5 mg  5 mg Oral QHS PRN Pattricia Boss, MD       Current Outpatient Medications  Medication Sig Dispense Refill  . amLODipine (NORVASC) 10 MG tablet Take 1 tablet (10 mg total) by mouth daily. 30 tablet 0  . ARIPiprazole (ABILIFY) 10 MG tablet Take 1 tablet (10 mg total) by mouth daily. For mood control (Patient taking differently: Take 30 mg by mouth daily. For mood control) 30 tablet 0  . atorvastatin (LIPITOR) 40 MG tablet Take 40 mg by mouth daily.    . benztropine (COGENTIN) 0.5 MG tablet Take 1 tablet (  0.5 mg total) by mouth 2 (two) times daily. For prevention of drug induced tremors 30 tablet 0  . busPIRone (BUSPAR) 15 MG tablet Take 1 tablet (15 mg total) by mouth 3 (three) times daily. For anxiety 90 tablet 0  . folic acid (FOLVITE) 1 MG tablet Take 1 tablet (1 mg total) by mouth daily. 30 tablet 0  . gabapentin (NEURONTIN) 300 MG capsule Take 2 capsules (600 mg total) by mouth 3 (three) times daily. For agitation/diabetic neuropathic (Patient taking differently: Take 900 mg by mouth 3 (three) times daily. For agitation/diabetic neuropathic) 180 capsule 0  . insulin aspart (NOVOLOG) 100 UNIT/ML injection Inject 12 Units into the skin 3 (three)  times daily before meals. 3 vial 0  . insulin glargine (LANTUS) 100 UNIT/ML injection Inject 0.6 mLs (60 Units total) into the skin 2 (two) times daily. 3 vial 0  . lisinopril (PRINIVIL,ZESTRIL) 10 MG tablet Take 1 tablet (10 mg total) by mouth daily. For high blood pressure 30 tablet 0  . metoCLOPramide (REGLAN) 10 MG tablet Take 1 tablet (10 mg total) by mouth every 6 (six) hours as needed for nausea. For nausea 30 tablet 0  . mirtazapine (REMERON) 15 MG tablet Take 1 tablet (15 mg total) by mouth at bedtime. For depression/sleep (Patient taking differently: Take 30 mg by mouth at bedtime. For depression/sleep) 30 tablet 0  . pantoprazole (PROTONIX) 40 MG tablet Take 1 tablet (40 mg total) by mouth 2 (two) times daily with a meal. For acid reflux 60 tablet 0  . Potassium Chloride ER 20 MEQ TBCR Take 20 mEq by mouth daily. 14 tablet 0  . thiamine 100 MG tablet Take 1 tablet (100 mg total) by mouth daily. 30 tablet 0    Musculoskeletal: Strength & Muscle Tone: within normal limits Gait & Station: normal Patient leans: N/A  Psychiatric Specialty Exam: Physical Exam  Constitutional: She is oriented to person, place, and time.  Neck: Normal range of motion.  Respiratory: Effort normal.  Musculoskeletal: Normal range of motion.  Neurological: She is alert and oriented to person, place, and time.  Skin: Skin is warm and dry.    ROS  Blood pressure (!) 148/97, pulse (!) 105, temperature 98.6 F (37 C), temperature source Oral, resp. rate 19, height 5' 2"  (1.575 m), weight 86.2 kg (190 lb), last menstrual period 09/15/2017, SpO2 100 %.Body mass index is 34.75 kg/m.  General Appearance: Casual  Eye Contact:  Good  Speech:  Clear and Coherent and Normal Rate  Volume:  Normal  Mood:  Depressed  Affect:  Congruent  Thought Process:  Coherent and Goal Directed  Orientation:  Full (Time, Place, and Person)  Thought Content:  Hallucinations: Command:  Telling her to shoot up school.  Denies  having a gun or access to gun  Suicidal Thoughts:  Yes.  with intent/plan  Homicidal Thoughts:  Denies homicidal ideation but states tha voices are telling her to shoot up school  Memory:  Immediate;   Fair Recent;   Fair Remote;   Fair  Judgement:  Fair  Insight:  Lacking and Shallow  Psychomotor Activity:  Normal  Concentration:  Concentration: Fair and Attention Span: Fair  Recall:  [employer] of Knowledge:  Fair  Language:  Good  Akathisia:  No  Handed:  Right  AIMS (if indicated):     Assets:  Communication Skills Desire for Improvement  ADL's:  Intact  Cognition:  WNL  Sleep:  Treatment Plan Summary: Medication management and Plan Inpatient psychiatric treatment Continue Abilify 10 mg daily, Cogentin 0.5 mg bid, Remeron 15 Unknown Q hs.  Start Lexapro 5 mg daily for major depression.  Reassess for stability tomorrow morning. Patient Carolyn stabilize before inpatient psychiatric bed available   Disposition: Recommend psychiatric Inpatient admission when medically cleared.  This service was provided via telemedicine using a 2-way, interactive audio and video technology.  Names of all persons participating in this telemedicine service and their role in this encounter. Name: Earleen Newport, NP Role: Telepsych  Name: Dr. Dwyane Dee Role:  Psychiatrist  Name: Dorothy Puffer Role: Patient   Name:  Role:     Earleen Newport, NP 09/17/2017 12:56 PM

## 2017-09-17 NOTE — ED Notes (Signed)
Pt apologized for behavior - stating she did not understand how she needs to take care of her whole body including taking her insulin - not just depression. States she will cooperate w/tx. VS's taken and CBG taken. States she is not able to eat at this time d/t nausea and asked for Zofran - given.

## 2017-09-17 NOTE — ED Notes (Signed)
Pt refusing Potassium tablet - requested Liquid.

## 2017-09-17 NOTE — ED Notes (Addendum)
Pt aware dinner tray on bedside table - states "I'm not going to eat it and I'm not taking any insulin so leave me alone!" - in loud tone. States "I came here for depression - not my diabetes!" Pt refused to have CBG retaken.

## 2017-09-17 NOTE — ED Notes (Addendum)
Tele-Psych Being Performed

## 2017-09-17 NOTE — ED Notes (Addendum)
Pt initially refused to have CBG checked and lab work drawn - after much encouragement - pt agreed. Pt stated "Why are y'all in here bugging me every 5 minutes?" Pt continues to c/o nausea - - refusing to eat breakfast - requesting Phenergan. Dr Juleen ChinaKohut aware and aware pt refusing to take Potassium - orders received to hold Novolog if does not eat and Phenergan 25mg  tab may be given if pt is cooperative.

## 2017-09-17 NOTE — ED Notes (Signed)
Breakfast tray ordered 

## 2017-09-17 NOTE — ED Notes (Signed)
Pt ate one saltine cracker with peanut butter. States that she cannot eat anymore.

## 2017-09-17 NOTE — ED Notes (Signed)
Phenergan given for c/o nausea as ordered. Pt voiced understanding and agreement w/tx plan and stated she will cooperate w/labs, CBG's, and Vs's that need to be performed.

## 2017-09-17 NOTE — ED Notes (Addendum)
Pt refused Novolog. Stating, "My sugar usually runs between 600 and 700, if it drops below 200 than I get shaky and I will not put myself through that." "I will decide if I receive Novolog when my lunch gets here."    Pt did take her Potassium with applesauce after much encouragement.

## 2017-09-17 NOTE — ED Provider Notes (Signed)
I was asked to go to evaluate patient as she is refusing to take any of her diabetic medications.  She has been nauseated most of the day, has required several doses of zofran and phenergan.  Her sugar has remained in the 200's all day.  When I asked patient about this she states "it will be alright".  Patient has had numerous ED visits for hyperglycemia/DKA secondary to medication non-compliance.  I have personally evaluated patient for this in the past as well.  She has been eating peanut butter and crackers in room so I suspect her sugar will continue to rise if not addressed.  I discussed with her that we need to address this now, she replies "well you can just admit me if it gets out of control."   Patient is due to be admitted to psych facility, discussed with patient that we are trying to avoid admission the hospital and uncontrolled blood sugars will deter her bed placement in psych facility as well.  After talking with myself and RN she has agreed to take her night time insulin.   Garlon HatchetSanders, Jayme Mednick M, PA-C 09/17/17 0044    Ward, Layla MawKristen N, DO 09/17/17 [phone]

## 2017-09-18 ENCOUNTER — Inpatient Hospital Stay (HOSPITAL_COMMUNITY)
Admission: AD | Admit: 2017-09-18 | Discharge: 2017-09-20 | DRG: 885 | Disposition: A | Discharge status: Home / Self Care | Payer: No Typology Code available for payment source | Source: Intra-hospital | Attending: Psychiatry | Admitting: Psychiatry

## 2017-09-18 ENCOUNTER — Encounter (HOSPITAL_COMMUNITY): Payer: Self-pay | Admitting: Internal Medicine

## 2017-09-18 ENCOUNTER — Encounter (HOSPITAL_COMMUNITY): Payer: Self-pay

## 2017-09-18 DIAGNOSIS — Z91419 Personal history of unspecified adult abuse: Secondary | ICD-10-CM | POA: Diagnosis not present

## 2017-09-18 DIAGNOSIS — F419 Anxiety disorder, unspecified: Secondary | ICD-10-CM | POA: Diagnosis not present

## 2017-09-18 DIAGNOSIS — Z9119 Patient's noncompliance with other medical treatment and regimen: Secondary | ICD-10-CM

## 2017-09-18 DIAGNOSIS — F209 Schizophrenia, unspecified: Principal | ICD-10-CM | POA: Diagnosis present

## 2017-09-18 DIAGNOSIS — Z794 Long term (current) use of insulin: Secondary | ICD-10-CM

## 2017-09-18 DIAGNOSIS — K219 Gastro-esophageal reflux disease without esophagitis: Secondary | ICD-10-CM | POA: Diagnosis present

## 2017-09-18 DIAGNOSIS — F329 Major depressive disorder, single episode, unspecified: Secondary | ICD-10-CM | POA: Diagnosis present

## 2017-09-18 DIAGNOSIS — Z79899 Other long term (current) drug therapy: Secondary | ICD-10-CM | POA: Diagnosis not present

## 2017-09-18 DIAGNOSIS — Z818 Family history of other mental and behavioral disorders: Secondary | ICD-10-CM

## 2017-09-18 DIAGNOSIS — I10 Essential (primary) hypertension: Secondary | ICD-10-CM | POA: Diagnosis present

## 2017-09-18 DIAGNOSIS — Z9114 Patient's other noncompliance with medication regimen: Secondary | ICD-10-CM | POA: Diagnosis not present

## 2017-09-18 DIAGNOSIS — F4312 Post-traumatic stress disorder, chronic: Secondary | ICD-10-CM | POA: Diagnosis present

## 2017-09-18 DIAGNOSIS — F17210 Nicotine dependence, cigarettes, uncomplicated: Secondary | ICD-10-CM | POA: Diagnosis present

## 2017-09-18 DIAGNOSIS — F17200 Nicotine dependence, unspecified, uncomplicated: Secondary | ICD-10-CM | POA: Diagnosis present

## 2017-09-18 DIAGNOSIS — E785 Hyperlipidemia, unspecified: Secondary | ICD-10-CM | POA: Diagnosis present

## 2017-09-18 DIAGNOSIS — Z590 Homelessness: Secondary | ICD-10-CM

## 2017-09-18 DIAGNOSIS — R45850 Homicidal ideations: Secondary | ICD-10-CM | POA: Diagnosis not present

## 2017-09-18 DIAGNOSIS — F333 Major depressive disorder, recurrent, severe with psychotic symptoms: Secondary | ICD-10-CM

## 2017-09-18 DIAGNOSIS — E119 Type 2 diabetes mellitus without complications: Secondary | ICD-10-CM | POA: Diagnosis present

## 2017-09-18 DIAGNOSIS — R45851 Suicidal ideations: Secondary | ICD-10-CM | POA: Diagnosis present

## 2017-09-18 LAB — BASIC METABOLIC PANEL
Anion gap: 14 (ref 5–15)
BUN: 6 mg/dL (ref 6–20)
CO2: 22 mmol/L (ref 22–32)
Calcium: 8.8 mg/dL — ABNORMAL LOW (ref 8.9–10.3)
Chloride: 98 mmol/L — ABNORMAL LOW (ref 101–111)
Creatinine, Ser: 0.68 mg/dL (ref 0.44–1.00)
GFR calc Af Amer: >60 mL/min (ref >60)
GFR calc non Af Amer: >60 mL/min (ref >60)
Glucose, Bld: 279 mg/dL — ABNORMAL HIGH (ref 65–99)
Potassium: 3.4 mmol/L — ABNORMAL LOW (ref 3.5–5.1)
Sodium: 134 mmol/L — ABNORMAL LOW (ref 135–145)

## 2017-09-18 LAB — GLUCOSE, CAPILLARY: Glucose-Capillary: 304 mg/dL — ABNORMAL HIGH (ref 65–99)

## 2017-09-18 LAB — MAGNESIUM: Magnesium: 1.7 mg/dL (ref 1.7–2.4)

## 2017-09-18 LAB — CBG MONITORING, ED
Glucose-Capillary: 156 mg/dL — ABNORMAL HIGH (ref 65–99)
Glucose-Capillary: 306 mg/dL — ABNORMAL HIGH (ref 65–99)
Glucose-Capillary: 310 mg/dL — ABNORMAL HIGH (ref 65–99)
Glucose-Capillary: 320 mg/dL — ABNORMAL HIGH (ref 65–99)

## 2017-09-18 MED ORDER — LORAZEPAM 2 MG/ML IJ SOLN: 0.0000 mg | Freq: Two times a day (BID) | INTRAMUSCULAR | Status: DC

## 2017-09-18 MED ORDER — HYDROXYZINE HCL 25 MG PO TABS
25.0000 mg | ORAL_TABLET | Freq: Three times a day (TID) | ORAL | Status: DC | PRN
  Administered 2017-09-18 – 2017-09-19 (×3): 25 mg via ORAL
  Filled 2017-09-18: qty 10
  Filled 2017-09-18 (×3): qty 1

## 2017-09-18 MED ORDER — ALUM & MAG HYDROXIDE-SIMETH 200-200-20 MG/5ML PO SUSP
30.0000 mL | ORAL | Status: DC | PRN
Start: 2017-09-18 — End: 2017-09-20

## 2017-09-18 MED ORDER — ESCITALOPRAM OXALATE 5 MG PO TABS
5.0000 mg | ORAL_TABLET | Freq: Every day | ORAL | Status: DC
  Administered 2017-09-19 – 2017-09-20 (×2): 5 mg via ORAL
  Filled 2017-09-18 (×3): qty 1

## 2017-09-18 MED ORDER — POTASSIUM CHLORIDE 20 MEQ/15ML (10%) PO SOLN
40.0000 meq | Freq: Two times a day (BID) | ORAL | Status: DC
  Administered 2017-09-18: 40 meq via ORAL
  Filled 2017-09-18: qty 30

## 2017-09-18 MED ORDER — INSULIN ASPART 100 UNIT/ML ~~LOC~~ SOLN
0.0000 [IU] | Freq: Three times a day (TID) | SUBCUTANEOUS | Status: DC
  Administered 2017-09-18 (×2): 15 [IU] via SUBCUTANEOUS
  Filled 2017-09-18 (×2): qty 1

## 2017-09-18 MED ORDER — INSULIN ASPART 100 UNIT/ML ~~LOC~~ SOLN: 0.0000 [IU] | Freq: Every day | SUBCUTANEOUS | Status: DC

## 2017-09-18 MED ORDER — MAGNESIUM HYDROXIDE 400 MG/5ML PO SUSP: 30.0000 mL | Freq: Every day | ORAL | Status: DC | PRN

## 2017-09-18 MED ORDER — ARIPIPRAZOLE 10 MG PO TABS
10.0000 mg | ORAL_TABLET | Freq: Every day | ORAL | Status: DC
  Administered 2017-09-19 – 2017-09-20 (×2): 10 mg via ORAL
  Filled 2017-09-18 (×3): qty 1

## 2017-09-18 MED ORDER — LORAZEPAM 1 MG PO TABS: 0.0000 mg | ORAL_TABLET | Freq: Two times a day (BID) | ORAL | Status: DC

## 2017-09-18 MED ORDER — AMLODIPINE BESYLATE 10 MG PO TABS
10.0000 mg | ORAL_TABLET | Freq: Every day | ORAL | Status: DC
  Administered 2017-09-20: 10 mg via ORAL
  Filled 2017-09-18 (×3): qty 1

## 2017-09-18 MED ORDER — INSULIN ASPART 100 UNIT/ML ~~LOC~~ SOLN: 3.0000 [IU] | Freq: Once | SUBCUTANEOUS | Status: DC

## 2017-09-18 MED ORDER — THIAMINE HCL 100 MG/ML IJ SOLN: 100.0000 mg | Freq: Every day | INTRAMUSCULAR | Status: DC

## 2017-09-18 MED ORDER — GLUCERNA SHAKE PO LIQD
237.0000 mL | Freq: Three times a day (TID) | ORAL | Status: DC
  Administered 2017-09-18 (×2): 237 mL via ORAL
  Filled 2017-09-18: qty 237

## 2017-09-18 MED ORDER — LORAZEPAM 2 MG/ML IJ SOLN: 0.0000 mg | Freq: Four times a day (QID) | INTRAMUSCULAR | Status: DC

## 2017-09-18 MED ORDER — INSULIN ASPART 100 UNIT/ML ~~LOC~~ SOLN
0.0000 [IU] | Freq: Three times a day (TID) | SUBCUTANEOUS | Status: DC
  Administered 2017-09-19 (×2): 9 [IU] via SUBCUTANEOUS
  Administered 2017-09-19 – 2017-09-20 (×2): 7 [IU] via SUBCUTANEOUS

## 2017-09-18 MED ORDER — MIRTAZAPINE 15 MG PO TABS
15.0000 mg | ORAL_TABLET | Freq: Every evening | ORAL | Status: DC | PRN
  Administered 2017-09-18 – 2017-09-19 (×2): 15 mg via ORAL
  Filled 2017-09-18 (×6): qty 1

## 2017-09-18 MED ORDER — INSULIN GLARGINE 100 UNIT/ML ~~LOC~~ SOLN
20.0000 [IU] | Freq: Every day | SUBCUTANEOUS | Status: DC
  Administered 2017-09-18: 20 [IU] via SUBCUTANEOUS

## 2017-09-18 MED ORDER — INSULIN ASPART 100 UNIT/ML ~~LOC~~ SOLN
0.0000 [IU] | Freq: Every day | SUBCUTANEOUS | Status: DC
  Administered 2017-09-18: 4 [IU] via SUBCUTANEOUS

## 2017-09-18 MED ORDER — ENSURE ENLIVE PO LIQD
237.0000 mL | Freq: Two times a day (BID) | ORAL | Status: DC
  Filled 2017-09-18: qty 237

## 2017-09-18 MED ORDER — LORAZEPAM 1 MG PO TABS
0.0000 mg | ORAL_TABLET | Freq: Four times a day (QID) | ORAL | Status: DC
  Administered 2017-09-18: 1 mg via ORAL
  Filled 2017-09-18: qty 1

## 2017-09-18 MED ORDER — INSULIN GLARGINE 100 UNIT/ML ~~LOC~~ SOLN
30.0000 [IU] | Freq: Two times a day (BID) | SUBCUTANEOUS | Status: DC
  Filled 2017-09-18: qty 0.3

## 2017-09-18 MED ORDER — TRAZODONE HCL 50 MG PO TABS: 50.0000 mg | ORAL_TABLET | Freq: Every evening | ORAL | Status: DC | PRN

## 2017-09-18 MED ORDER — VITAMIN B-1 100 MG PO TABS
100.0000 mg | ORAL_TABLET | Freq: Every day | ORAL | Status: DC
  Administered 2017-09-18: 100 mg via ORAL
  Filled 2017-09-18: qty 1

## 2017-09-18 MED ORDER — GABAPENTIN 300 MG PO CAPS
900.0000 mg | ORAL_CAPSULE | Freq: Three times a day (TID) | ORAL | Status: DC
  Administered 2017-09-18 (×2): 900 mg via ORAL
  Filled 2017-09-18 (×2): qty 3

## 2017-09-18 MED ORDER — ACETAMINOPHEN 325 MG PO TABS
650.0000 mg | ORAL_TABLET | Freq: Four times a day (QID) | ORAL | Status: DC | PRN
  Administered 2017-09-19: 650 mg via ORAL
  Filled 2017-09-18: qty 2

## 2017-09-18 NOTE — ED Provider Notes (Signed)
Contacted by psychiatry. They are concerned that patient is no elopement risk due to ongoing SI and cannot contract for safety. Patient does endorse depressive symptoms on evaluation. On review of charts she has endorsed wanted to run in front of a bus and is been noncompliant with medications and eating. She has been IVCd for patient safety so she can receive psychiatric care for depression with SI.   Carolyn Ayala, Carolyn Deland, MD 09/18/17 [phone]

## 2017-09-18 NOTE — ED Provider Notes (Signed)
42 yo F awaiting placement for suicidal ideation.  I was called due to a concern about the patient's low blood sugar and her twice daily dosing of subcu insulin.  The patient is refusing to eat breakfast therefore I had the nurse give just 3 units instead of 12.  Due to the complexity of this patient having type 1 diabetes and no firm outpatient management I will have the hospitalist consult for her diabetes management.   Carolyn Ayala, Carolyn Mordan, DO 09/18/17 [phone]

## 2017-09-18 NOTE — ED Notes (Signed)
Dinner tray arrived 

## 2017-09-18 NOTE — Progress Notes (Signed)
Received request from the nurse to visit the patient. The patient had requested the Chaplain to talk with her. The patient shared with me about the loss of her mother within the last month and health-related challenges the patient has had in the past and currently facing. I listened attentively to the patient and led her in prayer. I provided spiritual support and encouragement.    09/18/17 1830  Clinical Encounter Type  Visited With Patient  Visit Type Spiritual support  Referral From Nurse  Consult/Referral To Chaplain  Spiritual Encounters  Spiritual Needs Prayer  Stress Factors  Patient Stress Factors Exhausted;Loss;Major life changes   Melvyn NovasMichael Latrice Storlie

## 2017-09-18 NOTE — ED Notes (Signed)
Pt refused VS. NAD. Resp even and non-labored. Will continue to monitor.

## 2017-09-18 NOTE — Progress Notes (Signed)
CSW received a phone call from pt's [facility] RN, Huntley DecSara, who asked CSW to speak with pt about [facility]. Pt stated that [facility] and that it wasn't a helpful, positive experience for her. She denied SI or HI. "I can do this with outpatient, I know I can". She was reminded that she was assessed by TTS just a few hours prior and that both she and clinical staff felt she needed additional psychiatric treatment. CSW informed pt that she would [facility] with [facility] and contact Huntley DecSara, RN about her request. CSW collaborated with [facility] and as pt was deemed appropriate for inpatient hospitalization earlier today, it is recommended that an IVC petition be utilized. CSW notified [facility] RN, Huntley DecSara, who will discuss the IVC with the EDP.  CSW will continue to assist with placement needs.   Wells GuilesSarah Romanita Fager, LCSW, LCAS Disposition CSW [facility] [phone]

## 2017-09-18 NOTE — BH Assessment (Signed)
BHH Assessment Progress Note      Patient states that she continues to be depressed and states that she continues to have suicidal thoughts.  Patient states that she has hit rock bottom.  Patient states that she is an alcoholic as well and states that she would like to get help for her alcoholism.  Patient states that she has most recently been living with her niece, but she cannot return there because everyone there is drinking.  Patient states that she would like to have longer term placement.  Patient is also requesting medications to help her with her racing thoughts.  TTS will speak to the FNP to see if any orders can be given to help with her racing thoughts.  Continued inpatient is recommended.

## 2017-09-18 NOTE — Progress Notes (Signed)
Inpatient Diabetes Program Recommendations  AACE/ADA: New Consensus Statement on Inpatient Glycemic Control (2015)  Target Ranges:  Prepandial:   less than 140 mg/dL      Peak postprandial:   less than 180 mg/dL (1-2 hours)      Critically ill patients:  140 - 180 mg/dL   Review of Glycemic Control  Diabetes history: DM 1 Outpatient Diabetes medications: Lantus 60 units BID, Novolog 12 units tid meal coverage Current orders for Inpatient glycemic control: Lantus 60 units BID, Novolog 12 units tid meal coverage  Inpatient Diabetes Program Recommendations:    Add Novolog Sensitive Correction 0-9 units tid + Novolog HS scale. Patient will still get coverage even if she is not eating.   Consider parameters on the meal coverage, give only if patient consumes at least 50% of meal and glucose is at least 80 mg/dl.  Thanks,  Carolyn DeemShannon Adamae Ricklefs RN, MSN, BC-ADM, [facility] Inpatient Diabetes Coordinator Team Pager [phone] (8a-5p)

## 2017-09-18 NOTE — Progress Notes (Signed)
[facility] Fransisca KaufmannLaura Davis, NP is the accepting provider. Dr. Altamese Carolinaainville is the attending provider.  Call report to [phone].   Maralyn SagoSarah, RN @[facility]  ED notified. Pt is voluntary and may be transported by [facility]. [facility].  Wells GuilesSarah Neema Barreira, LCSW, LCAS Disposition CSW [facility] [phone]

## 2017-09-18 NOTE — ED Notes (Signed)
Breakfast tray ordered 

## 2017-09-18 NOTE — Consult Note (Signed)
ER Consultation   Carolyn Ayala [employer] ZOX:[id number] DOB: 1976-02-12 DOA: 09/16/2017  PCP: Patient, No Pcp Per Consultants:  None Patient coming from: Homeless; NOK: "No one"  Chief Complaint: suicidal ideation  HPI: Carolyn Ayala is a 42 y.o. female with medical history significant of DM with h/o multiple admissions for DKA; HTN; HLD; and severe depression with psychosis presenting with SI. Suicidal thoughts for a couple of weeks.  Has h/o the same.  She checks her sugars sometimes.  Unsure how long she has been diabetic.  She has been admitted for unstable sugars, last admission 4/1-5/19.  She uses only sliding scale insulin at home and is not sure what her scale is.  She can't eat because she has been vomiting.  She has been vomiting for about a week.  She isn't eating because if she was her sugars would be sky high and she would go into DKA.   She reports excessive ETOH use and reports she is starting today to feel jittery.  Last drink was 4 days ago.   ED Course:   Type 1 DM - discharged without PCP f/u.  Blood sugar not high, doesn't want to eat, unsure what to with her insulin.  ?Better blood sugar regimen until she is placed for psych reasons.  Review of Systems: As per HPI; otherwise review of systems reviewed and negative.   Ambulatory Status:  Ambulates without assistance  Past Medical History:  Diagnosis Date  . Depression   . Diabetes mellitus without complication (HCC)   . High cholesterol   . Hypertension   . Mental disorder   . Noncompliance     Past Surgical History:  Procedure Laterality Date  . CESAREAN SECTION      Social History   Socioeconomic History  . Marital status: Single    Spouse name: Not on file  . Number of children: Not on file  . Years of education: Not on file  . Highest education level: Not on file  Occupational History  . Not on file  Social Needs  . Financial resource strain: Not on file  . Food insecurity:    Worry:  Not on file    Inability: Not on file  . Transportation needs:    Medical: Not on file    Non-medical: Not on file  Tobacco Use  . Smoking status: Current Every Day Smoker    Packs/day: 2.00    Start date: 29  . Smokeless tobacco: Current User  Substance and Sexual Activity  . Alcohol use: Yes    Comment:  Daily use -- 2 x 1/5 daily  . Drug use: Yes    Frequency: 7.0 times per week    Types: Barbituates, Benzodiazepines    Comment: Pt denied to author  . Sexual activity: Never  Lifestyle  . Physical activity:    Days per week: Not on file    Minutes per session: Not on file  . Stress: Not on file  Relationships  . Social connections:    Talks on phone: Not on file    Gets together: Not on file    Attends religious service: Not on file    Active member of club or organization: Not on file    Attends meetings of clubs or organizations: Not on file    Relationship status: Not on file  . Intimate partner violence:    Fear of current or ex partner: Not on file    Emotionally abused: Not on file  Physically abused: Not on file    Forced sexual activity: Not on file  Other Topics Concern  . Not on file  Social History Narrative   ** Merged History Encounter **        No Known Allergies  Family History  Problem Relation Age of Onset  . Depression Mother     Prior to Admission medications   Medication Sig Start Date End Date Taking? Authorizing Provider  amLODipine (NORVASC) 10 MG tablet Take 1 tablet (10 mg total) by mouth daily. 09/16/17  Yes Burnadette PopAdhikari, Amrit, MD  ARIPiprazole (ABILIFY) 10 MG tablet Take 1 tablet (10 mg total) by mouth daily. For mood control Patient taking differently: Take 30 mg by mouth daily. For mood control 08/08/17  Yes Nwoko, Nicole KindredAgnes I, NP  atorvastatin (LIPITOR) 40 MG tablet Take 40 mg by mouth daily.   Yes [provider]  benztropine (COGENTIN) 0.5 MG tablet Take 1 tablet (0.5 mg total) by mouth 2 (two) times daily. For prevention of  drug induced tremors 08/07/17  Yes Nwoko, Nicole KindredAgnes I, NP  busPIRone (BUSPAR) 15 MG tablet Take 1 tablet (15 mg total) by mouth 3 (three) times daily. For anxiety 08/07/17  Yes Armandina StammerNwoko, Agnes I, NP  folic acid (FOLVITE) 1 MG tablet Take 1 tablet (1 mg total) by mouth daily. 09/15/17 10/15/17 Yes Burnadette PopAdhikari, Amrit, MD  gabapentin (NEURONTIN) 300 MG capsule Take 2 capsules (600 mg total) by mouth 3 (three) times daily. For agitation/diabetic neuropathic Patient taking differently: Take 900 mg by mouth 3 (three) times daily. For agitation/diabetic neuropathic 08/07/17  Yes Armandina StammerNwoko, Agnes I, NP  insulin aspart (NOVOLOG) 100 UNIT/ML injection Inject 12 Units into the skin 3 (three) times daily before meals. 09/15/17  Yes Adhikari, Willia CrazeAmrit, MD  insulin glargine (LANTUS) 100 UNIT/ML injection Inject 0.6 mLs (60 Units total) into the skin 2 (two) times daily. 09/15/17  Yes Burnadette PopAdhikari, Amrit, MD  lisinopril (PRINIVIL,ZESTRIL) 10 MG tablet Take 1 tablet (10 mg total) by mouth daily. For high blood pressure 09/15/17  Yes Adhikari, Willia CrazeAmrit, MD  metoCLOPramide (REGLAN) 10 MG tablet Take 1 tablet (10 mg total) by mouth every 6 (six) hours as needed for nausea. For nausea 09/15/17  Yes Burnadette PopAdhikari, Amrit, MD  mirtazapine (REMERON) 15 MG tablet Take 1 tablet (15 mg total) by mouth at bedtime. For depression/sleep Patient taking differently: Take 30 mg by mouth at bedtime. For depression/sleep 08/07/17  Yes Armandina StammerNwoko, Agnes I, NP  pantoprazole (PROTONIX) 40 MG tablet Take 1 tablet (40 mg total) by mouth 2 (two) times daily with a meal. For acid reflux 09/15/17  Yes Burnadette PopAdhikari, Amrit, MD  Potassium Chloride ER 20 MEQ TBCR Take 20 mEq by mouth daily. 09/15/17  Yes Burnadette PopAdhikari, Amrit, MD  thiamine 100 MG tablet Take 1 tablet (100 mg total) by mouth daily. 09/16/17  Yes Burnadette PopAdhikari, Amrit, MD    Physical Exam: Vitals:   09/17/17 0530 09/17/17 1046 09/17/17 1232 09/18/17 0656  BP: 122/90 135/90 (!) 148/97 (!) 134/93  Pulse: 97  (!) 105 (!) 107  Resp: 19   20  Temp: 98  F (36.7 C)  98.6 F (37 C) 98.1 F (36.7 C)  TempSrc: Oral  Oral Oral  SpO2: 100%   100%  Weight:      Height:         General: Appears calm and comfortable and is NAD Eyes:  EOMI, normal lids, iris ENT:  grossly normal hearing, lips & tongue, mmm Neck:  no LAD, masses or thyromegaly  Cardiovascular:  RRR, no m/r/g. No LE edema.  Respiratory:   CTA bilaterally with no wheezes/rales/rhonchi.  Normal respiratory effort. Abdomen:  soft, NT, ND, NABS Back:   normal alignment, no CVAT Skin:  no rash or induration seen on limited exam Psychiatric:  depressed mood and affect, speech fluent and appropriate, AOx3    Radiological Exams on Admission: Dg Chest 2 View  Result Date: 09/16/2017 CLINICAL DATA:  Cough and shortness of Breath EXAM: CHEST - 2 VIEW COMPARISON:  09/07/2017 FINDINGS: The heart size and mediastinal contours are within normal limits. Both lungs are clear. The visualized skeletal structures are unremarkable. IMPRESSION: No active cardiopulmonary disease. Electronically Signed   By: Alcide Clever M.D.   On: 09/16/2017 18:25    EKG: not done   Labs on Admission: I have personally reviewed the available labs and imaging studies at the time of the admission.  Pertinent labs:   Glucose 195, 228, 187, 128, 156 K+ 2.8   Assessment/Plan Principal Problem:   Depression, major, recurrent, severe with psychosis (HCC) Active Problems:   IDDM (insulin dependent diabetes mellitus) (HCC)   Hyperlipidemia   Alcohol use disorder, moderate, dependence (HCC)   HTN (hypertension)   Hypokalemia   Tobacco dependence   Psychotic depression with SI -For inpatient psychiatric treatment  IDDM -Patient with recent admission for DKA -Her last A1c was 11.6, indicating poor baseline control (08/04/17) -Currently with reasonable control and no evidence of DKA -Patient is refusing to eat and so TRH consulted to assist with DM control -She is ordered to take Lantus 60 units BID at  home but reports using insulin only on a sliding scale basis -In general, patients who are not eating can continue their basal insulin dose -Since she is capable of eating and is being closely monitored, it is reasonable to continue her home insulin dose; however, since she reports not taking this regularly, would decrease dose by 1/2 for now -Given her lack of enthusiasm for meals, would suggest holing qAC dosing of short-acting insulin for now -Continue with resistant-scale SSI   HTN -Continue home Norvasc and Lisinopril  HLD -Continue Lipitor  ETOH dependence -Reports drinking 2 x 1/5 per day -Reports last drink was 4 days ago -This would be fairly far outside the usual window to start with DTs -She does not appear to be particularly anxious and her vital signs are normal other than elevated BP -No CIWA for now but could start if increasing concern for ETOH withdrawal arises  Hypokalemia -She appears to have been repleted with 140 mEq since arrival and continues to have hypokalemia -She received 60 mEq PO KCl this AM -Will check Mag level -Will reorder BMP at 1800, replete as needed  Tobacco dependence -Encourage cessation.  This was discussed with the patient and should be reviewed on an ongoing basis.   -Patch ordered at patient request.  TRH appreciates this consultation.  At this time, the patient appears to be medically stable.  Please reconsult if additional assistance is needed prior to the time of transfer to inpatient behavioral health.    Jonah Blue MD [facility]  If note is complete, please contact covering daytime or nighttime physician. [email or web address] Password [facility]  09/18/2017, 9:53 AM

## 2017-09-18 NOTE — ED Notes (Signed)
[facility]; pt refusing to go there as she has "already been there"

## 2017-09-18 NOTE — BH Assessment (Signed)
BHH Assessment Progress Note    Spoke with Carolyn KaufmannLaura Davis, NP who is hesitant to provide an order medications to treat her racing thoughts without knowing their origin.

## 2017-09-18 NOTE — ED Notes (Addendum)
Pt refusing all treatments. "I don't care what my blood sugar is, why do you?". Sat down with pt and talked with her and reasoned with her. She agrees for this RN to check her blood sugar. "States I just don't care". Pt still c/o nausea. Will medicate with zofran.

## 2017-09-18 NOTE — ED Notes (Signed)
Pt refusing lantus. States that she will not take it no matter what anyone says. "I know my rights". Polite will not budge on taking her insulin. Will continue to monitor. Pt continues to c/o nausea. No vomiting noted at any point while pt has been here. Complains about the zofran, "I want the phenergan".

## 2017-09-19 ENCOUNTER — Other Ambulatory Visit: Payer: Self-pay

## 2017-09-19 DIAGNOSIS — G4700 Insomnia, unspecified: Secondary | ICD-10-CM

## 2017-09-19 DIAGNOSIS — Z818 Family history of other mental and behavioral disorders: Secondary | ICD-10-CM

## 2017-09-19 DIAGNOSIS — F209 Schizophrenia, unspecified: Principal | ICD-10-CM

## 2017-09-19 DIAGNOSIS — F419 Anxiety disorder, unspecified: Secondary | ICD-10-CM

## 2017-09-19 DIAGNOSIS — F17210 Nicotine dependence, cigarettes, uncomplicated: Secondary | ICD-10-CM

## 2017-09-19 DIAGNOSIS — R45850 Homicidal ideations: Secondary | ICD-10-CM

## 2017-09-19 DIAGNOSIS — R450 Nervousness: Secondary | ICD-10-CM

## 2017-09-19 DIAGNOSIS — R45851 Suicidal ideations: Secondary | ICD-10-CM

## 2017-09-19 LAB — RAPID URINE DRUG SCREEN, HOSP PERFORMED
Amphetamines: NOT DETECTED
Barbiturates: NOT DETECTED
Benzodiazepines: POSITIVE — AB
Cocaine: NOT DETECTED
Opiates: NOT DETECTED
Tetrahydrocannabinol: NOT DETECTED

## 2017-09-19 LAB — GLUCOSE, CAPILLARY
Glucose-Capillary: 340 mg/dL — ABNORMAL HIGH (ref 65–99)
Glucose-Capillary: 354 mg/dL — ABNORMAL HIGH (ref 65–99)
Glucose-Capillary: 436 mg/dL — ABNORMAL HIGH (ref 65–99)
Glucose-Capillary: 406 mg/dL — ABNORMAL HIGH (ref 65–99)

## 2017-09-19 LAB — HEMOGLOBIN A1C
Hgb A1c MFr Bld: 11 % — ABNORMAL HIGH (ref 4.8–5.6)
Mean Plasma Glucose: 269 mg/dL

## 2017-09-19 LAB — PREGNANCY, URINE: Preg Test, Ur: NEGATIVE

## 2017-09-19 LAB — TSH: TSH: 1.375 u[IU]/mL (ref 0.350–4.500)

## 2017-09-19 MED ORDER — BUSPIRONE HCL 15 MG PO TABS
15.0000 mg | ORAL_TABLET | Freq: Three times a day (TID) | ORAL | Status: DC
  Administered 2017-09-19 – 2017-09-20 (×3): 15 mg via ORAL
  Filled 2017-09-19 (×6): qty 1

## 2017-09-19 MED ORDER — BENZTROPINE MESYLATE 0.5 MG PO TABS
0.5000 mg | ORAL_TABLET | Freq: Two times a day (BID) | ORAL | Status: DC
  Administered 2017-09-19 – 2017-09-20 (×2): 0.5 mg via ORAL
  Filled 2017-09-19 (×4): qty 1

## 2017-09-19 MED ORDER — INSULIN GLARGINE 100 UNIT/ML ~~LOC~~ SOLN
40.0000 [IU] | Freq: Every day | SUBCUTANEOUS | Status: DC
  Administered 2017-09-19: 40 [IU] via SUBCUTANEOUS

## 2017-09-19 MED ORDER — ATORVASTATIN CALCIUM 40 MG PO TABS
40.0000 mg | ORAL_TABLET | Freq: Every day | ORAL | Status: DC
  Administered 2017-09-19 – 2017-09-20 (×2): 40 mg via ORAL
  Filled 2017-09-19 (×3): qty 1

## 2017-09-19 MED ORDER — METOCLOPRAMIDE HCL 10 MG PO TABS: 10.0000 mg | ORAL_TABLET | Freq: Four times a day (QID) | ORAL | Status: DC | PRN

## 2017-09-19 MED ORDER — INSULIN ASPART 100 UNIT/ML ~~LOC~~ SOLN
12.0000 [IU] | Freq: Once | SUBCUTANEOUS | Status: AC
  Administered 2017-09-19: 12 [IU] via SUBCUTANEOUS

## 2017-09-19 MED ORDER — GABAPENTIN 300 MG PO CAPS
600.0000 mg | ORAL_CAPSULE | Freq: Three times a day (TID) | ORAL | Status: DC
  Administered 2017-09-19 – 2017-09-20 (×3): 600 mg via ORAL
  Filled 2017-09-19 (×6): qty 2

## 2017-09-19 MED ORDER — VITAMIN B-1 100 MG PO TABS
100.0000 mg | ORAL_TABLET | Freq: Every day | ORAL | Status: DC
  Administered 2017-09-19 – 2017-09-20 (×2): 100 mg via ORAL
  Filled 2017-09-19 (×3): qty 1

## 2017-09-19 MED ORDER — INSULIN ASPART 100 UNIT/ML ~~LOC~~ SOLN
6.0000 [IU] | Freq: Three times a day (TID) | SUBCUTANEOUS | Status: DC
  Administered 2017-09-20: 6 [IU] via SUBCUTANEOUS

## 2017-09-19 MED ORDER — FOLIC ACID 1 MG PO TABS
1.0000 mg | ORAL_TABLET | Freq: Every day | ORAL | Status: DC
  Administered 2017-09-19 – 2017-09-20 (×2): 1 mg via ORAL
  Filled 2017-09-19 (×3): qty 1

## 2017-09-19 MED ORDER — POTASSIUM CHLORIDE 20 MEQ/15ML (10%) PO SOLN
20.0000 meq | Freq: Every day | ORAL | Status: DC
  Administered 2017-09-19 – 2017-09-20 (×2): 20 meq via ORAL
  Filled 2017-09-19 (×3): qty 15

## 2017-09-19 MED ORDER — PANTOPRAZOLE SODIUM 40 MG PO TBEC
40.0000 mg | DELAYED_RELEASE_TABLET | Freq: Two times a day (BID) | ORAL | Status: DC
  Administered 2017-09-19 – 2017-09-20 (×2): 40 mg via ORAL
  Filled 2017-09-19 (×4): qty 1

## 2017-09-19 MED ORDER — LISINOPRIL 10 MG PO TABS
10.0000 mg | ORAL_TABLET | Freq: Every day | ORAL | Status: DC
  Administered 2017-09-19 – 2017-09-20 (×2): 10 mg via ORAL
  Filled 2017-09-19 (×3): qty 1

## 2017-09-19 MED ORDER — POTASSIUM CHLORIDE CRYS ER 20 MEQ PO TBCR
20.0000 meq | EXTENDED_RELEASE_TABLET | Freq: Every day | ORAL | Status: DC
  Filled 2017-09-19 (×3): qty 1

## 2017-09-19 NOTE — BHH Suicide Risk Assessment (Signed)
[facility] Admission Suicide Risk Assessment   Nursing information obtained from:    Demographic factors:    Current Mental Status:    Loss Factors:    Historical Factors:    Risk Reduction Factors:     Total Time spent with patient: 1 hour Principal Problem: Schizophrenia (HCC) Diagnosis:   Patient Active Problem List   Diagnosis Date Noted  . Tobacco dependence [F17.200] 09/18/2017  . Schizophrenia (HCC) [F20.9] 09/18/2017  . DKA (diabetic ketoacidoses) (HCC) [E13.10] 09/12/2017  . Suicidal thoughts [R45.851]   . Hypokalemia [E87.6]   . HTN (hypertension) [I10] 09/24/2015  . Hyperlipidemia [E78.5] 08/10/2015  . Alcohol use disorder, moderate, dependence (HCC) [F10.20] 08/10/2015  . Neuropathic pain [M79.2] 08/08/2015  . 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 Generalized anxiety disorder [F41.1] 02/13/2013  . Chronic posttraumatic stress disorder [F43.12] 02/13/2013  . IDDM (insulin dependent diabetes mellitus) (HCC) [E11.9, Z79.4] 02/08/2013   Subjective Data:   Carolyn Ayala is a 42 y/o F with history of schizophrenia who was [facility] placed ED after she presented with worsening depression, SI, and CAH telling her to "shoot up a school." Pt had been non-adherent to her outpatient treatment regimen. She was transferred to [facility] for additional treatment and stabilization.   Upon initial presentation, pt shares, "I've been having suicidal thoughts, depression, and thoughts to hurt myself. My momma passed away and I haven't been right since then." Pt shares that she initially went to the ED due to poor management of her DMII and she required treatment in the ICU for ketoacidosis. She endorses depressed mood, poor sleep with initial insomnia, guilty feelings, anhedonia, decreased energy, poor concentration and fluctuant appetite. She endorses SI without specific plan at this time. She denies HI. She denies VH. She endorses CAH which  tell her to "shoot up a school." She does not have a specific target or access to a firearms. She reports she does not want to do this command, and she is able to safety plan and stop herself from taking action. She denies symptoms bipolar disorder, OCD, and PTSD. She denies illicit substance use.  Discussed with patient about treatment options. She is in agreement to be resumed on previous medicaitons of abilify, lexapro, remeron, vistaril, and gabapentin. She will also be resumed on sliding scale insulin for treatment of DMII.   Continued Clinical Symptoms:  Alcohol Use Disorder Identification Test Final Score (AUDIT): 36 The "Alcohol Use Disorders Identification Test", Guidelines for Use in Primary Care, Second Edition.  World Science writer [facility]). Score between 0-7:  no or low risk or alcohol related problems. Score between 8-15:  moderate risk of alcohol related problems. Score between 16-19:  high risk of alcohol related problems. Score 20 or above:  warrants further diagnostic evaluation for alcohol dependence and treatment.   CLINICAL FACTORS:   Severe Anxiety and/or Agitation Depression:   Impulsivity Insomnia Severe Schizophrenia:   Command hallucinatons Previous Psychiatric Diagnoses and Treatments Medical Diagnoses and Treatments/Surgeries   Musculoskeletal: Strength & Muscle Tone: within normal limits Gait & Station: normal Patient leans: N/A  Psychiatric Specialty Exam: Physical Exam  Nursing note and vitals reviewed.   Review of Systems  Constitutional: Negative for chills and fever.  Respiratory: Negative for cough and shortness of breath.   Cardiovascular: Negative for chest pain.  Gastrointestinal: Negative for abdominal pain, heartburn, nausea and vomiting.  Psychiatric/Behavioral: Positive for depression, hallucinations and suicidal ideas. The patient is nervous/anxious. The patient does not  have insomnia.     Blood pressure 120/63, pulse (!) 130,  temperature 98.9 F (37.2 C), temperature source Oral, resp. rate 18, height 5\' 2"  (1.575 m), weight 88.5 kg (195 lb), last menstrual period 09/15/2017.Body mass index is 35.67 kg/m.  General Appearance: Casual and Fairly Groomed  Eye Contact:  Good  Speech:  Clear and Coherent and Normal Rate  Volume:  Normal  Mood:  Anxious and Depressed  Affect:  Congruent and Flat  Thought Process:  Coherent and Goal Directed  Orientation:  Full (Time, Place, and Person)  Thought Content:  Hallucinations: Auditory Command:  to "shoot up a school"  Suicidal Thoughts:  Yes.  without intent/plan  Homicidal Thoughts:  No  Memory:  Immediate;   Fair Recent;   Fair Remote;   Fair  Judgement:  Poor  Insight:  Lacking  Psychomotor Activity:  Normal  Concentration:  Concentration: Fair  Recall:  FiservFair  Fund of Knowledge:  Fair  Language:  Fair  Akathisia:  No  Handed:    AIMS (if indicated):     Assets:  Manufacturing systems engineerCommunication Skills Resilience Social Support  ADL's:  Intact  Cognition:  WNL  Sleep:  Number of Hours: 6.25      COGNITIVE FEATURES THAT CONTRIBUTE TO RISK:  None    SUICIDE RISK:   Moderate:  Frequent suicidal ideation with limited intensity, and duration, some specificity in terms of plans, no associated intent, good self-control, limited dysphoria/symptomatology, some risk factors present, and identifiable protective factors, including available and accessible social support.  PLAN OF CARE:   -Admit to inpatient level of care  -Schizophrenia   -Continue abilify 10mg  po qday   -Continue lexapro 5mg  po qday   - Continue remeron 15mg  po qday  -Anxiety    -Continue gabapentin 600mg  po TID   -Continue vistaril 25mg  po TID prn anxiety   -Continue buspar 15mg  po TID  -HTN   - Continue norvasc 10mg  po qDay   -Continue lisinopril 10mg  po qDay  -HLD   - Continue lipitor 40mg  po qDay  -EPS   - Continue cogentin 0.5mg  po BID  - DMII   -Continue SSI with novolog and Lantus 50mg   [place] [facility]  - Encourage participation in groups and therapeutic milieu  -Disposition planning will be ongoing  I certify that inpatient services furnished can reasonably be expected to improve the patient's condition.   Micheal Likenshristopher T Reis Goga, MD 09/19/2017, 4:56 PM

## 2017-09-19 NOTE — Progress Notes (Signed)
Adult Psychoeducational Group Note  Date:  09/19/2017 Time:  8:44 PM  Group Topic/Focus:  Wrap-Up Group:   The focus of this group is to help patients review their daily goal of treatment and discuss progress on daily workbooks.  Participation Level:  Active  Participation Quality:  Appropriate  Affect:  Appropriate  Cognitive:  Appropriate  Insight: Appropriate  Engagement in Group:  Engaged  Modes of Intervention:  Discussion  Additional Comments:  The patient expressed that she rates today a 10.The patient also said that from group she has a positive attitude.  Octavio Mannshigpen, Keylah Darwish Lee 09/19/2017, 8:44 PM

## 2017-09-19 NOTE — Progress Notes (Signed)
DAR NOTE: Patient presents with flat affect and depressed mood.  Reports suicidal thoughts but contract for safety.  States thoughts comes and go.  Reports racing thoughts and voices off and on.  Described energy level as low and concentration as poor.  Rates depression at 10, hopelessness at 10, and anxiety at 10.  Maintained on routine safety checks.  Medications given as prescribed.  Support and encouragement offered as needed.  Attended group and participated.  States goal for today is "talk to doctor."  Patient visible in milieu with minimal interaction with staff and peers.  Vistaril 25 mg given for complain of anxiety with good effect.   Patient is safe on the unit.

## 2017-09-19 NOTE — Progress Notes (Signed)
D: Pt denies SI/HI/AVH. Pt is pleasant and cooperative. Pt stated [place] got  [place] Pt stated she was excited and happy.   A: Pt was offered support and encouragement. Pt was given scheduled medications. Pt was encourage to attend groups. Q 15 minute checks were done for safety.   R:Pt attends groups and interacts well with peers and staff. Pt is taking medication. Pt has no complaints at this time .Pt receptive to treatment and safety maintained on unit.

## 2017-09-19 NOTE — Progress Notes (Signed)
Recreation Therapy Notes  INPATIENT RECREATION THERAPY ASSESSMENT  Patient admitted to unit 4.8.19. Due to admission within last year, no new assessment conducted at this time. Last assessment conducted 2.21.19. Patient reports no changes in stressors from previous admission.   Patient denies SI, HI, AVH at this time.   Information found below from assessment conducted 2.21.19  Patient Details Name: Carolyn Ayala MRN: [id number] DOB: [birth year] Today's Date: 08/03/2017                                                              Information Obtained From: Patient  Able to Participate in Assessment/Interview: Yes  Patient Presentation: Alert, Oriented  Reason for Admission (Per Patient): Suicidal Ideation  Patient Stressors: Death, Alcohol  Pt stated her mother died 15 days ago.  Coping Skills:   Isolation, Avoidance, Arguments, Aggression, Substance Abuse, Prayer, Hot Bath/Shower, Journal, Read, Talk, Art, Music, TV, Word searches  Leisure Interests (2+):  Games - Word-search, Individual - Reading, Individual - Other (Comment)(Cooking)  Frequency of Recreation/Participation: Daily  Awareness of Community Resources:  No  Expressed Interest in [employer] CorporationCommunity Resource Information: Yes  [place]  Patient Main Form of Transportation: Therapist, musicublic Transportation  Patient Strengths:  Faith in God  Patient Identified Areas of Improvement:  Drinking; Attitude  Patient Goal for Hospitalization:  "Coping skills"  Current SI (including self-harm):  No  Current HI:  No  Current AVH: No  Staff Intervention Plan: Group Attendance  Consent to Intern Participation: N/A  Sheryle Hailarian Rhen Kawecki, Recreation Therapy Intern   Sheryle HailDarian Gianny Killman 09/19/2017, 1:28 PM

## 2017-09-19 NOTE — Progress Notes (Signed)
Recreation Therapy Notes  Date: 4.9.19 Time: 10:00 a.m. Location: [address] Dayroom   Group Topic: Communication   Goal Area(s) Addresses:  Goal 1.1: To improve communication  - Group will communicate with peers during group session    - Group will identify the importance of healthy communication  - Group will answer at least three questions during Recreation Therapy tx  Behavioral Response: Engaged   Intervention: Game   Activity: Jenga: Patients played Jenga as normal. Once a patient pulled out a [place] piece, based on the color of the skittle, the patient answered a specific question for that color.   Education: Communication, Team-Work   Education Outcome: Acknowledges education  Clinical Observations/Feedback: Patient attended and participated appropriately in Recreation Therapy group treatment successfully engaging in group activity. Patient was able to communicate with peers in a appropriate manner. Patient was able to identify the importance of communication. Patient was able to answer at least three questions about communication during Recreation Therapy Group treatment. Patient successfully met Goal 1.1 (see above).   Ranell Patrick, Recreation Therapy Intern   Ranell Patrick 09/19/2017 11:18 AM
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
## 2017-09-19 NOTE — [facility] Group Notes (Signed)
LCSW Group Therapy Note   09/19/2017 1:15pm   Type of Therapy and Topic:  Group Therapy:  Positive Affirmations   Participation Level:  None  Description of Group: This group addressed positive affirmation toward self and others. Patients went around the room and identified two positive things about themselves and two positive things about a peer in the room. Patients reflected on how it felt to share something positive with others, to identify positive things about themselves, and to hear positive things from others. Patients were encouraged to have a daily reflection of positive characteristics or circumstances.  Therapeutic Goals 1. Patient will verbalize two of their positive qualities 2. Patient will demonstrate empathy for others by stating two positive qualities about a peer in the group 3. Patient will verbalize their feelings when voicing positive self affirmations and when voicing positive affirmations of others 4. Patients will discuss the potential positive impact on their wellness/recovery of focusing on positive traits of self and others. Summary of Patient Progress:  Stayed the entire time, engaged throughout. Declined to contribute to the discussion.  Therapeutic Modalities Cognitive Behavioral Therapy Motivational Interviewing  Ida RogueRodney B Ariell Gunnels, KentuckyLCSW 09/19/2017 2:43 PM

## 2017-09-19 NOTE — Tx Team (Signed)
Initial Treatment Plan 09/19/2017 1:20 AM Carolyn Ayala Kazee ZOX:[id number]    PATIENT STRESSORS: Financial difficulties Loss of mother Marital or family conflict Medication change or noncompliance   PATIENT STRENGTHS: Capable of independent living General fund of knowledge   PATIENT IDENTIFIED PROBLEMS: Risk for suicide  Loss of mother  Non-compliance with insulin  "coping skills"               DISCHARGE CRITERIA:  Improved stabilization in mood, thinking, and/or behavior Verbal commitment to aftercare and medication compliance  PRELIMINARY DISCHARGE PLAN: Attend aftercare/continuing care group Outpatient therapy  PATIENT/FAMILY INVOLVEMENT: This treatment plan has been presented to and reviewed with the patient, Carolyn Ayala Stege.  The patient and family have been given the opportunity to ask questions and make suggestions.  Delos HaringPhillips, Honora Searson A, RN 09/19/2017, 1:20 AM

## 2017-09-19 NOTE — Plan of Care (Signed)
  Problem: Education: Goal: Mental status will improve Outcome: Progressing   Problem: Activity: Goal: Interest or engagement in activities will improve Outcome: Progressing   Problem: Coping: Goal: Ability to verbalize frustrations and anger appropriately will improve Outcome: Progressing   Problem: Safety: Goal: Periods of time without injury will increase Outcome: Progressing   Problem: Coping: Goal: Coping ability will improve Outcome: Progressing
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
## 2017-09-19 NOTE — Progress Notes (Addendum)
Admission Note:  [age] yr female who presents IVC in no acute distress for the treatment of SI and Depression. Pt appears flat and depressed. Pt was calm and cooperative with admission process. Pt stated her mother passed away 1.5 months ago ( Per previous counselors notes pt mother has past away in 2016, 2017, and 2019). Pt has stated per prior notes that she was referring to her grandmother. Pt presents as poor historian.   and pt has been decompensating ever since. [facility] and has a Hx of uncontrolled CBG . Pt initially refused to get her blood sugar taken on admission , but after convincing from Clinical research associatewriter pt agreed. Pt has alias Myrla HalstedDenise williams with MRN [id number] , which writer knows her from [facility] in the past .    A:Skin was assessed [place] Dec(Sara) and found to be clear of any abnormal marks apart from multiple tattoos. PT searched and no contraband found, POC and unit policies explained and understanding verbalized. Consents obtained. Food and fluids offered, and accepted.  R: Pt had no additional questions or concerns.

## 2017-09-20 LAB — GLUCOSE, CAPILLARY: Glucose-Capillary: 320 mg/dL — ABNORMAL HIGH (ref 65–99)

## 2017-09-20 MED ORDER — BUSPIRONE HCL 15 MG PO TABS: 15.0000 mg | ORAL_TABLET | Freq: Three times a day (TID) | ORAL | 0 refills | Status: AC

## 2017-09-20 MED ORDER — GABAPENTIN 300 MG PO CAPS: 600.0000 mg | ORAL_CAPSULE | Freq: Three times a day (TID) | ORAL | 0 refills | Status: AC

## 2017-09-20 MED ORDER — ESCITALOPRAM OXALATE 5 MG PO TABS: 5.0000 mg | ORAL_TABLET | Freq: Every day | ORAL | 0 refills | Status: AC

## 2017-09-20 MED ORDER — HYDROXYZINE HCL 25 MG PO TABS: 25.0000 mg | ORAL_TABLET | Freq: Three times a day (TID) | ORAL | 0 refills | Status: AC | PRN

## 2017-09-20 MED ORDER — BENZTROPINE MESYLATE 0.5 MG PO TABS: 0.5000 mg | ORAL_TABLET | Freq: Two times a day (BID) | ORAL | 0 refills | Status: AC

## 2017-09-20 MED ORDER — THIAMINE HCL 100 MG PO TABS: 100.0000 mg | ORAL_TABLET | Freq: Every day | ORAL | 0 refills | Status: AC

## 2017-09-20 MED ORDER — ARIPIPRAZOLE 10 MG PO TABS: 10.0000 mg | ORAL_TABLET | Freq: Every day | ORAL | 0 refills | Status: AC

## 2017-09-20 MED ORDER — INSULIN ASPART 100 UNIT/ML ~~LOC~~ SOLN: 12.0000 [IU] | Freq: Three times a day (TID) | SUBCUTANEOUS | 0 refills | Status: AC

## 2017-09-20 MED ORDER — PANTOPRAZOLE SODIUM 40 MG PO TBEC: 40.0000 mg | DELAYED_RELEASE_TABLET | Freq: Two times a day (BID) | ORAL | 0 refills | Status: AC

## 2017-09-20 MED ORDER — BUSPIRONE HCL 7.5 MG PO TABS
15.0000 mg | ORAL_TABLET | Freq: Three times a day (TID) | ORAL | Status: DC
  Filled 2017-09-20 (×3): qty 42

## 2017-09-20 MED ORDER — POTASSIUM CHLORIDE 20 MEQ/15ML (10%) PO SOLN: 20.0000 meq | Freq: Every day | ORAL | 0 refills | Status: AC

## 2017-09-20 MED ORDER — MIRTAZAPINE 15 MG PO TABS: ORAL_TABLET | ORAL | 0 refills | Status: AC

## 2017-09-20 MED ORDER — METOCLOPRAMIDE HCL 10 MG PO TABS: 10.0000 mg | ORAL_TABLET | Freq: Four times a day (QID) | ORAL | 0 refills | Status: AC | PRN

## 2017-09-20 MED ORDER — INSULIN GLARGINE 100 UNIT/ML ~~LOC~~ SOLN: 60.0000 [IU] | Freq: Two times a day (BID) | SUBCUTANEOUS | 0 refills | Status: AC

## 2017-09-20 MED ORDER — ATORVASTATIN CALCIUM 40 MG PO TABS: 40.0000 mg | ORAL_TABLET | Freq: Every day | ORAL | 0 refills | Status: AC

## 2017-09-20 MED ORDER — FOLIC ACID 1 MG PO TABS: 1.0000 mg | ORAL_TABLET | Freq: Every day | ORAL | 0 refills | Status: AC

## 2017-09-20 MED ORDER — LISINOPRIL 10 MG PO TABS: 10.0000 mg | ORAL_TABLET | Freq: Every day | ORAL | 0 refills | Status: AC

## 2017-09-20 MED ORDER — AMLODIPINE BESYLATE 10 MG PO TABS: 10.0000 mg | ORAL_TABLET | Freq: Every day | ORAL | 0 refills | Status: AC

## 2017-09-20 NOTE — Progress Notes (Signed)
Recreation Therapy Notes  INPATIENT RECREATION TR PLAN  Patient Details Name: Carolyn Ayala MRN: [id number] DOB: Jul 08, 1975 Today's Date: 09/20/2017  Rec Therapy Plan Is patient appropriate for Therapeutic Recreation?: Yes Treatment times per week: At least three  Estimated Length of Stay: 5-7 days  TR Treatment/Interventions: Group participation (Appropriate participation in Recreation Therapy group tx.)  Discharge Criteria Pt will be discharged from therapy if:: Discharged Treatment plan/goals/alternatives discussed and agreed upon by:: Patient/family  Discharge Summary Short term goals set: See Care Plan  Short term goals met: Complete Progress toward goals comments: Groups attended Which groups?: Goal setting, Communication Therapeutic equipment acquired: None  Reason patient discharged from therapy: Discharge from hospital Pt/family agrees with progress & goals achieved: Yes Date patient discharged from therapy: 09/20/17  Ranell Patrick, Recreation Therapy Intern   Ranell Patrick 09/20/2017, 1:07 PM
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
## 2017-09-20 NOTE — [facility] Suicide Risk Assessment (Signed)
[facility]   Principal Problem: Schizophrenia [facility](HCC) Discharge Diagnoses:  Patient Active Problem List   Diagnosis Date Noted  . Tobacco dependence [F17.200] 09/18/2017  . Schizophrenia (HCC) [F20.9] 09/18/2017  . DKA (diabetic ketoacidoses) (HCC) [E13.10] 09/12/2017  . Suicidal thoughts [R45.851]   . Hypokalemia [E87.6]   . HTN (hypertension) [I10] 09/24/2015  . Hyperlipidemia [E78.5] 08/10/2015  . Alcohol use disorder, moderate, dependence (HCC) [F10.20] 08/10/2015  . Neuropathic pain [M79.2] 08/08/2015  . 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 Generalized anxiety disorder [F41.1] 02/13/2013  . Chronic posttraumatic stress disorder [F43.12] 02/13/2013  . IDDM (insulin dependent diabetes mellitus) (HCC) [E11.9, Z79.4] 02/08/2013    Total Time spent with patient: 30 minutes  Musculoskeletal: Strength & Muscle Tone: within normal limits Gait & Station: normal Patient leans: N/A  Psychiatric Specialty Exam: Review of Systems  Constitutional: Negative for chills and fever.  Respiratory: Negative for cough and shortness of breath.   Cardiovascular: Negative for chest pain.  Gastrointestinal: Negative for abdominal pain, heartburn, nausea and vomiting.  Psychiatric/Behavioral: Negative for depression, hallucinations and suicidal ideas. The patient is not nervous/anxious and does not have insomnia.     Blood pressure 104/69, pulse (!) 115, temperature 98.9 F (37.2 C), temperature source Oral, resp. rate 20, height 5\' 2"  (1.575 m), weight 88.5 kg (195 lb), last menstrual period 09/15/2017.Body mass index is 35.67 kg/m.  General Appearance: Casual and Fairly Groomed  Patent attorneyye Contact::  Good  Speech:  Clear and Coherent and Normal Rate  Volume:  Normal  Mood:  Euthymic  Affect:  Appropriate, Congruent and Flat  Thought Process:  Coherent and Goal Directed  Orientation:  Full (Time, Place, and Person)  Thought  Content:  Logical  Suicidal Thoughts:  No  Homicidal Thoughts:  No  Memory:  Immediate;   Fair Recent;   Fair Remote;   Fair  Judgement:  Fair  Insight:  Fair  Psychomotor Activity:  Normal  Concentration:  Good  Recall:  FiservFair  Fund of Knowledge:Fair  Language: Fair  Akathisia:  No  Handed:    AIMS (if indicated):     Assets:  Communication Skills Resilience  Sleep:  Number of Hours: 6.75  Cognition: WNL  ADL's:  Intact   Mental Status Per Nursing Assessment::   On Admission:     Demographic Factors:  Low socioeconomic status, Living alone and Unemployed  Loss Factors: Financial problems/change in socioeconomic status  Historical Factors: Impulsivity  Risk Reduction Factors:   Positive social support, Positive therapeutic relationship and Positive coping skills or problem solving skills  Continued Clinical Symptoms:  Severe Anxiety and/or Agitation Bipolar Disorder:   Mixed State Schizophrenia:   Paranoid or undifferentiated type Medical Diagnoses and Treatments/Surgeries  Cognitive Features That Contribute To Risk:  None    Suicide Risk:  Minimal: No identifiable suicidal ideation.  Patients presenting with no risk factors but with morbid ruminations; may be classified as minimal risk based on the severity of the depressive symptoms  Subjective Data:  This is one of several admission assessments in this hospital alone for this 42 year old AA female with hx of chronic mental illness, non-compliant to both her mental/medical heath treatment regimen. [facility] with reports of worsening symptoms of depression, suicidal ideations & auditory hallucinations telling her to shoot up schools. [facility]. She was restarted on previous medications of abilify, lexapro, remeron, vistaril, and  gabapentin. She will also be resumed on sliding scale insulin for treatment of DMII. Pt  reported improvement of her mood and psychotic symptoms during her stay.  Today upon evaluation, pt shares, "I'm doing great today - I've been on the waitlist for housing for 2 years, and I just found out that I was accepted into an apartment." Pt shares that she has been on housing list through a program in [place], [place] and her immediate goal is to get to [place] as quickly as possible so that she can get into the housing. She denies any other specific concerns. She denies SI/HI/AH/VH. She denies any ongoing CAH, and she states, "If they come back, I know to get some help - call 911 or get myself to the hospital." She is in agreement to continue her current treatment regimen. She was able to engage in safety planning including plan to return to [facility] or contact emergency services if she feels unable to maintain her own safety or the safety of others. Pt had no further questions, comments, or concerns.   Plan Of Care/Follow-up recommendations:   -Discharge to outpatient level of care  -Schizophrenia             -Continue abilify 10mg  po qday             -Continue lexapro 5mg  po qday             - Continue remeron 15mg  po qday  -Anxiety             -Continue gabapentin 600mg  po TID             -Continue vistaril 25mg  po TID prn anxiety             -Continue buspar 15mg  po TID  -HTN             - Continue norvasc 10mg  po qDay             -Continue lisinopril 10mg  po qDay  -HLD             - Continue lipitor 40mg  po qDay  -EPS             - Continue cogentin 0.5mg  po BID  - DMII             -Continue SSI with novolog and Lantus 50mg  [place] [facility]  Activity:  as tolerated Diet:  normal Tests:  NA Other:  see above for DC plan  Micheal Likens, MD 09/20/2017, 9:16 AM

## 2017-09-20 NOTE — Discharge Summary (Addendum)
Physician Discharge Summary Note  Patient:  Carolyn Ayala is an 42 y.o., female  MRN:  [id number]  DOB:  [birth year]  Patient phone:  [phone] (home)   Patient address:   [address] [place] [address],   Total Time spent with patient: Greater than 30 minutes  Date of Admission:  09/18/2017  Date of Discharge: 09-20-17  Reason for Admission: Worsening symptoms depression with suicidal ideations & psychosis.  Principal Problem: Schizophrenia [facility])  Discharge Diagnoses: Patient Active Problem List   Diagnosis Date Noted  . Schizophrenia (HCC) [F20.9] 09/18/2017    Priority: High  . Tobacco dependence [F17.200] 09/18/2017  . DKA (diabetic ketoacidoses) (HCC) [E13.10] 09/12/2017  . Suicidal thoughts [R45.851]   . Hypokalemia [E87.6]   . HTN (hypertension) [I10] 09/24/2015  . Hyperlipidemia [E78.5] 08/10/2015  . Alcohol use disorder, moderate, dependence (HCC) [F10.20] 08/10/2015  . Neuropathic pain [M79.2] 08/08/2015  . 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 Generalized anxiety disorder [F41.1] 02/13/2013  . Chronic posttraumatic stress disorder [F43.12] 02/13/2013  . IDDM (insulin dependent diabetes mellitus) (HCC) [E11.9, Z79.4] 02/08/2013   Past Psychiatric History: Major depression.  Past Medical History:  Past Medical History:  Diagnosis Date  . Depression   . Diabetes mellitus without complication (HCC)   . High cholesterol   . Hypertension   . Mental disorder   . Noncompliance     Past Surgical History:  Procedure Laterality Date  . CESAREAN SECTION     Family History:  Family History  Problem Relation Age of Onset  . Depression Mother    Family Psychiatric  History: See H&P.  Social History:  Social History   Substance and Sexual Activity  Alcohol Use Yes   Comment:  Daily use -- 2 x 1/5 daily     Social History   Substance and Sexual Activity  Drug Use Yes  . Frequency: 7.0 times  per week  . Types: Barbituates, Benzodiazepines   Comment: Pt denied to Chartered loss adjuster    Social History   Socioeconomic History  . Marital status: Single    Spouse name: Not on file  . Number of children: Not on file  . Years of education: Not on file  . Highest education level: Not on file  Occupational History  . Not on file  Social Needs  . Financial resource strain: Not on file  . Food insecurity:    Worry: Not on file    Inability: Not on file  . Transportation needs:    Medical: Not on file    Non-medical: Not on file  Tobacco Use  . Smoking status: Current Every Day Smoker    Packs/day: 2.00    Start date: 69  . Smokeless tobacco: Current User  Substance and Sexual Activity  . Alcohol use: Yes    Comment:  Daily use -- 2 x 1/5 daily  . Drug use: Yes    Frequency: 7.0 times per week    Types: Barbituates, Benzodiazepines    Comment: Pt denied to author  . Sexual activity: Never  Lifestyle  . Physical activity:    Days per week: Not on file    Minutes per session: Not on file  . Stress: Not on file  Relationships  . Social connections:    Talks on phone: Not on file    Gets together: Not on file    Attends religious service: Not on file    Active member of club or organization: Not  on file    Attends meetings of clubs or organizations: Not on file    Relationship status: Not on file  Other Topics Concern  . Not on file  Social History Narrative   ** [facility]       Hospital Course: (Per Md's SRA): This is one of several discharge summaries from this hospital alone for this [age] year old AA female with hx of chronic mental illness, non-compliant to both her mental/medical heath medication regimen. [facility] with reports of worsening symptoms of depression, suicidal ideations & auditory hallucinations telling her to shoot up schools. [facility]. After her  admission assessment, Carolyn Ayala was restarted on her previous medications of Abilify, Lexapro, Remeron, Vistaril, and gabapentin. She was also resumed on her sliding scale insulin for treatment of DMII. Pt reported improvement of her mood and psychotic symptoms during her short stay.    Besides the re-initiation of her mental health medications & those for her pre-existing medical conditiions, Carolyn Ayala was also enrolled & briefly participated in the group counseling sessions being offered & held on this unit. She was counseled & learned coping skills that should help her cope better & maintain mood stability after discharge. She tolerated her treatment regimen without any adverse effects reported.  Today upon follow-up care evaluation with the attending psychiatrist, pt shares, "I'm doing great today - I've been on the waitlist for housing for 2 years, and I just found out that I was accepted into an apartment." Pt shares that she has been on housing list through a program in [place] and her immediate goal is to get to [place] as quickly as possible so that she can get into the housing. She denies any other specific concerns. She denies SI/HI/AH/VH. She denies any ongoing CAH, and she states, "If they come back, I know to get some help - call 911 or get myself to the hospital." She is in agreement to continue her current treatment regimen. She was able to engage in safety planning including [facility] or contact emergency services if she feels unable to maintain her own safety or the safety of others. Pt had no further questions, comments, or concerns. She will be discharged as she has requested. Upon discharge, Carolyn Ayala was both mentally & medically stable. She is currently denying suicidal, homicidal ideation, auditory, visual/tactile hallucinations, delusional thoughts & or paranoia. [facility] in no apparent distress. Transportation per bus transportation  service. BHH assisted with bus pass.       Physical Findings: AIMS: Facial and Oral Movements Muscles of Facial Expression: None, normal Lips and Perioral Area: None, normal Jaw: None, normal Tongue: None, normal,Extremity Movements Upper (arms, wrists, hands, fingers): None, normal Lower (legs, knees, ankles, toes): None, normal, Trunk Movements Neck, shoulders, hips: None, normal, Overall Severity Severity of abnormal movements (highest score from questions above): None, normal Incapacitation due to abnormal movements: None, normal Patient's awareness of abnormal movements (rate only patient's report): No Awareness, Dental Status Current problems with teeth and/or dentures?: No Does patient usually wear dentures?: No  CIWA:    COWS:     Musculoskeletal: Strength & Muscle Tone: within normal limits Gait & Station: normal Patient leans: N/A  Psychiatric Specialty Exam: Physical Exam  Constitutional: She appears well-developed.  HENT:  Head: Normocephalic.  Eyes: Pupils are equal, round, and reactive to light.  Neck: Normal range of  motion.  Cardiovascular: Normal rate.  Hx. HTN  Respiratory: Effort normal.  GI: Soft.  Genitourinary:  Genitourinary Comments: Deferred  Musculoskeletal: Normal range of motion.  Neurological: She is alert.  Skin: Skin is warm.    Review of Systems  Constitutional: Negative.   HENT: Negative.   Eyes: Negative.   Respiratory: Negative.   Cardiovascular: Negative.        Hx. HTN  Gastrointestinal: Negative.   Genitourinary: Negative.   Musculoskeletal: Negative.   Skin: Negative.   Neurological: Negative.   Endo/Heme/Allergies: Negative.   Psychiatric/Behavioral: Positive for depression (Stable prior to discharge), hallucinations (Hx. auditory hallucinations (Stable)) and substance abuse (Hx. alcohol use disorder). Negative for memory loss and suicidal ideas. The patient has insomnia (Stable prior to discharge). The patient is not  nervous/anxious.     Blood pressure 104/69, pulse (!) 115, temperature 98.9 F (37.2 C), temperature source Oral, resp. rate 20, height 5\' 2"  (1.575 m), weight 88.5 kg (195 lb), last menstrual period 09/15/2017.Body mass index is 35.67 kg/m.  See Md's SRA   Have you used any form of tobacco in the last 30 days? (Cigarettes, Smokeless Tobacco, Cigars, and/or Pipes): Yes  Has this patient used any form of tobacco in the last 30 days? (Cigarettes, Smokeless Tobacco, Cigars, and/or Pipes): NA  Blood Alcohol level:  Lab Results  Component Value Date   ETH <10 09/16/2017   ETH <10 09/11/2017   Metabolic Disorder Labs:  Lab Results  Component Value Date   HGBA1C 11.0 (H) 09/19/2017   MPG 269 09/19/2017   MPG 286 08/04/2017   Lab Results  Component Value Date   PROLACTIN 10.7 04/28/2015   Lab Results  Component Value Date   CHOL 248 (H) 08/07/2015   TRIG 195 (H) 08/07/2015   HDL 83 08/07/2015   CHOLHDL 3.0 08/07/2015   VLDL 39 08/07/2015   LDLCALC 126 (H) 08/07/2015   LDLCALC 132 (H) 04/29/2015   See Psychiatric Specialty Exam and Suicide Risk Assessment completed by Attending Physician prior to discharge.  Discharge destination:  Home  Is patient on multiple antipsychotic therapies at discharge:  No   Has Patient had three or more failed trials of antipsychotic monotherapy by history:  No  Recommended Plan for Multiple Antipsychotic Therapies: NA  Allergies as of 09/20/2017   No Known Allergies     Medication List    STOP taking these medications   Potassium Chloride ER 20 MEQ Tbcr Replaced by:  potassium chloride 20 MEQ/15ML (10%) Soln     TAKE these medications     Indication  amLODipine 10 MG tablet Commonly known as:  NORVASC Take 1 tablet (10 mg total) by mouth daily. For high blood pressure What changed:  additional instructions  Indication:  High Blood Pressure Disorder   ARIPiprazole 10 MG tablet Commonly known as:  ABILIFY Take 1 tablet (10 mg  total) by mouth daily. For mood control Start taking on:  09/21/2017 What changed:    how much to take  additional instructions  Indication:  Mood control   atorvastatin 40 MG tablet Commonly known as:  LIPITOR Take 1 tablet (40 mg total) by mouth daily. For high cholesterol What changed:  additional instructions  Indication:  Inherited Homozygous Hypercholesterolemia, High Amount of Fats in the Blood   benztropine 0.5 MG tablet Commonly known as:  COGENTIN Take 1 tablet (0.5 mg total) by mouth 2 (two) times daily. For EPS What changed:  additional instructions  Indication:  Extrapyramidal Reaction caused  by Medications   busPIRone 15 MG tablet Commonly known as:  BUSPAR Take 1 tablet (15 mg total) by mouth 3 (three) times daily. For anxiety  Indication:  Anxiety Disorder   escitalopram 5 MG tablet Commonly known as:  LEXAPRO Take 1 tablet (5 mg total) by mouth daily. For depression Start taking on:  09/21/2017  Indication:  Major Depressive Disorder   folic acid 1 MG tablet Commonly known as:  FOLVITE Take 1 tablet (1 mg total) by mouth daily. For folate replacement What changed:  additional instructions  Indication:  Anemia From Inadequate Folic Acid   gabapentin 300 MG capsule Commonly known as:  NEURONTIN Take 2 capsules (600 mg total) by mouth 3 (three) times daily. For agitation/diabetic neuropathy What changed:  additional instructions  Indication:  Agitation, Neuropathic Pain   hydrOXYzine 25 MG tablet Commonly known as:  ATARAX/VISTARIL Take 1 tablet (25 mg total) by mouth 3 (three) times daily as needed for anxiety.  Indication:  Feeling Anxious   insulin aspart 100 UNIT/ML injection Commonly known as:  NOVOLOG Inject 12 Units into the skin 3 (three) times daily before meals. For diabetes management What changed:  additional instructions  Indication:  Type 2 Diabetes   insulin glargine 100 UNIT/ML injection Commonly known as:  LANTUS Inject 0.6 mLs (60  Units total) into the skin 2 (two) times daily. For diabetes management What changed:  additional instructions  Indication:  Type 2 Diabetes   lisinopril 10 MG tablet Commonly known as:  PRINIVIL,ZESTRIL Take 1 tablet (10 mg total) by mouth daily. For high blood pressure Start taking on:  09/21/2017  Indication:  High Blood Pressure Disorder   metoCLOPramide 10 MG tablet Commonly known as:  REGLAN Take 1 tablet (10 mg total) by mouth every 6 (six) hours as needed for nausea. What changed:  additional instructions  Indication:  Nausea and Vomiting   mirtazapine 15 MG tablet Commonly known as:  REMERON Take 1 tablet (15 mg) by mouth at bedtime: For depression/sleep What changed:    how much to take  how to take this  when to take this  additional instructions  Indication:  Major Depressive Disorder, Insomnia   pantoprazole 40 MG tablet Commonly known as:  PROTONIX Take 1 tablet (40 mg total) by mouth 2 (two) times daily with a meal. For acid reflux  Indication:  Gastroesophageal Reflux Disease   potassium chloride 20 MEQ/15ML (10%) Soln Take 15 mLs (20 mEq total) by mouth daily. For potassium replacement Start taking on:  09/21/2017 Replaces:  Potassium Chloride ER 20 MEQ Tbcr  Indication:  Low Amount of Potassium in the Blood   thiamine 100 MG tablet Take 1 tablet (100 mg total) by mouth daily. For thiamine replacement What changed:  additional instructions  Indication:  Deficiency of Vitamin B1      Follow-up Information    [facility] Follow up.   Why:  Go within 3 days of d/c to the walk-in clinic for your hospital follow up appointment.  Take along your hospital d/c paperwork Contact information: [address] [place] [address] [phone]          Follow-up recommendations: Activity:  As tolerated Diet: As recommended by your primary care doctor. Keep all scheduled follow-up appointments as recommended.    Comments: Patient is instructed prior to  discharge to: Take all medications as prescribed by his/her mental healthcare provider. Report any adverse effects and or reactions from the medicines to his/her outpatient provider promptly. Patient  has been instructed & cautioned: To not engage in alcohol and or illegal drug use while on prescription medicines. In the event of worsening symptoms, patient is instructed to call the crisis hotline, 911 and or go to the nearest ED for appropriate evaluation and treatment of symptoms. To follow-up with his/her primary care provider for your other medical issues, concerns and or health care needs.   Signed: Armandina Stammer, NP, PMHNP, FNP-BC 09/20/2017, 9:54 AM   Patient seen, Suicide Assessment Completed.  Disposition Plan Reviewed

## 2017-09-20 NOTE — Progress Notes (Signed)
  [facility] Discharge [facility] :  Will you be returning to the same living situation after discharge:  Yes,  home At discharge, do you have transportation home?: Yes,  bus pass Do you have the ability to pay for your medications: Yes,  Mental Health  Release of information consent forms completed and in the chart;  Patient's signature needed at discharge.  Patient to Follow up at: Follow-up Information    [facility] Follow up.   Why:  Go within 3 days of d/c to the walk-in clinic for your hospital follow up appointment.  Take along your hospital d/c paperwork Contact information: [address] [place] [address] [phone]           Next level of care provider has access to [facility] Link:no  Safety Planning and Suicide Prevention discussed: Yes,  yes  Have you used any form of tobacco in the last 30 days? (Cigarettes, Smokeless Tobacco, Cigars, and/or Pipes): Yes  Has patient been referred to the Quitline?: Patient refused referral  Patient has been referred for addiction treatment: Pt. refused referral  Ida RogueRodney B Julias Mould, LCSW 09/20/2017, 9:53 AM

## 2017-09-20 NOTE — Plan of Care (Signed)
4.10.19. Patient attended and participated appropriately during Recreation Therapy group session engaging in groups with a calm and appropriate mood at least 2x within 5 Recreation Therapy group sessions

## 2017-12-02 IMAGING — DX DG ABDOMEN ACUTE W/ 1V CHEST
3 series · 3 of 3 positions shown · non-contrast
Comparison: Chest radiograph performed 04/16/2015

CLINICAL DATA: Acute onset of vomiting.  Initial encounter.

EXAM:
DG ABDOMEN ACUTE W/ 1V CHEST

[abdomen supine]
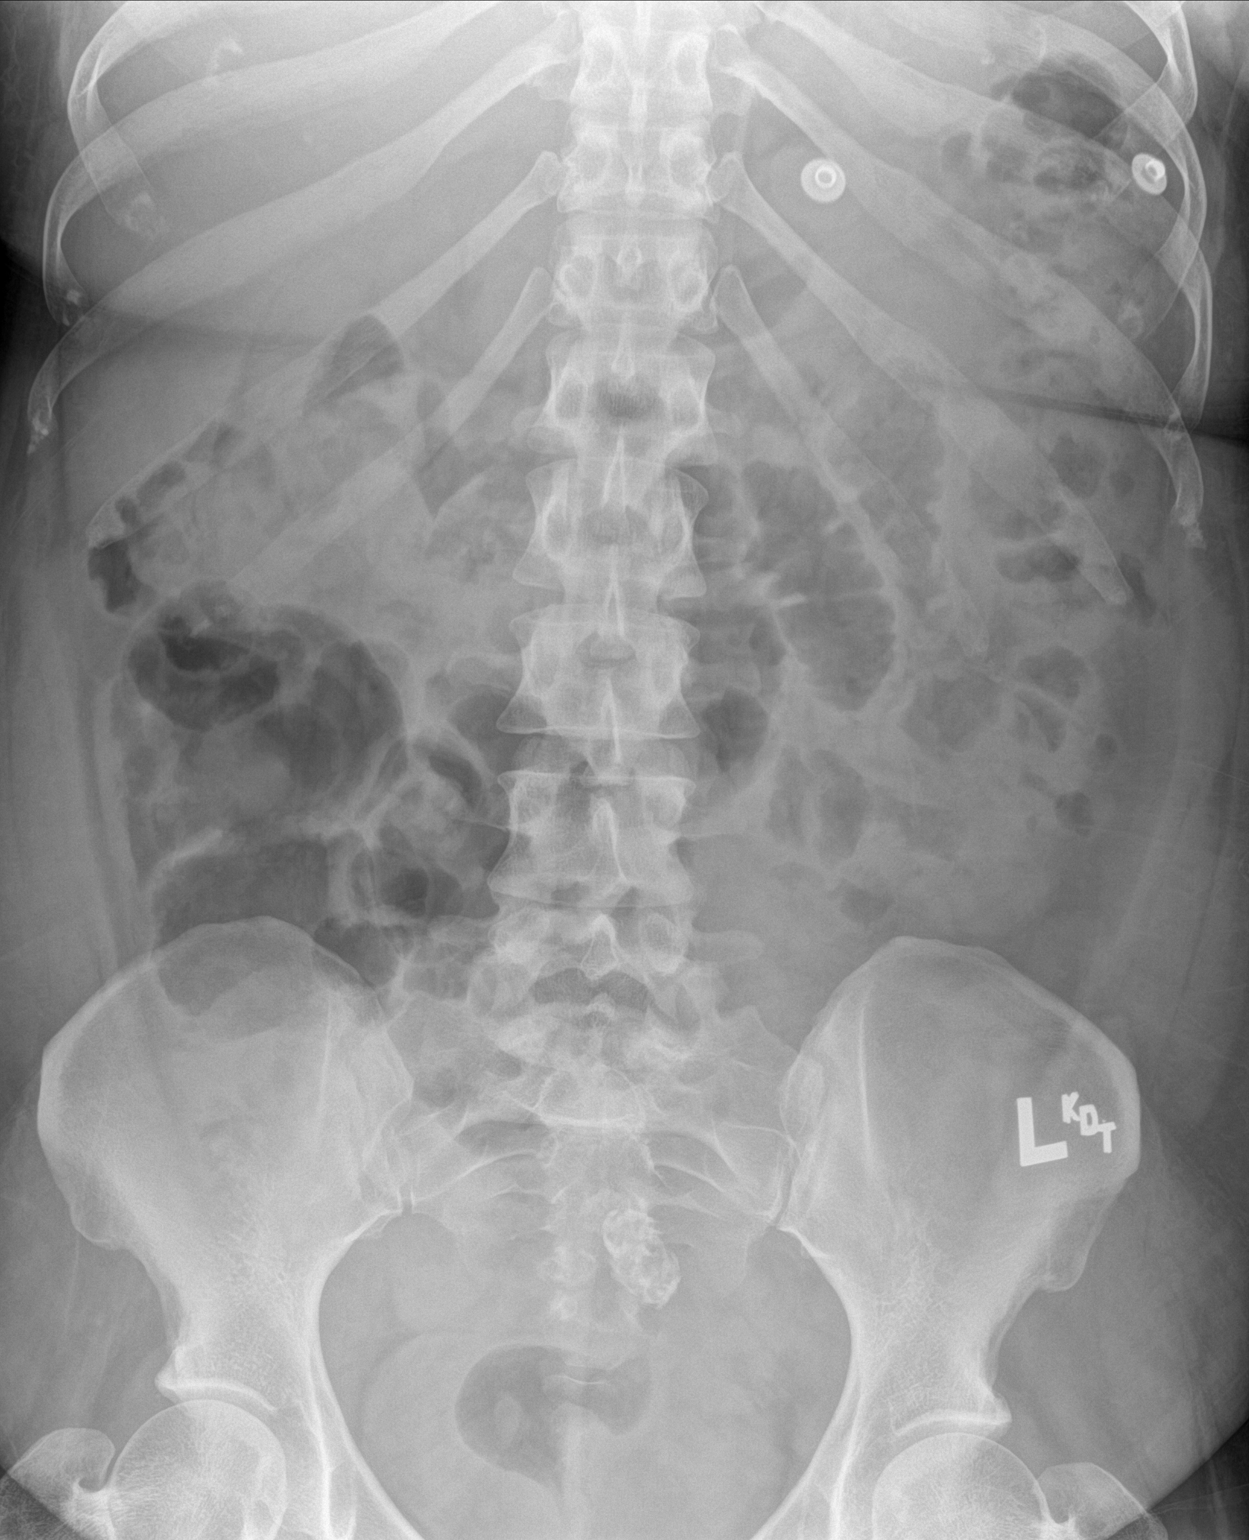

[chest ap]
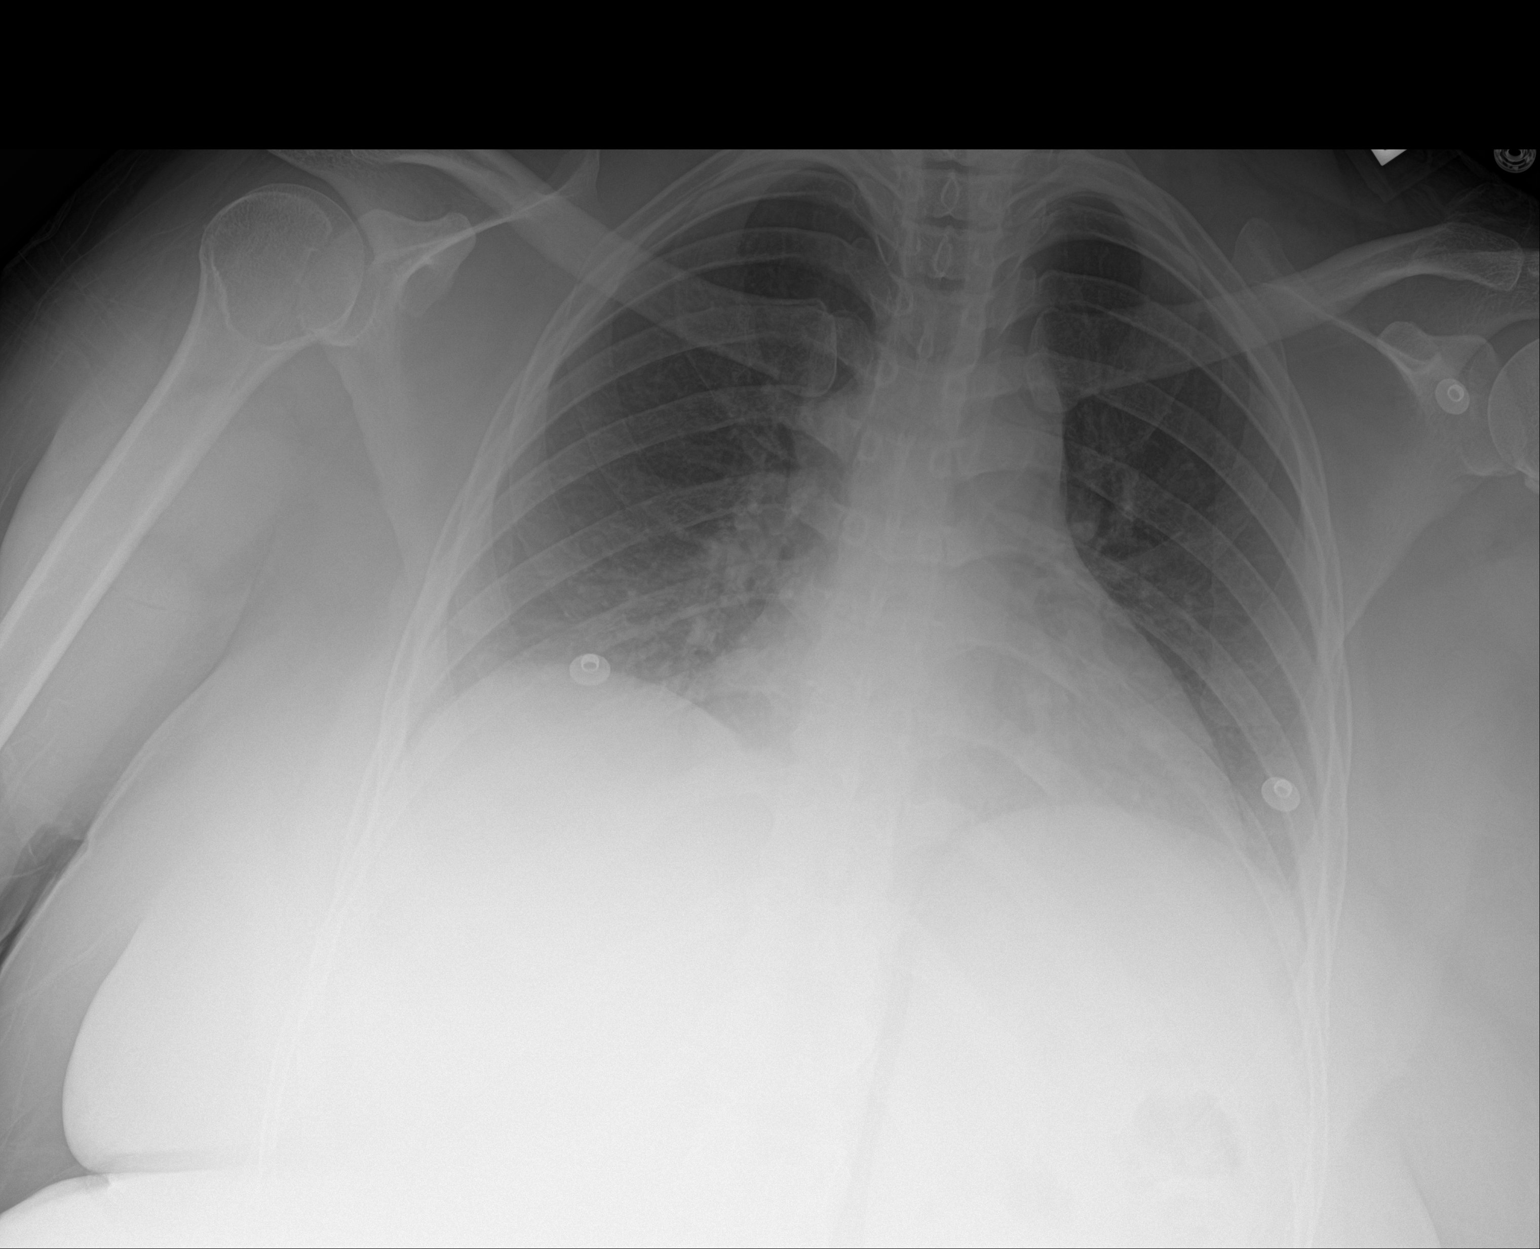

[abdomen decu]
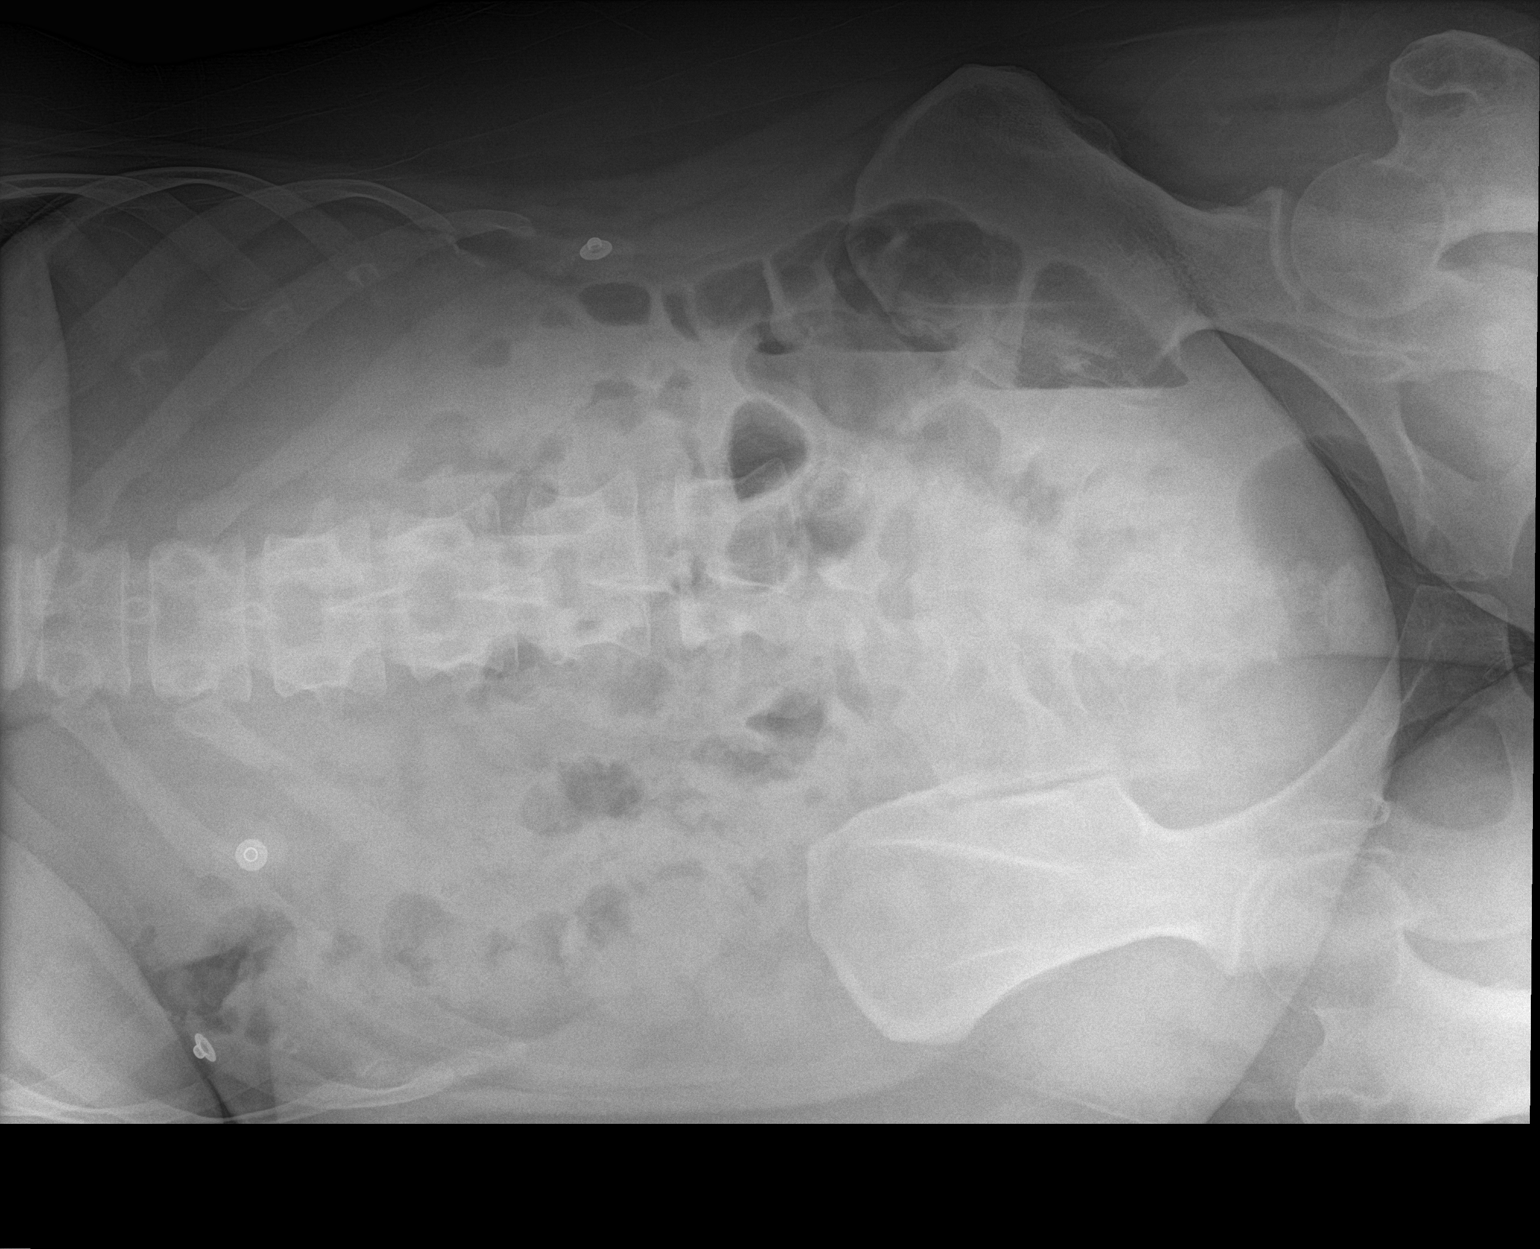

[3 of 3 positions shown; findings below may reference images not displayed]

FINDINGS: The lungs are well-aerated and clear. There is no evidence of focal
opacification, pleural effusion or pneumothorax. The
cardiomediastinal silhouette is borderline enlarged.

The visualized bowel gas pattern is unremarkable. Scattered stool
and air are seen within the colon; there is no evidence of small
bowel dilatation to suggest obstruction. No free intra-abdominal air
is identified on the provided decubitus view.

No acute osseous abnormalities are seen; the sacroiliac joints are
unremarkable in appearance. A calcified uterine fibroid is seen.
IMPRESSION: 1. Unremarkable bowel gas pattern; no free intra-abdominal air seen.
Moderate amount of stool noted in the colon.
2. Borderline cardiomegaly.  No acute cardiopulmonary process seen.

## 2020-02-07 IMAGING — CR DG CHEST 2V
2 series · 2 of 2 positions shown · non-contrast
Comparison: 09/24/2015 chest radiograph

CLINICAL DATA: 41 y/o  F; couple hours of chest pain.

EXAM:
CHEST - 2 VIEW

[chest ap]
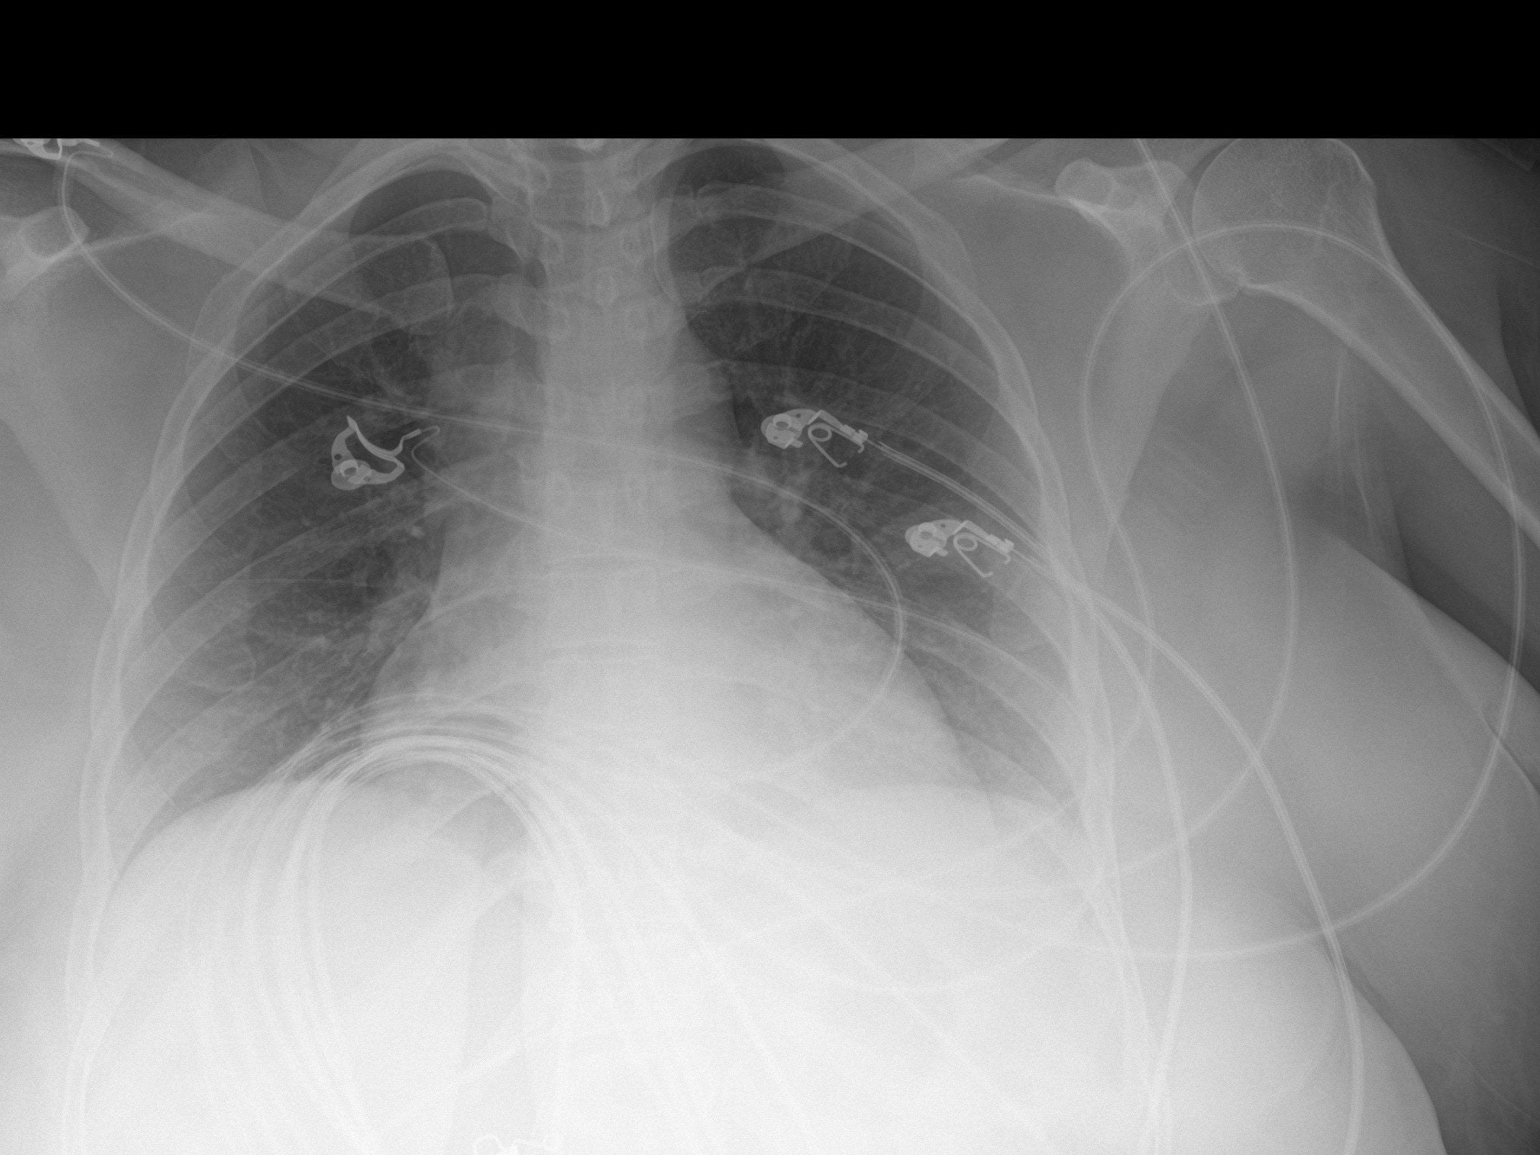

[chest lat]
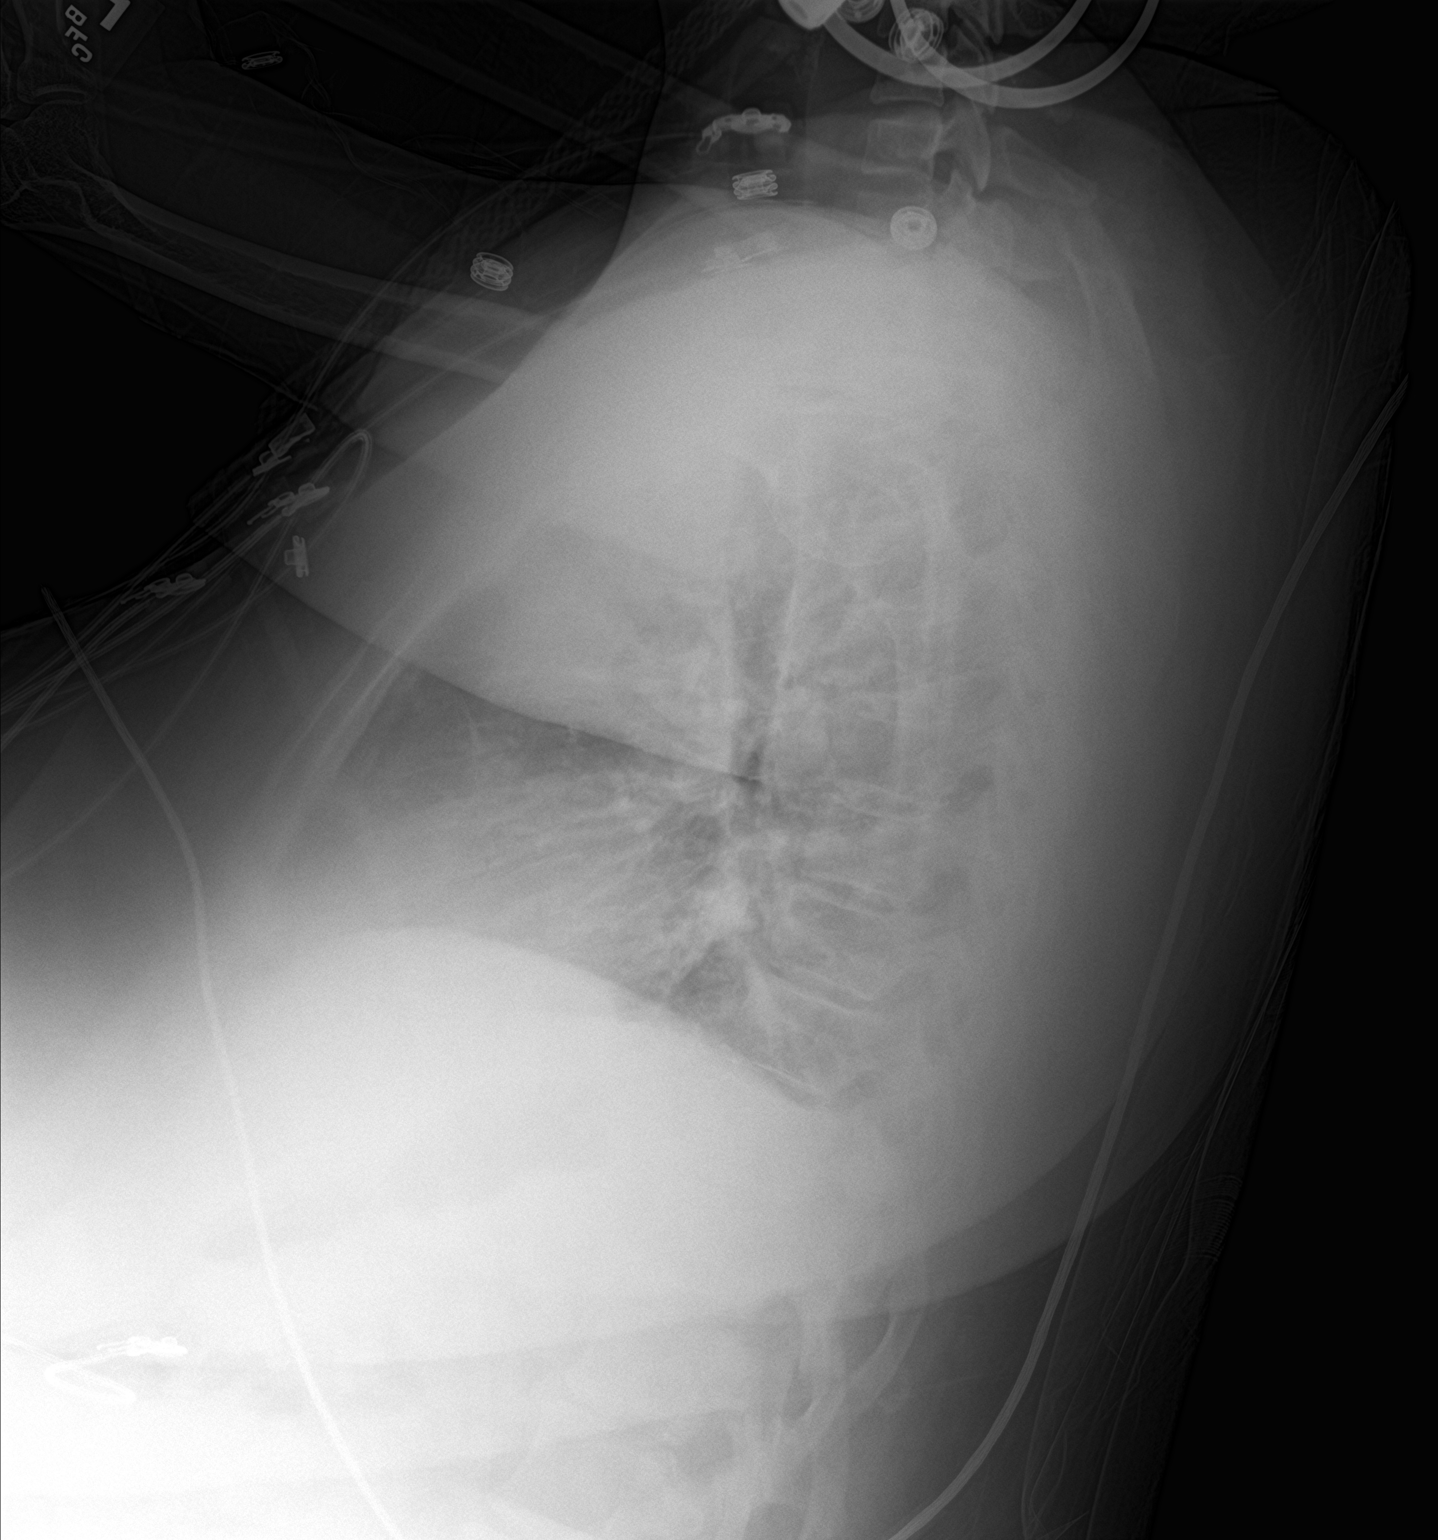

[2 of 2 positions shown; findings below may reference images not displayed]

FINDINGS: Stable cardiac silhouette within normal limits given projection and
technique. Clear lungs. No pleural effusion or pneumothorax. No
acute osseous abnormality is evident.
IMPRESSION: No acute pulmonary process identified.

By: Amrish Homer M.D.

## 2020-03-13 DEATH — deceased
